# Patient Record
Sex: Male | Born: 1950 | Race: Black or African American | Hispanic: No | Smoking: Current every day smoker
Health system: Southern US, Community
[De-identification: ages and names within clinical notes are randomized; demographics above are authoritative.]

## PROBLEM LIST (undated history)

## (undated) DIAGNOSIS — C801 Malignant (primary) neoplasm, unspecified: Secondary | ICD-10-CM

## (undated) DIAGNOSIS — J189 Pneumonia, unspecified organism: Secondary | ICD-10-CM

## (undated) DIAGNOSIS — N189 Chronic kidney disease, unspecified: Secondary | ICD-10-CM

## (undated) DIAGNOSIS — K08109 Complete loss of teeth, unspecified cause, unspecified class: Secondary | ICD-10-CM

## (undated) DIAGNOSIS — G4733 Obstructive sleep apnea (adult) (pediatric): Secondary | ICD-10-CM

## (undated) DIAGNOSIS — K000 Anodontia: Secondary | ICD-10-CM

## (undated) DIAGNOSIS — D709 Neutropenia, unspecified: Secondary | ICD-10-CM

## (undated) DIAGNOSIS — R569 Unspecified convulsions: Secondary | ICD-10-CM

## (undated) HISTORY — DX: Unspecified convulsions: R56.9

## (undated) HISTORY — PX: TONSILLECTOMY: SUR1361

## (undated) HISTORY — DX: Complete loss of teeth, unspecified cause, unspecified class: K08.109

## (undated) HISTORY — DX: Pneumonia, unspecified organism: J18.9

## (undated) HISTORY — DX: Neutropenia, unspecified: D70.9

## (undated) HISTORY — DX: Obstructive sleep apnea (adult) (pediatric): G47.33

## (undated) HISTORY — DX: Anodontia: K00.0

---

## 2001-10-13 ENCOUNTER — Ambulatory Visit (HOSPITAL_COMMUNITY): Admission: RE | Admit: 2001-10-13 | Discharge: 2001-10-13 | Payer: Self-pay | Admitting: Family Medicine

## 2001-10-13 ENCOUNTER — Encounter: Payer: Self-pay | Admitting: Family Medicine

## 2001-10-19 ENCOUNTER — Encounter: Payer: Self-pay | Admitting: Urology

## 2001-10-19 ENCOUNTER — Ambulatory Visit (HOSPITAL_COMMUNITY): Admission: RE | Admit: 2001-10-19 | Discharge: 2001-10-19 | Payer: Self-pay | Admitting: Urology

## 2002-03-30 ENCOUNTER — Ambulatory Visit (HOSPITAL_COMMUNITY): Admission: RE | Admit: 2002-03-30 | Discharge: 2002-03-30 | Payer: Self-pay | Admitting: General Surgery

## 2004-10-20 ENCOUNTER — Ambulatory Visit (HOSPITAL_COMMUNITY): Admission: RE | Admit: 2004-10-20 | Discharge: 2004-10-20 | Payer: Self-pay | Admitting: Family Medicine

## 2004-10-21 ENCOUNTER — Ambulatory Visit (HOSPITAL_COMMUNITY): Admission: RE | Admit: 2004-10-21 | Discharge: 2004-10-21 | Payer: Self-pay | Admitting: Family Medicine

## 2004-11-11 ENCOUNTER — Ambulatory Visit (HOSPITAL_COMMUNITY): Admission: RE | Admit: 2004-11-11 | Discharge: 2004-11-11 | Payer: Self-pay | Admitting: Cardiothoracic Surgery

## 2004-12-16 ENCOUNTER — Ambulatory Visit (HOSPITAL_COMMUNITY): Admission: RE | Admit: 2004-12-16 | Discharge: 2004-12-16 | Payer: Self-pay | Admitting: Cardiothoracic Surgery

## 2005-03-16 ENCOUNTER — Ambulatory Visit (HOSPITAL_COMMUNITY): Admission: RE | Admit: 2005-03-16 | Discharge: 2005-03-16 | Payer: Self-pay | Admitting: Family Medicine

## 2005-07-14 IMAGING — CR DG CHEST 2V
2 series · 2 of 2 positions shown · non-contrast
Comparison: none

HISTORY: Dyspnea, pulmonary infiltrate, followup

CHEST 2 VIEWS:
Comparison 11/11/2004, 10/20/2004
Upper normal heart size.
Normal mediastinal contours and vascularity.
Resolved right upper lobe infiltrate.
Underlying COPD.
No effusion or pneumothorax.
Old left rib fractures.

[view not recorded (1 of 2)]
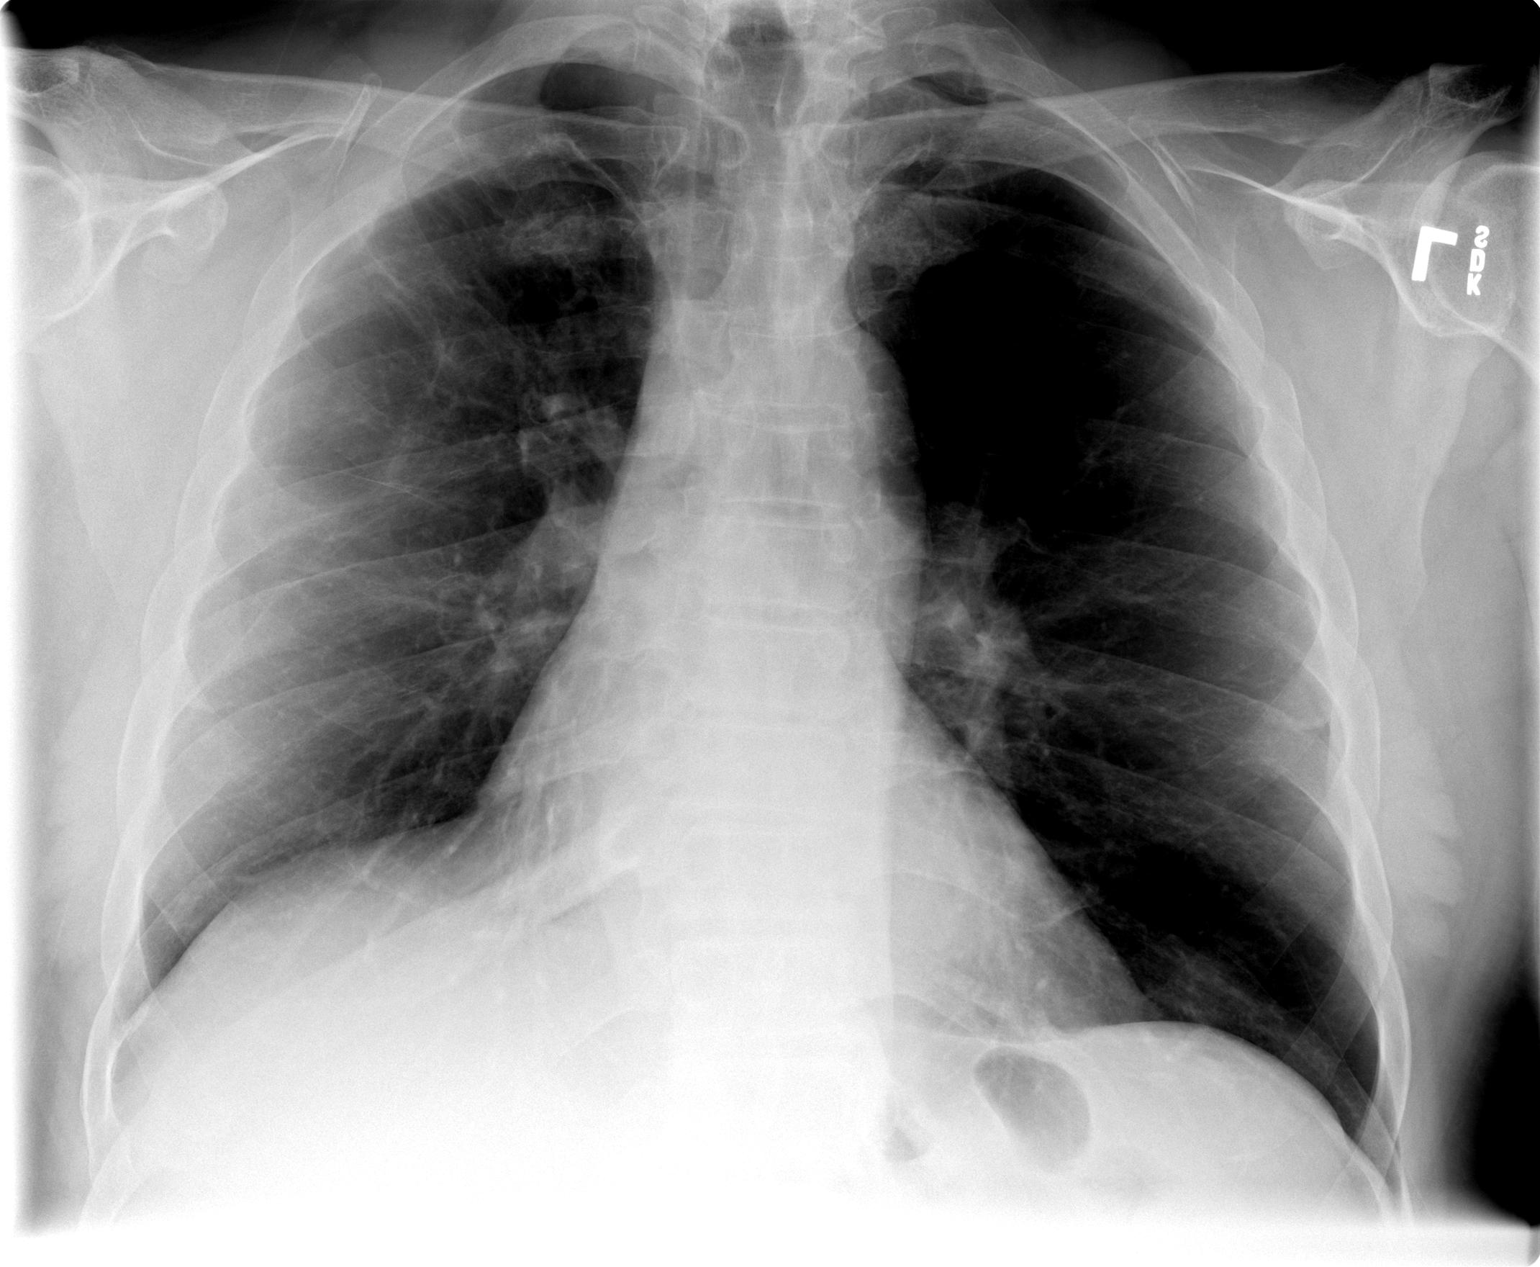

[view not recorded (2 of 2)]
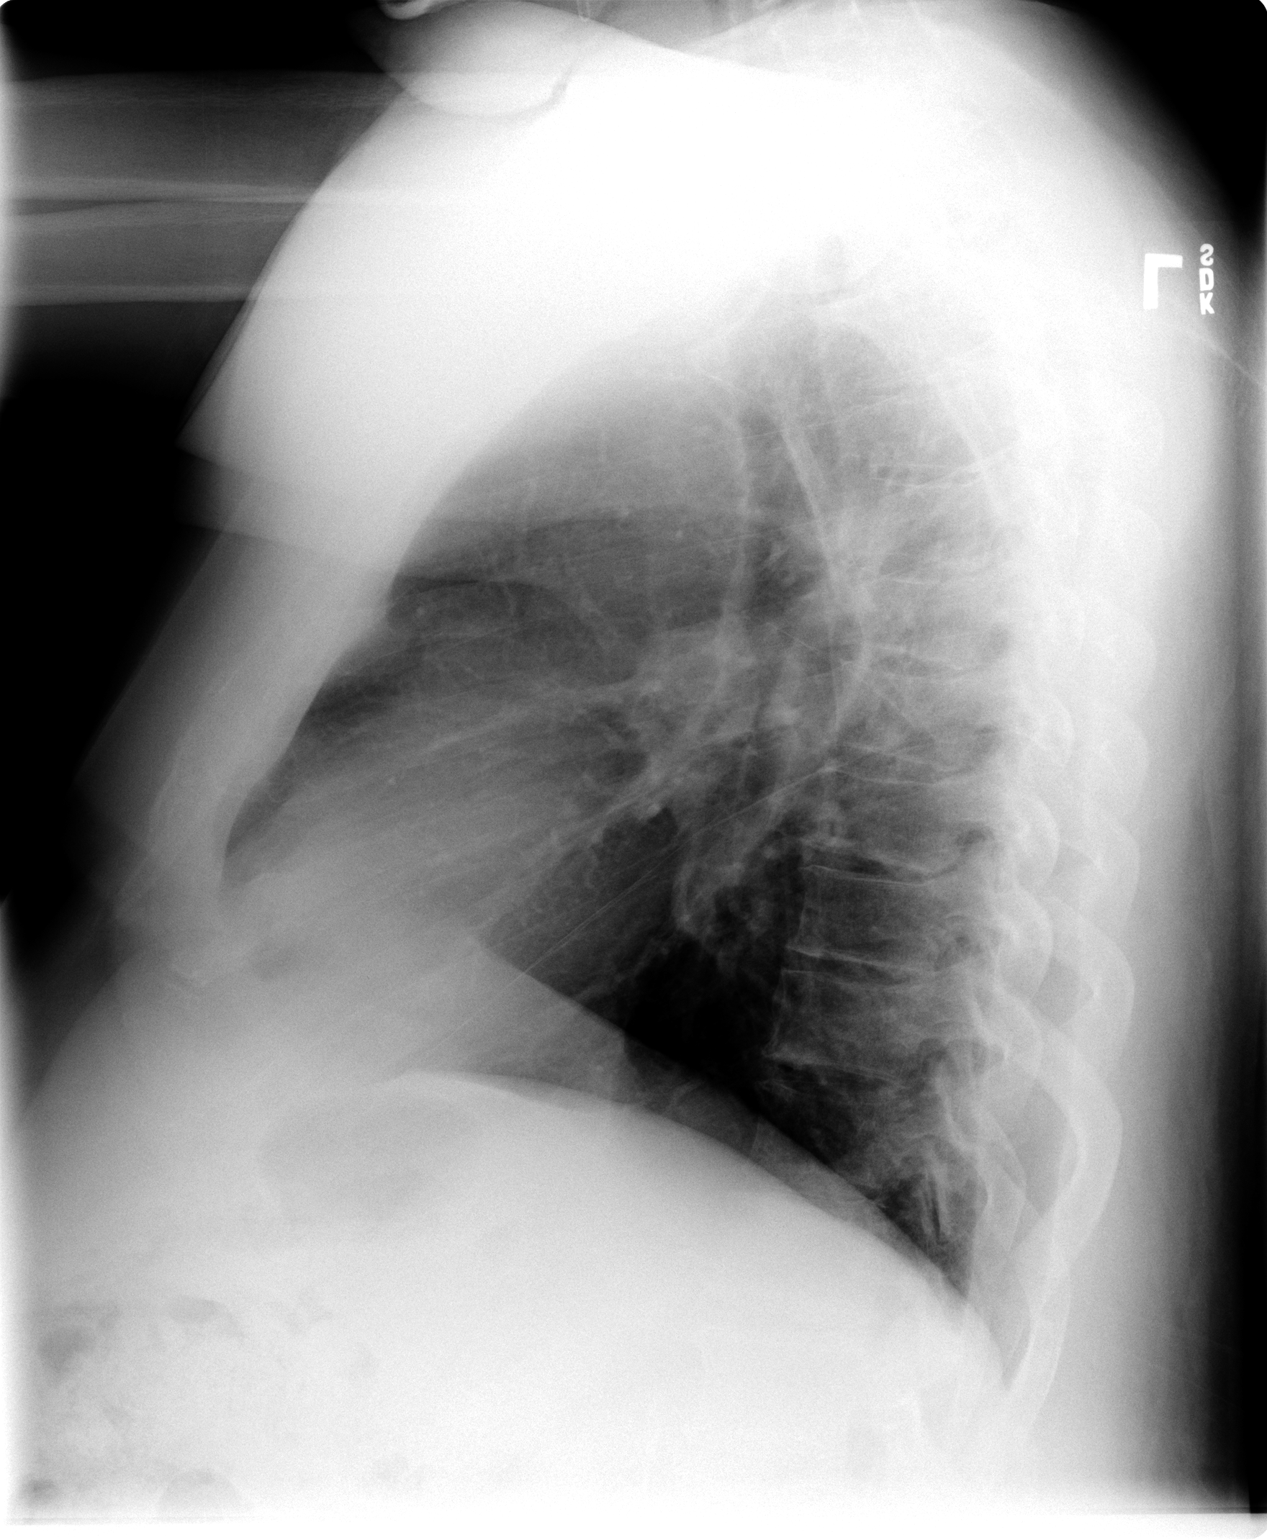

[2 of 2 positions shown; findings below may reference images not displayed]

IMPRESSION: Resolved right upper lobe infiltrate with residual scarring.
Underlying COPD.

## 2009-02-06 ENCOUNTER — Ambulatory Visit (HOSPITAL_COMMUNITY): Admission: RE | Admit: 2009-02-06 | Discharge: 2009-02-06 | Payer: Self-pay | Admitting: Family Medicine

## 2009-04-03 ENCOUNTER — Encounter (HOSPITAL_COMMUNITY): Admission: RE | Admit: 2009-04-03 | Discharge: 2009-05-03 | Payer: Self-pay | Admitting: Oncology

## 2009-04-03 ENCOUNTER — Ambulatory Visit (HOSPITAL_COMMUNITY): Payer: Self-pay | Admitting: Oncology

## 2009-09-30 ENCOUNTER — Ambulatory Visit (HOSPITAL_COMMUNITY): Payer: Self-pay | Admitting: Oncology

## 2009-09-30 ENCOUNTER — Encounter (HOSPITAL_COMMUNITY): Admission: RE | Admit: 2009-09-30 | Discharge: 2009-10-30 | Payer: Self-pay | Admitting: Oncology

## 2010-04-01 ENCOUNTER — Ambulatory Visit (HOSPITAL_COMMUNITY): Payer: Self-pay | Admitting: Oncology

## 2010-04-01 ENCOUNTER — Encounter (HOSPITAL_COMMUNITY): Admission: RE | Admit: 2010-04-01 | Discharge: 2010-05-01 | Payer: Self-pay | Admitting: Oncology

## 2010-08-21 ENCOUNTER — Ambulatory Visit (HOSPITAL_COMMUNITY)
Admission: RE | Admit: 2010-08-21 | Discharge: 2010-08-21 | Payer: Self-pay | Source: Home / Self Care | Attending: Family Medicine | Admitting: Family Medicine

## 2010-10-05 ENCOUNTER — Encounter: Payer: Self-pay | Admitting: Cardiothoracic Surgery

## 2010-10-05 ENCOUNTER — Encounter: Payer: Self-pay | Admitting: Family Medicine

## 2010-11-29 LAB — CBC
HCT: 43.7 % (ref 39.0–52.0)
MCH: 34.7 pg — ABNORMAL HIGH (ref 26.0–34.0)
MCHC: 34.3 g/dL (ref 30.0–36.0)
MCV: 101.2 fL — ABNORMAL HIGH (ref 78.0–100.0)
RDW: 14.4 % (ref 11.5–15.5)

## 2010-11-29 LAB — DIFFERENTIAL
Basophils Absolute: 0 10*3/uL (ref 0.0–0.1)
Eosinophils Relative: 7 % — ABNORMAL HIGH (ref 0–5)
Lymphocytes Relative: 57 % — ABNORMAL HIGH (ref 12–46)
Neutro Abs: 0.7 10*3/uL — ABNORMAL LOW (ref 1.7–7.7)

## 2010-11-30 LAB — DIFFERENTIAL
Basophils Absolute: 0.1 10*3/uL (ref 0.0–0.1)
Basophils Relative: 1 % (ref 0–1)
Eosinophils Absolute: 0.3 10*3/uL (ref 0.0–0.7)
Lymphocytes Relative: 60 % — ABNORMAL HIGH (ref 12–46)
Neutrophils Relative %: 13 % — ABNORMAL LOW (ref 43–77)

## 2010-11-30 LAB — CBC
Hemoglobin: 15.6 g/dL (ref 13.0–17.0)
Platelets: 165 10*3/uL (ref 150–400)
RDW: 14.1 % (ref 11.5–15.5)

## 2010-12-21 LAB — IRON AND TIBC
Saturation Ratios: 72 % — ABNORMAL HIGH (ref 20–55)
TIBC: 271 ug/dL (ref 215–435)

## 2010-12-21 LAB — RETICULOCYTES: Retic Count, Absolute: 33.3 10*3/uL (ref 19.0–186.0)

## 2010-12-21 LAB — DIFFERENTIAL
Basophils Absolute: 0.1 10*3/uL (ref 0.0–0.1)
Eosinophils Absolute: 0.3 10*3/uL (ref 0.0–0.7)
Lymphocytes Relative: 59 % — ABNORMAL HIGH (ref 12–46)
Neutrophils Relative %: 14 % — ABNORMAL LOW (ref 43–77)

## 2010-12-21 LAB — VITAMIN B12: Vitamin B-12: 499 pg/mL (ref 211–911)

## 2010-12-21 LAB — CBC
Platelets: 166 10*3/uL (ref 150–400)
WBC: 5.8 10*3/uL (ref 4.0–10.5)

## 2011-01-12 ENCOUNTER — Encounter (HOSPITAL_COMMUNITY): Payer: PRIVATE HEALTH INSURANCE | Attending: Oncology

## 2011-01-12 DIAGNOSIS — D709 Neutropenia, unspecified: Secondary | ICD-10-CM

## 2011-01-13 ENCOUNTER — Encounter (HOSPITAL_COMMUNITY): Payer: PRIVATE HEALTH INSURANCE | Attending: Oncology | Admitting: Oncology

## 2011-01-13 DIAGNOSIS — D709 Neutropenia, unspecified: Secondary | ICD-10-CM | POA: Insufficient documentation

## 2012-01-11 ENCOUNTER — Encounter (HOSPITAL_COMMUNITY): Payer: PRIVATE HEALTH INSURANCE | Attending: Oncology

## 2012-01-11 DIAGNOSIS — D709 Neutropenia, unspecified: Secondary | ICD-10-CM | POA: Insufficient documentation

## 2012-01-11 LAB — CBC
HCT: 42.8 % (ref 39.0–52.0)
MCV: 97.3 fL (ref 78.0–100.0)
Platelets: 160 10*3/uL (ref 150–400)
RBC: 4.4 MIL/uL (ref 4.22–5.81)
WBC: 4.4 10*3/uL (ref 4.0–10.5)

## 2012-01-11 LAB — DIFFERENTIAL
Eosinophils Relative: 7 % — ABNORMAL HIGH (ref 0–5)
Lymphocytes Relative: 63 % — ABNORMAL HIGH (ref 12–46)
Lymphs Abs: 2.8 10*3/uL (ref 0.7–4.0)
Monocytes Absolute: 0.7 10*3/uL (ref 0.1–1.0)
Neutro Abs: 0.7 10*3/uL — ABNORMAL LOW (ref 1.7–7.7)

## 2012-01-11 NOTE — Progress Notes (Signed)
Lab draw

## 2012-01-13 ENCOUNTER — Ambulatory Visit (HOSPITAL_COMMUNITY): Payer: PRIVATE HEALTH INSURANCE | Admitting: Oncology

## 2012-01-14 ENCOUNTER — Encounter (HOSPITAL_COMMUNITY): Payer: PRIVATE HEALTH INSURANCE | Attending: Hematology and Oncology

## 2012-01-14 ENCOUNTER — Encounter (HOSPITAL_COMMUNITY): Payer: Self-pay

## 2012-01-14 VITALS — BP 145/77 | HR 65 | Temp 97.4°F | Ht 71.0 in | Wt 238.2 lb

## 2012-01-14 DIAGNOSIS — D709 Neutropenia, unspecified: Secondary | ICD-10-CM | POA: Insufficient documentation

## 2012-01-14 DIAGNOSIS — G40909 Epilepsy, unspecified, not intractable, without status epilepticus: Secondary | ICD-10-CM | POA: Insufficient documentation

## 2012-01-14 DIAGNOSIS — D708 Other neutropenia: Secondary | ICD-10-CM

## 2012-01-14 NOTE — Progress Notes (Signed)
CC:   David [name], M.D.  IDENTIFYING STATEMENT:  Patient is a 61 year old gentleman who presents for followup.  INTERVAL HISTORY:  The patient was seen a year ago.  He follows for neutropenia.  He has had no issues or concerns since his last visit.  He denies infections.  He is attempting to lose weight and appears to be successful.  He is exercising and eats a well-balanced diet.  MEDICATIONS:  Reviewed and updated.  PHYSICAL EXAM:  General:  Alert and oriented x3.  Vitals: Pulse 65, blood pressure 145/77, temperature 97.4, respirations 18, weight 238 pounds.  HEENT:  Head is atraumatic, normocephalic.  Sclerae anicteric. Mouth moist.  Chest:  Clear.  CVS:  Unremarkable.  Abdomen:  Soft, nontender.  Bowel sounds present.  Extremities:  No calf tenderness.  LAB DATA:  CBC 01/11/2012: White cell count 4.4, hemoglobin 15, hematocrit 42.8, platelets 160, ANC 700 (a year ago).  IMPRESSION AND PLAN:  Mr. 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  He is of [place] American ancestry thus, this may likely be hereditary.  He is asymptomatic.  He was reassured.  He would like Hematology to continue monitoring his white blood cell counts, and thus I have him returning in a year's time.  He is not to hesitate to call with concerns.    ______________________________ Laurice Record, M.D. LIO/MEDQ  D:  01/14/2012  T:  01/14/2012  Job:  [id number]

## 2012-01-14 NOTE — Progress Notes (Signed)
This office note has been dictated.

## 2012-01-14 NOTE — Patient Instructions (Signed)
Patient to follow up as instructed.   Current Outpatient Prescriptions  Medication Sig Dispense Refill  . calcium carbonate (OS-CAL) 600 MG TABS Take 600 mg by mouth 2 (two) times daily with a meal. Calcium 600mg  with Vitamin D      . carbamazepine (TEGRETOL) 200 MG tablet Take 1,000 mg by mouth daily.      . Multiple Vitamin (MULITIVITAMIN WITH MINERALS) TABS Take 1 tablet by mouth daily.      Marland Kitchen PHENObarbital (LUMINAL) 100 MG tablet Take 100 mg by mouth at bedtime.            May 2013  Sunday Monday Tuesday Wednesday Thursday Friday Saturday            1   2   OFFICE VISIT 30  10:30 AM  (30 min.)  [facility] Covering Provider  [facility] 3   4    5   6   7   8   9   10   11    12   13   14   15   16   17   18    19   20   21   22   23   24   25    26   27   28   29   30    31

## 2012-05-19 NOTE — H&P (Signed)
NTS SOAP Note  Vital Signs:  Vitals as of: 05/19/2012: Systolic 145: Diastolic 89: Heart Rate 77: Temp 98.56F: Height 6ft 11.5in: Weight 224Lbs 0 Ounces: BMI 31  BMI : 30.81 kg/m2  Subjective: This 32 Years 49 Months old Male presents for screening TCS.  Last had a TCS over ten years ago.  No gi complaints.  No family h/o colon cancer.   Review of Symptoms:  Constitutional:unremarkable   Head:unremarkable    Eyes:unremarkable   Nose/Mouth/Throat:unremarkable Cardiovascular:  unremarkable   Respiratory:unremarkable   Gastrointestinal:  unremarkable   Genitourinary:unremarkable     Musculoskeletal:unremarkable   Skin:unremarkable Hematolgic/Lymphatic:unremarkable     Allergic/Immunologic:unremarkable     Past Medical History:    Reviewed   Past Medical History  Surgical History: unremarkable Medical Problems: seizures Allergies: nkda Medications: tegretol, phenobarbital, mvi, b12   Social History:Reviewed  Social History  Preferred Language: English ([place]) Race:  Black or African American Ethnicity: Not Hispanic / Latino Age: 61 Years 5 Months Marital Status:  S Alcohol:  No Recreational drug(s):  No   Smoking Status: Current every day smoker reviewed on 05/19/2012 Started Date: 09/14/1968 Packs per day: 1.00   Family History:  Reviewed   Family History  Is there a family history of:no family h/o colon cancer    Objective Information: General:  Well appearing, well nourished in no distress. Head:Atraumatic; no masses; no abnormalities Heart:  RRR, no murmur Lungs:    CTA bilaterally, no wheezes, rhonchi, rales.  Breathing unlabored. Abdomen:Soft, NT/ND, no HSM, no masses.   deferred to procedure  Assessment:Need for screening TCS  Diagnosis &amp; Procedure: DiagnosisCode: V76.51, ProcedureCode: 16109,    Plan:Scheduled for TCS on 05/31/12.   Patient  Education:Alternative treatments to surgery were discussed with patient (and family).  Risks and benefits  of procedure were fully explained to the patient (and family) who gave informed consent. Patient/family questions were addressed.  Follow-up:Pending Surgery

## 2012-05-20 ENCOUNTER — Encounter (HOSPITAL_COMMUNITY): Payer: Self-pay | Admitting: Pharmacy Technician

## 2012-05-31 ENCOUNTER — Ambulatory Visit (HOSPITAL_COMMUNITY)
Admission: RE | Admit: 2012-05-31 | Discharge: 2012-05-31 | Disposition: A | Discharge status: Home / Self Care | Payer: Medicare Other | Source: Ambulatory Visit | Attending: General Surgery | Admitting: General Surgery

## 2012-05-31 ENCOUNTER — Encounter (HOSPITAL_COMMUNITY): Payer: Self-pay | Admitting: *Deleted

## 2012-05-31 ENCOUNTER — Encounter (HOSPITAL_COMMUNITY)
Admission: RE | Disposition: A | Discharge status: Home / Self Care | Payer: Self-pay | Source: Ambulatory Visit | Attending: General Surgery

## 2012-05-31 DIAGNOSIS — Z1211 Encounter for screening for malignant neoplasm of colon: Secondary | ICD-10-CM | POA: Insufficient documentation

## 2012-05-31 HISTORY — PX: COLONOSCOPY: SHX5424

## 2012-05-31 SURGERY — COLONOSCOPY
Anesthesia: Moderate Sedation

## 2012-05-31 MED ORDER — MIDAZOLAM HCL 5 MG/5ML IJ SOLN
INTRAMUSCULAR | Status: AC
  Filled 2012-05-31: qty 10

## 2012-05-31 MED ORDER — SODIUM CHLORIDE 0.45 % IV SOLN
Freq: Once | INTRAVENOUS | Status: AC
  Administered 2012-05-31: 1000 mL via INTRAVENOUS

## 2012-05-31 MED ORDER — MIDAZOLAM HCL 5 MG/5ML IJ SOLN
INTRAMUSCULAR | Status: DC | PRN
  Administered 2012-05-31: 3 mg via INTRAVENOUS

## 2012-05-31 MED ORDER — MEPERIDINE HCL 25 MG/ML IJ SOLN
INTRAMUSCULAR | Status: DC | PRN
  Administered 2012-05-31: 50 mg via INTRAVENOUS

## 2012-05-31 MED ORDER — SODIUM CHLORIDE 0.9 % IV SOLN: INTRAVENOUS | Status: DC

## 2012-05-31 MED ORDER — MEPERIDINE HCL 50 MG/ML IJ SOLN
INTRAMUSCULAR | Status: AC
  Filled 2012-05-31: qty 1

## 2012-05-31 MED ORDER — STERILE WATER FOR IRRIGATION IR SOLN
Status: DC | PRN
  Administered 2012-05-31: 10:00:00

## 2012-05-31 NOTE — Interval H&P Note (Signed)
History and Physical Interval Note:  05/31/2012 9:58 AM  [name]  has presented today for surgery, with the diagnosis of Special screening for malignant neoplasms, colon [V76.51]  The various methods of treatment have been discussed with the patient and family. After consideration of risks, benefits and other options for treatment, the patient has consented to  Procedure(s) (LRB) with comments: COLONOSCOPY (N/[name]) as [name] surgical intervention .  The patient's history has been reviewed, patient examined, no change in status, stable for surgery.  I have reviewed the patient's chart and labs.  Questions were answered to the patient's satisfaction.     David [name]

## 2012-05-31 NOTE — Op Note (Signed)
[facility] [address] [place], [address]   COLONOSCOPY PROCEDURE REPORT  PATIENT: David [name], David [name]  MR#: [id number] BIRTHDATE: [birth year] , 61  yrs. old GENDER: Male ENDOSCOPIST: Franky Macho, MD REFERRED NF:AOZHYQM, William PROCEDURE DATE:  05/31/2012 PROCEDURE:   Colonoscopy, screening ASA CLASS:   Class II INDICATIONS:average risk patient for colon cancer. MEDICATIONS: Versed 3 mg IV and Demerol 50 mg IV  DESCRIPTION OF PROCEDURE:   After the risks benefits and alternatives of the procedure were thoroughly explained, informed consent was obtained.  A digital rectal exam revealed no abnormalities of the rectum.   The Pentax Colonoscope [id number] endoscope was introduced through the anus and advanced to the cecum, which was identified by both the appendix and ileocecal valve. No adverse events experienced.   The quality of the prep was adequate, using MoviPrep  The instrument was then slowly withdrawn as the colon was fully examined.      COLON FINDINGS: A normal appearing cecum, ileocecal valve, and appendiceal orifice were identified.  The ascending, hepatic flexure, transverse, splenic flexure, descending, sigmoid colon and rectum appeared unremarkable.  No polyps or cancers were seen. Retroflexed views revealed no abnormalities. The time to cecum=4 minutes 0 seconds.  Withdrawal time=3 minutes 0 seconds.  The scope was withdrawn and the procedure completed. COMPLICATIONS: There were no complications.  ENDOSCOPIC IMPRESSION: Normal colon  RECOMMENDATIONS: repeat Colonscopy in 10 years.   eSigned:  Franky Macho, MD 05/31/2012 10:33 AM   cc:

## 2012-06-03 ENCOUNTER — Encounter (HOSPITAL_COMMUNITY): Payer: Self-pay | Admitting: General Surgery

## 2013-01-11 ENCOUNTER — Encounter (HOSPITAL_COMMUNITY): Payer: Medicare Other | Attending: Oncology

## 2013-01-11 DIAGNOSIS — D708 Other neutropenia: Secondary | ICD-10-CM

## 2013-01-11 DIAGNOSIS — D709 Neutropenia, unspecified: Secondary | ICD-10-CM | POA: Insufficient documentation

## 2013-01-11 LAB — CBC WITH DIFFERENTIAL/PLATELET
Eosinophils Relative: 3 % (ref 0–5)
HCT: 44.1 % (ref 39.0–52.0)
Hemoglobin: 16.1 g/dL (ref 13.0–17.0)
Lymphocytes Relative: 60 % — ABNORMAL HIGH (ref 12–46)
Lymphs Abs: 3 10*3/uL (ref 0.7–4.0)
MCV: 95.5 fL (ref 78.0–100.0)
Monocytes Relative: 15 % — ABNORMAL HIGH (ref 3–12)
Platelets: 181 10*3/uL (ref 150–400)
RBC: 4.62 MIL/uL (ref 4.22–5.81)
WBC: 5.1 10*3/uL (ref 4.0–10.5)

## 2013-01-11 LAB — BASIC METABOLIC PANEL
CO2: 28 mEq/L (ref 19–32)
Calcium: 9.2 mg/dL (ref 8.4–10.5)
Chloride: 102 mEq/L (ref 96–112)
Potassium: 4.4 mEq/L (ref 3.5–5.1)
Sodium: 138 mEq/L (ref 135–145)

## 2013-01-11 NOTE — Progress Notes (Signed)
Labs drawn today for cbc/diff,bmp 

## 2013-01-13 ENCOUNTER — Encounter (HOSPITAL_COMMUNITY): Payer: Self-pay | Admitting: Dietician

## 2013-01-13 ENCOUNTER — Encounter (HOSPITAL_COMMUNITY): Payer: Medicare Other | Attending: Oncology | Admitting: Oncology

## 2013-01-13 ENCOUNTER — Encounter (HOSPITAL_COMMUNITY): Payer: Self-pay | Admitting: Oncology

## 2013-01-13 VITALS — BP 143/73 | HR 62 | Temp 98.1°F | Resp 18 | Wt 221.8 lb

## 2013-01-13 DIAGNOSIS — D709 Neutropenia, unspecified: Secondary | ICD-10-CM

## 2013-01-13 NOTE — Progress Notes (Signed)
#  1 neutropenia in the setting of a normal white count with no increase in infections or requirement for antibiotics. #2 seizure disorder times many years on Tegretol #3 excess weight but he is working on that.  This gentleman takes both Tegretol as well as phenobarbital for seizure disorder. He has been followed here for neutropenia in the setting of a normal white count. Interestingly last year he was told to take B12 over-the-counter by mouth once a week. We did check his B12 and folic acid levels in years past and they were normal. Over the last couple years however his red cell size has increased to above the upper range of normal.  Since starting the oral B12 his MCV is now normal.  He has not had any increase in infections over the last year nor has he used any antibiotics.  He thinks in retrospect that his father may have had an abnormal blood count. He is not aware of anyone else without history.  Since he is very stable from white count standpoint over the last 4-5 years I think we can release him but since his MCV has changed with your B12 I will send off parietal cell antibodies and intrinsic factor antibodies to play it safe. If they are fine we will let him know and release of this clinic to be here on a when necessary basis going forward.

## 2013-01-13 NOTE — Progress Notes (Signed)
Pt identified for weight loss on [facility] Nutrition Screen.   Wt Readings from Last 10 Encounters:  01/13/13 221 lb 12.8 oz (100.608 kg)  05/31/12 224 lb (101.606 kg)  05/31/12 224 lb (101.606 kg)  01/14/12 238 lb 3.2 oz (108.047 kg)   Chart reviewed. Pt is followed by [facility] for neutropenia.  Wt hx reveals a 17# (7.1%) wt loss x 1 year, which is not clinically significant. Chart review reveals that pt has been intentionally losing weight via lifestyle change (healthy diet and exercise). Given most BMI of 30.51, which meet criteria for obesity, class I, slow, moderate weight loss is desirable to improve overall healthy and reduce the risk of developing chronic disease.  No nutrition recommendations at this time. If nutrition issues arise, please consult RD.  Melody Haver, RD, LDN Pager: [phone]

## 2013-01-13 NOTE — Patient Instructions (Addendum)
.  [facility] Discharge Instructions  RECOMMENDATIONS MADE BY THE CONSULTANT AND ANY TEST RESULTS WILL BE SENT TO YOUR REFERRING PHYSICIAN.  EXAM FINDINGS BY THE PHYSICIAN TODAY AND SIGNS OR SYMPTOMS TO REPORT TO CLINIC OR PRIMARY PHYSICIAN: Exam very good. We are going to take an extra lab test today to look into why you need B12   SPECIAL INSTRUCTIONS/FOLLOW-UP: Just call us if needed..  Thank you for choosing [name] to provide your oncology and hematology care.  To afford each patient quality time with our providers, please arrive at least 15 minutes before your scheduled appointment time.  With your help, our goal is to use those 15 minutes to complete the necessary work-up to ensure our physicians have the information they need to help with your evaluation and healthcare recommendations.    Effective January 1st, 2014, we ask that you re-schedule your appointment with our physicians should you arrive 10 or more minutes late for your appointment.  We strive to give you quality time with our providers, and arriving late affects you and other patients whose appointments are after yours.    Again, thank you for choosing [facility].  Our hope is that these requests will decrease the amount of time that you wait before being seen by our physicians.       _____________________________________________________________  Should you have questions after your visit to [facility], please contact our office at [phone] between the hours of 8:30 a.m. and 5:00 p.m.  Voicemails left after 4:30 p.m. will not be returned until the following business day.  For prescription refill requests, have your pharmacy contact our office with your prescription refill request.

## 2013-01-15 LAB — INTRINSIC FACTOR ANTIBODIES: Intrinsic Factor: NEGATIVE

## 2013-01-16 LAB — ANTI-PARIETAL ANTIBODY: Parietal Cell Antibody-IgG: NEGATIVE

## 2015-09-20 DIAGNOSIS — N529 Male erectile dysfunction, unspecified: Secondary | ICD-10-CM | POA: Diagnosis not present

## 2015-09-20 DIAGNOSIS — N400 Enlarged prostate without lower urinary tract symptoms: Secondary | ICD-10-CM | POA: Diagnosis not present

## 2015-09-20 DIAGNOSIS — Z1389 Encounter for screening for other disorder: Secondary | ICD-10-CM | POA: Diagnosis not present

## 2015-09-20 DIAGNOSIS — E6609 Other obesity due to excess calories: Secondary | ICD-10-CM | POA: Diagnosis not present

## 2015-09-20 DIAGNOSIS — Z6832 Body mass index (BMI) 32.0-32.9, adult: Secondary | ICD-10-CM | POA: Diagnosis not present

## 2015-11-21 DIAGNOSIS — R7309 Other abnormal glucose: Secondary | ICD-10-CM | POA: Diagnosis not present

## 2015-11-21 DIAGNOSIS — Z681 Body mass index (BMI) 19 or less, adult: Secondary | ICD-10-CM | POA: Diagnosis not present

## 2015-11-21 DIAGNOSIS — I10 Essential (primary) hypertension: Secondary | ICD-10-CM | POA: Diagnosis not present

## 2015-11-21 DIAGNOSIS — Z1389 Encounter for screening for other disorder: Secondary | ICD-10-CM | POA: Diagnosis not present

## 2015-11-21 DIAGNOSIS — Z0001 Encounter for general adult medical examination with abnormal findings: Secondary | ICD-10-CM | POA: Diagnosis not present

## 2015-11-21 DIAGNOSIS — Z0000 Encounter for general adult medical examination without abnormal findings: Secondary | ICD-10-CM | POA: Diagnosis not present

## 2016-02-21 DIAGNOSIS — E6609 Other obesity due to excess calories: Secondary | ICD-10-CM | POA: Diagnosis not present

## 2016-02-21 DIAGNOSIS — Z6834 Body mass index (BMI) 34.0-34.9, adult: Secondary | ICD-10-CM | POA: Diagnosis not present

## 2016-02-21 DIAGNOSIS — H44702 Unspecified retained (old) intraocular foreign body, nonmagnetic, left eye: Secondary | ICD-10-CM | POA: Diagnosis not present

## 2016-02-21 DIAGNOSIS — S0502XA Injury of conjunctiva and corneal abrasion without foreign body, left eye, initial encounter: Secondary | ICD-10-CM | POA: Diagnosis not present

## 2016-02-21 DIAGNOSIS — Z1389 Encounter for screening for other disorder: Secondary | ICD-10-CM | POA: Diagnosis not present

## 2016-02-25 DIAGNOSIS — Z79899 Other long term (current) drug therapy: Secondary | ICD-10-CM | POA: Diagnosis not present

## 2016-02-25 DIAGNOSIS — G4710 Hypersomnia, unspecified: Secondary | ICD-10-CM | POA: Diagnosis not present

## 2016-02-25 DIAGNOSIS — G40309 Generalized idiopathic epilepsy and epileptic syndromes, not intractable, without status epilepticus: Secondary | ICD-10-CM | POA: Diagnosis not present

## 2016-02-25 DIAGNOSIS — E6609 Other obesity due to excess calories: Secondary | ICD-10-CM | POA: Diagnosis not present

## 2016-02-25 DIAGNOSIS — D696 Thrombocytopenia, unspecified: Secondary | ICD-10-CM | POA: Diagnosis not present

## 2016-08-25 DIAGNOSIS — Z79899 Other long term (current) drug therapy: Secondary | ICD-10-CM | POA: Diagnosis not present

## 2016-08-25 DIAGNOSIS — G4710 Hypersomnia, unspecified: Secondary | ICD-10-CM | POA: Diagnosis not present

## 2016-08-25 DIAGNOSIS — G40309 Generalized idiopathic epilepsy and epileptic syndromes, not intractable, without status epilepticus: Secondary | ICD-10-CM | POA: Diagnosis not present

## 2016-08-25 DIAGNOSIS — D696 Thrombocytopenia, unspecified: Secondary | ICD-10-CM | POA: Diagnosis not present

## 2016-08-25 DIAGNOSIS — E6609 Other obesity due to excess calories: Secondary | ICD-10-CM | POA: Diagnosis not present

## 2016-09-01 DIAGNOSIS — G40309 Generalized idiopathic epilepsy and epileptic syndromes, not intractable, without status epilepticus: Secondary | ICD-10-CM | POA: Diagnosis not present

## 2016-11-30 DIAGNOSIS — N400 Enlarged prostate without lower urinary tract symptoms: Secondary | ICD-10-CM | POA: Diagnosis not present

## 2016-11-30 DIAGNOSIS — R7309 Other abnormal glucose: Secondary | ICD-10-CM | POA: Diagnosis not present

## 2016-11-30 DIAGNOSIS — Z6834 Body mass index (BMI) 34.0-34.9, adult: Secondary | ICD-10-CM | POA: Diagnosis not present

## 2016-11-30 DIAGNOSIS — Z1389 Encounter for screening for other disorder: Secondary | ICD-10-CM | POA: Diagnosis not present

## 2016-11-30 DIAGNOSIS — R569 Unspecified convulsions: Secondary | ICD-10-CM | POA: Diagnosis not present

## 2016-11-30 DIAGNOSIS — Z0000 Encounter for general adult medical examination without abnormal findings: Secondary | ICD-10-CM | POA: Diagnosis not present

## 2017-02-22 DIAGNOSIS — G40309 Generalized idiopathic epilepsy and epileptic syndromes, not intractable, without status epilepticus: Secondary | ICD-10-CM | POA: Diagnosis not present

## 2017-02-22 DIAGNOSIS — E6609 Other obesity due to excess calories: Secondary | ICD-10-CM | POA: Diagnosis not present

## 2017-02-22 DIAGNOSIS — Z79899 Other long term (current) drug therapy: Secondary | ICD-10-CM | POA: Diagnosis not present

## 2017-02-22 DIAGNOSIS — G4710 Hypersomnia, unspecified: Secondary | ICD-10-CM | POA: Diagnosis not present

## 2017-09-09 DIAGNOSIS — T50905A Adverse effect of unspecified drugs, medicaments and biological substances, initial encounter: Secondary | ICD-10-CM | POA: Diagnosis not present

## 2017-09-09 DIAGNOSIS — G4710 Hypersomnia, unspecified: Secondary | ICD-10-CM | POA: Diagnosis not present

## 2017-09-09 DIAGNOSIS — Z79899 Other long term (current) drug therapy: Secondary | ICD-10-CM | POA: Diagnosis not present

## 2017-09-09 DIAGNOSIS — G40309 Generalized idiopathic epilepsy and epileptic syndromes, not intractable, without status epilepticus: Secondary | ICD-10-CM | POA: Diagnosis not present

## 2017-12-02 DIAGNOSIS — Z0001 Encounter for general adult medical examination with abnormal findings: Secondary | ICD-10-CM | POA: Diagnosis not present

## 2017-12-02 DIAGNOSIS — R7309 Other abnormal glucose: Secondary | ICD-10-CM | POA: Diagnosis not present

## 2017-12-02 DIAGNOSIS — G40309 Generalized idiopathic epilepsy and epileptic syndromes, not intractable, without status epilepticus: Secondary | ICD-10-CM | POA: Diagnosis not present

## 2017-12-02 DIAGNOSIS — Z23 Encounter for immunization: Secondary | ICD-10-CM | POA: Diagnosis not present

## 2017-12-02 DIAGNOSIS — N400 Enlarged prostate without lower urinary tract symptoms: Secondary | ICD-10-CM | POA: Diagnosis not present

## 2017-12-02 DIAGNOSIS — Z1389 Encounter for screening for other disorder: Secondary | ICD-10-CM | POA: Diagnosis not present

## 2017-12-02 DIAGNOSIS — Z6835 Body mass index (BMI) 35.0-35.9, adult: Secondary | ICD-10-CM | POA: Diagnosis not present

## 2017-12-02 DIAGNOSIS — Z136 Encounter for screening for cardiovascular disorders: Secondary | ICD-10-CM | POA: Diagnosis not present

## 2018-01-06 DIAGNOSIS — R945 Abnormal results of liver function studies: Secondary | ICD-10-CM | POA: Diagnosis not present

## 2018-01-06 DIAGNOSIS — Z6835 Body mass index (BMI) 35.0-35.9, adult: Secondary | ICD-10-CM | POA: Diagnosis not present

## 2018-01-06 DIAGNOSIS — E6609 Other obesity due to excess calories: Secondary | ICD-10-CM | POA: Diagnosis not present

## 2018-01-27 ENCOUNTER — Ambulatory Visit (INDEPENDENT_AMBULATORY_CARE_PROVIDER_SITE_OTHER): Payer: Medicare PPO | Admitting: Internal Medicine

## 2018-01-27 ENCOUNTER — Telehealth (INDEPENDENT_AMBULATORY_CARE_PROVIDER_SITE_OTHER): Payer: Self-pay | Admitting: Internal Medicine

## 2018-01-27 ENCOUNTER — Encounter (INDEPENDENT_AMBULATORY_CARE_PROVIDER_SITE_OTHER): Payer: Self-pay | Admitting: Internal Medicine

## 2018-01-27 VITALS — BP 160/84 | HR 98 | Temp 97.9°F | Ht 71.5 in | Wt 251.5 lb

## 2018-01-27 DIAGNOSIS — B182 Chronic viral hepatitis C: Secondary | ICD-10-CM | POA: Diagnosis not present

## 2018-01-27 DIAGNOSIS — Z5181 Encounter for therapeutic drug level monitoring: Secondary | ICD-10-CM | POA: Diagnosis not present

## 2018-01-27 NOTE — Telephone Encounter (Signed)
Patient left message for you to call him at [phone]

## 2018-01-27 NOTE — Patient Instructions (Signed)
Labs today

## 2018-01-27 NOTE — Progress Notes (Signed)
   Subjective:    Patient ID: David [name], male    DOB: [birth year], 67 y.o.   MRN: [id number]  HPI Referred by Dr. Hilma Favors for positive Hep C. Past hx of IV drugs years ago. No tattoos. Has not drugs in over 20 yrs. Hx of blood transfusion as a child.  Appetite is good. No weight loss.  BMs are normal. Hx of seizures.  01/06/2018 ALP 98, AST 87, ALT 91, He Ab Igm negative, Hep B core abi gm negative. Hep C antibody positive.   disabled Review of Systems Past Medical History:  Diagnosis Date  . 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 Obstructive sleep apnea    transcribed from Dr. Freddie Apley note  . Pneumonia   . Seizure [facility])     Past Surgical History:  Procedure Laterality Date  . COLONOSCOPY  05/31/2012   Procedure: COLONOSCOPY;  Surgeon: Jamesetta So, MD;  [facility];  Service: Gastroenterology;  Laterality: N/A;  . TONSILLECTOMY      No Known Allergies  Current Outpatient Medications on File Prior to Visit  Medication Sig Dispense Refill  . calcium carbonate (OS-CAL) 600 MG TABS Take 600 mg by mouth daily. Calcium 600mg  with Vitamin D    . carbamazepine (TEGRETOL) 200 MG tablet Take 200 mg by mouth 3 (three) times daily. Takes 2 tabs in morning between 0700 and 0800 hrs, 1 tablet ine the afternoon about 1600 hrs and 2 tablets at bedtime about 2200 hrs    . multivitamin-iron-minerals-folic acid (THERAPEUTIC-M) TABS tablet Take 1 tablet by mouth daily.    Marland Kitchen PHENObarbital (LUMINAL) 100 MG tablet Take 100 mg by mouth at bedtime.    . tamsulosin (FLOMAX) 0.4 MG CAPS capsule Take 0.4 mg by mouth.     No current facility-administered medications on file prior to visit.         Objective:   Physical Exam Blood pressure (!) 160/84, pulse 98, temperature 97.9 F (36.6 C), height 5' 11.5" (1.816 m), weight 251 lb 8 oz (114.1 kg). Alert and oriented. Skin warm and dry. Oral mucosa is moist.   . Sclera anicteric, conjunctivae is pink.  Thyroid not enlarged. No cervical lymphadenopathy. Lungs clear. Heart regular rate and rhythm.  Abdomen is soft. Bowel sounds are positive. No hepatomegaly. No abdominal masses felt. No tenderness.  No edema to lower extremities.           Assessment & Plan:  Positive Hep C antibody. Will ger a Hep C quaint, Hep C genotype, HIV, US abdomen elast. Urine drug screen.

## 2018-01-27 NOTE — Telephone Encounter (Signed)
Error. He did not need me

## 2018-01-31 ENCOUNTER — Ambulatory Visit (HOSPITAL_COMMUNITY)
Admission: RE | Admit: 2018-01-31 | Discharge: 2018-01-31 | Disposition: A | Discharge status: Home / Self Care | Payer: Medicare PPO | Source: Ambulatory Visit | Attending: Internal Medicine | Admitting: Internal Medicine

## 2018-01-31 DIAGNOSIS — B182 Chronic viral hepatitis C: Secondary | ICD-10-CM | POA: Insufficient documentation

## 2018-01-31 DIAGNOSIS — B1920 Unspecified viral hepatitis C without hepatic coma: Secondary | ICD-10-CM | POA: Diagnosis not present

## 2018-02-02 ENCOUNTER — Telehealth (INDEPENDENT_AMBULATORY_CARE_PROVIDER_SITE_OTHER): Payer: Self-pay | Admitting: Internal Medicine

## 2018-02-02 DIAGNOSIS — B1710 Acute hepatitis C without hepatic coma: Secondary | ICD-10-CM

## 2018-02-02 LAB — DRUGS OF ABUSE SCREEN W/O ALC, ROUTINE URINE
AMPHETAMINES (1000 ng/mL SCRN): NEGATIVE
BARBITURATES: POSITIVE — AB
BENZODIAZEPINES: NEGATIVE
COCAINE METABOLITES: NEGATIVE
MARIJUANA MET (50 NG/ML SCRN): NEGATIVE
METHADONE: NEGATIVE
METHAQUALONE: NEGATIVE
OPIATES: NEGATIVE
PHENCYCLIDINE: NEGATIVE
PROPOXYPHENE: NEGATIVE

## 2018-02-02 LAB — CBC WITH DIFFERENTIAL/PLATELET
BASOS PCT: 0.7 %
Basophils Absolute: 52 cells/uL (ref 0–200)
Eosinophils Absolute: 370 cells/uL (ref 15–500)
Eosinophils Relative: 5 %
HEMATOCRIT: 40.3 % (ref 38.5–50.0)
Hemoglobin: 14.3 g/dL (ref 13.2–17.1)
Lymphs Abs: 4262 cells/uL — ABNORMAL HIGH (ref 850–3900)
MCH: 35 pg — ABNORMAL HIGH (ref 27.0–33.0)
MCHC: 35.5 g/dL (ref 32.0–36.0)
MCV: 98.5 fL (ref 80.0–100.0)
MPV: 11.9 fL (ref 7.5–12.5)
Monocytes Relative: 20.1 %
NEUTROS PCT: 16.6 %
Neutro Abs: 1228 cells/uL — ABNORMAL LOW (ref 1500–7800)
PLATELETS: 161 10*3/uL (ref 140–400)
RBC: 4.09 10*6/uL — AB (ref 4.20–5.80)
RDW: 14.4 % (ref 11.0–15.0)
TOTAL LYMPHOCYTE: 57.6 %
WBC: 7.4 10*3/uL (ref 3.8–10.8)
WBCMIX: 1487 {cells}/uL — AB (ref 200–950)

## 2018-02-02 LAB — HIV ANTIBODY (ROUTINE TESTING W REFLEX): HIV 1&2 Ab, 4th Generation: NONREACTIVE

## 2018-02-02 LAB — HEPATITIS C GENOTYPE

## 2018-02-02 LAB — HEPATITIS C RNA QUANTITATIVE
HCV Quantitative Log: 6.38 Log IU/mL — ABNORMAL HIGH
HCV RNA, PCR, QN: 2380000 IU/mL — ABNORMAL HIGH

## 2018-02-02 NOTE — Telephone Encounter (Signed)
David [name], Mavyret x 8 weeks.  

## 2018-02-02 NOTE — Telephone Encounter (Signed)
Cbc ordered

## 2018-02-03 NOTE — Telephone Encounter (Signed)
A PA has been started and once we know the outcome we will notify the patient.

## 2018-02-09 ENCOUNTER — Other Ambulatory Visit (INDEPENDENT_AMBULATORY_CARE_PROVIDER_SITE_OTHER): Payer: Self-pay | Admitting: Internal Medicine

## 2018-02-10 DIAGNOSIS — N400 Enlarged prostate without lower urinary tract symptoms: Secondary | ICD-10-CM | POA: Diagnosis not present

## 2018-02-10 DIAGNOSIS — G40909 Epilepsy, unspecified, not intractable, without status epilepticus: Secondary | ICD-10-CM | POA: Diagnosis not present

## 2018-02-10 DIAGNOSIS — F17200 Nicotine dependence, unspecified, uncomplicated: Secondary | ICD-10-CM | POA: Diagnosis not present

## 2018-02-10 DIAGNOSIS — Z6835 Body mass index (BMI) 35.0-35.9, adult: Secondary | ICD-10-CM | POA: Diagnosis not present

## 2018-02-10 DIAGNOSIS — B182 Chronic viral hepatitis C: Secondary | ICD-10-CM | POA: Diagnosis not present

## 2018-02-10 DIAGNOSIS — E669 Obesity, unspecified: Secondary | ICD-10-CM | POA: Diagnosis not present

## 2018-02-14 ENCOUNTER — Telehealth (INDEPENDENT_AMBULATORY_CARE_PROVIDER_SITE_OTHER): Payer: Self-pay | Admitting: Internal Medicine

## 2018-02-14 NOTE — Telephone Encounter (Signed)
Patient left message returning your call 

## 2018-02-14 NOTE — Telephone Encounter (Signed)
I have spoken with patient and explained they his Tegretol needs to be switched to another anti-seizure medications. He seen his neurologist on 03/03/2018.

## 2018-03-01 DIAGNOSIS — Z79899 Other long term (current) drug therapy: Secondary | ICD-10-CM | POA: Diagnosis not present

## 2018-03-01 DIAGNOSIS — G40309 Generalized idiopathic epilepsy and epileptic syndromes, not intractable, without status epilepticus: Secondary | ICD-10-CM | POA: Diagnosis not present

## 2018-03-01 DIAGNOSIS — G4710 Hypersomnia, unspecified: Secondary | ICD-10-CM | POA: Diagnosis not present

## 2018-03-01 DIAGNOSIS — T50905A Adverse effect of unspecified drugs, medicaments and biological substances, initial encounter: Secondary | ICD-10-CM | POA: Diagnosis not present

## 2018-03-09 ENCOUNTER — Telehealth (INDEPENDENT_AMBULATORY_CARE_PROVIDER_SITE_OTHER): Payer: Self-pay | Admitting: Internal Medicine

## 2018-03-09 NOTE — Telephone Encounter (Signed)
Please call patient regarding his hep C - ph# [phone]

## 2018-03-09 NOTE — Telephone Encounter (Signed)
I have spoken with patient. Dr. Merlene Laughter. He will wean patient off the Tegrotol. When he is off Tegrotol, we will start his Hep C treatment

## 2018-04-25 ENCOUNTER — Telehealth (INDEPENDENT_AMBULATORY_CARE_PROVIDER_SITE_OTHER): Payer: Self-pay | Admitting: Internal Medicine

## 2018-04-25 NOTE — Telephone Encounter (Signed)
noted 

## 2018-04-25 NOTE — Telephone Encounter (Signed)
Patient's original PA was done in May. It was approved through 04/01/2018. I have talked with Herschell Dimes with [employer] . Here is his comment.I will take care of it. Just will need to re-do the auth but shouldn't be a problem,. Will work to get sent out to patient asap. Nothing you should need to do. Will let you know if any issues.

## 2018-04-25 NOTE — Telephone Encounter (Signed)
Tammy, he will be off the Tegretol 05/06/2018. Can resubmit his Mavyret x 8 weeks.

## 2018-05-03 ENCOUNTER — Telehealth (INDEPENDENT_AMBULATORY_CARE_PROVIDER_SITE_OTHER): Payer: Self-pay | Admitting: Internal Medicine

## 2018-05-03 NOTE — Telephone Encounter (Signed)
I left [employer] a message that the Phenobarbital will be continued. The tegretol has been stopped.

## 2018-05-05 ENCOUNTER — Telehealth (INDEPENDENT_AMBULATORY_CARE_PROVIDER_SITE_OTHER): Payer: Self-pay | Admitting: *Deleted

## 2018-05-05 NOTE — Telephone Encounter (Signed)
[facility] called and states that the patient's Hep C medication will be delivered to the office on 05/10/2018.  Patient will be called and made aware as soon as we get the medication.

## 2018-05-10 ENCOUNTER — Telehealth (INDEPENDENT_AMBULATORY_CARE_PROVIDER_SITE_OTHER): Payer: Self-pay | Admitting: Internal Medicine

## 2018-05-10 NOTE — Telephone Encounter (Signed)
CBC, Hepatic and Hep C quaint in 8 weeks

## 2018-05-11 ENCOUNTER — Other Ambulatory Visit (INDEPENDENT_AMBULATORY_CARE_PROVIDER_SITE_OTHER): Payer: Self-pay | Admitting: *Deleted

## 2018-05-11 DIAGNOSIS — B1921 Unspecified viral hepatitis C with hepatic coma: Secondary | ICD-10-CM

## 2018-05-11 NOTE — Telephone Encounter (Signed)
Labs have been noted for October and the patient will be sent a letter as a reminder.

## 2018-05-23 ENCOUNTER — Telehealth (INDEPENDENT_AMBULATORY_CARE_PROVIDER_SITE_OTHER): Payer: Self-pay | Admitting: Internal Medicine

## 2018-05-23 NOTE — Telephone Encounter (Signed)
Addressed. He will call pharmacy when he he needs a refill

## 2018-05-23 NOTE — Telephone Encounter (Signed)
Please call patient regarding a prescription ph# [phone]

## 2018-06-02 ENCOUNTER — Telehealth (INDEPENDENT_AMBULATORY_CARE_PROVIDER_SITE_OTHER): Payer: Self-pay | Admitting: Internal Medicine

## 2018-06-02 DIAGNOSIS — B1920 Unspecified viral hepatitis C without hepatic coma: Secondary | ICD-10-CM | POA: Diagnosis not present

## 2018-06-02 NOTE — Telephone Encounter (Signed)
err

## 2018-06-06 LAB — CBC WITH DIFFERENTIAL/PLATELET
Basophils Absolute: 43 cells/uL (ref 0–200)
Basophils Relative: 0.7 %
EOS PCT: 6 %
Eosinophils Absolute: 372 cells/uL (ref 15–500)
HEMATOCRIT: 40.8 % (ref 38.5–50.0)
Hemoglobin: 14.1 g/dL (ref 13.2–17.1)
LYMPHS ABS: 3236 {cells}/uL (ref 850–3900)
MCH: 35.4 pg — ABNORMAL HIGH (ref 27.0–33.0)
MCHC: 34.6 g/dL (ref 32.0–36.0)
MCV: 102.5 fL — ABNORMAL HIGH (ref 80.0–100.0)
MONOS PCT: 19.5 %
MPV: 11.8 fL (ref 7.5–12.5)
NEUTROS ABS: 1339 {cells}/uL — AB (ref 1500–7800)
Neutrophils Relative %: 21.6 %
Platelets: 172 10*3/uL (ref 140–400)
RBC: 3.98 10*6/uL — AB (ref 4.20–5.80)
RDW: 13.9 % (ref 11.0–15.0)
Total Lymphocyte: 52.2 %
WBC mixed population: 1209 cells/uL — ABNORMAL HIGH (ref 200–950)
WBC: 6.2 10*3/uL (ref 3.8–10.8)

## 2018-06-06 LAB — HEPATIC FUNCTION PANEL
AG Ratio: 1.5 (calc) (ref 1.0–2.5)
ALKALINE PHOSPHATASE (APISO): 73 U/L (ref 40–115)
ALT: 67 U/L — ABNORMAL HIGH (ref 9–46)
AST: 58 U/L — AB (ref 10–35)
Albumin: 4 g/dL (ref 3.6–5.1)
BILIRUBIN DIRECT: 0.3 mg/dL — AB (ref 0.0–0.2)
BILIRUBIN INDIRECT: 0.7 mg/dL (ref 0.2–1.2)
Globulin: 2.6 g/dL (calc) (ref 1.9–3.7)
TOTAL PROTEIN: 6.6 g/dL (ref 6.1–8.1)
Total Bilirubin: 1 mg/dL (ref 0.2–1.2)

## 2018-06-06 LAB — HEPATITIS C RNA QUANTITATIVE
HCV Quantitative Log: <1.18 Log IU/mL — AB
HCV RNA, PCR, QN: <15 IU/mL — AB

## 2018-06-07 ENCOUNTER — Telehealth (INDEPENDENT_AMBULATORY_CARE_PROVIDER_SITE_OTHER): Payer: Self-pay | Admitting: Internal Medicine

## 2018-06-07 NOTE — Telephone Encounter (Signed)
Tammy, Hep C quaint in 4 weeks.

## 2018-06-08 ENCOUNTER — Other Ambulatory Visit (INDEPENDENT_AMBULATORY_CARE_PROVIDER_SITE_OTHER): Payer: Self-pay | Admitting: *Deleted

## 2018-06-08 ENCOUNTER — Encounter (INDEPENDENT_AMBULATORY_CARE_PROVIDER_SITE_OTHER): Payer: Self-pay | Admitting: *Deleted

## 2018-06-08 DIAGNOSIS — B182 Chronic viral hepatitis C: Secondary | ICD-10-CM

## 2018-06-08 DIAGNOSIS — B1921 Unspecified viral hepatitis C with hepatic coma: Secondary | ICD-10-CM

## 2018-06-08 NOTE — Telephone Encounter (Signed)
This lab has already been ordered. The patient will be sent a letter as a reminder.

## 2018-07-05 ENCOUNTER — Encounter (INDEPENDENT_AMBULATORY_CARE_PROVIDER_SITE_OTHER): Payer: Self-pay | Admitting: Internal Medicine

## 2018-07-05 ENCOUNTER — Ambulatory Visit (INDEPENDENT_AMBULATORY_CARE_PROVIDER_SITE_OTHER): Payer: Medicare PPO | Admitting: Internal Medicine

## 2018-07-05 VITALS — BP 190/90 | HR 76 | Temp 97.6°F | Ht 71.5 in | Wt 244.6 lb

## 2018-07-05 DIAGNOSIS — B182 Chronic viral hepatitis C: Secondary | ICD-10-CM

## 2018-07-05 DIAGNOSIS — B1921 Unspecified viral hepatitis C with hepatic coma: Secondary | ICD-10-CM | POA: Diagnosis not present

## 2018-07-05 NOTE — Patient Instructions (Signed)
OV in 6 months. 

## 2018-07-05 NOTE — Progress Notes (Signed)
   Subjective:    Patient ID: David [name], male    DOB: 11/09/1950, 67 y.o.   MRN: [id number]  HPI  Here today for f/u. Hx of Hepatitis C.  Genotype 1A.  Treated with Mayvret x 8 weeks.  [place] Elast F3 and F4.  He finished the [place] yesterday. He is doing good. No weight loss. He did not have any side effects from the medication.  Appetite is good.  BMs are normal. No rashes or joint pain.   06/02/2018 Hep C quaint less than 15.   Hepatic Function Latest Ref Rng & Units 06/02/2018  Total Protein 6.1 - 8.1 g/dL 6.6  AST 10 - 35 U/L 58(H)  ALT 9 - 46 U/L 67(H)  Total Bilirubin 0.2 - 1.2 mg/dL 1.0  Bilirubin, Direct 0.0 - 0.2 mg/dL 0.3(H)   CBC    Component Value Date/Time   WBC 6.2 06/02/2018 0810   RBC 3.98 (L) 06/02/2018 0810   HGB 14.1 06/02/2018 0810   HCT 40.8 06/02/2018 0810   PLT 172 06/02/2018 0810   MCV 102.5 (H) 06/02/2018 0810   MCH 35.4 (H) 06/02/2018 0810   MCHC 34.6 06/02/2018 0810   RDW 13.9 06/02/2018 0810   LYMPHSABS 3,236 06/02/2018 0810   MONOABS 0.8 01/11/2013 0829   EOSABS 372 06/02/2018 0810   BASOSABS 43 06/02/2018 0810       Review of Systems Past Medical History:  Diagnosis Date  . 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 Obstructive sleep apnea    transcribed from Dr. Freddie Apley note  . Pneumonia   . Seizure [facility])     Past Surgical History:  Procedure Laterality Date  . COLONOSCOPY  05/31/2012   Procedure: COLONOSCOPY;  Surgeon: Jamesetta So, MD;  Location[facility];  Service: Gastroenterology;  Laterality: N/A;  . TONSILLECTOMY      No Known Allergies  Current Outpatient Medications on File Prior to Visit  Medication Sig Dispense Refill  . calcium carbonate (OS-CAL) 600 MG TABS Take 600 mg by mouth daily. Calcium 600mg  with Vitamin D    . tamsulosin (FLOMAX) 0.4 MG CAPS capsule Take 0.4 mg by mouth.    . carbamazepine (TEGRETOL) 200 MG tablet Take 200 mg by mouth 3 (three) times daily. Takes 2 tabs  in morning between 0700 and 0800 hrs, 1 tablet ine the afternoon about 1600 hrs and 2 tablets at bedtime about 2200 hrs    . multivitamin-iron-minerals-folic acid (THERAPEUTIC-M) TABS tablet Take 1 tablet by mouth daily.    Marland Kitchen PHENObarbital (LUMINAL) 100 MG tablet Take 100 mg by mouth at bedtime.     No current facility-administered medications on file prior to visit.         Objective:   Physical Exam Blood pressure (!) 190/90, pulse 76, temperature 97.6 F (36.4 C), height 5' 11.5" (1.816 m), weight 244 lb 9.6 oz (110.9 kg). Alert and oriented. Skin warm and dry. Oral mucosa is moist.   . Sclera anicteric, conjunctivae is pink. Thyroid not enlarged. No cervical lymphadenopathy. Lungs clear. Heart regular rate and rhythm.  Abdomen is soft. Bowel sounds are positive. No hepatomegaly. No abdominal masses felt. No tenderness.  No edema to lower extremities.           Assessment & Plan:  Hepatitis C. Will get a Hep C quaint, hepatic, and CBC OV in 6 months

## 2018-07-08 LAB — CBC
HCT: 42 % (ref 38.5–50.0)
Hemoglobin: 14.7 g/dL (ref 13.2–17.1)
MCH: 35 pg — ABNORMAL HIGH (ref 27.0–33.0)
MCHC: 35 g/dL (ref 32.0–36.0)
MCV: 100 fL (ref 80.0–100.0)
MPV: 12.2 fL (ref 7.5–12.5)
Platelets: 165 10*3/uL (ref 140–400)
RBC: 4.2 10*6/uL (ref 4.20–5.80)
RDW: 14 % (ref 11.0–15.0)
WBC: 7.2 10*3/uL (ref 3.8–10.8)

## 2018-07-08 LAB — HEPATIC FUNCTION PANEL
AG Ratio: 1.5 (calc) (ref 1.0–2.5)
ALKALINE PHOSPHATASE (APISO): 80 U/L (ref 40–115)
ALT: 70 U/L — ABNORMAL HIGH (ref 9–46)
AST: 64 U/L — AB (ref 10–35)
Albumin: 4 g/dL (ref 3.6–5.1)
BILIRUBIN INDIRECT: 0.5 mg/dL (ref 0.2–1.2)
Bilirubin, Direct: 0.1 mg/dL (ref 0.0–0.2)
Globulin: 2.6 g/dL (calc) (ref 1.9–3.7)
TOTAL PROTEIN: 6.6 g/dL (ref 6.1–8.1)
Total Bilirubin: 0.6 mg/dL (ref 0.2–1.2)

## 2018-07-08 LAB — HEPATITIS C RNA QUANTITATIVE
HCV Quantitative Log: <1.18 Log IU/mL
HCV RNA, PCR, QN: <15 IU/mL

## 2018-07-11 ENCOUNTER — Other Ambulatory Visit (INDEPENDENT_AMBULATORY_CARE_PROVIDER_SITE_OTHER): Payer: Self-pay | Admitting: *Deleted

## 2018-07-11 DIAGNOSIS — B182 Chronic viral hepatitis C: Secondary | ICD-10-CM

## 2018-12-05 DIAGNOSIS — Z6836 Body mass index (BMI) 36.0-36.9, adult: Secondary | ICD-10-CM | POA: Diagnosis not present

## 2018-12-05 DIAGNOSIS — R7309 Other abnormal glucose: Secondary | ICD-10-CM | POA: Diagnosis not present

## 2018-12-05 DIAGNOSIS — N400 Enlarged prostate without lower urinary tract symptoms: Secondary | ICD-10-CM | POA: Diagnosis not present

## 2018-12-05 DIAGNOSIS — Z0001 Encounter for general adult medical examination with abnormal findings: Secondary | ICD-10-CM | POA: Diagnosis not present

## 2018-12-05 DIAGNOSIS — R945 Abnormal results of liver function studies: Secondary | ICD-10-CM | POA: Diagnosis not present

## 2018-12-05 DIAGNOSIS — Z1389 Encounter for screening for other disorder: Secondary | ICD-10-CM | POA: Diagnosis not present

## 2019-01-04 ENCOUNTER — Other Ambulatory Visit: Payer: Self-pay

## 2019-01-04 ENCOUNTER — Ambulatory Visit (INDEPENDENT_AMBULATORY_CARE_PROVIDER_SITE_OTHER): Payer: Medicare PPO | Admitting: Internal Medicine

## 2019-01-04 ENCOUNTER — Encounter (INDEPENDENT_AMBULATORY_CARE_PROVIDER_SITE_OTHER): Payer: Self-pay | Admitting: Internal Medicine

## 2019-01-04 DIAGNOSIS — B182 Chronic viral hepatitis C: Secondary | ICD-10-CM

## 2019-01-04 NOTE — Progress Notes (Signed)
Subjective:    Patient ID: David [name], male    DOB: February 26, 1951, 68 y.o.   MRN: [id number] 2:55-305pm. Time 10 mintues.  HPI Follow up for Hepatitis C. Patient consents to this telephone OV. He is at home. I am in the office. Telephone OV due to risk of COVID-019.    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     Appetite is good. No weight loss. BMs are moving okay. He says he had a physical at Dr. Hilma Favors and his liver numbers were elevated. At [place] with me, they were slightly elevated. He did not have them repeated.                                                                                                                                                                                                                                              Review of Systems Past Medical History:  Diagnosis Date  . 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 Obstructive sleep apnea    transcribed from Dr. Freddie Apley note  . Pneumonia   . Seizure [facility])     Past Surgical History:  Procedure Laterality Date  . COLONOSCOPY  05/31/2012   Procedure: COLONOSCOPY;  Surgeon: Jamesetta So, MD;  Location[facility];  Service: Gastroenterology;  Laterality: N/A;  . TONSILLECTOMY      No Known Allergies  Current Outpatient Medications on File Prior to Visit  Medication Sig Dispense Refill  . calcium carbonate (OS-CAL) 600 MG TABS Take 600 mg by mouth daily. Calcium 600mg  with Vitamin D    . carbamazepine (TEGRETOL) 200 MG tablet Take 200 mg by mouth 3 (three) times daily. Takes 2 tabs in morning between 0700 and 0800 hrs, 1 tablet ine the afternoon about 1600 hrs and 2 tablets at bedtime about 2200 hrs    . multivitamin-iron-minerals-folic acid (THERAPEUTIC-M) TABS tablet Take 1 tablet by mouth daily.    Marland Kitchen PHENObarbital (LUMINAL) 100 MG tablet Take 100 mg by mouth at bedtime.    . tamsulosin (FLOMAX) 0.4 MG CAPS capsule Take 0.4 mg by  mouth.     No current facility-administered medications on file prior to visit.         Objective:   Physical Exam  deferred      Assessment &  Plan:  Hepatitis C. He is doing well. Will repeat Hepatic, CBC and Hep C quaint. OV in one year.

## 2019-01-05 DIAGNOSIS — N400 Enlarged prostate without lower urinary tract symptoms: Secondary | ICD-10-CM | POA: Diagnosis not present

## 2019-01-05 DIAGNOSIS — F17210 Nicotine dependence, cigarettes, uncomplicated: Secondary | ICD-10-CM | POA: Diagnosis not present

## 2019-01-05 DIAGNOSIS — E669 Obesity, unspecified: Secondary | ICD-10-CM | POA: Diagnosis not present

## 2019-01-05 DIAGNOSIS — G40909 Epilepsy, unspecified, not intractable, without status epilepticus: Secondary | ICD-10-CM | POA: Diagnosis not present

## 2019-01-05 DIAGNOSIS — B182 Chronic viral hepatitis C: Secondary | ICD-10-CM | POA: Diagnosis not present

## 2019-01-05 DIAGNOSIS — Z6833 Body mass index (BMI) 33.0-33.9, adult: Secondary | ICD-10-CM | POA: Diagnosis not present

## 2019-01-07 LAB — CBC
HCT: 42.7 % (ref 38.5–50.0)
Hemoglobin: 14.9 g/dL (ref 13.2–17.1)
MCH: 34.3 pg — ABNORMAL HIGH (ref 27.0–33.0)
MCHC: 34.9 g/dL (ref 32.0–36.0)
MCV: 98.2 fL (ref 80.0–100.0)
MPV: 11.8 fL (ref 7.5–12.5)
Platelets: 165 10*3/uL (ref 140–400)
RBC: 4.35 10*6/uL (ref 4.20–5.80)
RDW: 14.9 % (ref 11.0–15.0)
WBC: 10.5 10*3/uL (ref 3.8–10.8)

## 2019-01-07 LAB — HEPATIC FUNCTION PANEL
AG Ratio: 1.6 (calc) (ref 1.0–2.5)
ALT: 114 U/L — ABNORMAL HIGH (ref 9–46)
AST: 90 U/L — ABNORMAL HIGH (ref 10–35)
Albumin: 4 g/dL (ref 3.6–5.1)
Alkaline phosphatase (APISO): 84 U/L (ref 35–144)
Bilirubin, Direct: 0.1 mg/dL (ref 0.0–0.2)
Globulin: 2.5 g/dL (calc) (ref 1.9–3.7)
Indirect Bilirubin: 0.5 mg/dL (calc) (ref 0.2–1.2)
Total Bilirubin: 0.6 mg/dL (ref 0.2–1.2)
Total Protein: 6.5 g/dL (ref 6.1–8.1)

## 2019-01-07 LAB — HEPATITIS C RNA QUANTITATIVE
HCV Quantitative Log: <1.18 Log IU/mL
HCV RNA, PCR, QN: <15 IU/mL

## 2019-01-09 ENCOUNTER — Telehealth (INDEPENDENT_AMBULATORY_CARE_PROVIDER_SITE_OTHER): Payer: Self-pay | Admitting: Internal Medicine

## 2019-01-09 ENCOUNTER — Other Ambulatory Visit (INDEPENDENT_AMBULATORY_CARE_PROVIDER_SITE_OTHER): Payer: Self-pay | Admitting: *Deleted

## 2019-01-09 DIAGNOSIS — R748 Abnormal levels of other serum enzymes: Secondary | ICD-10-CM

## 2019-01-09 DIAGNOSIS — B182 Chronic viral hepatitis C: Secondary | ICD-10-CM

## 2019-01-09 NOTE — Telephone Encounter (Signed)
XRAY RUQ ordered

## 2019-01-10 ENCOUNTER — Encounter (INDEPENDENT_AMBULATORY_CARE_PROVIDER_SITE_OTHER): Payer: Self-pay | Admitting: *Deleted

## 2019-02-14 DIAGNOSIS — Z79899 Other long term (current) drug therapy: Secondary | ICD-10-CM | POA: Diagnosis not present

## 2019-02-14 DIAGNOSIS — G40309 Generalized idiopathic epilepsy and epileptic syndromes, not intractable, without status epilepticus: Secondary | ICD-10-CM | POA: Diagnosis not present

## 2019-02-14 DIAGNOSIS — T50905A Adverse effect of unspecified drugs, medicaments and biological substances, initial encounter: Secondary | ICD-10-CM | POA: Diagnosis not present

## 2019-02-14 DIAGNOSIS — G4710 Hypersomnia, unspecified: Secondary | ICD-10-CM | POA: Diagnosis not present

## 2019-02-20 ENCOUNTER — Other Ambulatory Visit: Payer: Self-pay

## 2019-02-20 ENCOUNTER — Ambulatory Visit (HOSPITAL_COMMUNITY)
Admission: RE | Admit: 2019-02-20 | Discharge: 2019-02-20 | Disposition: A | Discharge status: Home / Self Care | Payer: Medicare PPO | Source: Ambulatory Visit | Attending: Internal Medicine | Admitting: Internal Medicine

## 2019-02-20 DIAGNOSIS — B182 Chronic viral hepatitis C: Secondary | ICD-10-CM | POA: Diagnosis not present

## 2019-02-20 DIAGNOSIS — B1920 Unspecified viral hepatitis C without hepatic coma: Secondary | ICD-10-CM | POA: Diagnosis not present

## 2019-02-20 DIAGNOSIS — R945 Abnormal results of liver function studies: Secondary | ICD-10-CM | POA: Diagnosis not present

## 2019-02-20 DIAGNOSIS — R748 Abnormal levels of other serum enzymes: Secondary | ICD-10-CM

## 2019-03-06 ENCOUNTER — Other Ambulatory Visit (INDEPENDENT_AMBULATORY_CARE_PROVIDER_SITE_OTHER): Payer: Self-pay | Admitting: *Deleted

## 2019-03-06 DIAGNOSIS — B182 Chronic viral hepatitis C: Secondary | ICD-10-CM

## 2019-04-10 DIAGNOSIS — B182 Chronic viral hepatitis C: Secondary | ICD-10-CM | POA: Diagnosis not present

## 2019-04-11 LAB — HEPATIC FUNCTION PANEL
AG Ratio: 1.5 (calc) (ref 1.0–2.5)
ALT: 98 U/L — ABNORMAL HIGH (ref 9–46)
AST: 78 U/L — ABNORMAL HIGH (ref 10–35)
Albumin: 4.1 g/dL (ref 3.6–5.1)
Alkaline phosphatase (APISO): 69 U/L (ref 35–144)
Bilirubin, Direct: 0.2 mg/dL (ref 0.0–0.2)
Globulin: 2.8 g/dL (calc) (ref 1.9–3.7)
Indirect Bilirubin: 0.8 mg/dL (calc) (ref 0.2–1.2)
Total Bilirubin: 1 mg/dL (ref 0.2–1.2)
Total Protein: 6.9 g/dL (ref 6.1–8.1)

## 2019-04-12 ENCOUNTER — Telehealth (INDEPENDENT_AMBULATORY_CARE_PROVIDER_SITE_OTHER): Payer: Self-pay | Admitting: Internal Medicine

## 2019-04-12 ENCOUNTER — Other Ambulatory Visit (INDEPENDENT_AMBULATORY_CARE_PROVIDER_SITE_OTHER): Payer: Self-pay | Admitting: *Deleted

## 2019-04-12 DIAGNOSIS — R7989 Other specified abnormal findings of blood chemistry: Secondary | ICD-10-CM

## 2019-04-12 NOTE — Telephone Encounter (Signed)
Lab noted for 8 weeks ,a letter will be sent as a reminder to the patient.

## 2019-04-12 NOTE — Telephone Encounter (Signed)
Hepatic in 8 weeks.

## 2019-05-08 ENCOUNTER — Other Ambulatory Visit (INDEPENDENT_AMBULATORY_CARE_PROVIDER_SITE_OTHER): Payer: Self-pay | Admitting: *Deleted

## 2019-05-08 DIAGNOSIS — R7989 Other specified abnormal findings of blood chemistry: Secondary | ICD-10-CM

## 2019-05-08 DIAGNOSIS — B182 Chronic viral hepatitis C: Secondary | ICD-10-CM

## 2019-05-24 DIAGNOSIS — B182 Chronic viral hepatitis C: Secondary | ICD-10-CM | POA: Diagnosis not present

## 2019-05-25 LAB — HEPATIC FUNCTION PANEL
AG Ratio: 1.6 (calc) (ref 1.0–2.5)
ALT: 83 U/L — ABNORMAL HIGH (ref 9–46)
AST: 62 U/L — ABNORMAL HIGH (ref 10–35)
Albumin: 4.1 g/dL (ref 3.6–5.1)
Alkaline phosphatase (APISO): 66 U/L (ref 35–144)
Bilirubin, Direct: 0.2 mg/dL (ref 0.0–0.2)
Globulin: 2.5 g/dL (calc) (ref 1.9–3.7)
Indirect Bilirubin: 0.6 mg/dL (calc) (ref 0.2–1.2)
Total Bilirubin: 0.8 mg/dL (ref 0.2–1.2)
Total Protein: 6.6 g/dL (ref 6.1–8.1)

## 2019-06-15 ENCOUNTER — Encounter (INDEPENDENT_AMBULATORY_CARE_PROVIDER_SITE_OTHER): Payer: Self-pay | Admitting: *Deleted

## 2019-06-21 ENCOUNTER — Other Ambulatory Visit (INDEPENDENT_AMBULATORY_CARE_PROVIDER_SITE_OTHER): Payer: Self-pay | Admitting: Internal Medicine

## 2019-06-21 DIAGNOSIS — B1921 Unspecified viral hepatitis C with hepatic coma: Secondary | ICD-10-CM

## 2019-06-28 ENCOUNTER — Other Ambulatory Visit: Payer: Self-pay

## 2019-06-28 ENCOUNTER — Ambulatory Visit (HOSPITAL_COMMUNITY)
Admission: RE | Admit: 2019-06-28 | Discharge: 2019-06-28 | Disposition: A | Discharge status: Home / Self Care | Payer: Medicare PPO | Source: Ambulatory Visit | Attending: Internal Medicine | Admitting: Internal Medicine

## 2019-06-28 DIAGNOSIS — B182 Chronic viral hepatitis C: Secondary | ICD-10-CM | POA: Diagnosis not present

## 2019-06-28 DIAGNOSIS — B1921 Unspecified viral hepatitis C with hepatic coma: Secondary | ICD-10-CM | POA: Diagnosis not present

## 2019-07-13 ENCOUNTER — Ambulatory Visit (INDEPENDENT_AMBULATORY_CARE_PROVIDER_SITE_OTHER): Payer: Medicare PPO | Admitting: Nurse Practitioner

## 2019-07-13 ENCOUNTER — Encounter (INDEPENDENT_AMBULATORY_CARE_PROVIDER_SITE_OTHER): Payer: Self-pay | Admitting: Nurse Practitioner

## 2019-07-13 ENCOUNTER — Other Ambulatory Visit: Payer: Self-pay

## 2019-07-13 DIAGNOSIS — Z8619 Personal history of other infectious and parasitic diseases: Secondary | ICD-10-CM

## 2019-07-13 DIAGNOSIS — B182 Chronic viral hepatitis C: Secondary | ICD-10-CM | POA: Diagnosis not present

## 2019-07-13 DIAGNOSIS — R7989 Other specified abnormal findings of blood chemistry: Secondary | ICD-10-CM | POA: Diagnosis not present

## 2019-07-13 NOTE — Progress Notes (Signed)
Subjective:    Patient ID: David [name], male    DOB: [birth year], 68 y.o.   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 He was treated with Mavyret end of August 2019 with SVR. HIV negative 01/27/2018. Hep B surface antigen negative 01/06/2018.  He presents today for his annual follow-up.  Labs 05/24/2019 showed AST 62.  ALT 83.  His LFTs have not normalized since his treatment with Mavyret.  He underwent an abdominal sonogram 06/28/2019 which showed hepatic steatosis.  He has gained 20 pounds over the past year. He was previously going to the gym to exercise but stopped going due to the Covid pandemic.  He denies having any reflux symptoms.  No upper or lower abdominal pain.  He is passing a normal formed bowel movement daily.  Active bleeding or melena.  He underwent a colonoscopy in 2013 by Dr. Aviva Signs which was normal.  He was advised to repeat a colonoscopy in 10 years.  No family history of colorectal cancer.  He is no longer taking Tegretol or phenobarbital as this was discontinued by his neurologist.  He denies having any recent seizure activity.  Alcohol use.  He continues to smoke 1/2 pack cigarettes daily.  He has a smoker's cough.  Abdominal sonogram 06/28/2019:  Increased echogenicity of hepatic parenchyma is noted consistent with hepatic steatosis or diffuse hepatocellular disease. No focal sonographic hepatic abnormality is noted.  Past Medical History:  Diagnosis Date  . 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 Obstructive sleep apnea    transcribed from Dr. Freddie Apley note  . Pneumonia   . Seizure [facility])    Past Surgical History:  Procedure Laterality Date  . COLONOSCOPY  05/31/2012   Procedure:  COLONOSCOPY;  Surgeon: Jamesetta So, MD;  [facility];  Service: Gastroenterology;  Laterality: N/A;  . TONSILLECTOMY     Review of Systems HPI, all other systems reviewed and are negative    Objective:   Physical Exam  BP (!) 154/83   Pulse 91   Temp 98.1 F (36.7 C) (Oral)   Ht 5' 11.5" (1.816 m)   Wt 270 lb 12.8 oz (122.8 kg)   BMI 37.24 kg/m  General: 68 year old obese male in no acute distress Eyes: Sclera nonicteric, conjunctiva pink Mouth: Upper and lower dentures Neck: Supple, no lymphadenopathy Heart: Regular rate and rhythm, no murmurs Lungs: Breath sounds with coarse expiratory wheezes throughout Abdomen: Soft, nontender, no masses or organomegaly, positive bowel sounds to all 4 quadrants Extremities: No edema Neuro: Alert and oriented x4, no focal deficits    Assessment & Plan:   52.  68 year old male with a history of chronic hepatitis C genotype 1b treated with Mavyret August 2019 with SVR and hepatic steatosis with elevated LFTs -I discussed the patient's risk of nonalcoholic fatty liver disease at risk for cirrhosis.  I discussed the necessity of exercise as tolerated, reduce sugars and other carbohydrate intake -Labs to include CBC, CMP, GGT, hepatitis C RNA quant, hepatitis B surface antibody, hepatitis a total antibody, iron, ceruloplasmin, alpha-1 antitrypsin, ANA, SMA and AMA -Abdominal ultrasound with elastography in 1 year -Follow-up in the office in 6  months  2.  Colon cancer screening -Next colonoscopy due September 2023

## 2019-07-13 NOTE — Patient Instructions (Signed)
1.  Complete the provided lab order   2.  No sodas.  No white bread or white rice.  3.  Exercise as tolerated  4.  Follow-up with your primary care physician to discuss smoking cessation  5.  Follow-up in our office in 6 months  6.  Further recommendations to be determined after your laboratory results received  7.  You are due for a colonoscopy September 2023

## 2019-07-18 LAB — GAMMA GT: GGT: 34 U/L (ref 3–70)

## 2019-07-18 LAB — HEPATITIS C RNA QUANTITATIVE
HCV Quantitative Log: <1.18 Log IU/mL
HCV RNA, PCR, QN: <15 IU/mL

## 2019-07-18 LAB — CBC WITH DIFFERENTIAL/PLATELET
Absolute Monocytes: 1178 cells/uL — ABNORMAL HIGH (ref 200–950)
Basophils Absolute: 53 cells/uL (ref 0–200)
Basophils Relative: 0.7 %
Eosinophils Absolute: 525 cells/uL — ABNORMAL HIGH (ref 15–500)
Eosinophils Relative: 7 %
HCT: 43.5 % (ref 38.5–50.0)
Hemoglobin: 14.9 g/dL (ref 13.2–17.1)
Lymphs Abs: 4958 cells/uL — ABNORMAL HIGH (ref 850–3900)
MCH: 33.9 pg — ABNORMAL HIGH (ref 27.0–33.0)
MCHC: 34.3 g/dL (ref 32.0–36.0)
MCV: 99.1 fL (ref 80.0–100.0)
MPV: 12.5 fL (ref 7.5–12.5)
Monocytes Relative: 15.7 %
Neutro Abs: 788 cells/uL — ABNORMAL LOW (ref 1500–7800)
Neutrophils Relative %: 10.5 %
Platelets: 165 10*3/uL (ref 140–400)
RBC: 4.39 10*6/uL (ref 4.20–5.80)
RDW: 15.5 % — ABNORMAL HIGH (ref 11.0–15.0)
Total Lymphocyte: 66.1 %
WBC: 7.5 10*3/uL (ref 3.8–10.8)

## 2019-07-18 LAB — HEPATITIS B SURFACE ANTIBODY,QUALITATIVE: Hep B S Ab: NONREACTIVE

## 2019-07-18 LAB — MITOCHONDRIAL ANTIBODIES: Mitochondrial M2 Ab, IgG: <=20 U

## 2019-07-18 LAB — ALPHA-1-ANTITRYPSIN: A-1 Antitrypsin, Ser: 133 mg/dL (ref 83–199)

## 2019-07-18 LAB — HEPATITIS A ANTIBODY, TOTAL: Hepatitis A AB,Total: NONREACTIVE

## 2019-07-18 LAB — HEPATITIS B CORE ANTIBODY, TOTAL: Hep B Core Total Ab: NONREACTIVE

## 2019-07-18 LAB — ANTI-SMOOTH MUSCLE ANTIBODY, IGG: Actin (Smooth Muscle) Antibody (IGG): <20 U (ref <20)

## 2019-07-18 LAB — CERULOPLASMIN: Ceruloplasmin: 29 mg/dL (ref 18–36)

## 2019-07-18 LAB — IRON: Iron: 82 ug/dL (ref 50–180)

## 2019-07-18 LAB — IGG: IgG (Immunoglobin G), Serum: 1124 mg/dL (ref 600–1540)

## 2019-09-18 IMAGING — US ULTRASOUND ABDOMEN LIMITED
1 series · 14 of 25 positions shown · non-contrast
Comparison: 01/31/2018

CLINICAL DATA: Hepatitis C and elevated LFTs

EXAM:
ULTRASOUND ABDOMEN LIMITED RIGHT UPPER QUADRANT

[Series 1: ultrasound abdomen limited · 0.18mm/px · 14 of 46 slices shown]
[im 1/46]
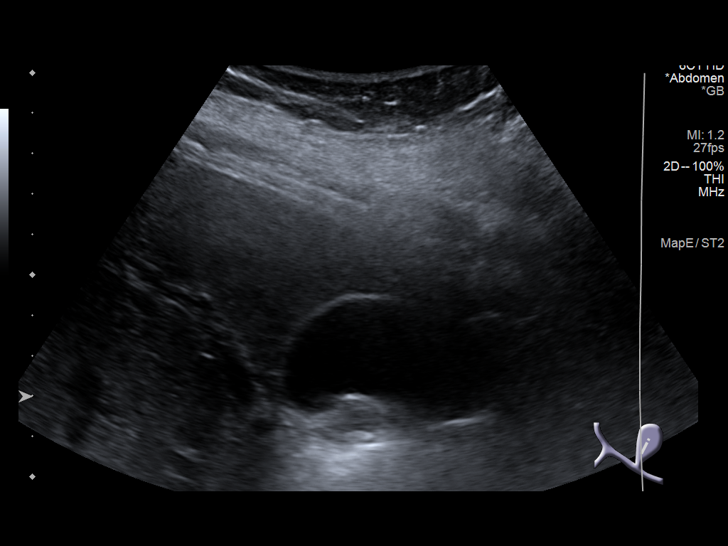
[im 4/46]
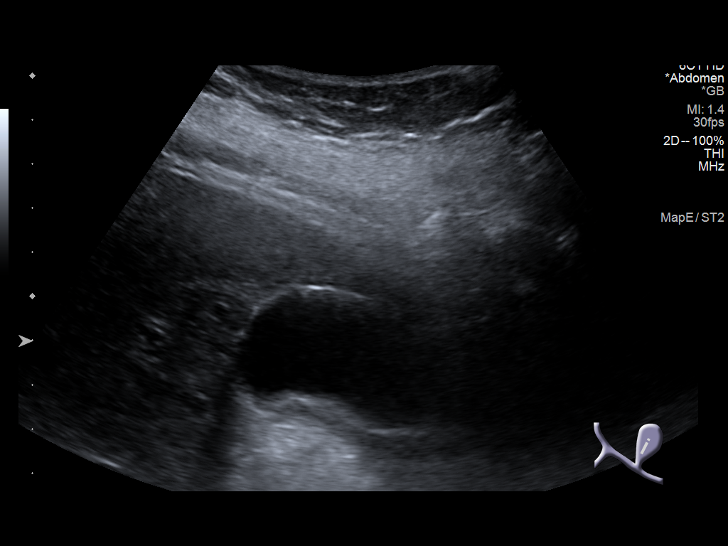
[im 8/46]
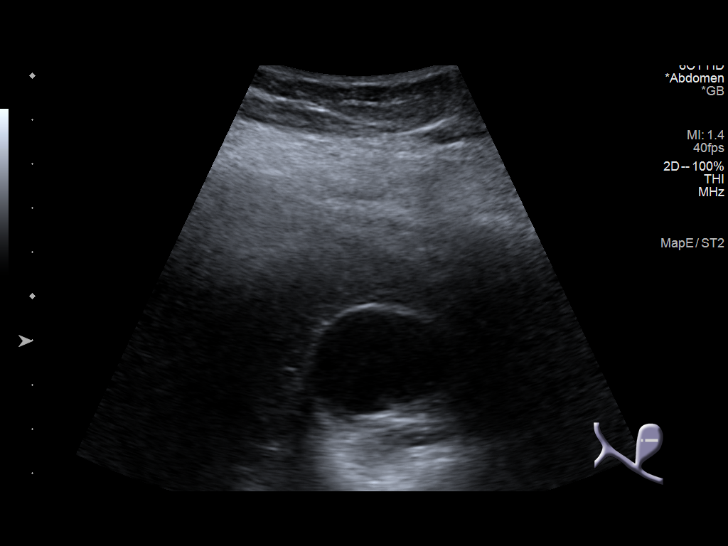
[im 12/46]
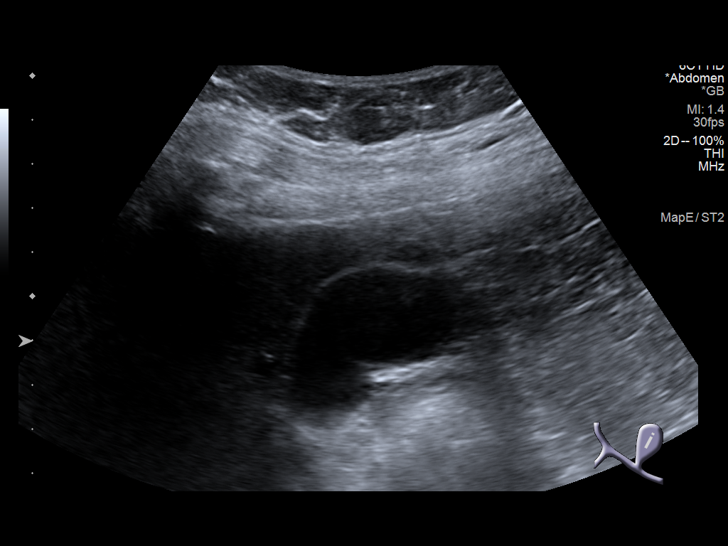
[im 16/46]
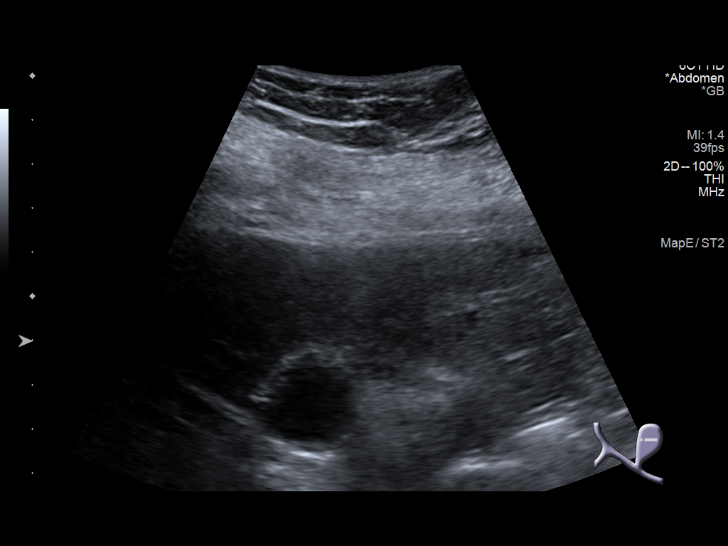
[im 17/46]
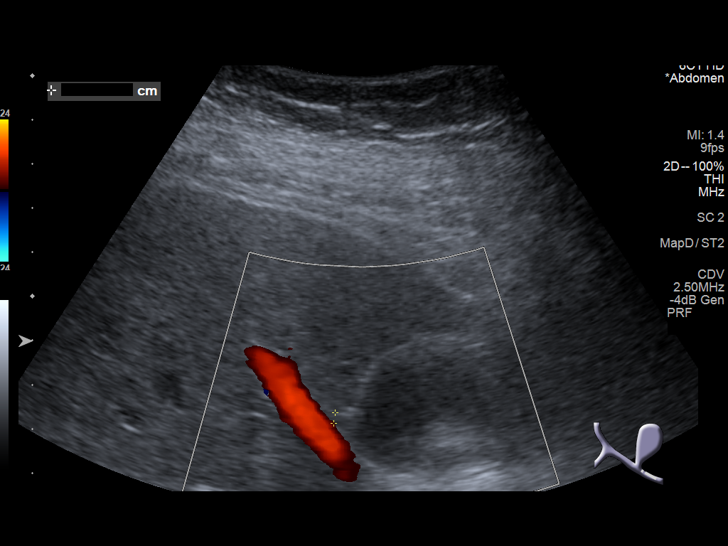
[im 21/46]
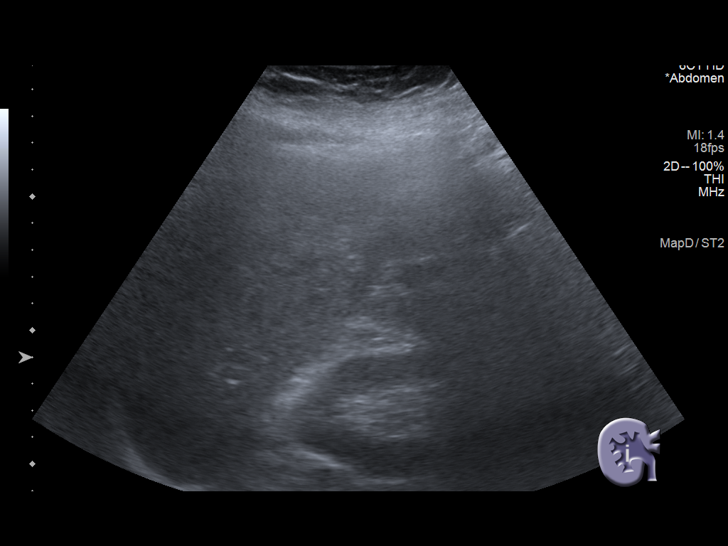
[im 25/46]
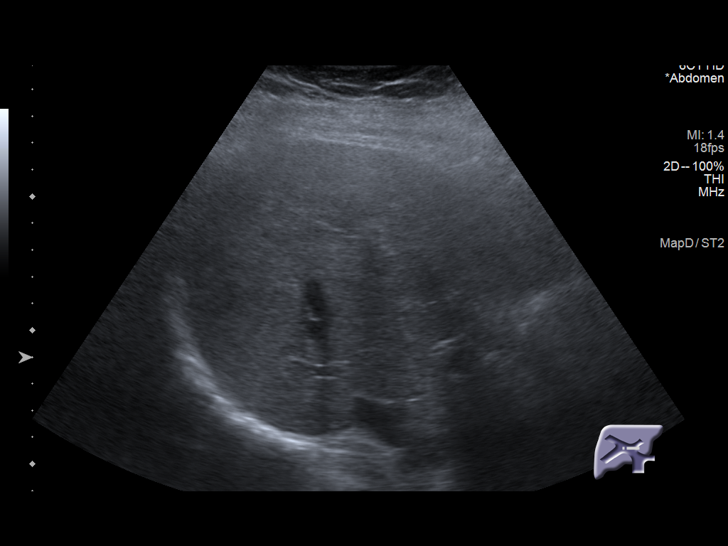
[im 29/46]
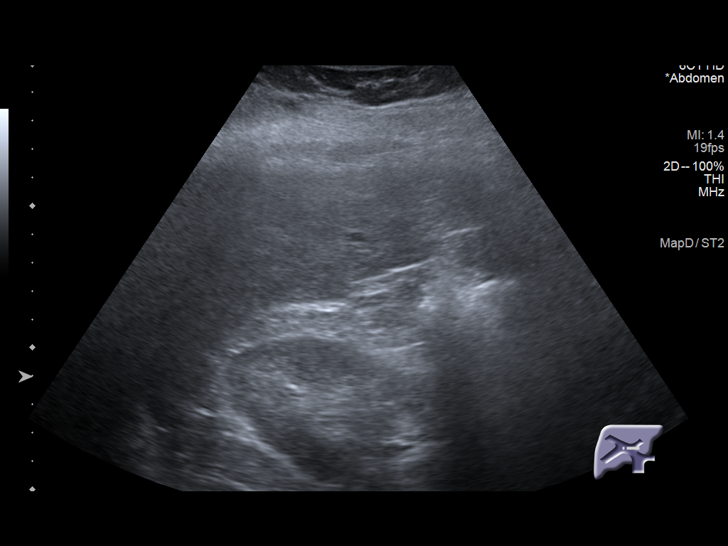
[im 31/46]
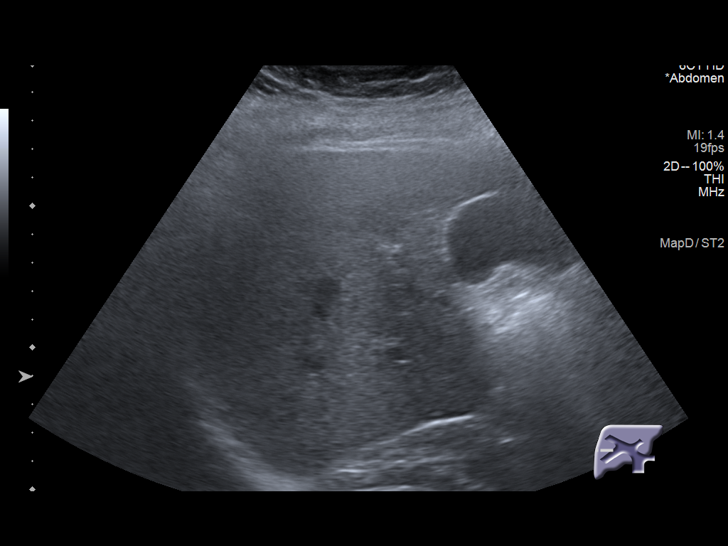
[im 34/46]
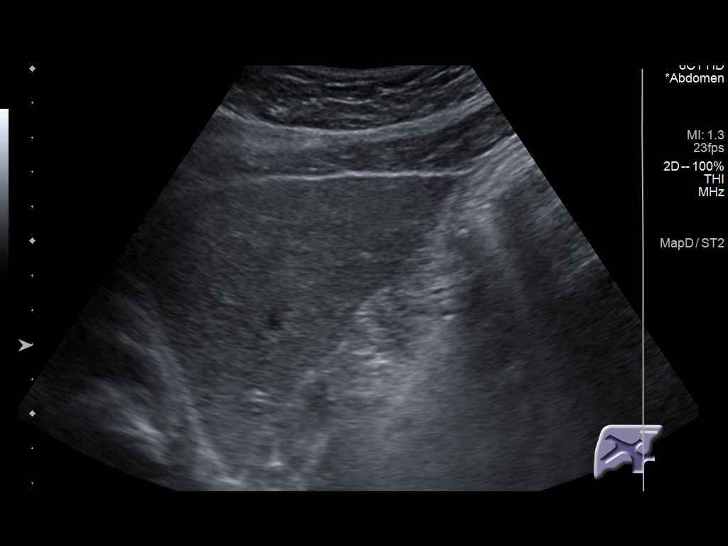
[im 38/46]
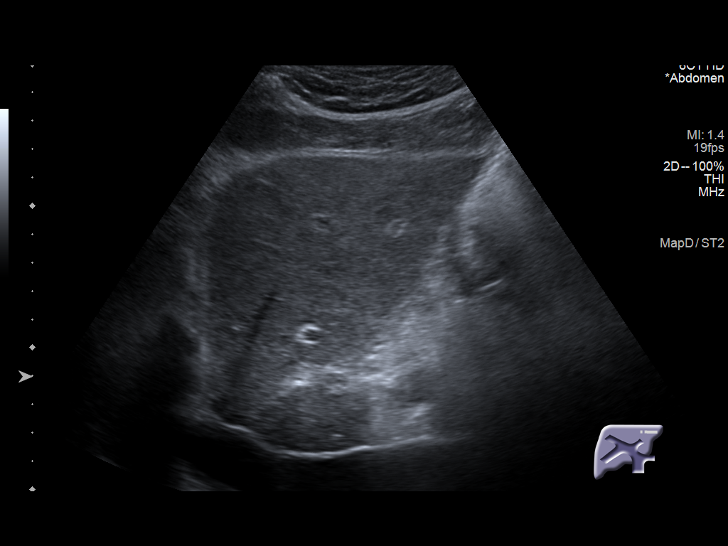
[im 42/46]
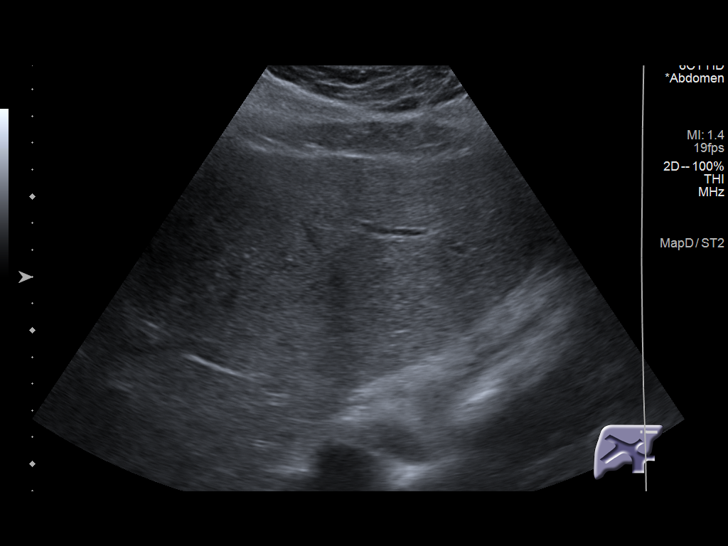
[im 46/46]
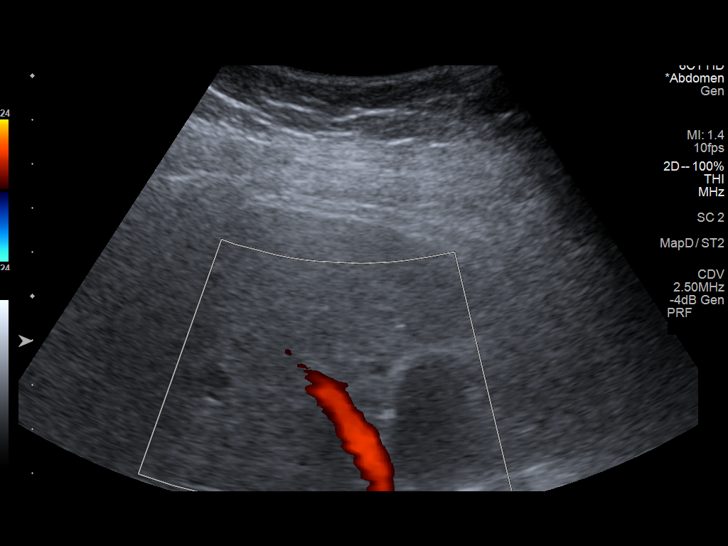

[14 of 25 positions shown; findings below may reference images not displayed]

FINDINGS: Gallbladder:

No gallstones or wall thickening visualized. No sonographic Murphy
sign noted by sonographer.

Common bile duct:

Diameter: 3.1 mm

Liver:

Diffusely increased in echogenicity stable from the prior exam. No
focal mass is noted. Portal vein is patent on color Doppler imaging
with normal direction of blood flow towards the liver.
IMPRESSION: Fuse increased echogenicity of the liver consistent with fatty
infiltration or underlying cirrhosis. No definitive mass is noted.
No significant nodularity is seen.

## 2019-09-24 DIAGNOSIS — G40909 Epilepsy, unspecified, not intractable, without status epilepticus: Secondary | ICD-10-CM | POA: Diagnosis not present

## 2019-09-24 DIAGNOSIS — E669 Obesity, unspecified: Secondary | ICD-10-CM | POA: Diagnosis not present

## 2019-09-24 DIAGNOSIS — N400 Enlarged prostate without lower urinary tract symptoms: Secondary | ICD-10-CM | POA: Diagnosis not present

## 2019-09-24 DIAGNOSIS — Z6833 Body mass index (BMI) 33.0-33.9, adult: Secondary | ICD-10-CM | POA: Diagnosis not present

## 2019-09-24 DIAGNOSIS — Z720 Tobacco use: Secondary | ICD-10-CM | POA: Diagnosis not present

## 2019-12-07 DIAGNOSIS — N400 Enlarged prostate without lower urinary tract symptoms: Secondary | ICD-10-CM | POA: Diagnosis not present

## 2019-12-07 DIAGNOSIS — G40309 Generalized idiopathic epilepsy and epileptic syndromes, not intractable, without status epilepticus: Secondary | ICD-10-CM | POA: Diagnosis not present

## 2019-12-07 DIAGNOSIS — Z1389 Encounter for screening for other disorder: Secondary | ICD-10-CM | POA: Diagnosis not present

## 2019-12-07 DIAGNOSIS — Z6841 Body Mass Index (BMI) 40.0 and over, adult: Secondary | ICD-10-CM | POA: Diagnosis not present

## 2019-12-07 DIAGNOSIS — R7309 Other abnormal glucose: Secondary | ICD-10-CM | POA: Diagnosis not present

## 2019-12-07 DIAGNOSIS — R945 Abnormal results of liver function studies: Secondary | ICD-10-CM | POA: Diagnosis not present

## 2019-12-07 DIAGNOSIS — E782 Mixed hyperlipidemia: Secondary | ICD-10-CM | POA: Diagnosis not present

## 2019-12-07 DIAGNOSIS — B182 Chronic viral hepatitis C: Secondary | ICD-10-CM | POA: Diagnosis not present

## 2019-12-07 DIAGNOSIS — Z0001 Encounter for general adult medical examination with abnormal findings: Secondary | ICD-10-CM | POA: Diagnosis not present

## 2020-01-04 ENCOUNTER — Ambulatory Visit (INDEPENDENT_AMBULATORY_CARE_PROVIDER_SITE_OTHER): Payer: Medicare PPO | Admitting: Nurse Practitioner

## 2020-01-16 ENCOUNTER — Encounter (INDEPENDENT_AMBULATORY_CARE_PROVIDER_SITE_OTHER): Payer: Self-pay | Admitting: Internal Medicine

## 2020-01-16 ENCOUNTER — Ambulatory Visit (INDEPENDENT_AMBULATORY_CARE_PROVIDER_SITE_OTHER): Payer: Medicare PPO | Admitting: Internal Medicine

## 2020-01-16 ENCOUNTER — Other Ambulatory Visit: Payer: Self-pay

## 2020-01-16 VITALS — BP 168/86 | HR 85 | Temp 97.3°F | Ht 71.5 in | Wt 280.4 lb

## 2020-01-16 DIAGNOSIS — R7989 Other specified abnormal findings of blood chemistry: Secondary | ICD-10-CM | POA: Diagnosis not present

## 2020-01-16 NOTE — Progress Notes (Signed)
Presenting complaint;  Follow-up for elevated transaminases.  Database and subjective:  Patient is 69 year old African-American male who has history of hepatitis C for which he has been treated with sustained virologic response/cure who has remained with mildly elevated transaminases.  Biochemical markers for other diseases were negative.  Imaging studies suggest fatty liver. He returns for follow-up visit.  He was last seen in October 2020. He has no complaints.  He has good appetite.  He has gained 10 pounds since his last visit.  He states he quit cigarette smoking on 12/21/2019.  He denies abdominal pain pruritus or fatigue.  Bowels move daily.  He denies melena or rectal bleeding.  He is up-to-date on screening for CRC.  Last colonoscopy was in September 2013. He does not drink alcohol. He has history of seizure disorder but he has not been on medication for few years and and has not had any seizures. Patient says he was going to [facility] at least 4 times a week until onset of pandemic. He has received both doses of Covid vaccine. He had blood work by Dr. Hilma Favors on 12/07/2019 which is reviewed below.  Current Medications: Outpatient Encounter Medications as of 01/16/2020  Medication Sig  . calcium carbonate (OS-CAL) 600 MG TABS Take 600 mg by mouth daily. Calcium 600mg  with Vitamin D  . multivitamin-iron-minerals-folic acid (THERAPEUTIC-M) TABS tablet Take 1 tablet by mouth daily.  . tamsulosin (FLOMAX) 0.4 MG CAPS capsule Take 0.4 mg by mouth daily.    No facility-administered encounter medications on file as of 01/16/2020.     Objective: Blood pressure (!) 168/86, pulse 85, temperature (!) 97.3 F (36.3 C), temperature source Temporal, height 5' 11.5" (1.816 m), weight 280 lb 6.4 oz (127.2 kg). Patient is alert and in no acute distress. Patient is wearing facial mask. Conjunctiva is pink. Sclera is nonicteric Oropharyngeal mucosa is normal. He has upper lower dentures in place.  No neck  masses or thyromegaly noted. Cardiac exam with regular rhythm normal S1 and S2. No murmur or gallop noted. Lungs are clear to auscultation. Abdomen is obese.  On palpation soft and nontender without organomegaly or masses. No LE edema or clubbing noted.  Labs/studies Results:  CBC Latest Ref Rng & Units 07/13/2019 01/05/2019 07/05/2018  WBC 3.8 - 10.8 Thousand/uL 7.5 10.5 7.2  Hemoglobin 13.2 - 17.1 g/dL 14.9 14.9 14.7  Hematocrit 38.5 - 50.0 % 43.5 42.7 42.0  Platelets 140 - 400 Thousand/uL 165 165 165    CMP Latest Ref Rng & Units 05/24/2019 04/10/2019 01/05/2019  Glucose 70 - 99 mg/dL - - -  BUN 6 - 23 mg/dL - - -  Creatinine 0.50 - 1.35 mg/dL - - -  Sodium 135 - 145 mEq/L - - -  Potassium 3.5 - 5.1 mEq/L - - -  Chloride 96 - 112 mEq/L - - -  CO2 19 - 32 mEq/L - - -  Calcium 8.4 - 10.5 mg/dL - - -  Total Protein 6.1 - 8.1 g/dL 6.6 6.9 6.5  Total Bilirubin 0.2 - 1.2 mg/dL 0.8 1.0 0.6  AST 10 - 35 U/L 62(H) 78(H) 90(H)  ALT 9 - 46 U/L 83(H) 98(H) 114(H)    Hepatic Function Latest Ref Rng & Units 05/24/2019 04/10/2019 01/05/2019  Total Protein 6.1 - 8.1 g/dL 6.6 6.9 6.5  AST 10 - 35 U/L 62(H) 78(H) 90(H)  ALT 9 - 46 U/L 83(H) 98(H) 114(H)  Total Bilirubin 0.2 - 1.2 mg/dL 0.8 1.0 0.6  Bilirubin, Direct 0.0 -  0.2 mg/dL 0.2 0.2 0.1    Lab data from 12/07/2019  WBC 7.6 H&H 15.2 and 43.5 Platelet count 179K  Glucose 108 BUN 12 and creatinine 0.92 Serum sodium 139, potassium 4.5, chloride 104, CO2 23 Serum calcium 9.7 Bilirubin 0.7 AP 90, AST 83, ALT 95, total protein 7 and albumin 4.3.  TSH 2.420.  Hemoglobin A1c 6.0.   Assessment:  #1.  Mildly elevated transaminases trending upwards in parallel with weight gain.  He does not have stigmata of chronic liver disease.  Patient needs to resume physical activity on regular basis and hopefully he can lose some weight.  Presuming elevated transaminases are due to fatty liver I would expect improvement with regular physical activity  and weight reduction.  #2.  History of hepatitis C.  He has been treated and essentially cured.  HCVRNA was undetectable and October 2020.  #3.  Patient is average risk for CRC.  Next colonoscopy would be in September 2023  Plan:  Patient advised to consider going back to [facility] now that [facility] is open for business.  His goal should be at least 2-1/2 hours of physical activity.  Gradually he can increase it to at least 4 hours/week. Patient also advised to get hepatitis A and B vaccination.  He will talk with Dr. Hilma Favors. LFTs in 3 months Office visit in 6 months.

## 2020-01-16 NOTE — Patient Instructions (Addendum)
Increase physical activity to at least 2-1/2 hours to 4 hours/week as discussed. LFTs in 3 months.  Office will remind you

## 2020-01-24 IMAGING — US US ABDOMEN LIMITED
1 series · 14 of 25 positions shown · non-contrast
Comparison: Ultrasound February 20, 2019.

CLINICAL DATA: Hepatitis C infection.

EXAM:
ULTRASOUND ABDOMEN LIMITED RIGHT UPPER QUADRANT

[Series 1: us abdomen limited · 0.17mm/px · 14 of 85 slices shown]
[im 1/85]
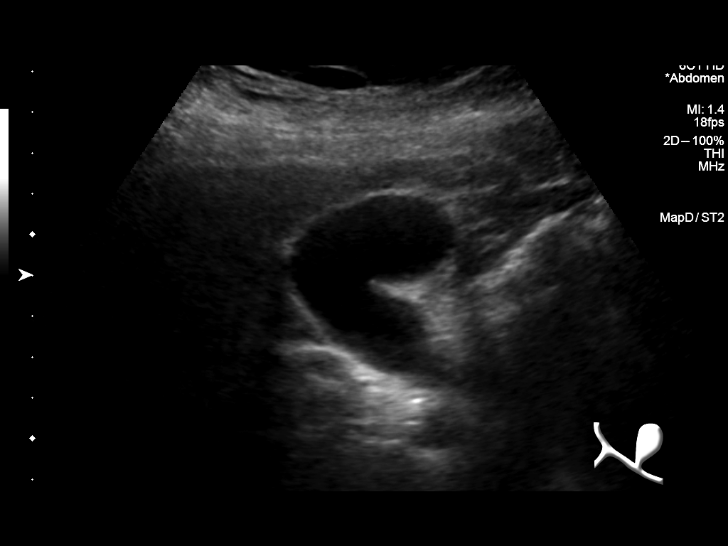
[im 8/85]
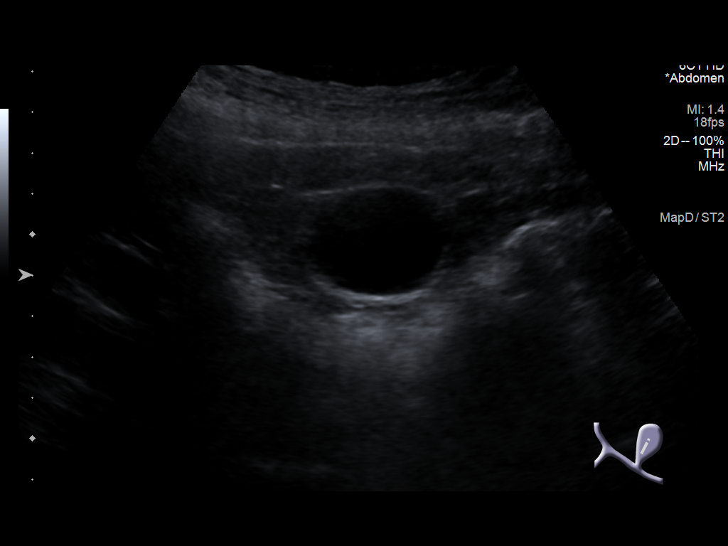
[im 15/85]
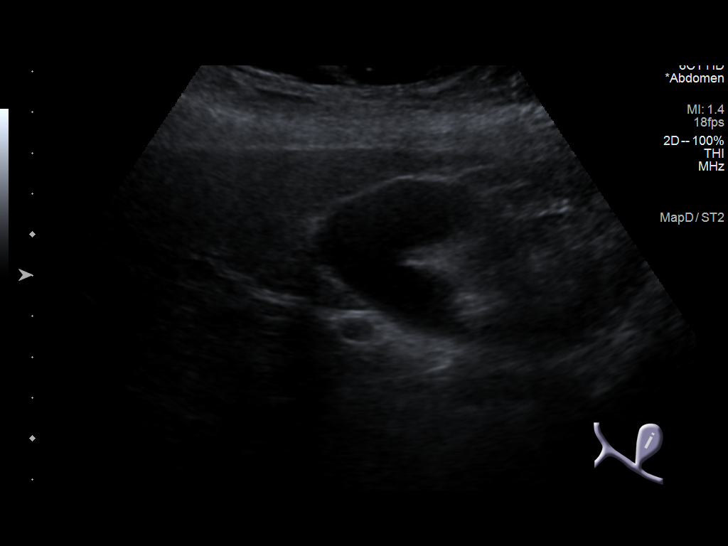
[im 22/85]
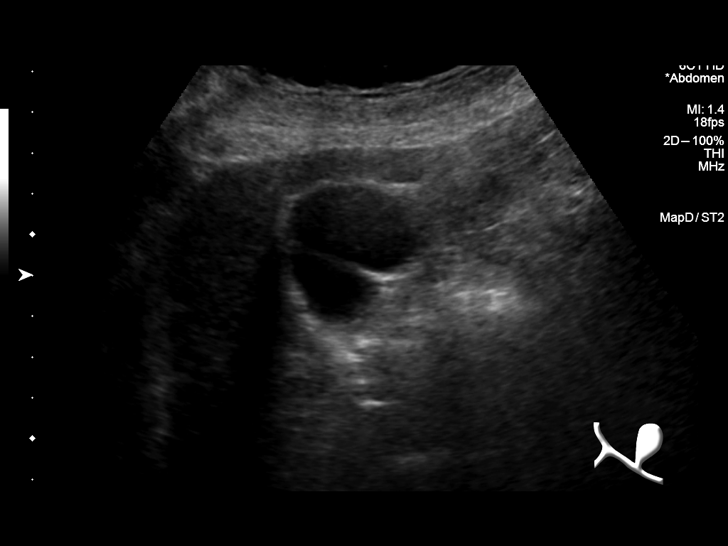
[im 29/85]
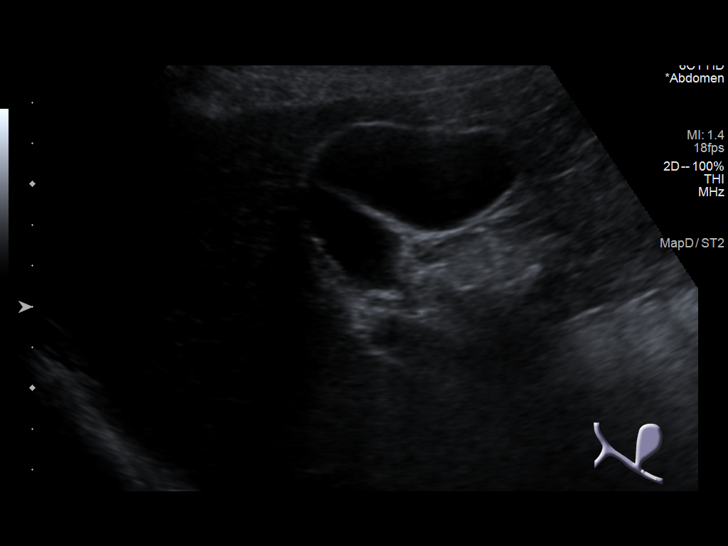
[im 32/85]
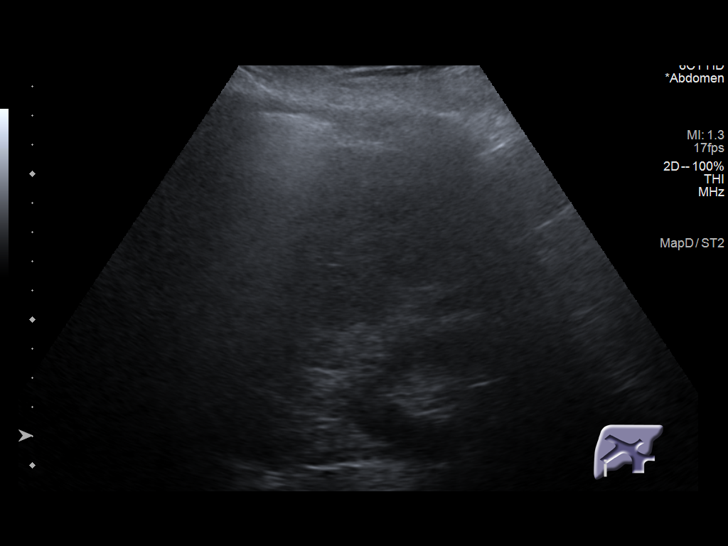
[im 39/85]
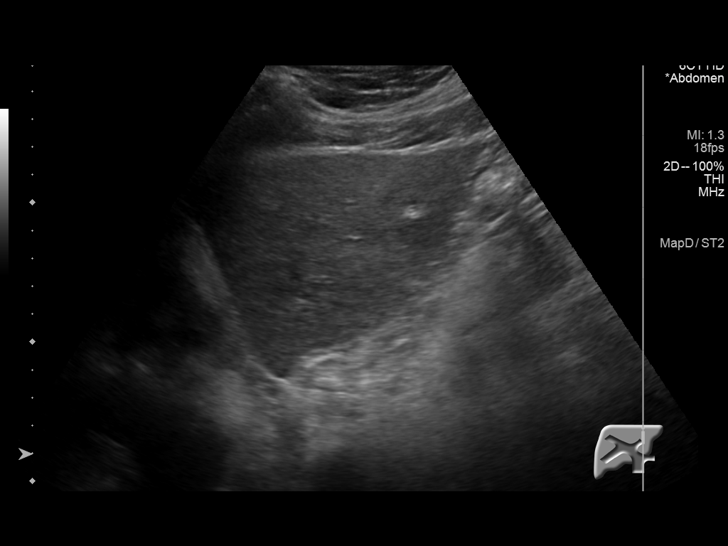
[im 46/85]
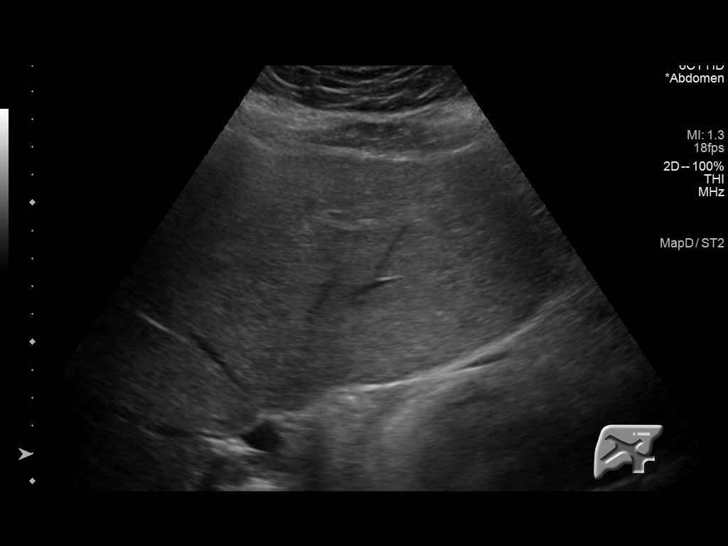
[im 53/85]
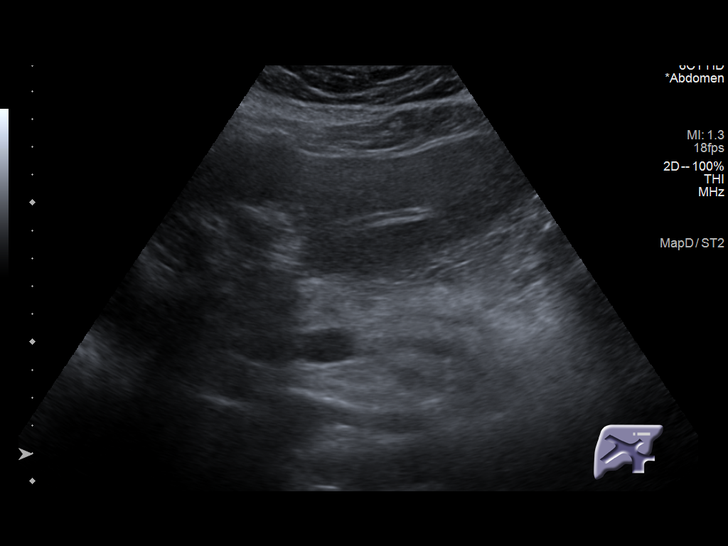
[im 57/85]
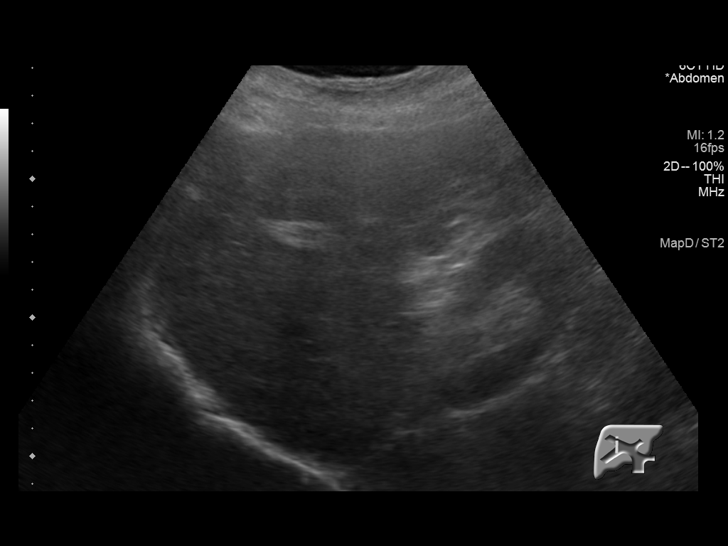
[im 64/85]
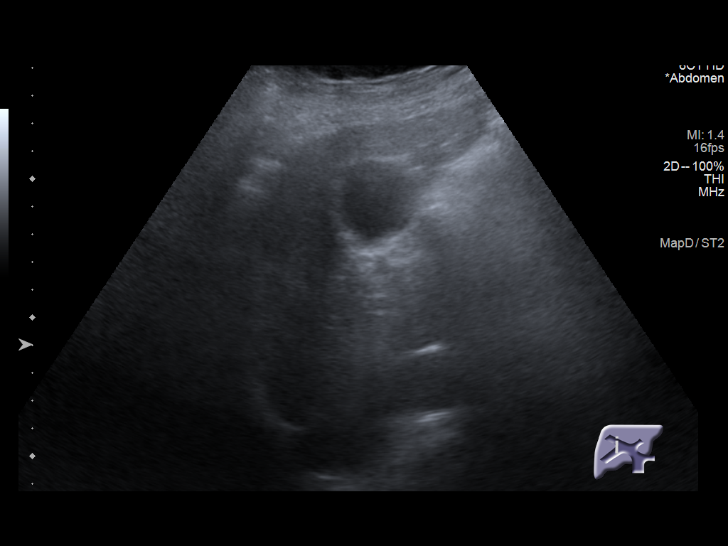
[im 71/85]
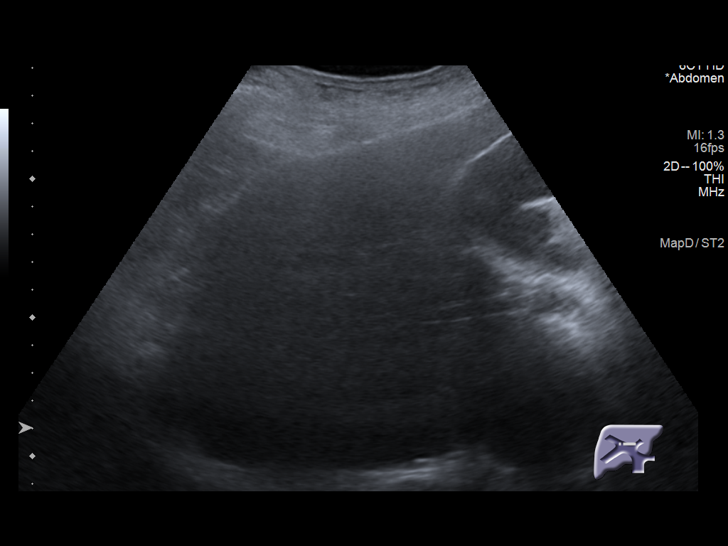
[im 78/85]
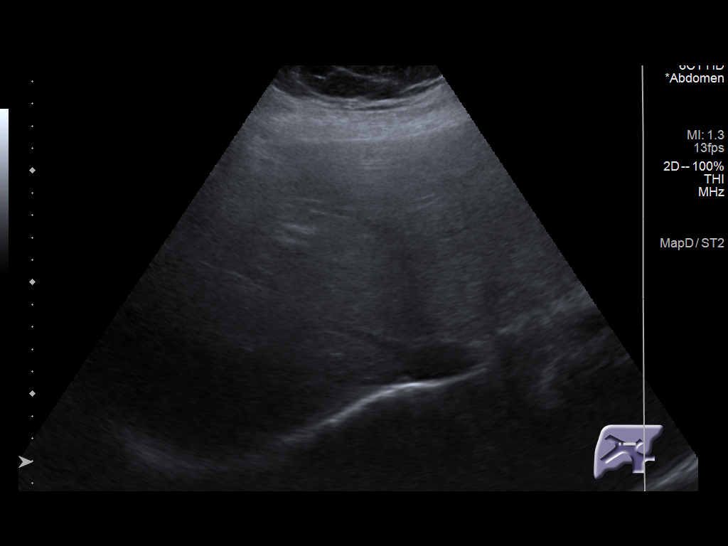
[im 85/85]
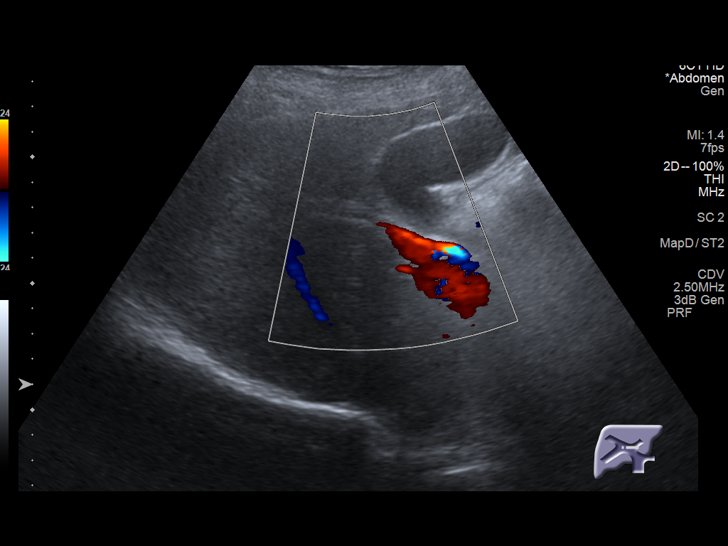

[14 of 25 positions shown; findings below may reference images not displayed]

FINDINGS: Gallbladder:

No gallstones or wall thickening visualized. No sonographic Murphy
sign noted by sonographer.

Common bile duct:

Diameter: 3 mm which is within normal limits.

Liver:

No focal lesion identified. Increased echogenicity of hepatic
parenchyma is noted. Portal vein is patent on color Doppler imaging
with normal direction of blood flow towards the liver.

Other: None.
IMPRESSION: Increased echogenicity of hepatic parenchyma is noted consistent
with hepatic steatosis or diffuse hepatocellular disease. No focal
sonographic hepatic abnormality is noted.

## 2020-01-31 ENCOUNTER — Ambulatory Visit (INDEPENDENT_AMBULATORY_CARE_PROVIDER_SITE_OTHER): Payer: Medicare PPO | Admitting: Gastroenterology

## 2020-04-10 ENCOUNTER — Other Ambulatory Visit (INDEPENDENT_AMBULATORY_CARE_PROVIDER_SITE_OTHER): Payer: Self-pay | Admitting: *Deleted

## 2020-04-10 DIAGNOSIS — R7989 Other specified abnormal findings of blood chemistry: Secondary | ICD-10-CM

## 2020-04-17 DIAGNOSIS — R7989 Other specified abnormal findings of blood chemistry: Secondary | ICD-10-CM | POA: Diagnosis not present

## 2020-04-18 LAB — HEPATIC FUNCTION PANEL
AG Ratio: 1.5 (calc) (ref 1.0–2.5)
ALT: 77 U/L — ABNORMAL HIGH (ref 9–46)
AST: 60 U/L — ABNORMAL HIGH (ref 10–35)
Albumin: 4 g/dL (ref 3.6–5.1)
Alkaline phosphatase (APISO): 79 U/L (ref 35–144)
Bilirubin, Direct: 0.2 mg/dL (ref 0.0–0.2)
Globulin: 2.7 g/dL (calc) (ref 1.9–3.7)
Indirect Bilirubin: 0.4 mg/dL (calc) (ref 0.2–1.2)
Total Bilirubin: 0.6 mg/dL (ref 0.2–1.2)
Total Protein: 6.7 g/dL (ref 6.1–8.1)

## 2020-04-19 ENCOUNTER — Other Ambulatory Visit (INDEPENDENT_AMBULATORY_CARE_PROVIDER_SITE_OTHER): Payer: Self-pay

## 2020-04-19 DIAGNOSIS — R748 Abnormal levels of other serum enzymes: Secondary | ICD-10-CM

## 2020-04-19 DIAGNOSIS — R7989 Other specified abnormal findings of blood chemistry: Secondary | ICD-10-CM

## 2020-04-19 NOTE — Progress Notes (Signed)
Order put in for patient to have lft done on 07/15/20, reminder was sent to tammy ,lpn.

## 2020-07-03 ENCOUNTER — Other Ambulatory Visit (INDEPENDENT_AMBULATORY_CARE_PROVIDER_SITE_OTHER): Payer: Self-pay | Admitting: *Deleted

## 2020-07-03 DIAGNOSIS — R7989 Other specified abnormal findings of blood chemistry: Secondary | ICD-10-CM

## 2020-07-03 DIAGNOSIS — R748 Abnormal levels of other serum enzymes: Secondary | ICD-10-CM

## 2020-07-10 DIAGNOSIS — R748 Abnormal levels of other serum enzymes: Secondary | ICD-10-CM | POA: Diagnosis not present

## 2020-07-10 DIAGNOSIS — R7989 Other specified abnormal findings of blood chemistry: Secondary | ICD-10-CM | POA: Diagnosis not present

## 2020-07-11 LAB — HEPATIC FUNCTION PANEL
AG Ratio: 1.6 (calc) (ref 1.0–2.5)
ALT: 85 U/L — ABNORMAL HIGH (ref 9–46)
AST: 80 U/L — ABNORMAL HIGH (ref 10–35)
Albumin: 4.1 g/dL (ref 3.6–5.1)
Alkaline phosphatase (APISO): 65 U/L (ref 35–144)
Bilirubin, Direct: 0.2 mg/dL (ref 0.0–0.2)
Globulin: 2.6 g/dL (calc) (ref 1.9–3.7)
Indirect Bilirubin: 0.6 mg/dL (calc) (ref 0.2–1.2)
Total Bilirubin: 0.8 mg/dL (ref 0.2–1.2)
Total Protein: 6.7 g/dL (ref 6.1–8.1)

## 2020-07-18 ENCOUNTER — Other Ambulatory Visit: Payer: Self-pay

## 2020-07-18 ENCOUNTER — Ambulatory Visit (INDEPENDENT_AMBULATORY_CARE_PROVIDER_SITE_OTHER): Payer: Medicare HMO | Admitting: Gastroenterology

## 2020-07-18 ENCOUNTER — Encounter (INDEPENDENT_AMBULATORY_CARE_PROVIDER_SITE_OTHER): Payer: Self-pay | Admitting: Gastroenterology

## 2020-07-18 VITALS — BP 154/80 | HR 99 | Temp 97.5°F | Ht 71.5 in | Wt 287.4 lb

## 2020-07-18 DIAGNOSIS — R7989 Other specified abnormal findings of blood chemistry: Secondary | ICD-10-CM

## 2020-07-18 DIAGNOSIS — R635 Abnormal weight gain: Secondary | ICD-10-CM

## 2020-07-18 NOTE — Patient Instructions (Addendum)
I will request labs from Dr. Hilma Favors  Diet modifications to include lower fat foods and more fruits and vegetables as discussed

## 2020-07-18 NOTE — Progress Notes (Signed)
Patient profile: David [name] is a 69 y.o. male seen for follow-up of elevated liver enzymes. He was last seen May 2021. He has a history of hepatitis C with confirmed SVR.  He has continue to have mildly elevated transaminases which have trended up with weight gain.   History of Present Illness: David [name] is seen today for follow-up. He reports symptomatically doing well. He denies any nausea, vomiting, epigastric pain, or dysphagia.  He has no frequent GERD.  He is having his stools daily without any abdominal pain, constipation, diarrhea, melena, rectal bleeding.  He has unfortunately gained weight since his last visit.  He reports some issues with being physically active and limitations due to joint pain specifically knee pain.  He has been less active in general due to Covid as well.  We discussed his diet in detail-generally has sausage egg and cheese biscuit for breakfast that he makes at home, 2 hotdogs for lunch, does eat meat and vegetable for dinner typically.  He lives alone.  He does eat dinner some nights with a friend.  Has quite a bit of chips about the day.  Drinks 1 soda a day.  He stopped drinking when he was in his 44s.  Reports prior using alcohol 2-3 times a week.  He has prior daily alcohol use.  Denies frequent NSAIDs.  Wt Readings from Last 3 Encounters:  07/18/20 287 lb 6.4 oz (130.4 kg)  01/16/20 280 lb 6.4 oz (127.2 kg)  07/13/19 270 lb 12.8 oz (122.8 kg)     Last Colonoscopy: 2013 colonoscopy-normal   Past Medical History:  Past Medical History:  Diagnosis Date  . 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 Obstructive sleep apnea    transcribed from Dr. Freddie Apley note  . Pneumonia   . Seizure [facility])     Problem List: Patient Active Problem List   Diagnosis Date Noted  . Hepatitis C virus infection cured after antiviral drug therapy 07/13/2019  . Elevated LFTs 07/13/2019  . Seizure disorder ([place]) 01/14/2012  .  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 COLONOSCOPY  05/31/2012   Procedure: COLONOSCOPY;  Surgeon: Jamesetta So, MD;  [facility];  Service: Gastroenterology;  Laterality: N/A;  . TONSILLECTOMY      Allergies: No Known Allergies    Home Medications:  Current Outpatient Medications:  .  calcium carbonate (OS-CAL) 600 MG TABS, Take 600 mg by mouth daily. Calcium 664m with Vitamin D, Disp: , Rfl:  .  multivitamin-iron-minerals-folic acid (THERAPEUTIC-M) TABS tablet, Take 1 tablet by mouth daily., Disp: , Rfl:  .  tamsulosin (FLOMAX) 0.4 MG CAPS capsule, Take 0.4 mg by mouth daily. , Disp: , Rfl:    Family History: Reports strong family history of diabetes. Denies any liver disease or colon cancer in family  Social History:   reports that he quit smoking about 6 months ago. His smoking use included cigarettes. He has never used smokeless tobacco. He reports that he does not drink alcohol and does not use drugs.   Review of Systems: Constitutional: Denies weight loss/weight gain  Eyes: No changes in vision. ENT: No oral lesions, sore throat.  GI: see HPI.  Heme/Lymph: No easy bruising.  CV: No chest pain.  GU: No hematuria.  Integumentary: No rashes.  Neuro: No headaches.  Psych: No depression/anxiety.  Endocrine: No heat/cold intolerance.  Allergic/Immunologic: No  urticaria.  Resp: No cough, SOB.  Musculoskeletal: No joint swelling.    Physical Examination: BP (!) 154/80 (BP Location: Right Arm, Patient Position: Sitting, Cuff Size: Large)   Pulse 99   Temp (!) 97.5 F (36.4 C) (Oral)   Ht 5' 11.5" (1.816 m)   Wt 287 lb 6.4 oz (130.4 kg)   BMI 39.53 kg/m  Gen: NAD, alert and oriented x 4 HEENT: PEERLA, EOMI, Neck: supple, no JVD Chest: CTA bilaterally, no wheezes, crackles, or other adventitious sounds CV: RRR, no m/g/c/r Abd: soft, NT, ND, +BS in all four quadrants; no HSM,  guarding, ridigity, or rebound tenderness Ext: no edema, well perfused with 2+ pulses, Skin: no rash or lesions noted on observed skin Lymph: no noted LAD  Data Reviewed:  07/10/2020 labs-AST 80, ALT 85 normal bili and alk phos  04/2020-AST 60, ALT 77 normal bili and alk phos  07/04/2019 labs-CBC with normal hemoglobin and platelets.  HCVRNA negative.  GGT normal.  Mitochondrial smooth muscle antibodies negative.  Iron normal.  Not immune to hepatitis A or B.    Assessment/Plan: David [name] is a 69 y.o. male   1.  Elevated liver enzymes-these have continued to elevated as he has gained weight. We did discuss some diet modifications today and decreasing his intake of fatty greasy foods. He will try to incorporate more fresh fruits and vegetables into his diet.  Encouraged physical activity but this seems limited by joint issues. He had labs with his primary care and will request his lipid panel and A1c.   2.  Colonoscopy-normal in 2013, due 2023  David [name] was seen today for follow-up.  Diagnoses and all orders for this visit:  Elevated LFTs  Weight gain   I personally performed the service, non-incident to. (WP)  Laurine Blazer, [facility]

## 2020-07-21 NOTE — Addendum Note (Signed)
Addended by: Rogene Houston on: 07/21/2020 10:36 PM   Modules accepted: Orders

## 2020-12-09 DIAGNOSIS — N400 Enlarged prostate without lower urinary tract symptoms: Secondary | ICD-10-CM | POA: Diagnosis not present

## 2020-12-09 DIAGNOSIS — Z0001 Encounter for general adult medical examination with abnormal findings: Secondary | ICD-10-CM | POA: Diagnosis not present

## 2020-12-09 DIAGNOSIS — Z720 Tobacco use: Secondary | ICD-10-CM | POA: Diagnosis not present

## 2020-12-09 DIAGNOSIS — Z1389 Encounter for screening for other disorder: Secondary | ICD-10-CM | POA: Diagnosis not present

## 2020-12-09 DIAGNOSIS — G40309 Generalized idiopathic epilepsy and epileptic syndromes, not intractable, without status epilepticus: Secondary | ICD-10-CM | POA: Diagnosis not present

## 2020-12-09 DIAGNOSIS — R7309 Other abnormal glucose: Secondary | ICD-10-CM | POA: Diagnosis not present

## 2020-12-09 DIAGNOSIS — Z1331 Encounter for screening for depression: Secondary | ICD-10-CM | POA: Diagnosis not present

## 2020-12-09 DIAGNOSIS — E782 Mixed hyperlipidemia: Secondary | ICD-10-CM | POA: Diagnosis not present

## 2020-12-09 DIAGNOSIS — F17210 Nicotine dependence, cigarettes, uncomplicated: Secondary | ICD-10-CM | POA: Diagnosis not present

## 2020-12-09 DIAGNOSIS — Z6841 Body Mass Index (BMI) 40.0 and over, adult: Secondary | ICD-10-CM | POA: Diagnosis not present

## 2021-12-10 DIAGNOSIS — Z1331 Encounter for screening for depression: Secondary | ICD-10-CM | POA: Diagnosis not present

## 2021-12-10 DIAGNOSIS — B182 Chronic viral hepatitis C: Secondary | ICD-10-CM | POA: Diagnosis not present

## 2021-12-10 DIAGNOSIS — N400 Enlarged prostate without lower urinary tract symptoms: Secondary | ICD-10-CM | POA: Diagnosis not present

## 2021-12-10 DIAGNOSIS — Z6841 Body Mass Index (BMI) 40.0 and over, adult: Secondary | ICD-10-CM | POA: Diagnosis not present

## 2021-12-10 DIAGNOSIS — G40309 Generalized idiopathic epilepsy and epileptic syndromes, not intractable, without status epilepticus: Secondary | ICD-10-CM | POA: Diagnosis not present

## 2021-12-10 DIAGNOSIS — R7309 Other abnormal glucose: Secondary | ICD-10-CM | POA: Diagnosis not present

## 2021-12-10 DIAGNOSIS — R945 Abnormal results of liver function studies: Secondary | ICD-10-CM | POA: Diagnosis not present

## 2021-12-10 DIAGNOSIS — E782 Mixed hyperlipidemia: Secondary | ICD-10-CM | POA: Diagnosis not present

## 2021-12-10 DIAGNOSIS — Z0001 Encounter for general adult medical examination with abnormal findings: Secondary | ICD-10-CM | POA: Diagnosis not present

## 2022-06-08 ENCOUNTER — Encounter (HOSPITAL_COMMUNITY): Payer: Self-pay

## 2022-06-08 ENCOUNTER — Emergency Department (HOSPITAL_COMMUNITY)
Admission: EM | Admit: 2022-06-08 | Discharge: 2022-06-08 | Disposition: A | Discharge status: Home / Self Care | Payer: Medicare HMO | Attending: Emergency Medicine | Admitting: Emergency Medicine

## 2022-06-08 ENCOUNTER — Other Ambulatory Visit: Payer: Self-pay

## 2022-06-08 DIAGNOSIS — R319 Hematuria, unspecified: Secondary | ICD-10-CM

## 2022-06-08 DIAGNOSIS — R5381 Other malaise: Secondary | ICD-10-CM | POA: Diagnosis not present

## 2022-06-08 DIAGNOSIS — R000 Tachycardia, unspecified: Secondary | ICD-10-CM | POA: Diagnosis not present

## 2022-06-08 DIAGNOSIS — R0689 Other abnormalities of breathing: Secondary | ICD-10-CM | POA: Diagnosis not present

## 2022-06-08 DIAGNOSIS — Z87891 Personal history of nicotine dependence: Secondary | ICD-10-CM | POA: Insufficient documentation

## 2022-06-08 DIAGNOSIS — I10 Essential (primary) hypertension: Secondary | ICD-10-CM | POA: Diagnosis not present

## 2022-06-08 LAB — URINALYSIS, ROUTINE W REFLEX MICROSCOPIC
Bacteria, UA: NONE SEEN
Bilirubin Urine: NEGATIVE
Glucose, UA: 50 mg/dL — AB
Ketones, ur: NEGATIVE mg/dL
Leukocytes,Ua: NEGATIVE
Nitrite: NEGATIVE
Protein, ur: >=300 mg/dL — AB
RBC / HPF: >50 RBC/hpf — ABNORMAL HIGH (ref 0–5)
Specific Gravity, Urine: 1.012 (ref 1.005–1.030)
WBC, UA: >50 WBC/hpf — ABNORMAL HIGH (ref 0–5)
pH: 6 (ref 5.0–8.0)

## 2022-06-08 MED ORDER — CEPHALEXIN 500 MG PO CAPS: 500.0000 mg | ORAL_CAPSULE | Freq: Three times a day (TID) | ORAL | 0 refills | Status: DC

## 2022-06-08 MED ORDER — CEPHALEXIN 500 MG PO CAPS: 500.0000 mg | ORAL_CAPSULE | Freq: Three times a day (TID) | ORAL | 0 refills | Status: AC

## 2022-06-08 NOTE — ED Triage Notes (Signed)
Pt presents with hematuria that started around 3. Pt denies any pain but does states that it burns when he urinates. Pt states that he does not know if his bladder is fully empty due to only urinating small amounts.

## 2022-06-08 NOTE — Discharge Instructions (Signed)
You were evaluated in the Emergency Department and after careful evaluation, we did not find any emergent condition requiring admission or further testing in the hospital.  Your exam/testing today is overall reassuring.  Symptoms may be due to a bladder infection.  Take the Keflex antibiotic as directed.  If the antibiotics do not resolve your bleeding recommend follow-[facility] with [facility].  Please return to the Emergency Department if you experience any worsening of your condition.   Thank you for allowing [place] to be a part of your care.

## 2022-06-08 NOTE — ED Provider Notes (Signed)
[facility] Emergency Department Provider Note MRN:  [id number]  Arrival date & time: 06/08/22     Chief Complaint   Hematuria   History of Present Illness   David [name] is a 71 y.o. year-old male with no pertinent past medical history presenting to the ED with chief complaint of hematuria.  Got up to pee this evening and noticed blood.  Straining to urinate, trouble getting the urine out, thinks it is because of blood clots.  Some burning with urination.  No abdominal pain, no other symptoms.  Review of Systems  A thorough review of systems was obtained and all systems are negative except as noted in the HPI and PMH.   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 Freddie Apley note   Pneumonia    Seizure [facility])     Past Surgical History:  Procedure Laterality Date   COLONOSCOPY  05/31/2012   Procedure: COLONOSCOPY;  Surgeon: Jamesetta So, MD;  [facility];  Service: Gastroenterology;  Laterality: N/A;   TONSILLECTOMY      No family history on file.  Social History   Socioeconomic History   Marital status: Divorced    Spouse name: Not on file   Number of children: Not on file   Years of education: Not on file   Highest education level: Not on file  Occupational History   Not on file  Tobacco Use   Smoking status: Former    Types: Cigarettes    Quit date: 12/21/2019    Years since quitting: 2.4   Smokeless tobacco: Never  Substance and Sexual Activity   Alcohol use: No   Drug use: No   Sexual activity: Not on file  Other Topics Concern   Not on file  Social History Narrative   Not on file   Social Determinants of Health   Financial Resource Strain: Not on file  Food Insecurity: Not on file  Transportation Needs: Not on file  Physical Activity: Not on file  Stress: Not on file  Social  Connections: Not on file  Intimate Partner Violence: Not on file     Physical Exam   Vitals:   06/08/22 0334 06/08/22 0558  BP: (!) 185/89 (!) 177/89  Pulse: (!) 101 (!) 104  Resp: 19 (!) 21  Temp: 98 F (36.7 C) 98.5 F (36.9 C)  SpO2: 98% 98%    CONSTITUTIONAL: Well-appearing, NAD NEURO/PSYCH:  Alert and oriented x 3, no focal deficits EYES:  eyes equal and reactive ENT/NECK:  no LAD, no JVD CARDIO: Regular rate, well-perfused, normal S1 and S2 PULM:  CTAB no wheezing or rhonchi GI/GU:  non-distended, non-tender MSK/SPINE:  No gross deformities, no edema SKIN:  no rash, atraumatic   *Additional and/or pertinent findings included in MDM below  Diagnostic and Interventional Summary    EKG Interpretation  Date/Time:    Ventricular Rate:    PR Interval:    QRS Duration:   QT Interval:    QTC Calculation:   R Axis:     Text Interpretation:         Labs Reviewed  URINALYSIS, ROUTINE W REFLEX MICROSCOPIC - Abnormal; Notable for the following components:      Result Value   Color, Urine AMBER (*)    APPearance HAZY (*)  Glucose, UA 50 (*)    Hgb urine dipstick LARGE (*)    Protein, ur >=300 (*)    RBC / HPF >50 (*)    WBC, UA >50 (*)    All other components within normal limits  URINE CULTURE    No orders to display    Medications - No data to display   Procedures  /  Critical Care Procedures  ED Course and Medical Decision Making  Initial Impression and Ddx Abdomen soft and nontender, having hematuria, burning with urination, frank blood.  Considering UTI, awaiting urinalysis.  Past medical/surgical history that increases complexity of ED encounter: None  Interpretation of Diagnostics Urinalysis with some evidence to suggest infection  Patient Reassessment and Ultimate Disposition/Management     We will treat with Keflex and refer to urology if bleeding does not stop.  Patient management required discussion with the following services or  consulting groups:  None  Complexity of Problems Addressed Acute complicated illness or Injury  Additional Data Reviewed and Analyzed Further history obtained from: None  Additional Factors Impacting ED Encounter Risk Prescriptions  Barth Kirks. Sedonia Small, MD Octa [email or web address]  Final Clinical Impressions(s) / ED Diagnoses     ICD-10-CM   1. Hematuria, unspecified type  R31.9       ED Discharge Orders          Ordered    cephALEXin (KEFLEX) 500 MG capsule  3 times daily,   Status:  Discontinued        06/08/22 0555    cephALEXin (KEFLEX) 500 MG capsule  3 times daily        06/08/22 0556             Discharge Instructions Discussed with and Provided to Patient:     Discharge Instructions      You were evaluated in the Emergency Department and after careful evaluation, we did not find any emergent condition requiring admission or further testing in the hospital.  Your exam/testing today is overall reassuring.  Symptoms may be due to a bladder infection.  Take the Keflex antibiotic as directed.  If the antibiotics do not resolve your bleeding recommend follow-up with the [facility].  Please return to the Emergency Department if you experience any worsening of your condition.   Thank you for allowing [place] to be a part of your care.       Maudie Flakes, MD 06/08/22 [phone]

## 2022-06-09 LAB — URINE CULTURE: Culture: 20000 — AB

## 2022-06-10 ENCOUNTER — Telehealth (HOSPITAL_BASED_OUTPATIENT_CLINIC_OR_DEPARTMENT_OTHER): Payer: Self-pay | Admitting: *Deleted

## 2022-06-10 NOTE — Telephone Encounter (Signed)
Post ED Visit - Positive Culture Follow-up  Culture report reviewed by antimicrobial stewardship pharmacist: [place] Team '[]'$  Elenor Quinones, Pharm.D. '[]'$  Heide Guile, Pharm.D., BCPS AQ-ID '[]'$  Parks Neptune, Pharm.D., BCPS '[]'$  Alycia Rossetti, Pharm.D., BCPS '[]'$  [place], Pharm.D., BCPS, AAHIVP '[]'$  Legrand Como, Pharm.D., BCPS, AAHIVP '[]'$  Salome Arnt, PharmD, BCPS '[]'$  Johnnette Gourd, PharmD, BCPS '[]'$  Hughes Better, PharmD, BCPS '[]'$  Leeroy Cha, PharmD '[]'$  Laqueta Linden, PharmD, BCPS '[]'$  Albertina Parr, PharmD  [place] Team '[]'$  Leodis Sias, PharmD '[]'$  Lindell Spar, PharmD '[]'$  Royetta Asal, PharmD '[]'$  Graylin Shiver, Rph '[]'$  Rema Fendt) Glennon Mac, PharmD '[]'$  Arlyn Dunning, PharmD '[]'$  Netta Cedars, PharmD '[]'$  Dia Sitter, PharmD '[]'$  Leone Haven, PharmD '[]'$  Gretta Arab, PharmD '[]'$  Theodis Shove, PharmD '[]'$  Peggyann Juba, PharmD '[]'$  Reuel Boom, PharmD   Positive urine culture Treated with Cephalexin, organism sensitive to the same and no further patient follow-up is required at this time.  Terrall Laity, PharmD  David [name] 06/10/2022, 10:51 AM

## 2022-06-12 DIAGNOSIS — E6609 Other obesity due to excess calories: Secondary | ICD-10-CM | POA: Diagnosis not present

## 2022-06-12 DIAGNOSIS — Z6839 Body mass index (BMI) 39.0-39.9, adult: Secondary | ICD-10-CM | POA: Diagnosis not present

## 2022-06-12 DIAGNOSIS — G40309 Generalized idiopathic epilepsy and epileptic syndromes, not intractable, without status epilepticus: Secondary | ICD-10-CM | POA: Diagnosis not present

## 2022-06-12 DIAGNOSIS — N400 Enlarged prostate without lower urinary tract symptoms: Secondary | ICD-10-CM | POA: Diagnosis not present

## 2022-06-12 DIAGNOSIS — R310 Gross hematuria: Secondary | ICD-10-CM | POA: Diagnosis not present

## 2022-06-15 NOTE — Progress Notes (Signed)
H&P  Chief Complaint: Blood in urine  History of Present Illness: 71 year old male presents for follow-up of gross hematuria.  The patient presented to the emergency room approximately 9 days ago with a 2-day history of gross hematuria, painless.  He was passing clots.  In the emergency room he was noted to have gross hematuria, felt to be possible urinary tract infection.  He was sent home on Keflex.  Urine culture revealed slight growth of strep.  Over the past 3 days he has not had any further gross hematuria or lower urinary tract symptoms.  He does have a remote history of gross hematuria with a UTI in the past.  He is a smoker.  He has had no abdominal or flank complaints.  No prior history of kidney stones.  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 Freddie Apley note   Pneumonia    Seizure [facility])     Past Surgical History:  Procedure Laterality Date   COLONOSCOPY  05/31/2012   Procedure: COLONOSCOPY;  Surgeon: Jamesetta So, MD;  Location: [facility];  Service: Gastroenterology;  Laterality: N/A;   TONSILLECTOMY      Home Medications:  Allergies as of 06/16/2022   No Known Allergies      Medication List        Accurate as of June 15, 2022  8:05 PM. If you have any questions, ask your nurse or doctor.          calcium carbonate 600 MG Tabs tablet Commonly known as: OS-CAL Take 600 mg by mouth daily. Calcium '600mg'$  with Vitamin D   multivitamin-iron-minerals-folic acid Tabs tablet Take 1 tablet by mouth daily.   tamsulosin 0.4 MG Caps capsule Commonly known as: FLOMAX Take 0.4 mg by mouth daily.        Allergies: No Known Allergies  No family history on file.  Social History:  reports that he quit smoking about 2 years ago. His smoking use included cigarettes. He has never used smokeless tobacco. He reports that he does not drink alcohol and  does not use drugs.  ROS: A complete review of systems was performed.  All systems are negative except for pertinent findings as noted.  Physical Exam:  Vital signs in last 24 hours: There were no vitals taken for this visit. Constitutional:  Alert and oriented, No acute distress Cardiovascular: Regular rate  Respiratory: Normal respiratory effort GI: Abdomen is used.  No inguinal hernias. Genitourinary: Normal male phallus, somewhat buried. testes are descended bilaterally and non-tender and without masses, scrotum is normal in appearance without lesions or masses, perineum is normal on inspection.  Normal anal sphincter tone.  Prostate 30 g, symmetric, nonnodular, nontender. Lymphatic: No lymphadenopathy Neurologic: Grossly intact, no focal deficits Psychiatric: Normal mood and affect  I have reviewed prior pt notes  I have reviewed notes from referring/previous physicians--emergency room notes  I have reviewed urinalysis results--trichomonas seen  I have reviewed prior laboratory  I have reviewed prior urine culture   Impression/Assessment:  Gross hematuria and lifelong smoker.  This needs to be evaluated  Recent urine culture positive for strep, this could have been a contaminant  Trichomonas on urinalysis today  Plan:  1.  I will have his basic metabolic panel checked today  2.  I will send him out for CT hematuria protocol, followed by office visit with cystoscopy  3.  Metronidazole sent in

## 2022-06-16 ENCOUNTER — Ambulatory Visit (INDEPENDENT_AMBULATORY_CARE_PROVIDER_SITE_OTHER): Payer: Medicare HMO | Admitting: Urology

## 2022-06-16 ENCOUNTER — Encounter: Payer: Self-pay | Admitting: Urology

## 2022-06-16 VITALS — BP 147/77 | HR 86 | Ht 71.5 in | Wt 270.0 lb

## 2022-06-16 DIAGNOSIS — R310 Gross hematuria: Secondary | ICD-10-CM

## 2022-06-16 DIAGNOSIS — A599 Trichomoniasis, unspecified: Secondary | ICD-10-CM | POA: Diagnosis not present

## 2022-06-16 MED ORDER — METRONIDAZOLE 500 MG PO TABS: ORAL_TABLET | ORAL | 0 refills | Status: DC

## 2022-06-17 LAB — URINALYSIS, ROUTINE W REFLEX MICROSCOPIC
Bilirubin, UA: NEGATIVE
Glucose, UA: NEGATIVE
Ketones, UA: NEGATIVE
Leukocytes,UA: NEGATIVE
Nitrite, UA: NEGATIVE
Specific Gravity, UA: 1.01 (ref 1.005–1.030)
Urobilinogen, Ur: 1 mg/dL (ref 0.2–1.0)
pH, UA: 6 (ref 5.0–7.5)

## 2022-06-17 LAB — MICROSCOPIC EXAMINATION: Bacteria, UA: NONE SEEN

## 2022-06-17 LAB — BASIC METABOLIC PANEL
BUN/Creatinine Ratio: 19 (ref 10–24)
BUN: 28 mg/dL — ABNORMAL HIGH (ref 8–27)
CO2: 21 mmol/L (ref 20–29)
Calcium: 8.4 mg/dL — ABNORMAL LOW (ref 8.6–10.2)
Chloride: 100 mmol/L (ref 96–106)
Creatinine, Ser: 1.5 mg/dL — ABNORMAL HIGH (ref 0.76–1.27)
Glucose: 121 mg/dL — ABNORMAL HIGH (ref 70–99)
Potassium: 5.2 mmol/L (ref 3.5–5.2)
Sodium: 136 mmol/L (ref 134–144)
eGFR: 50 mL/min/{1.73_m2} — ABNORMAL LOW (ref >59)

## 2022-06-19 ENCOUNTER — Telehealth: Payer: Self-pay

## 2022-06-19 NOTE — Telephone Encounter (Signed)
Patient aware of MD response to results.  Informed patient CT has been ordered and someone from scheduling should be reaching out to get that apt scheduled.  Patient voiced understanding.

## 2022-06-19 NOTE — Telephone Encounter (Signed)
-----   Message from Franchot Gallo, MD sent at 06/19/2022  9:36 AM EDT ----- Let pt know that kidney function slightly decreased. OK to proceed w/ CT though ----- Message ----- From: Audie Box, CMA Sent: 06/17/2022   7:43 AM EDT To: Franchot Gallo, MD  Please review.

## 2022-06-20 ENCOUNTER — Emergency Department (HOSPITAL_COMMUNITY): Payer: PRIVATE HEALTH INSURANCE

## 2022-06-20 ENCOUNTER — Emergency Department (HOSPITAL_COMMUNITY)
Admission: EM | Admit: 2022-06-20 | Discharge: 2022-06-20 | Disposition: A | Discharge status: Home / Self Care | Payer: PRIVATE HEALTH INSURANCE | Attending: Emergency Medicine | Admitting: Emergency Medicine

## 2022-06-20 ENCOUNTER — Other Ambulatory Visit: Payer: Self-pay

## 2022-06-20 DIAGNOSIS — N2889 Other specified disorders of kidney and ureter: Secondary | ICD-10-CM | POA: Diagnosis not present

## 2022-06-20 DIAGNOSIS — R339 Retention of urine, unspecified: Secondary | ICD-10-CM | POA: Diagnosis not present

## 2022-06-20 DIAGNOSIS — R319 Hematuria, unspecified: Secondary | ICD-10-CM | POA: Diagnosis not present

## 2022-06-20 DIAGNOSIS — R310 Gross hematuria: Secondary | ICD-10-CM | POA: Insufficient documentation

## 2022-06-20 DIAGNOSIS — I700 Atherosclerosis of aorta: Secondary | ICD-10-CM | POA: Diagnosis not present

## 2022-06-20 DIAGNOSIS — R109 Unspecified abdominal pain: Secondary | ICD-10-CM | POA: Diagnosis not present

## 2022-06-20 LAB — URINALYSIS, ROUTINE W REFLEX MICROSCOPIC

## 2022-06-20 LAB — BASIC METABOLIC PANEL
Anion gap: 11 (ref 5–15)
BUN: 15 mg/dL (ref 8–23)
CO2: 22 mmol/L (ref 22–32)
Calcium: 8.4 mg/dL — ABNORMAL LOW (ref 8.9–10.3)
Chloride: 104 mmol/L (ref 98–111)
Creatinine, Ser: 1.24 mg/dL (ref 0.61–1.24)
GFR, Estimated: >60 mL/min (ref >60)
Glucose, Bld: 176 mg/dL — ABNORMAL HIGH (ref 70–99)
Potassium: 4 mmol/L (ref 3.5–5.1)
Sodium: 137 mmol/L (ref 135–145)

## 2022-06-20 LAB — CBC WITH DIFFERENTIAL/PLATELET
Abs Immature Granulocytes: 0.21 10*3/uL — ABNORMAL HIGH (ref 0.00–0.07)
Basophils Absolute: 0 10*3/uL (ref 0.0–0.1)
Basophils Relative: 1 %
Eosinophils Absolute: 0.1 10*3/uL (ref 0.0–0.5)
Eosinophils Relative: 2 %
HCT: 32.3 % — ABNORMAL LOW (ref 39.0–52.0)
Hemoglobin: 10.7 g/dL — ABNORMAL LOW (ref 13.0–17.0)
Immature Granulocytes: 4 %
Lymphocytes Relative: 30 %
Lymphs Abs: 1.8 10*3/uL (ref 0.7–4.0)
MCH: 33.2 pg (ref 26.0–34.0)
MCHC: 33.1 g/dL (ref 30.0–36.0)
MCV: 100.3 fL — ABNORMAL HIGH (ref 80.0–100.0)
Monocytes Absolute: 0.9 10*3/uL (ref 0.1–1.0)
Monocytes Relative: 16 %
Neutro Abs: 2.7 10*3/uL (ref 1.7–7.7)
Neutrophils Relative %: 47 %
Platelets: 254 10*3/uL (ref 150–400)
RBC: 3.22 MIL/uL — ABNORMAL LOW (ref 4.22–5.81)
RDW: 20.1 % — ABNORMAL HIGH (ref 11.5–15.5)
WBC: 5.8 10*3/uL (ref 4.0–10.5)
nRBC: 0 % (ref 0.0–0.2)

## 2022-06-20 LAB — URINALYSIS, MICROSCOPIC (REFLEX): RBC / HPF: >50 RBC/hpf (ref 0–5)

## 2022-06-20 MED ORDER — OXYCODONE HCL 5 MG PO TABS: 5.0000 mg | ORAL_TABLET | Freq: Four times a day (QID) | ORAL | 0 refills | Status: AC | PRN

## 2022-06-20 MED ORDER — OXYCODONE HCL 5 MG PO TABS
10.0000 mg | ORAL_TABLET | Freq: Once | ORAL | Status: AC
  Administered 2022-06-20: 10 mg via ORAL
  Filled 2022-06-20: qty 2

## 2022-06-20 MED ORDER — IOHEXOL 300 MG/ML  SOLN
100.0000 mL | Freq: Once | INTRAMUSCULAR | Status: AC | PRN
  Administered 2022-06-20: 100 mL via INTRAVENOUS

## 2022-06-20 NOTE — ED Notes (Signed)
Notified EDP patient c/o of pressure in his bladder when feeling the urge to urinate. Moderate amount of blood tinged urine seeping around foley catheter. Foley irrigated with 200 ml of sterile water. Patient tolerated procedure, no problems noted

## 2022-06-20 NOTE — Discharge Instructions (Signed)
Please call [facility] first thing Monday morning to make an appointment for abnormal CAT scan concerning for mass on kidney.  Please keep your Foley catheter in place until seen by your urologist.  The catheter does have a balloon at the end of it that is inside of your bladder.  Do not pull the Foley catheter out because the balloon will cause damage to the urethra and penis.

## 2022-06-20 NOTE — ED Triage Notes (Signed)
Patient presents to ED via POV, c/o hematuria and dysuria. Reports he was seen at urologist on Wednesday morning for same symptoms. Pt reports urine was clear until today.Reports only able to urinate small amounts and not able to completely empty bladder.

## 2022-06-20 NOTE — ED Provider Notes (Signed)
[facility] EMERGENCY DEPARTMENT Provider Note   CSN: [id number] Arrival date & time: 06/20/22  0805     History  Chief Complaint  Patient presents with   Hematuria    David [name] is a 71 y.o. male.  Patient is a 71 year old male presenting for hematuria, decreased urine production, and lower abdominal pain.  Was seen in ED on 9/25 for gross hematuria.  He had follow-up appointment with urology who treated him with metronidazole for possible urinary tract infection and ordered a CT with and without contrast to rule out any other pathologies for the first week of November.  Patient states the hematuria originally decreased after antibiotics however states this morning he woke up with his distended abdomen, difficulty urinating, and gross blood at the tip of the penis.  The history is provided by the patient. No language interpreter was used.  Hematuria Associated symptoms include abdominal pain. Pertinent negatives include no chest pain and no shortness of breath.       Home Medications Prior to Admission medications   Medication Sig Start Date End Date Taking? Authorizing Provider  AREXVY 120 MCG/0.5ML injection  06/01/22   [provider]  calcium carbonate (OS-CAL) 600 MG TABS Take 600 mg by mouth daily. Calcium '600mg'$  with Vitamin D    [provider]  metroNIDAZOLE (FLAGYL) 500 MG tablet Take all 4 tablets at once 06/16/22   Franchot Gallo, MD  multivitamin-iron-minerals-folic acid (THERAPEUTIC-M) TABS tablet Take 1 tablet by mouth daily.    [provider]  tamsulosin (FLOMAX) 0.4 MG CAPS capsule Take 0.4 mg by mouth daily.     [provider]      Allergies    Patient has no allergy information on record.    Review of Systems   Review of Systems  Constitutional:  Negative for chills and fever.  HENT:  Negative for ear pain and sore throat.   Eyes:  Negative for pain and visual disturbance.  Respiratory:  Negative for cough  and shortness of breath.   Cardiovascular:  Negative for chest pain and palpitations.  Gastrointestinal:  Positive for abdominal pain. Negative for vomiting.  Genitourinary:  Positive for decreased urine volume and hematuria. Negative for dysuria.  Musculoskeletal:  Negative for arthralgias and back pain.  Skin:  Negative for color change and rash.  Neurological:  Negative for seizures and syncope.  All other systems reviewed and are negative.   Physical Exam Updated Vital Signs BP (!) 189/103 (BP Location: Right Arm)   Pulse 100   Temp (!) 97.5 F (36.4 C) (Oral)   Resp 20   Ht '5\' 11"'$  (1.803 m)   Wt 122.5 kg   SpO2 100%   BMI 37.66 kg/m  Physical Exam Vitals and nursing note reviewed.  Constitutional:      General: He is not in acute distress.    Appearance: He is well-developed.  HENT:     Head: Normocephalic and atraumatic.  Eyes:     Conjunctiva/sclera: Conjunctivae normal.  Cardiovascular:     Rate and Rhythm: Normal rate and regular rhythm.     Heart sounds: No murmur heard. Pulmonary:     Effort: Pulmonary effort is normal. No respiratory distress.     Breath sounds: Normal breath sounds.  Abdominal:     General: There is distension.     Palpations: Abdomen is soft.     Tenderness: There is generalized abdominal tenderness. There is guarding.  Musculoskeletal:  General: No swelling.     Cervical back: Neck supple.  Skin:    General: Skin is warm and dry.     Capillary Refill: Capillary refill takes less than 2 seconds.  Neurological:     Mental Status: He is alert.  Psychiatric:        Mood and Affect: Mood normal.     ED Results / Procedures / Treatments   Labs (all labs ordered are listed, but only abnormal results are displayed) Labs Reviewed  CBC WITH DIFFERENTIAL/PLATELET - Abnormal; Notable for the following components:      Result Value   RBC 3.22 (*)    Hemoglobin 10.7 (*)    HCT 32.3 (*)    MCV 100.3 (*)    RDW 20.1 (*)    Abs  Immature Granulocytes 0.21 (*)    All other components within normal limits  BASIC METABOLIC PANEL - Abnormal; Notable for the following components:   Glucose, Bld 176 (*)    Calcium 8.4 (*)    All other components within normal limits  URINALYSIS, ROUTINE W REFLEX MICROSCOPIC - Abnormal; Notable for the following components:   Color, Urine RED (*)    APPearance TURBID (*)    Glucose, UA   (*)    Value: TEST NOT REPORTED DUE TO COLOR INTERFERENCE OF URINE PIGMENT   Hgb urine dipstick   (*)    Value: TEST NOT REPORTED DUE TO COLOR INTERFERENCE OF URINE PIGMENT   Bilirubin Urine   (*)    Value: TEST NOT REPORTED DUE TO COLOR INTERFERENCE OF URINE PIGMENT   Ketones, ur   (*)    Value: TEST NOT REPORTED DUE TO COLOR INTERFERENCE OF URINE PIGMENT   Protein, ur   (*)    Value: TEST NOT REPORTED DUE TO COLOR INTERFERENCE OF URINE PIGMENT   Nitrite   (*)    Value: TEST NOT REPORTED DUE TO COLOR INTERFERENCE OF URINE PIGMENT   Leukocytes,Ua   (*)    Value: TEST NOT REPORTED DUE TO COLOR INTERFERENCE OF URINE PIGMENT   All other components within normal limits  URINALYSIS, MICROSCOPIC (REFLEX) - Abnormal; Notable for the following components:   Bacteria, UA MANY (*)    All other components within normal limits  URINE CULTURE    EKG None  Radiology CT ABDOMEN PELVIS W WO CONTRAST  Result Date: 06/20/2022 CLINICAL DATA:  Gross hematuria.  Dysuria.  Flank pain. EXAM: CT ABDOMEN AND PELVIS WITHOUT AND WITH CONTRAST TECHNIQUE: Multidetector CT imaging of the abdomen and pelvis was performed following the standard protocol before and following the bolus administration of intravenous contrast. RADIATION DOSE REDUCTION: This exam was performed according to the departmental dose-optimization program which includes automated exposure control, adjustment of the mA and/or kV according to patient size and/or use of iterative reconstruction technique. CONTRAST:  178m OMNIPAQUE IOHEXOL 300 MG/ML  SOLN  COMPARISON:  None Available. FINDINGS: Lower Chest: No acute findings. Hepatobiliary: No hepatic masses identified. Gallbladder is unremarkable. No evidence of biliary ductal dilatation. Pancreas:  No mass or inflammatory changes. Spleen: Within normal limits in size and appearance. Adrenals/Urinary Tract: No adrenal masses identified. No evidence of urolithiasis. A solid enhancing mass is seen which is centered in the anterior midpole of the left kidney measuring 8.1 x 6.1 cm. Mildly dilated left renal collecting system is seen containing high attenuation material consistent with blood clot. Blood clot is also seen within the urinary bladder, along with a Foley catheter. No evidence of  right renal mass. Stomach/Bowel: No evidence of obstruction, inflammatory process or abnormal fluid collections. Vascular/Lymphatic: No pathologically enlarged lymph nodes. No acute vascular findings. Aortic atherosclerotic calcification incidentally noted. No evidence of renal vein or IVC thrombus. Reproductive:  No mass or other significant abnormality. Other:  None. Musculoskeletal:  No suspicious bone lesions identified. IMPRESSION: 8 cm solid mass in anterior midpole of left kidney, highly suspicious for renal cell carcinoma. High attenuation blood clot within left renal collecting system and urinary bladder. No evidence of metastatic disease. Aortic Atherosclerosis (ICD10-I70.0). Electronically Signed   By: Marlaine Hind M.D.   On: 06/20/2022 11:04    Procedures Procedures    Medications Ordered in ED Medications  iohexol (OMNIPAQUE) 300 MG/ML solution 100 mL (100 mLs Intravenous Contrast Given 06/20/22 1012)    ED Course/ Medical Decision Making/ A&P                           Medical Decision Making Amount and/or Complexity of Data Reviewed Labs: ordered. Radiology: ordered.  Risk Prescription drug management.   39:32 AM 71 year old male presenting for hematuria, decreased urine production, and lower  abdominal pain.  Patient is alert and oriented x3, no acute distress, afebrile, stable vital signs.  Physical exam demonstrates a distended abdomen with guarding and distention.  Generalized tenderness on exam.  Bedside ladder scan demonstrates greater than 800 mL of fluid.  Foley catheter ordered.  CT abdomen pelvis with and without contrast ordered.  We will cancel November study ordered by urology.  CT ab/pelvis demonstrates: 8 cm solid mass in anterior midpole of left kidney, highly suspicious for renal cell carcinoma. High attenuation blood clot within left renal collecting system and urinary bladder. No evidence of metastatic disease.   Otherwise stable renal function at this time.  Foley catheter in place.  Urine culture sent.  I called [facility] to update them on patient's new CAT scan findings and recommend close follow-up.  They recommend patient call the office to make an appointment.  Scan described to patient in detail including concern for renal cell carcinoma.  Patient in no distress and overall condition improved here in the ED. Detailed discussions were had with the patient regarding current findings, and need for close f/u with PCP or on call doctor. The patient has been instructed to return immediately if the symptoms worsen in any way for re-evaluation. Patient verbalized understanding and is in agreement with current care plan. All questions answered prior to discharge.          Final Clinical Impression(s) / ED Diagnoses Final diagnoses:  Renal mass  Gross hematuria  Urinary retention    Rx / DC Orders ED Discharge Orders     None         Lianne Cure, DO 15/05[birth year]69 1127

## 2022-06-22 ENCOUNTER — Ambulatory Visit (INDEPENDENT_AMBULATORY_CARE_PROVIDER_SITE_OTHER): Payer: Medicare HMO | Admitting: Physician Assistant

## 2022-06-22 DIAGNOSIS — R000 Tachycardia, unspecified: Secondary | ICD-10-CM | POA: Diagnosis not present

## 2022-06-22 DIAGNOSIS — R339 Retention of urine, unspecified: Secondary | ICD-10-CM

## 2022-06-22 DIAGNOSIS — I10 Essential (primary) hypertension: Secondary | ICD-10-CM | POA: Diagnosis not present

## 2022-06-22 LAB — URINE CULTURE: Culture: NO GROWTH

## 2022-06-22 NOTE — Progress Notes (Signed)
Patient present in office today complaining of clogged catheter.  Unable to flush cath. Per PA ok to increase cath size to 39F  Cath Change/ Replacement  Patient is present today for a catheter change due to urinary retention.  42m of water was removed from the balloon, a 16 coude FR foley cath was removed without difficulty.  Patient was cleaned and prepped in a sterile fashion with betadine.  A 243foude FR foley cath was replaced into the bladder, no complications were noted. Bladder was irrigated. Urine return was noted 90055mnd urine was red in color. The balloon was filled with 42m71m sterile water. A bedside bag was attached for drainage.  Patient was given proper instruction on catheter care.    Performed by: Sonali Wivell LPN   Follow up: Keep scheduled OV      Procedure reviewed. Agree with clinical documentation.  Julienne A Summerlin PA-C

## 2022-06-23 ENCOUNTER — Other Ambulatory Visit: Payer: Self-pay

## 2022-06-23 ENCOUNTER — Encounter (HOSPITAL_COMMUNITY): Payer: Self-pay | Admitting: Emergency Medicine

## 2022-06-23 ENCOUNTER — Emergency Department (HOSPITAL_COMMUNITY)
Admission: EM | Admit: 2022-06-23 | Discharge: 2022-06-23 | Disposition: A | Discharge status: Home / Self Care | Payer: Medicare HMO | Attending: Emergency Medicine | Admitting: Emergency Medicine

## 2022-06-23 DIAGNOSIS — I959 Hypotension, unspecified: Secondary | ICD-10-CM | POA: Diagnosis not present

## 2022-06-23 DIAGNOSIS — Y732 Prosthetic and other implants, materials and accessory gastroenterology and urology devices associated with adverse incidents: Secondary | ICD-10-CM | POA: Insufficient documentation

## 2022-06-23 DIAGNOSIS — T83091A Other mechanical complication of indwelling urethral catheter, initial encounter: Secondary | ICD-10-CM | POA: Diagnosis not present

## 2022-06-23 DIAGNOSIS — R310 Gross hematuria: Secondary | ICD-10-CM | POA: Diagnosis not present

## 2022-06-23 DIAGNOSIS — Z79899 Other long term (current) drug therapy: Secondary | ICD-10-CM | POA: Insufficient documentation

## 2022-06-23 DIAGNOSIS — T83098A Other mechanical complication of other indwelling urethral catheter, initial encounter: Secondary | ICD-10-CM | POA: Diagnosis not present

## 2022-06-23 DIAGNOSIS — N3090 Cystitis, unspecified without hematuria: Secondary | ICD-10-CM | POA: Diagnosis not present

## 2022-06-23 DIAGNOSIS — R339 Retention of urine, unspecified: Secondary | ICD-10-CM | POA: Diagnosis not present

## 2022-06-23 DIAGNOSIS — N3091 Cystitis, unspecified with hematuria: Secondary | ICD-10-CM | POA: Diagnosis not present

## 2022-06-23 DIAGNOSIS — N2889 Other specified disorders of kidney and ureter: Secondary | ICD-10-CM | POA: Diagnosis not present

## 2022-06-23 NOTE — Discharge Instructions (Signed)
Please closely monitor the output from the Foley.  If you note that the drainage is slowing down or has stopped, please attempt to flush and clear the Foley catheter as you were taught today.  If this is not improved the situation after a couple of attempts, please return to the emergency department for further evaluation.  Please follow-up with your urologist as planned.

## 2022-06-23 NOTE — ED Notes (Signed)
Attempted to empty foley bag and unable to drain; urine bag changed and pt given paper scrub pants to be discharged

## 2022-06-23 NOTE — ED Notes (Signed)
CSW updated that family is requesting to speak with CSW. CSW spoke with pts sister who is requesting information about transportation due to pt needing to come to the hospital for foley flush. CSW explained that they can reach out to pts insurance company to see if he has any appointment transportation benefits. CSW inquired about interest in [facility] RN due to frequent hospital visits. Family is interested and requesting that this be set up. CSW spoke to [place] with [place] who accepts referral for [facility] RN. CSW requested that MD place [facility] RN only orders. RN updated. TOC signing off.

## 2022-06-23 NOTE — ED Notes (Addendum)
Pts foley flushed. Blood clots noted draining from foley. Pt had immediate flow of urine into drainage bag. Pt had a total of 1500cc.

## 2022-06-23 NOTE — ED Provider Notes (Addendum)
[facility] EMERGENCY DEPARTMENT Provider Note   CSN: [id number] Arrival date & time: 06/23/22  1249     History  Chief Complaint  Patient presents with   Foley Problem    David [name] is a 71 y.o. male.  Patient presents for evaluation of urinary retention.  He has had multiple visits over the past couple of days for similar symptoms.  Seen initially in the emergency department on 9/25 for painless hematuria.  He was given a course of antibiotics for this and asked to follow-up with urology which he did.  After that appointment, he developed urinary retention thought due to blood clots in the urine.  A Foley was placed on 06/20/2022.  At that time he had a CT performed showing a right 8 cm renal mass concerning for renal cell carcinoma.  He is due to follow back up with urology in regards to this.  He has been having difficulty with blocked Foley.  Was seen most recently, earlier this morning, at [facility] ED.  At that time lab work was reassuring.  Was given a prescription for Keflex.  Again developed urinary retention late this morning, prompting emergency department visit.      Home Medications Prior to Admission medications   Medication Sig Start Date End Date Taking? Authorizing Provider  AREXVY 120 MCG/0.5ML injection  06/01/22   [provider]  calcium carbonate (OS-CAL) 600 MG TABS Take 600 mg by mouth daily. Calcium '600mg'$  with Vitamin D    [provider]  cephALEXin (KEFLEX) 500 MG capsule Take 500 mg by mouth 4 (four) times daily. 06/22/22   [provider]  metroNIDAZOLE (FLAGYL) 500 MG tablet Take all 4 tablets at once 06/16/22   Franchot Gallo, MD  multivitamin-iron-minerals-folic acid (THERAPEUTIC-M) TABS tablet Take 1 tablet by mouth daily.    [provider]  oxyCODONE (ROXICODONE) 5 MG immediate release tablet Take 1 tablet (5 mg total) by mouth every 6 (six) hours as needed for up to 3 days for severe pain. 06/20/22 96/28/36   Campbell Stall P, DO  tamsulosin (FLOMAX) 0.4 MG CAPS capsule Take 0.4 mg by mouth daily.     [provider]      Allergies    Patient has no allergy information on record.    Review of Systems   Review of Systems  Physical Exam Updated Vital Signs BP (!) 174/89 (BP Location: Right Arm)   Pulse 98   Temp 97.9 F (36.6 C) (Oral)   Resp 20   SpO2 97%   Physical Exam Vitals and nursing note reviewed.  Constitutional:      Appearance: He is well-developed.  HENT:     Head: Normocephalic and atraumatic.  Eyes:     Conjunctiva/sclera: Conjunctivae normal.  Pulmonary:     Effort: No respiratory distress.  Abdominal:     Tenderness: There is no abdominal tenderness. There is no guarding or rebound.  Genitourinary:    Comments: Nearly 1500 cc of reddish urine noted in Foley bag. Musculoskeletal:     Cervical back: Normal range of motion and neck supple.  Skin:    General: Skin is warm and dry.  Neurological:     Mental Status: He is alert.    ED Results / Procedures / Treatments   Labs (all labs ordered are listed, but only abnormal results are displayed) Labs Reviewed - No data to display  EKG None  Radiology No results found.  Procedures Procedures    Medications  Ordered in ED Medications - No data to display  ED Course/ Medical Decision Making/ A&P    Patient seen and examined. History obtained directly from patient.  Reviewed previous emergency department visits including here and externally at [facility] this morning.  Reviewed lab work performed at that time.  Labs/EKG: None ordered  Imaging: None ordered  Medications/Fluids: None ordered  Most recent vital signs reviewed and are as follows: BP (!) 174/89 (BP Location: Right Arm)   Pulse 98   Temp 97.9 F (36.6 C) (Oral)   Resp 20   SpO2 97%   Initial impression: Urinary retention due to clogged Foley catheter from ongoing urinary bleeding, likely due to recently diagnosed renal  mass.  The bleeding is likely to continue.  Patient will be shown how to attempt to flush Foley catheter in case he has further problems with retention.  Patient is willing to try this.  RN will teach.  Patient is aware that if he develops persistent blockage of his Foley and symptoms, he will need to return to the emergency department for additional treatment.  Otherwise, will take Keflex prescribed at previous visit this morning.  He will follow-up with urology for evaluation of known renal mass.  2:57 PM Reassessment performed. Patient appears comfortable.   Plan: Discharge to home.   Prescriptions written for: None  Other home care instructions discussed: Monitor Foley output and flush as necessary  ED return instructions discussed: Return with recurrent urinary retention and blocked Foley, new symptoms or other concerns  Follow-up instructions discussed: Patient encouraged to follow-up with their urologist as planned.  3:12 PM prior to discharge, Foley became obstructed again.  Will need to replace.  Foley switched out to 22. SW has seen, per RN, and discussed home health. Ready for d/c.                           Medical Decision Making  Patient with recently diagnosed right renal mass likely causing hematuria.  This is likely contributing to clot burden which has caused several urinary retention episodes over the past couple of weeks.  This was cleared in the ED today.  Treatment plan as above.  Patient does not appear septic and low concern for UTI today.        Final Clinical Impression(s) / ED Diagnoses Final diagnoses:  Obstruction of Foley catheter, initial encounter [facility])  Gross hematuria    Rx / DC Orders ED Discharge Orders     None         Carlisle Cater, PA-C 06/23/22 1501    Carlisle Cater, PA-C 06/23/22 1727    Audley Hose, MD 06/23/22 2103

## 2022-06-23 NOTE — ED Triage Notes (Signed)
Pt reports having foley placed yesterday while at [facility]. Pt reports it has not drained "in a couple hours." Pt has 50cc urine in bag.

## 2022-06-25 ENCOUNTER — Telehealth: Payer: Self-pay

## 2022-06-25 NOTE — Telephone Encounter (Addendum)
I returned patient's call and informed him he would have to contact the prescribing provider with his questions on if he needs to take the rx or not.  Patient voiced understanding.  Confirmed with patient is upcoming 10/17 with Dr. Diona Fanti.

## 2022-06-25 NOTE — Telephone Encounter (Signed)
Patient called and stated that he went to the ED on Sunday and his pharmacy called to informed he had a prescription for cephalexin. Patient wants to know if he should take the medication especially that he has an upcoming appt with our office 10/17 for cysto. Please advised patient

## 2022-06-25 NOTE — Telephone Encounter (Signed)
Patient called in and voiced that he was prescribe cephalexin from the ED. Made patient aware that he should take the medication as prescribe. Made patient aware that I will forward his information to the providers nurse to ensure that I gave him correct information. Patient voiced understanding.

## 2022-06-26 DIAGNOSIS — R310 Gross hematuria: Secondary | ICD-10-CM | POA: Diagnosis not present

## 2022-06-26 DIAGNOSIS — Z466 Encounter for fitting and adjustment of urinary device: Secondary | ICD-10-CM | POA: Diagnosis not present

## 2022-06-26 DIAGNOSIS — G40909 Epilepsy, unspecified, not intractable, without status epilepticus: Secondary | ICD-10-CM | POA: Diagnosis not present

## 2022-06-26 DIAGNOSIS — D709 Neutropenia, unspecified: Secondary | ICD-10-CM | POA: Diagnosis not present

## 2022-06-26 DIAGNOSIS — Z9181 History of falling: Secondary | ICD-10-CM | POA: Diagnosis not present

## 2022-06-26 DIAGNOSIS — R339 Retention of urine, unspecified: Secondary | ICD-10-CM | POA: Diagnosis not present

## 2022-06-26 DIAGNOSIS — I10 Essential (primary) hypertension: Secondary | ICD-10-CM | POA: Diagnosis not present

## 2022-06-26 NOTE — ED Notes (Signed)
10/13: CSW received call from ED that pts sister was requesting to speak. Sister states that pts HH RN is supposed to come for appointment today but they have not heard anything. CSW provided pts sister with Verdie Shire [facility] rep info. CSW updated Marjory Lies that phone call would be coming.

## 2022-06-29 NOTE — Progress Notes (Signed)
History of Present Illness:   10.23.2023: 71 year old male presents for follow-up of gross hematuria.  The patient presented to the emergency room approximately 9 days ago with a 2-day history of gross hematuria, painless.  He was passing clots.  In the emergency room he was noted to have gross hematuria, felt to be possible urinary tract infection.  He was sent home on Keflex.  Urine culture revealed slight growth of strep.  Over the past 3 days he has not had any further gross hematuria or lower urinary tract symptoms.   He does have a remote history of gross hematuria with a UTI in the past.  He is a smoker.   He has had no abdominal or flank complaints.  No prior history of kidney stones.  10.16.2023: Here for cysto. 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 High attenuation blood clot within left renal collecting system and urinary bladder. No evidence of metastatic disease. Aortic Atherosclerosis (ICD10-I70.0).    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 Freddie Apley note   Pneumonia    Seizure [facility])     Past Surgical History:  Procedure Laterality Date   COLONOSCOPY  05/31/2012   Procedure: COLONOSCOPY;  Surgeon: Jamesetta So, MD;  Location: [facility];  Service: Gastroenterology;  Laterality: N/A;   TONSILLECTOMY      Home Medications:  Allergies as of 06/30/2022   Not on File      Medication List        Accurate as of June 29, 2022  7:46 PM. If you have any questions, ask your nurse or doctor.          Arexvy 120 MCG/0.5ML injection Generic drug: RSV vaccine recomb adjuvanted   calcium carbonate 600 MG Tabs tablet Commonly known as: OS-CAL Take 600 mg by mouth daily. Calcium '600mg'$  with Vitamin D   cephALEXin 500 MG capsule Commonly known as: KEFLEX Take 500 mg by mouth 4  (four) times daily.   multivitamin-iron-minerals-folic acid Tabs tablet Take 1 tablet by mouth daily.   tamsulosin 0.4 MG Caps capsule Commonly known as: FLOMAX Take 0.4 mg by mouth daily.        Allergies: Not on File  No family history on file.  Social History:  reports that he quit smoking about 2 years ago. His smoking use included cigarettes. He has never used smokeless tobacco. He reports that he does not drink alcohol and does not use drugs.  ROS: A complete review of systems was performed.  All systems are negative except for pertinent findings as noted.  Physical Exam:  Vital signs in last 24 hours: There were no vitals taken for this visit. Constitutional:  Alert and oriented, No acute distress Cardiovascular: Regular rate  Respiratory: Normal respiratory effort Neurologic: Grossly intact, no focal deficits Psychiatric: Normal mood and affect  I have reviewed prior pt notes  I have reviewed notes from referring/previous physicians  I have independently reviewed prior imaging-  I have reviewed prior PSA results  Cystoscopy Procedure Note:  Indication: ***  After informed consent and discussion of the procedure and its risks, JAYMESON MENGEL was positioned and prepped in the standard fashion.  Cystoscopy was performed with a flexible cystoscope.   Findings: Urethra:*** Prostate:*** Bladder neck:*** Ureteral orifices:*** Bladder:***  The patient tolerated  the procedure well.      Impression/Assessment:  ***  Plan:  ***

## 2022-06-30 ENCOUNTER — Ambulatory Visit (INDEPENDENT_AMBULATORY_CARE_PROVIDER_SITE_OTHER): Payer: Medicare HMO | Admitting: Urology

## 2022-06-30 VITALS — BP 127/70 | HR 89

## 2022-06-30 DIAGNOSIS — R310 Gross hematuria: Secondary | ICD-10-CM | POA: Diagnosis not present

## 2022-06-30 DIAGNOSIS — R339 Retention of urine, unspecified: Secondary | ICD-10-CM | POA: Diagnosis not present

## 2022-06-30 DIAGNOSIS — N2889 Other specified disorders of kidney and ureter: Secondary | ICD-10-CM | POA: Diagnosis not present

## 2022-06-30 NOTE — Progress Notes (Signed)
Catheter Removal  Patient is present today for a catheter removal.  37m of water was drained from the balloon. A 22FR foley cath was removed from the bladder, no complications were noted. Patient tolerated well.  Performed by: Keylin Ferryman LPN  Follow up/ Additional notes: Per MD note  MD replaced foley cath after cysto

## 2022-07-02 ENCOUNTER — Telehealth: Payer: Self-pay

## 2022-07-02 NOTE — Telephone Encounter (Signed)
Received a fax from [facility] nurse requesting orders to irrigate pt's catheter.  Returned call to give verbal orders per MD's last note, after speaking to the nurse she states she had called on call MD to get the order.  Dr. Alyson Ingles gave verbal orders for [facility] to change and irrigate cath.

## 2022-07-07 ENCOUNTER — Other Ambulatory Visit: Payer: Medicare HMO | Admitting: Urology

## 2022-07-10 DIAGNOSIS — R310 Gross hematuria: Secondary | ICD-10-CM | POA: Diagnosis not present

## 2022-07-10 DIAGNOSIS — I10 Essential (primary) hypertension: Secondary | ICD-10-CM | POA: Diagnosis not present

## 2022-07-10 DIAGNOSIS — R339 Retention of urine, unspecified: Secondary | ICD-10-CM | POA: Diagnosis not present

## 2022-07-10 DIAGNOSIS — Z466 Encounter for fitting and adjustment of urinary device: Secondary | ICD-10-CM | POA: Diagnosis not present

## 2022-07-11 ENCOUNTER — Ambulatory Visit (HOSPITAL_BASED_OUTPATIENT_CLINIC_OR_DEPARTMENT_OTHER): Payer: Medicare HMO

## 2022-07-16 ENCOUNTER — Ambulatory Visit (HOSPITAL_BASED_OUTPATIENT_CLINIC_OR_DEPARTMENT_OTHER)
Admission: RE | Admit: 2022-07-16 | Discharge: 2022-07-16 | Disposition: A | Discharge status: Home / Self Care | Payer: Medicare HMO | Source: Ambulatory Visit | Attending: Urology | Admitting: Urology

## 2022-07-16 ENCOUNTER — Encounter (HOSPITAL_BASED_OUTPATIENT_CLINIC_OR_DEPARTMENT_OTHER): Payer: Self-pay

## 2022-07-16 DIAGNOSIS — J439 Emphysema, unspecified: Secondary | ICD-10-CM | POA: Insufficient documentation

## 2022-07-16 DIAGNOSIS — N2889 Other specified disorders of kidney and ureter: Secondary | ICD-10-CM | POA: Diagnosis not present

## 2022-07-16 DIAGNOSIS — I2510 Atherosclerotic heart disease of native coronary artery without angina pectoris: Secondary | ICD-10-CM | POA: Insufficient documentation

## 2022-07-16 DIAGNOSIS — R310 Gross hematuria: Secondary | ICD-10-CM | POA: Diagnosis not present

## 2022-07-16 DIAGNOSIS — R911 Solitary pulmonary nodule: Secondary | ICD-10-CM | POA: Diagnosis not present

## 2022-07-16 DIAGNOSIS — I700 Atherosclerosis of aorta: Secondary | ICD-10-CM | POA: Diagnosis not present

## 2022-07-16 MED ORDER — IOHEXOL 300 MG/ML  SOLN: 100.0000 mL | Freq: Once | INTRAMUSCULAR | Status: DC | PRN

## 2022-07-23 ENCOUNTER — Observation Stay (HOSPITAL_COMMUNITY)
Admission: EM | Admit: 2022-07-23 | Discharge: 2022-07-24 | Disposition: A | Discharge status: Home Health | Payer: Medicare HMO | Attending: Family Medicine | Admitting: Family Medicine

## 2022-07-23 ENCOUNTER — Encounter (HOSPITAL_COMMUNITY): Payer: Self-pay | Admitting: Emergency Medicine

## 2022-07-23 ENCOUNTER — Telehealth: Payer: Self-pay

## 2022-07-23 ENCOUNTER — Ambulatory Visit: Payer: PRIVATE HEALTH INSURANCE

## 2022-07-23 ENCOUNTER — Other Ambulatory Visit: Payer: Self-pay

## 2022-07-23 DIAGNOSIS — R2681 Unsteadiness on feet: Secondary | ICD-10-CM | POA: Insufficient documentation

## 2022-07-23 DIAGNOSIS — N3001 Acute cystitis with hematuria: Secondary | ICD-10-CM | POA: Diagnosis not present

## 2022-07-23 DIAGNOSIS — R7401 Elevation of levels of liver transaminase levels: Secondary | ICD-10-CM | POA: Insufficient documentation

## 2022-07-23 DIAGNOSIS — R339 Retention of urine, unspecified: Secondary | ICD-10-CM

## 2022-07-23 DIAGNOSIS — R2689 Other abnormalities of gait and mobility: Secondary | ICD-10-CM | POA: Insufficient documentation

## 2022-07-23 DIAGNOSIS — E876 Hypokalemia: Secondary | ICD-10-CM | POA: Diagnosis not present

## 2022-07-23 DIAGNOSIS — R911 Solitary pulmonary nodule: Secondary | ICD-10-CM | POA: Insufficient documentation

## 2022-07-23 DIAGNOSIS — N2889 Other specified disorders of kidney and ureter: Secondary | ICD-10-CM | POA: Diagnosis not present

## 2022-07-23 DIAGNOSIS — N401 Enlarged prostate with lower urinary tract symptoms: Secondary | ICD-10-CM | POA: Diagnosis not present

## 2022-07-23 DIAGNOSIS — Z978 Presence of other specified devices: Secondary | ICD-10-CM

## 2022-07-23 DIAGNOSIS — N179 Acute kidney failure, unspecified: Secondary | ICD-10-CM | POA: Diagnosis not present

## 2022-07-23 DIAGNOSIS — Z87891 Personal history of nicotine dependence: Secondary | ICD-10-CM | POA: Diagnosis not present

## 2022-07-23 DIAGNOSIS — T83098A Other mechanical complication of other indwelling urethral catheter, initial encounter: Secondary | ICD-10-CM | POA: Diagnosis present

## 2022-07-23 DIAGNOSIS — N400 Enlarged prostate without lower urinary tract symptoms: Secondary | ICD-10-CM | POA: Insufficient documentation

## 2022-07-23 DIAGNOSIS — Z6837 Body mass index (BMI) 37.0-37.9, adult: Secondary | ICD-10-CM | POA: Diagnosis not present

## 2022-07-23 DIAGNOSIS — C649 Malignant neoplasm of unspecified kidney, except renal pelvis: Secondary | ICD-10-CM

## 2022-07-23 DIAGNOSIS — E669 Obesity, unspecified: Secondary | ICD-10-CM | POA: Diagnosis not present

## 2022-07-23 DIAGNOSIS — N390 Urinary tract infection, site not specified: Secondary | ICD-10-CM | POA: Diagnosis not present

## 2022-07-23 LAB — CBC WITH DIFFERENTIAL/PLATELET
Abs Immature Granulocytes: 0.07 10*3/uL (ref 0.00–0.07)
Basophils Absolute: 0 10*3/uL (ref 0.0–0.1)
Basophils Relative: 1 %
Eosinophils Absolute: 0.1 10*3/uL (ref 0.0–0.5)
Eosinophils Relative: 1 %
HCT: 31.2 % — ABNORMAL LOW (ref 39.0–52.0)
Hemoglobin: 10.2 g/dL — ABNORMAL LOW (ref 13.0–17.0)
Immature Granulocytes: 1 %
Lymphocytes Relative: 28 %
Lymphs Abs: 1.8 10*3/uL (ref 0.7–4.0)
MCH: 31.9 pg (ref 26.0–34.0)
MCHC: 32.7 g/dL (ref 30.0–36.0)
MCV: 97.5 fL (ref 80.0–100.0)
Monocytes Absolute: 1.1 10*3/uL — ABNORMAL HIGH (ref 0.1–1.0)
Monocytes Relative: 18 %
Neutro Abs: 3.3 10*3/uL (ref 1.7–7.7)
Neutrophils Relative %: 51 %
Platelets: 175 10*3/uL (ref 150–400)
RBC: 3.2 MIL/uL — ABNORMAL LOW (ref 4.22–5.81)
RDW: 19.6 % — ABNORMAL HIGH (ref 11.5–15.5)
WBC: 6.4 10*3/uL (ref 4.0–10.5)
nRBC: 0 % (ref 0.0–0.2)

## 2022-07-23 LAB — COMPREHENSIVE METABOLIC PANEL
ALT: 64 U/L — ABNORMAL HIGH (ref 0–44)
AST: 91 U/L — ABNORMAL HIGH (ref 15–41)
Albumin: 3.3 g/dL — ABNORMAL LOW (ref 3.5–5.0)
Alkaline Phosphatase: 73 U/L (ref 38–126)
Anion gap: 12 (ref 5–15)
BUN: 41 mg/dL — ABNORMAL HIGH (ref 8–23)
CO2: 20 mmol/L — ABNORMAL LOW (ref 22–32)
Calcium: 8.5 mg/dL — ABNORMAL LOW (ref 8.9–10.3)
Chloride: 101 mmol/L (ref 98–111)
Creatinine, Ser: 2.02 mg/dL — ABNORMAL HIGH (ref 0.61–1.24)
GFR, Estimated: 35 mL/min — ABNORMAL LOW (ref >60)
Glucose, Bld: 148 mg/dL — ABNORMAL HIGH (ref 70–99)
Potassium: 3.3 mmol/L — ABNORMAL LOW (ref 3.5–5.1)
Sodium: 133 mmol/L — ABNORMAL LOW (ref 135–145)
Total Bilirubin: 1.2 mg/dL (ref 0.3–1.2)
Total Protein: 7.6 g/dL (ref 6.5–8.1)

## 2022-07-23 LAB — URINALYSIS, ROUTINE W REFLEX MICROSCOPIC
Bilirubin Urine: NEGATIVE
Glucose, UA: NEGATIVE mg/dL
Ketones, ur: NEGATIVE mg/dL
Nitrite: NEGATIVE
Protein, ur: 100 mg/dL — AB
Specific Gravity, Urine: 1.015 (ref 1.005–1.030)
Trans Epithel, UA: 1
WBC, UA: >50 WBC/hpf — ABNORMAL HIGH (ref 0–5)
pH: 5 (ref 5.0–8.0)

## 2022-07-23 MED ORDER — SODIUM CHLORIDE 0.9 % IV SOLN: 1.0000 g | INTRAVENOUS | Status: DC

## 2022-07-23 MED ORDER — ACETAMINOPHEN 325 MG PO TABS: 650.0000 mg | ORAL_TABLET | Freq: Four times a day (QID) | ORAL | Status: DC | PRN

## 2022-07-23 MED ORDER — POTASSIUM CHLORIDE CRYS ER 20 MEQ PO TBCR
40.0000 meq | EXTENDED_RELEASE_TABLET | Freq: Four times a day (QID) | ORAL | Status: AC
  Administered 2022-07-23 – 2022-07-24 (×2): 40 meq via ORAL
  Filled 2022-07-23 (×2): qty 2

## 2022-07-23 MED ORDER — SODIUM CHLORIDE 0.9 % IV BOLUS
1000.0000 mL | Freq: Once | INTRAVENOUS | Status: AC
  Administered 2022-07-23: 1000 mL via INTRAVENOUS

## 2022-07-23 MED ORDER — HEPARIN SODIUM (PORCINE) 5000 UNIT/ML IJ SOLN: 5000.0000 [IU] | Freq: Three times a day (TID) | INTRAMUSCULAR | Status: DC

## 2022-07-23 MED ORDER — TAMSULOSIN HCL 0.4 MG PO CAPS
0.4000 mg | ORAL_CAPSULE | Freq: Every day | ORAL | Status: DC
  Administered 2022-07-24: 0.4 mg via ORAL
  Filled 2022-07-23: qty 1

## 2022-07-23 MED ORDER — SODIUM CHLORIDE 0.9 % IV SOLN
2.0000 g | Freq: Once | INTRAVENOUS | Status: DC
  Filled 2022-07-23: qty 20

## 2022-07-23 MED ORDER — LACTATED RINGERS IV SOLN: INTRAVENOUS | Status: AC

## 2022-07-23 MED ORDER — SODIUM CHLORIDE 0.9 % IV SOLN
1.0000 g | Freq: Once | INTRAVENOUS | Status: AC
  Administered 2022-07-23: 1 g via INTRAVENOUS
  Filled 2022-07-23: qty 10

## 2022-07-23 MED ORDER — ADULT MULTIVITAMIN W/MINERALS CH: 1.0000 | ORAL_TABLET | Freq: Every day | ORAL | Status: DC

## 2022-07-23 MED ORDER — ACETAMINOPHEN 650 MG RE SUPP: 650.0000 mg | Freq: Four times a day (QID) | RECTAL | Status: DC | PRN

## 2022-07-23 MED ORDER — ADULT MULTIVITAMIN W/MINERALS CH
1.0000 | ORAL_TABLET | Freq: Every day | ORAL | Status: DC
  Administered 2022-07-24: 1 via ORAL
  Filled 2022-07-23: qty 1

## 2022-07-23 NOTE — H&P (Signed)
[facility] H&P   Patient Demographics:    David [name], is a 71 y.o. male  MRN: [id number]   DOB - 02-Sep-1951  Admit Date - 07/23/2022  Outpatient Primary MD for the patient is Sharilyn Sites, MD  Referring MD/NP/PA: Augustina Mood  Outpatient Specialists: Urology Dr. Diona Fanti  Patient coming from: Home  Chief Complaint  Patient presents with   Foley catheter problem      HPI:    David [name]  is a 71 y.o. male, with past medical history of BPH, urinary retention, Foley catheter for last few weeks, renal mass, on recent CT abdomen pelvis, followed by Dr. Diona Fanti, recent cystoscopy, patient was sent by his home health for further evaluation of foul-smelling urine/catheter problems, purulent urine output via catheter, and pain at Foley catheter site, patient has his Foley catheter since September, his Foley catheter was noted to have foul-smelling odor, he had some pain at the insertion site, home health had no Foley catheter  to change ,so they  sent him to ED for further evaluation. -In ED his work-up was significant for elevated creatinine at 2, from baseline of 1.24, his urine was significant for pyuria, Foley catheter was noted to be very foul-smelling, and to have purulent discharge, started on IV Rocephin, LFTs are elevated at baseline,  [facility] hospitalist consulted to admit.    Review of systems:      A full 10 point Review of Systems was done, except as stated above, all other Review of Systems were negative.   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 Freddie Apley note   Pneumonia    Seizure [facility])       Past Surgical History:  Procedure Laterality Date   COLONOSCOPY  05/31/2012   Procedure: COLONOSCOPY;  Surgeon: Jamesetta So, MD;  Location: [facility];  Service: Gastroenterology;  Laterality: N/A;   TONSILLECTOMY        Social History:     Social History   Tobacco Use   Smoking status: Former    Types: Cigarettes    Quit date: 12/21/2019    Years since quitting: 2.5   Smokeless tobacco: Never  Substance Use Topics   Alcohol use: No     Lives -home alone, home health providing RN visits  Mobility -independent     Family History :    History reviewed. No pertinent family history.   Home Medications:   Prior to Admission medications   Medication Sig Start Date End Date Taking? Authorizing Provider  multivitamin-iron-minerals-folic acid (THERAPEUTIC-M) TABS tablet Take 1 tablet by mouth daily.   Yes [provider]  tamsulosin (FLOMAX) 0.4 MG CAPS capsule Take 0.4 mg by mouth daily.  Yes [provider]  AREXVY 120 MCG/0.5ML injection  06/01/22   [provider]  cephALEXin (KEFLEX) 500 MG capsule Take 500 mg by mouth 4 (four) times daily. Patient not taking: Reported on 07/23/2022 06/22/22   [provider]  sulfamethoxazole-trimethoprim (BACTRIM DS) 800-160 MG tablet Take 1 tablet by mouth 2 (two) times daily. Patient not taking: Reported on 07/23/2022 07/23/22   [provider]     Allergies:    No Known Allergies   Physical Exam:   Vitals  Blood pressure 112/73, pulse 92, temperature 98 F (36.7 C), temperature source Oral, resp. rate 18, height '5\' 11"'$  (1.803 m), weight 122.5 kg, SpO2 100 %.   1. General well-developed male lying in bed in NAD,   2. Normal affect and insight, Not Suicidal or Homicidal, Awake Alert, Oriented X 3.  3. No F.N deficits, ALL C.Nerves Intact, Strength 5/5 all 4 extremities, Sensation intact all 4 extremities, Plantars down going.  4. Ears and Eyes appear Normal, Conjunctivae clear, PERRLA. Moist Oral Mucosa.  5. Supple Neck, No JVD, No cervical lymphadenopathy appriciated, No Carotid  Bruits.  6. Symmetrical Chest wall movement, Good air movement bilaterally, CTAB.  7. RRR, No Gallops, Rubs or Murmurs, No Parasternal Heave.  8. Positive Bowel Sounds, Abdomen Soft, No tenderness, No organomegaly appriciated,No rebound -guarding or rigidity.  9.  No Cyanosis, Normal Skin Turgor, No Skin Rash or Bruise.  10. Good muscle tone,  joints appear normal , no effusions, Normal ROM.  Foley catheter present, foul-smelling, with purulent urine output (not changed yet)   Data Review:    CBC Recent Labs  Lab 07/23/22 1627  WBC 6.4  HGB 10.2*  HCT 31.2*  PLT 175  MCV 97.5  MCH 31.9  MCHC 32.7  RDW 19.6*  LYMPHSABS 1.8  MONOABS 1.1*  EOSABS 0.1  BASOSABS 0.0   ------------------------------------------------------------------------------------------------------------------  Chemistries  Recent Labs  Lab 07/23/22 1627  NA 133*  K 3.3*  CL 101  CO2 20*  GLUCOSE 148*  BUN 41*  CREATININE 2.02*  CALCIUM 8.5*  AST 91*  ALT 64*  ALKPHOS 73  BILITOT 1.2   ------------------------------------------------------------------------------------------------------------------ estimated creatinine clearance is 45.3 mL/min (A) (by C-G formula based on SCr of 2.02 mg/dL (H)). ------------------------------------------------------------------------------------------------------------------ No results for input(s): "TSH", "T4TOTAL", "T3FREE", "THYROIDAB" in the last 72 hours.  Invalid input(s): "FREET3"  Coagulation profile No results for input(s): "INR", "PROTIME" in the last 168 hours. ------------------------------------------------------------------------------------------------------------------- No results for input(s): "DDIMER" in the last 72 hours. -------------------------------------------------------------------------------------------------------------------  Cardiac Enzymes No results for input(s): "CKMB", "TROPONINI", "MYOGLOBIN" in the last 168  hours.  Invalid input(s): "CK" ------------------------------------------------------------------------------------------------------------------ No results found for: "BNP"   ---------------------------------------------------------------------------------------------------------------  Urinalysis    Component Value Date/Time   COLORURINE AMBER (A) 07/23/2022 1828   APPEARANCEUR CLOUDY (A) 07/23/2022 1828   APPEARANCEUR Clear 06/16/2022 1138   LABSPEC 1.015 07/23/2022 1828   PHURINE 5.0 07/23/2022 1828   GLUCOSEU NEGATIVE 07/23/2022 1828   HGBUR MODERATE (A) 07/23/2022 1828   BILIRUBINUR NEGATIVE 07/23/2022 1828   BILIRUBINUR Negative 06/16/2022 1138   [place] 07/23/2022 1828   PROTEINUR 100 (A) 07/23/2022 1828   NITRITE NEGATIVE 07/23/2022 1828   LEUKOCYTESUR LARGE (A) 07/23/2022 1828    ----------------------------------------------------------------------------------------------------------------   Imaging Results:    No results found.     Assessment & Plan:    Principal Problem:   AKI (acute kidney injury) ([place]) Active Problems:   Renal mass   Lung nodule   Urinary retention  BPH (benign prostatic hyperplasia)    AKI -Due to volume depletion, dehydration, continue with IV fluids, and recheck BMP in a.m.  UTI related to Foley catheter - patient presents with foul-smelling urine, and dirty  catheter, catheter to be exchanged in ED, continue with IV Rocephin and follow on urine cultures  Urinary retention,  BPH,  Foley catheter present on admission  patient is followed by Dr. Diona Fanti as an outpatient, recent cystoscopy with no significant findings -Foley catheter to be changed on admission -Continue with Flomax  Renal mass -This was evident on CT abdomen pelvis 06/20/2022, will request urology evaluation during hospital stay if possible, as well patient instructed to follow with his urologist as an outpatient, he and his friend at bedside  were informed about the importance of the follow-up to rule out malignancy  Pulmonary nodule -This was present on CT chest obtained 07/16/2022 as an outpatient -We will need repeat imaging as an outpatient, discussed with patient importance of follow-up  Hypokalemia -Repleted, will recheck in a.m.  Transaminitis -This appears to be chronic problem, actually his liver enzymes are better than the baseline, he does carry history of hepatitis C status posttreatment.  DVT Prophylaxis Heparin   AM Labs Ordered, also please review Full Orders  Family Communication: Admission, patients condition and plan of care including tests being ordered have been discussed with the patient and friend at bedside* who indicate understanding and agree with the plan and Code Status.  Code Status full code  Likely DC to home  Condition GUARDED  Consults called: None, requested urology consult  Admission status: Observation  Time spent in minutes : 70 minutes   Phillips Climes M.D on 07/23/2022 at 7:45 PM   [facility] - Office  [phone]

## 2022-07-23 NOTE — Discharge Instructions (Addendum)
PLEASE FOLLOW UP WITH UROLOGY AS SOON AS POSSIBLE   PLEASE CHANGE OUT FOLEY CATHETER REGULARLY    IMPORTANT INFORMATION: PAY CLOSE ATTENTION   PHYSICIAN DISCHARGE INSTRUCTIONS  Follow with Primary care provider  Sharilyn Sites, MD  and other consultants as instructed by your Hospitalist Physician  [place] IF SYMPTOMS COME BACK, WORSEN OR NEW PROBLEM DEVELOPS   Please note: You were cared for by a hospitalist during your hospital stay. Every effort will be made to forward records to your primary care provider.  You can request that your primary care provider send for your hospital records if they have not received them.  Once you are discharged, your primary care physician will handle any further medical issues. Please note that NO REFILLS for any discharge medications will be authorized once you are discharged, as it is imperative that you return to your primary care physician (or establish a relationship with a primary care physician if you do not have one) for your post hospital discharge needs so that they can reassess your need for medications and monitor your lab values.  Please get a complete blood count and chemistry panel checked by your Primary MD at your next visit, and again as instructed by your Primary MD.  Get Medicines reviewed and adjusted: Please take all your medications with you for your next visit with your Primary MD  Laboratory/radiological data: Please request your Primary MD to go over all hospital tests and procedure/radiological results at the follow up, please ask your primary care provider to get all Hospital records sent to his/her office.  In some cases, they will be blood work, cultures and biopsy results pending at the time of your discharge. Please request that your primary care provider follow up on these results.  If you are diabetic, please bring your blood sugar readings with you to your follow up appointment with  primary care.    Please call and make your follow up appointments as soon as possible.    Also Note the following: If you experience worsening of your admission symptoms, develop shortness of breath, life threatening emergency, suicidal or homicidal thoughts you must seek medical attention immediately by calling 911 or calling your MD immediately  if symptoms less severe.  You must read complete instructions/literature along with all the possible adverse reactions/side effects for all the Medicines you take and that have been prescribed to you. Take any new Medicines after you have completely understood and accpet all the possible adverse reactions/side effects.   Do not drive when taking Pain medications or sleeping medications (Benzodiazepines)  Do not take more than prescribed Pain, Sleep and Anxiety Medications. It is not advisable to combine anxiety,sleep and pain medications without talking with your primary care practitioner  Special Instructions: If you have smoked or chewed Tobacco  in the last 2 yrs please stop smoking, stop any regular Alcohol  and or any Recreational drug use.  Wear Seat belts while driving.  Do not drive if taking any narcotic, mind altering or controlled substances or recreational drugs or alcohol.

## 2022-07-23 NOTE — Telephone Encounter (Signed)
Home health nurse called with concern that patient may have a possible uti. Home health supplies to change catheter will not be availble for a couple of days.  Per office manager patient needs to go to ER. Home Health nurse made aware.   Per last ov note patient needs a left nephrectomy.  Asked surgery scheduler if this has been scheduled and she states she has not received a posting sheet. Just checking to see if Dr. Alyson Ingles has been made aware.

## 2022-07-23 NOTE — ED Provider Notes (Addendum)
[facility] EMERGENCY DEPARTMENT Provider Note   CSN: [id number] Arrival date & time: 07/23/22  1544     History Chief Complaint  Patient presents with   Foley catheter problem    David [name] is a 71 y.o. male patient who presents to the emergency department today for further evaluation of dysuria, foul-smelling urine, and catheter problem.  Patient has had a indwelling catheter since September.  This particular catheter has been in for over 30 days.  Over the last week he has noticed the above symptoms.  He denies any fever or chills.  HPI     Home Medications Prior to Admission medications   Medication Sig Start Date End Date Taking? Authorizing Provider  AREXVY 120 MCG/0.5ML injection  06/01/22   [provider]  calcium carbonate (OS-CAL) 600 MG TABS Take 600 mg by mouth daily. Calcium '600mg'$  with Vitamin D    [provider]  cephALEXin (KEFLEX) 500 MG capsule Take 500 mg by mouth 4 (four) times daily. 06/22/22   [provider]  multivitamin-iron-minerals-folic acid (THERAPEUTIC-M) TABS tablet Take 1 tablet by mouth daily.    [provider]  tamsulosin (FLOMAX) 0.4 MG CAPS capsule Take 0.4 mg by mouth daily.     [provider]      Allergies    Patient has no known allergies.    Review of Systems   Review of Systems  All other systems reviewed and are negative.   Physical Exam Updated Vital Signs BP 112/73   Pulse 92   Temp 98 F (36.7 C) (Oral)   Resp 18   Ht '5\' 11"'$  (1.803 m)   Wt 122.5 kg   SpO2 100%   BMI 37.66 kg/m  Physical Exam Vitals and nursing note reviewed.  Constitutional:      General: He is not in acute distress.    Appearance: Normal appearance.  HENT:     Head: Normocephalic and atraumatic.  Eyes:     General:        Right eye: No discharge.        Left eye: No discharge.  Cardiovascular:     Comments: Regular rate and rhythm.  S1/S2 are distinct without any evidence of murmur, rubs,  or gallops.  Radial pulses are 2+ bilaterally.  Dorsalis pedis pulses are 2+ bilaterally.  No evidence of pedal edema. Pulmonary:     Comments: Clear to auscultation bilaterally.  Normal effort.  No respiratory distress.  No evidence of wheezes, rales, or rhonchi heard throughout. Abdominal:     General: Abdomen is flat. Bowel sounds are normal. There is no distension.     Tenderness: There is no abdominal tenderness. There is no guarding or rebound.  Musculoskeletal:        General: Normal range of motion.     Cervical back: Neck supple.  Skin:    General: Skin is warm and dry.     Findings: No rash.  Neurological:     General: No focal deficit present.     Mental Status: He is alert.  Psychiatric:        Mood and Affect: Mood normal.        Behavior: Behavior normal.     ED Results / Procedures / Treatments   Labs (all labs ordered are listed, but only abnormal results are displayed) Labs Reviewed  URINALYSIS, ROUTINE W REFLEX MICROSCOPIC - Abnormal; Notable for the following components:      Result Value   Color,  Urine AMBER (*)    APPearance CLOUDY (*)    Hgb urine dipstick MODERATE (*)    Protein, ur 100 (*)    Leukocytes,Ua LARGE (*)    WBC, UA >50 (*)    Bacteria, UA FEW (*)    All other components within normal limits  CBC WITH DIFFERENTIAL/PLATELET - Abnormal; Notable for the following components:   RBC 3.20 (*)    Hemoglobin 10.2 (*)    HCT 31.2 (*)    RDW 19.6 (*)    Monocytes Absolute 1.1 (*)    All other components within normal limits  COMPREHENSIVE METABOLIC PANEL - Abnormal; Notable for the following components:   Sodium 133 (*)    Potassium 3.3 (*)    CO2 20 (*)    Glucose, Bld 148 (*)    BUN 41 (*)    Creatinine, Ser 2.02 (*)    Calcium 8.5 (*)    Albumin 3.3 (*)    AST 91 (*)    ALT 64 (*)    GFR, Estimated 35 (*)    All other components within normal limits  URINE CULTURE    EKG None  Radiology No results  found.  Procedures Procedures    Medications Ordered in ED Medications  cefTRIAXone (ROCEPHIN) 1 g in sodium chloride 0.9 % 100 mL IVPB (has no administration in time range)  sodium chloride 0.9 % bolus 1,000 mL (1,000 mLs Intravenous New Bag/Given 07/23/22 1804)    ED Course/ Medical Decision Making/ A&P Clinical Course as of 07/23/22 1925  Thu Jul 23, 2022  1846 CBC with Differential(!) No evidence of leukocytosis.  There is evidence of anemia which seems to the patient's baseline in comparison to the last month. [CF]  1846 Comprehensive metabolic panel(!) There is evidence of hyponatremia, hypokalemia.  There is evidence of AKI in comparison to previous results.  There is evidence of transaminitis with seems to be at the patient's baseline. [CF]  1924 I spoke with Dr. Waldron Labs with [facility] hospitalist who agrees to admit the patient. [CF]    Clinical Course User Index [CF] Hendricks Limes, PA-C                           Medical Decision Making David [name] is a 71 y.o. male patient who presents to the emergency department today for further evaluation of dysuria and Foley catheter problem.  This been ongoing for 1 week.  The likelihood that his urine is infected is high considering he does have the indwelling catheter.  I will get basic labs in addition to urine culture.  As highlighted in ED course, there is evidence of AKI and UTI.  Given the clinical scenario, I would prefer the patient to stay in the hospital for IV hydration and antibiotics. I will work on getting him admitted to the hospitalist service  Amount and/or Complexity of Data Reviewed Labs: ordered. Decision-making details documented in ED Course.   Final Clinical Impression(s) / ED Diagnoses Final diagnoses:  Acute cystitis with hematuria  AKI (acute kidney injury) ([place])  Foley catheter in place    Rx / DC Orders ED Discharge Orders     None         Hendricks Limes, PA-C 07/23/22 1910     Hendricks Limes, PA-C 07/23/22 1925    Lajean Saver, MD 07/24/22 [id number]

## 2022-07-23 NOTE — ED Triage Notes (Signed)
Pt via POV c/o leaking foley and cloudy urine. Had original indwelling catheter placed at the beginning of October due to urinary retention/possible renal mass and had to have it changed several times since due to line occlusion. Now he states it has been over 30 days since the last cath change and it is leaking around the bag and his urine appears discolored. Home health advised pt to come for eval.

## 2022-07-24 ENCOUNTER — Telehealth: Payer: Self-pay

## 2022-07-24 DIAGNOSIS — N401 Enlarged prostate with lower urinary tract symptoms: Secondary | ICD-10-CM | POA: Diagnosis not present

## 2022-07-24 DIAGNOSIS — R339 Retention of urine, unspecified: Secondary | ICD-10-CM

## 2022-07-24 DIAGNOSIS — N179 Acute kidney failure, unspecified: Secondary | ICD-10-CM | POA: Diagnosis not present

## 2022-07-24 LAB — CBC
HCT: 27 % — ABNORMAL LOW (ref 39.0–52.0)
Hemoglobin: 9 g/dL — ABNORMAL LOW (ref 13.0–17.0)
MCH: 32.3 pg (ref 26.0–34.0)
MCHC: 33.3 g/dL (ref 30.0–36.0)
MCV: 96.8 fL (ref 80.0–100.0)
Platelets: 140 10*3/uL — ABNORMAL LOW (ref 150–400)
RBC: 2.79 MIL/uL — ABNORMAL LOW (ref 4.22–5.81)
RDW: 19.6 % — ABNORMAL HIGH (ref 11.5–15.5)
WBC: 5.9 10*3/uL (ref 4.0–10.5)
nRBC: 0 % (ref 0.0–0.2)

## 2022-07-24 LAB — BASIC METABOLIC PANEL
Anion gap: 8 (ref 5–15)
BUN: 34 mg/dL — ABNORMAL HIGH (ref 8–23)
CO2: 21 mmol/L — ABNORMAL LOW (ref 22–32)
Calcium: 8.3 mg/dL — ABNORMAL LOW (ref 8.9–10.3)
Chloride: 108 mmol/L (ref 98–111)
Creatinine, Ser: 1.42 mg/dL — ABNORMAL HIGH (ref 0.61–1.24)
GFR, Estimated: 53 mL/min — ABNORMAL LOW (ref >60)
Glucose, Bld: 130 mg/dL — ABNORMAL HIGH (ref 70–99)
Potassium: 3.8 mmol/L (ref 3.5–5.1)
Sodium: 137 mmol/L (ref 135–145)

## 2022-07-24 LAB — URINE CULTURE

## 2022-07-24 LAB — HIV ANTIBODY (ROUTINE TESTING W REFLEX): HIV Screen 4th Generation wRfx: NONREACTIVE

## 2022-07-24 MED ORDER — CEFDINIR 300 MG PO CAPS: 300.0000 mg | ORAL_CAPSULE | Freq: Two times a day (BID) | ORAL | 0 refills | Status: AC

## 2022-07-24 NOTE — Evaluation (Addendum)
Physical Therapy Evaluation Patient Details Name: David [name] MRN: [id number] DOB: [birth year] Today's Date: 07/24/2022  History of Present Illness  David [name]  is a 71 y.o. male, with past medical history of BPH, urinary retention, Foley catheter for last few weeks, renal mass, on recent CT abdomen pelvis, followed by Dr. Diona Fanti, recent cystoscopy, patient was sent by his home health for further evaluation of foul-smelling urine/catheter problems, purulent urine output via catheter, and pain at Foley catheter site, patient has his Foley catheter since September, his Foley catheter was noted to have foul-smelling odor, he had some pain at the insertion site, home health had no Foley catheter  to change ,so they  sent him to ED for further evaluation.   Clinical Impression  Patient functioning near baseline for mobility and gait. He is seated in chair at beginning of session and requires UE use and momentum to transfer to standing without AD. Patient ambulates without loss of balance without use of AD and returns to room at end of session. Patient discharged to care of nursing for ambulation daily as tolerated for length of stay.     Recommendations for follow up therapy are one component of a multi-disciplinary discharge planning process, led by the attending physician.  Recommendations may be updated based on patient status, additional functional criteria and insurance authorization.  Follow Up Recommendations No PT follow up      Assistance Recommended at Discharge PRN  Patient can return home with the following       Equipment Recommendations None recommended by PT  Recommendations for Other Services       Functional Status Assessment Patient has had a recent decline in their functional status and demonstrates the ability to make significant improvements in function in a reasonable and predictable amount of time.     Precautions / Restrictions Precautions Precautions:  Fall Restrictions Weight Bearing Restrictions: No      Mobility  Bed Mobility               General bed mobility comments: seated in chair at beginning of session    Transfers Overall transfer level: Modified independent                 General transfer comment: labored, requires momentum and UE use on arm rests    Ambulation/Gait Ambulation/Gait assistance: Independent Gait Distance (Feet): 150 Feet Assistive device: None Gait Pattern/deviations: Step-through pattern       General Gait Details: ambultes with slighlty unsteady cadence without loss of balance or AD use, near baseline  Stairs            Wheelchair Mobility    Modified Rankin (Stroke Patients Only)       Balance Overall balance assessment: Mild deficits observed, not formally tested                                           Pertinent Vitals/Pain Pain Assessment Pain Assessment: No/denies pain    Home Living Family/patient expects to be discharged to:: Private residence Living Arrangements: Alone Available Help at Discharge: Family Type of Home: House Home Access: Stairs to enter Entrance Stairs-Rails: Right;Left;Can reach both Entrance Stairs-Number of Steps: 7   Home Layout: One level Home Equipment: Conservation officer, nature (2 wheels);Cane - single point      Prior Function Prior Level of Function : Independent/Modified Independent  Mobility Comments: states community ambulation without AD       Hand Dominance        Extremity/Trunk Assessment   Upper Extremity Assessment Upper Extremity Assessment: Overall WFL for tasks assessed    Lower Extremity Assessment Lower Extremity Assessment: Overall WFL for tasks assessed    Cervical / Trunk Assessment Cervical / Trunk Assessment: Normal  Communication   Communication: No difficulties  Cognition Arousal/Alertness: Awake/alert Behavior During Therapy: WFL for tasks  assessed/performed Overall Cognitive Status: Within Functional Limits for tasks assessed                                          General Comments      Exercises     Assessment/Plan    PT Assessment Patient does not need any further PT services  PT Problem List Decreased strength;Decreased activity tolerance;Decreased balance;Decreased mobility       PT Treatment Interventions DME instruction;Balance training;Gait training;Neuromuscular re-education;Stair training;Functional mobility training;Patient/family education;Therapeutic activities;Therapeutic exercise;Manual techniques    PT Goals (Current goals can be found in the Care Plan section)  Acute Rehab PT Goals Patient Stated Goal: return home PT Goal Formulation: With patient Time For Goal Achievement: 07/24/22 Potential to Achieve Goals: Good    Frequency       Co-evaluation               AM-PAC PT "6 Clicks" Mobility  Outcome Measure Help needed turning from your back to your side while in a flat bed without using bedrails?: None Help needed moving from lying on your back to sitting on the side of a flat bed without using bedrails?: None Help needed moving to and from a bed to a chair (including a wheelchair)?: None Help needed standing up from a chair using your arms (e.g., wheelchair or bedside chair)?: None Help needed to walk in hospital room?: None Help needed climbing 3-5 steps with a railing? : None 6 Click Score: 24    End of Session   Activity Tolerance: Patient tolerated treatment well Patient left: in chair;with call bell/phone within reach Nurse Communication: Mobility status PT Visit Diagnosis: Unsteadiness on feet (R26.81);Other abnormalities of gait and mobility (R26.89)    Time: 3646-8032 PT Time Calculation (min) (ACUTE ONLY): 10 min   Charges:   PT Evaluation $PT Eval Low Complexity: 1 Low PT Treatments $Therapeutic Activity: 8-22 mins        11:32 AM,  07/24/22 Mearl Latin PT, DPT Physical Therapist at [facility]

## 2022-07-24 NOTE — Telephone Encounter (Signed)
Patient's sister is asking where patient has been referred for care.  Do you have a referral from Dr. Diona Fanti?  Sisters call back is [phone]

## 2022-07-24 NOTE — Progress Notes (Signed)
Vitals stable and pt slept through night. Pt reported 0/10 pain. PIV placed in right AC.

## 2022-07-24 NOTE — TOC Transition Note (Signed)
Transition of Care [facility]) - CM/SW Discharge Note   Patient Details  Name: ABDELRAHMAN NAIR MRN: [id number] Date of Birth: 1951-08-21  [facility] of [facility]) CM/SW Contact:  Iona Beard, [place] Phone Number: 07/24/2022, 9:55 AM   Clinical Narrative:    [facility] consulted for resumption of HH needs. CSW spoke to [place] with [place] who states they are active with pt for [facility] RN services. CSW requested that MD place [facility] orders. TOC signing off.   Final next level of care: [place] Barriers to Discharge: Barriers Resolved   Patient Goals and CMS Choice Patient states their goals for this hospitalization and ongoing recovery are:: Return home with [facility] RN CMS Medicare.gov Compare Post Acute Care list provided to:: Patient Choice offered to / list presented to : Patient  Discharge Placement                       Discharge Plan and Services                          HH Arranged: RN [facility] Agency: [place] Date [place]: 07/24/22   Representative spoke with at [place]: Marjory Lies  Social Determinants of Health ([place]) Interventions     Readmission Risk Interventions     No data to display

## 2022-07-24 NOTE — Discharge Summary (Signed)
Physician Discharge Summary  LENNOX DOLBERRY DZH:[id number] DOB: Nov 09, 1950 DOA: 07/23/2022  PCP: Sharilyn Sites, MD UROLOGY: [place]/ urology [place] Admit date: 07/23/2022 Discharge date: 07/24/2022  Admitted From: [place]  Disposition: [place]   Recommendations for Outpatient Follow-up:  Follow up with PCP in 1 weeks Please obtain BMP/CBC in one week Please follow up on the following pending results:  Home Health: RN  Discharge Condition: STABLE   CODE STATUS: FULL DIET: [place] DIET   Brief Hospitalization Summary: Please see all hospital notes, images, labs for full details of the hospitalization. ADMISSION HPI:  71 y.o. male, with past medical history of BPH, urinary retention, Foley catheter for last few weeks, renal mass, on recent CT abdomen pelvis, followed by Dr. Diona Fanti, recent cystoscopy, patient was sent by his home health for further evaluation of foul-smelling urine/catheter problems, purulent urine output via catheter, and pain at Foley catheter site, patient has his Foley catheter since September, his Foley catheter was noted to have foul-smelling odor, he had some pain at the insertion site, home health had no Foley catheter  to change ,so they  sent him to ED for further evaluation. -In ED his work-up was significant for elevated creatinine at 2, from baseline of 1.24, his urine was significant for pyuria, Foley catheter was noted to be very foul-smelling, and to have purulent discharge, started on IV Rocephin, LFTs are elevated at baseline,  [facility] hospital[facility] consulted to admit.  HOSPITAL COURSE  Patient was admitted for treatment of a UTI associated with Foley catheter that had been in for over 30 days.  He was started on IV ceftriaxone and cultures pending at this time.  He was also noted to have an acute kidney injury treated with IV fluids which improved his creatinine after treatments.  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  I reached out to [facility] health urology in [place] and spoke with Dr. Alyson Ingles who recommended patient have outpatient follow-up for this.  I placed an ambulatory referral urgent for urology follow-up regarding renal cell mass and need for nephrectomy.  I explained the plan to the patient and he verbalized understanding.  He will be discharged home on a course of oral cefdinir to complete 5 days.  Follow-up final urine cultures as the cultures are still pending at time of discharge.  Also reviewed Foley catheter care and provided written instructions for patient and we plan to resume his home health RN which she had prior to admission.  Discharge Diagnoses:  Principal Problem:   AKI (acute kidney injury) ([place]) Active Problems:   Renal mass   Lung nodule   Urinary retention   BPH (benign prostatic hyperplasia)   Discharge Instructions: Discharge Instructions     Ambulatory referral to Urology   Complete by: As directed       Allergies as of 07/24/2022   No Known Allergies      Medication List     STOP taking these medications    Arexvy 120 MCG/0.5ML injection Generic drug: RSV vaccine recomb adjuvanted   cephALEXin 500 MG capsule Commonly known as: KEFLEX   sulfamethoxazole-trimethoprim 800-160 MG tablet Commonly known as: BACTRIM DS       TAKE these medications    cefdinir 300 MG capsule Commonly known as: OMNICEF Take 1 capsule (300 mg total) by mouth 2 (two) times daily for 5 days.  multivitamin-iron-minerals-folic acid Tabs tablet Take 1 tablet by mouth daily.   tamsulosin 0.4 MG Caps capsule Commonly known as: FLOMAX Take 0.4 mg by mouth daily.        Follow-up Information     Sharilyn Sites, MD. Schedule an appointment as soon as possible for a visit in 1 week(s).   Specialty: Family Medicine Why: Hospital Follow Up Contact information: [address] [place] [address] [phone]         [place]. Schedule an appointment as soon as possible for a visit in 1 week(s).   Why: Hospital Follow Up Contact information: [address] [place] [address] [phone]               No Known Allergies Allergies as of 07/24/2022   No Known Allergies      Medication List     STOP taking these medications    Arexvy 120 MCG/0.5ML injection Generic drug: RSV vaccine recomb adjuvanted   cephALEXin 500 MG capsule Commonly known as: KEFLEX   sulfamethoxazole-trimethoprim 800-160 MG tablet Commonly known as: BACTRIM DS       TAKE these medications    cefdinir 300 MG capsule Commonly known as: OMNICEF Take 1 capsule (300 mg total) by mouth 2 (two) times daily for 5 days.   multivitamin-iron-minerals-folic acid Tabs tablet Take 1 tablet by mouth daily.   tamsulosin 0.4 MG Caps capsule Commonly known as: FLOMAX Take 0.4 mg by mouth daily.        Procedures/Studies: CT CHEST WO CONTRAST  Result Date: 07/18/2022 CLINICAL DATA:  Staging for renal mass. * Tracking Code: BO * EXAM: CT CHEST WITHOUT CONTRAST TECHNIQUE: Multidetector CT imaging of the chest was performed following the standard protocol without IV contrast. RADIATION DOSE REDUCTION: This exam was performed according to the departmental dose-optimization program which includes automated exposure control, adjustment of the mA and/or kV according to patient size and/or use of iterative reconstruction technique. COMPARISON:  February 2006 CT of the chest. Abdomen and pelvis exam from October of 2023. FINDINGS: Cardiovascular: Aortic atherosclerosis. No signs of aneurysmal dilation of the thoracic aorta. Normal heart size without pericardial effusion. Calcified coronary artery disease of LEFT coronary circulation. Normal caliber of central pulmonary vasculature. Limited assessment of cardiovascular  structures given lack of intravenous contrast. Mediastinum/Nodes: No thoracic inlet lymphadenopathy. No axillary lymphadenopathy. No hilar lymphadenopathy. Top-normal size subcarinal lymph node 12 mm. Lungs/Pleura: Signs of pulmonary emphysema. Basilar scarring and atelectasis. No pleural effusion. No consolidation. Airways are patent. There was a small RIGHT lower lobe pulmonary nodule. There is a nodular area in the RIGHT lung base that is more conspicuous than this nodule was on the prior exam (image 124/4. In total this area measures 8 x 6 mm but is obscured by atelectatic change potentially largely a result of atelectatic change surrounding and or involving the RIGHT lower lobe nodule that was present in the RIGHT lower lobe on imaging from October. Paragraphs small juxtapleural nodule in the RIGHT upper lobe may be partially calcified at 3 mm. Upper Abdomen: The known mass in the RIGHT kidney is not well evaluated. There signs of liver disease with fissural widening of hepatic fissures and mild caudate hypertrophy. Imaged portions of pancreas and adrenal glands are unremarkable. No acute gastrointestinal findings in the upper abdomen. Musculoskeletal: Spinal degenerative changes. No acute or destructive bone process. Signs of prior trauma to the LEFT hemithorax with healed rib fractures along the LEFT chest. IMPRESSION: 1.  Signs of pulmonary emphysema. 2. Small nodule in the RIGHT lung base seen on previous imaging may now be obscured by atelectasis or post infectious or mild inflammatory changes no definitive signs of pulmonary metastatic disease on the current study. Suggest short interval follow-up for further evaluation. 3. Top-normal size subcarinal lymph node measuring 12 mm. Attention on follow-up. 4. Calcified coronary artery disease of LEFT coronary circulation. 5. Fissural widening of hepatic fissures and mild caudate hypertrophy raising the question of hepatic liver disease. 6. The known mass in the  RIGHT kidney is not well evaluated. 7. Aortic atherosclerosis. Aortic Atherosclerosis (ICD10-I70.0) and Emphysema (ICD10-J43.9). Electronically Signed   By: Zetta Bills M.D.   On: 07/18/2022 21:40     Subjective Pt without complaints, says he will make every effort to follow up with urology office regarding kidney tumor  Discharge Exam: Vitals:   07/23/22 2100 07/24/22 0441  BP: 135/82 123/72  Pulse: 98 84  Resp: 20 12  Temp: 98.2 F (36.8 C) 98.1 F (36.7 C)  SpO2: 94% 99%   Vitals:   07/23/22 1800 07/23/22 1830 07/23/22 2100 07/24/22 0441  BP: 120/68 112/73 135/82 123/72  Pulse: (!) 130 92 98 84  Resp:  '18 20 12  '$ Temp:   98.2 F (36.8 C) 98.1 F (36.7 C)  TempSrc:    Oral  SpO2: 95% 100% 94% 99%  Weight:   116.3 kg   Height:       General: Pt is alert, awake, not in acute distress Cardiovascular: RRR, S1/S2 +, no rubs, no gallops Respiratory: CTA bilaterally, no wheezing, no rhonchi Abdominal: Soft, NT, ND, bowel sounds + Extremities: no edema, no cyanosis   The results of significant diagnostics from this hospitalization (including imaging, microbiology, ancillary and laboratory) are listed below for reference.     Microbiology: No results found for this or any previous visit (from the past 240 hour(s)).   Labs: BNP (last 3 results) No results for input(s): "BNP" in the last 8760 hours. Basic Metabolic Panel: Recent Labs  Lab 07/23/22 1627 07/24/22 0315  NA 133* 137  K 3.3* 3.8  CL 101 108  CO2 20* 21*  GLUCOSE 148* 130*  BUN 41* 34*  CREATININE 2.02* 1.42*  CALCIUM 8.5* 8.3*   Liver Function Tests: Recent Labs  Lab 07/23/22 1627  AST 91*  ALT 64*  ALKPHOS 73  BILITOT 1.2  PROT 7.6  ALBUMIN 3.3*   No results for input(s): "LIPASE", "AMYLASE" in the last 168 hours. No results for input(s): "AMMONIA" in the last 168 hours. CBC: Recent Labs  Lab 07/23/22 1627 07/24/22 0315  WBC 6.4 5.9  NEUTROABS 3.3  --   HGB 10.2* 9.0*  HCT 31.2*  27.0*  MCV 97.5 96.8  PLT 175 140*   Cardiac Enzymes: No results for input(s): "CKTOTAL", "CKMB", "CKMBINDEX", "TROPONINI" in the last 168 hours. BNP: Invalid input(s): "POCBNP" CBG: No results for input(s): "GLUCAP" in the last 168 hours. D-Dimer No results for input(s): "DDIMER" in the last 72 hours. Hgb A1c No results for input(s): "HGBA1C" in the last 72 hours. Lipid Profile No results for input(s): "CHOL", "HDL", "LDLCALC", "TRIG", "CHOLHDL", "LDLDIRECT" in the last 72 hours. Thyroid function studies No results for input(s): "TSH", "T4TOTAL", "T3FREE", "THYROIDAB" in the last 72 hours.  Invalid input(s): "FREET3" Anemia work up No results for input(s): "VITAMINB12", "FOLATE", "FERRITIN", "TIBC", "IRON", "RETICCTPCT" in the last 72 hours. Urinalysis    Component Value Date/Time   COLORURINE AMBER (A) 07/23/2022 1828  APPEARANCEUR CLOUDY (A) 07/23/2022 1828   APPEARANCEUR Clear 06/16/2022 1138   LABSPEC 1.015 07/23/2022 1828   PHURINE 5.0 07/23/2022 1828   GLUCOSEU NEGATIVE 07/23/2022 1828   HGBUR MODERATE (A) 07/23/2022 1828   BILIRUBINUR NEGATIVE 07/23/2022 1828   BILIRUBINUR Negative 06/16/2022 1138   KETONESUR NEGATIVE 07/23/2022 1828   PROTEINUR 100 (A) 07/23/2022 1828   NITRITE NEGATIVE 07/23/2022 1828   LEUKOCYTESUR LARGE (A) 07/23/2022 1828   Sepsis Labs Recent Labs  Lab 07/23/22 1627 07/24/22 0315  WBC 6.4 5.9   Microbiology No results found for this or any previous visit (from the past 240 hour(s)).  Time coordinating discharge:   SIGNED:  Irwin Brakeman, MD  [facility] 07/24/2022, 10:42 AM How to contact the [facility] Attending or Consulting provider [place] or covering provider during after hours [place], for this patient?  Check the care team in [facility] and look for a) attending/consulting [facility] provider listed and b) the [facility] team listed Log into [email or web address] and use [facility] universal password to access. If you do not have the password,  please contact the hospital operator. Locate the [facility] provider you are looking for under [facility] and page to a number that you can be directly reached. If you still have difficulty reaching the provider, please page the [facility] (Director on Call) for the Hospitalists listed on amion for assistance.

## 2022-07-24 NOTE — Progress Notes (Signed)
Discussed discharge information with pt, he understands the instructions. He has no questions at this time.

## 2022-07-27 NOTE — Progress Notes (Signed)
History of Present Illness:   10.23.2023: 71 year old male presents for follow-up of gross hematuria.  The patient presented to the emergency room approximately 9 days ago with a 2-day history of gross hematuria, painless.  He was passing clots.  In the emergency room he was noted to have gross hematuria, felt to be possible urinary tract infection.  He was sent home on Keflex.  Urine culture revealed slight growth of strep.  Over the past 3 days he has not had any further gross hematuria or lower urinary tract symptoms.   He does have a remote history of gross hematuria with a UTI in the past.  He is a smoker.   He has had no abdominal or flank complaints.  No prior history of kidney stones.    He was treated for trichomonas   10.17.2023: Here for cysto. 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 High attenuation blood clot within left renal collecting system and urinary bladder. No evidence of metastatic disease. Aortic Atherosclerosis (ICD10-I70.0).   He has had 2 visits to ER for clot retention.  11.14.2023: Here for follow-up.  He had recent CT of chest which revealed:  1. Signs of pulmonary emphysema. 2. Small nodule in the RIGHT lung base seen on previous imaging may now be obscured by atelectasis or post infectious or mild inflammatory changes no definitive signs of pulmonary metastatic disease on the current study. Suggest short interval follow-up for further evaluation. 3. Top-normal size subcarinal lymph node measuring 12 mm. Attention on follow-up. 4. Calcified coronary artery disease of LEFT coronary circulation. 5. Fissural widening of hepatic fissures and mild caudate hypertrophy raising the question of hepatic liver disease. 6. The known mass in the RIGHT kidney is not well evaluated. 7. Aortic atherosclerosis.   Aortic Atherosclerosis (ICD10-I70.0) and Emphysema (ICD10-J43.9).  He had recent  admission for presumed UTI with indwelling Foley catheter.  His catheter was changed about 5 days ago.  He has had some blood in his urine but not much.  Certainly no clot retention.  During his hospitalization, creatinine at the time of discharge was 1.42.  Hemoglobin trending downward at about 9.  He comes in today with his sister and brother-in-law who, after his eventual surgery will be caregivers.  They have multiple questions.   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 Freddie Apley note   Pneumonia    Seizure [facility])     Past Surgical History:  Procedure Laterality Date   COLONOSCOPY  05/31/2012   Procedure: COLONOSCOPY;  Surgeon: Jamesetta So, MD;  Location[facility];  Service: Gastroenterology;  Laterality: N/A;   TONSILLECTOMY      Home Medications:  Allergies as of 07/28/2022   No Known Allergies      Medication List        Accurate as of July 27, 2022  9:21 PM. If you have any questions, ask your nurse or doctor.          cefdinir 300 MG capsule Commonly known as: OMNICEF Take 1 capsule (300 mg total) by mouth 2 (two) times daily for 5 days.   multivitamin-iron-minerals-folic acid Tabs tablet Take 1 tablet by mouth daily.   tamsulosin 0.4 MG Caps capsule Commonly known as: FLOMAX Take 0.4 mg by mouth daily.  Allergies: No Known Allergies  No family history on file.  Social History:  reports that he quit smoking about 2 years ago. His smoking use included cigarettes. He has never used smokeless tobacco. He reports that he does not drink alcohol and does not use drugs.  ROS: A complete review of systems was performed.  All systems are negative except for pertinent findings as noted.  Physical Exam:  Vital signs in last 24 hours: There were no vitals taken for this visit. Constitutional:  Alert and oriented, No acute  distress Cardiovascular: Regular rate  Respiratory: Normal respiratory effort Neurologic: Grossly intact, no focal deficits Psychiatric: Normal mood and affect  I have reviewed prior pt notes  I have reviewed notes from referring/previous physicians--recent discharge summary reviewed  I have reviewed recent urine culture results  I have independently reviewed prior imaging-I shared chest CT findings with them    Impression/Assessment:  1.  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  No direct evidence of extrarenal involvement at this time  2.  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  I answered all the questions that the patient, his sister and brother-in-law had to the best of my ability  2.  I will fill out a posting sheet.  The soonest any of our [facility] physicians can perform robotic nephrectomy will be late December, perhaps the 22nd or later.  Apparently, Dr. Alyson Ingles has a date sooner than that.  I will discuss with him if he can do this procedure  3.  We will get clearance from Dr. Hilma Favors  4.  I did discuss the fact that I think it would be good for his sister and brother-in-law to attend to him for a couple of weeks postoperatively.  I also discussed the fact that with his mild renal insufficiency, he will need to be carefully followed for his renal function postoperatively

## 2022-07-27 NOTE — H&P (View-Only) (Signed)
History of Present Illness:   10.23.2023: 71 year old male presents for follow-up of gross hematuria.  The patient presented to the emergency room approximately 9 days ago with a 2-day history of gross hematuria, painless.  He was passing clots.  In the emergency room he was noted to have gross hematuria, felt to be possible urinary tract infection.  He was sent home on Keflex.  Urine culture revealed slight growth of strep.  Over the past 3 days he has not had any further gross hematuria or lower urinary tract symptoms.   He does have a remote history of gross hematuria with a UTI in the past.  He is a smoker.   He has had no abdominal or flank complaints.  No prior history of kidney stones.    He was treated for trichomonas   10.17.2023: Here for cysto. 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 High attenuation blood clot within left renal collecting system and urinary bladder. No evidence of metastatic disease. Aortic Atherosclerosis (ICD10-I70.0).   He has had 2 visits to ER for clot retention.  11.14.2023: Here for follow-up.  He had recent CT of chest which revealed:  1. Signs of pulmonary emphysema. 2. Small nodule in the RIGHT lung base seen on previous imaging may now be obscured by atelectasis or post infectious or mild inflammatory changes no definitive signs of pulmonary metastatic disease on the current study. Suggest short interval follow-up for further evaluation. 3. Top-normal size subcarinal lymph node measuring 12 mm. Attention on follow-up. 4. Calcified coronary artery disease of LEFT coronary circulation. 5. Fissural widening of hepatic fissures and mild caudate hypertrophy raising the question of hepatic liver disease. 6. The known mass in the RIGHT kidney is not well evaluated. 7. Aortic atherosclerosis.   Aortic Atherosclerosis (ICD10-I70.0) and Emphysema (ICD10-J43.9).  He had recent  admission for presumed UTI with indwelling Foley catheter.  His catheter was changed about 5 days ago.  He has had some blood in his urine but not much.  Certainly no clot retention.  During his hospitalization, creatinine at the time of discharge was 1.42.  Hemoglobin trending downward at about 9.  He comes in today with his sister and brother-in-law who, after his eventual surgery will be caregivers.  They have multiple questions.   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 Freddie Apley note   Pneumonia    Seizure [facility])     Past Surgical History:  Procedure Laterality Date   COLONOSCOPY  05/31/2012   Procedure: COLONOSCOPY;  Surgeon: Jamesetta So, MD;  Location[facility];  Service: Gastroenterology;  Laterality: N/A;   TONSILLECTOMY      Home Medications:  Allergies as of 07/28/2022   No Known Allergies      Medication List        Accurate as of July 27, 2022  9:21 PM. If you have any questions, ask your nurse or doctor.          cefdinir 300 MG capsule Commonly known as: OMNICEF Take 1 capsule (300 mg total) by mouth 2 (two) times daily for 5 days.   multivitamin-iron-minerals-folic acid Tabs tablet Take 1 tablet by mouth daily.   tamsulosin 0.4 MG Caps capsule Commonly known as: FLOMAX Take 0.4 mg by mouth daily.  Allergies: No Known Allergies  No family history on file.  Social History:  reports that he quit smoking about 2 years ago. His smoking use included cigarettes. He has never used smokeless tobacco. He reports that he does not drink alcohol and does not use drugs.  ROS: A complete review of systems was performed.  All systems are negative except for pertinent findings as noted.  Physical Exam:  Vital signs in last 24 hours: There were no vitals taken for this visit. Constitutional:  Alert and oriented, No acute  distress Cardiovascular: Regular rate  Respiratory: Normal respiratory effort Neurologic: Grossly intact, no focal deficits Psychiatric: Normal mood and affect  I have reviewed prior pt notes  I have reviewed notes from referring/previous physicians--recent discharge summary reviewed  I have reviewed recent urine culture results  I have independently reviewed prior imaging-I shared chest CT findings with them    Impression/Assessment:  1.  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  No direct evidence of extrarenal involvement at this time  2.  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  I answered all the questions that the patient, his sister and brother-in-law had to the best of my ability  2.  I will fill out a posting sheet.  The soonest any of our [facility] physicians can perform robotic nephrectomy will be late December, perhaps the 22nd or later.  Apparently, Dr. Alyson Ingles has a date sooner than that.  I will discuss with him if he can do this procedure  3.  We will get clearance from Dr. Hilma Favors  4.  I did discuss the fact that I think it would be good for his sister and brother-in-law to attend to him for a couple of weeks postoperatively.  I also discussed the fact that with his mild renal insufficiency, he will need to be carefully followed for his renal function postoperatively

## 2022-07-28 ENCOUNTER — Ambulatory Visit (INDEPENDENT_AMBULATORY_CARE_PROVIDER_SITE_OTHER): Payer: Medicare HMO | Admitting: Urology

## 2022-07-28 VITALS — BP 128/69 | HR 139

## 2022-07-28 DIAGNOSIS — N2889 Other specified disorders of kidney and ureter: Secondary | ICD-10-CM

## 2022-07-28 DIAGNOSIS — C641 Malignant neoplasm of right kidney, except renal pelvis: Secondary | ICD-10-CM | POA: Diagnosis not present

## 2022-07-28 DIAGNOSIS — R339 Retention of urine, unspecified: Secondary | ICD-10-CM

## 2022-07-30 ENCOUNTER — Ambulatory Visit (HOSPITAL_BASED_OUTPATIENT_CLINIC_OR_DEPARTMENT_OTHER): Payer: PRIVATE HEALTH INSURANCE

## 2022-07-30 ENCOUNTER — Telehealth: Payer: Self-pay

## 2022-07-30 ENCOUNTER — Ambulatory Visit: Payer: Medicare HMO | Admitting: Physician Assistant

## 2022-07-30 NOTE — Telephone Encounter (Signed)
I spoke with Mr. Stary. We have discussed possible surgery dates and 08/13/2022 was agreed upon by all parties. Patient given information about surgery date, what to expect pre-operatively and post operatively.    We discussed that a pre-op nurse will be calling to set up the pre-op visit that will take place prior to surgery. Informed patient that our office will communicate any additional care to be provided after surgery.    Patients questions or concerns were discussed during our call. Advised to call our office should there be any additional information, questions or concerns that arise. Patient verbalized understanding.    Pre Admission Testing appointment given to patient of 08/10/2022 at 12:45 pm. Patient voiced understanding.

## 2022-08-04 ENCOUNTER — Other Ambulatory Visit: Payer: Medicare HMO | Admitting: Urology

## 2022-08-05 ENCOUNTER — Telehealth: Payer: Self-pay

## 2022-08-05 NOTE — Patient Instructions (Signed)
David [name]  08/05/2022     '@PREFPERIOPPHARMACY'$ @   Your procedure is scheduled on 08/13/2022.  Report to [facility] at 9:55 A.M.  Call this number if you have problems the morning of surgery:  [phone]  If you experience any cold or flu symptoms such as cough, fever, chills, shortness of breath, etc. between now and your scheduled surgery, please notify us at the above number.   Remember:  Do not eat or drink after midnight.      Take these medicines the morning of surgery with A SIP OF WATER : Flomax    Do not wear jewelry, make-up or nail polish.  Do not wear lotions, powders, or perfumes, or deodorant.  Do not shave 48 hours prior to surgery.  Men may shave face and neck.  Do not bring valuables to the hospital.  [facility] is not responsible for any belongings or valuables.  Contacts, dentures or bridgework may not be worn into surgery.  Leave your suitcase in the car.  After surgery it may be brought to your room.  For patients admitted to the hospital, discharge time will be determined by your treatment team.  Patients discharged the day of surgery will not be allowed to drive home.   Name and phone number of your driver:   Family Special instructions:  N/A  Please read over the following fact sheets that you were given. Care and Recovery After Surgery  Minimally Invasive Nephrectomy Minimally invasive nephrectomy is a surgical procedure to remove a kidney. This procedure is done through several small incisions in the abdomen. You may have surgery to: Remove the entire kidney and some surrounding structures (radical nephrectomy). Remove only the damaged or diseased part of the kidney (partial nephrectomy). You may need this surgery if: Your kidney is severely damaged from disease, infection, or cancer. You were born with an abnormal kidney. You are donating a healthy kidney. Tell a health care provider about: Any allergies you have. All medicines  you are taking, including vitamins, herbs, eye drops, creams, and over-the-counter medicines. Any problems you or family members have had with anesthetic medicines. Any blood disorders you have. Any surgeries you have had. Any medical conditions you have. Whether you are pregnant or may be pregnant. What are the risks? Generally, this is a safe procedure. However, problems may occur, including: Bleeding. Infection. Damage to other body structures near the kidney. Urine leaking into the abdomen. Pneumonia. A blood clot that forms in the leg and travels to the lung (pulmonary embolism). Allergic reactions to medicines. A bone (usually part of a rib) or more tissue may need to be removed so the surgeon can get to the kidney. If this happens, the minimally invasive procedure may need to be changed to an open procedure. What happens before the procedure? Staying hydrated Follow instructions from your health care provider about hydration, which may include: Up to 2 hours before the procedure - you may continue to drink clear liquids, such as water, clear fruit juice, black coffee, and plain tea.  Eating and drinking restrictions Follow instructions from your health care provider about eating and drinking, which may include: 8 hours before the procedure - stop eating heavy meals or foods, such as meat, fried foods, or fatty foods. 6 hours before the procedure - stop eating light meals or foods, such as toast or cereal. 6 hours before the procedure - stop drinking milk or drinks that contain milk. 2 hours before the procedure -  stop drinking clear liquids. Medicines Ask your health care provider about: Changing or stopping your regular medicines. This is especially important if you are taking diabetes medicines or blood thinners. Taking medicines such as aspirin and ibuprofen. These medicines can thin your blood. Do not take these medicines unless your health care provider tells you to take  them. Taking over-the-counter medicines, vitamins, herbs, and supplements. Surgery safety Ask your health care provider: How your surgery site will be marked. What steps will be taken to help prevent infection. These steps may include: Removing hair at the surgery site. Washing skin with a germ-killing soap. Taking antibiotic medicine. General instructions Do not use any products that contain nicotine or tobacco for at least 4 weeks before the procedure. These products include cigarettes, e-cigarettes, and chewing tobacco. If you need help quitting, ask your health care provider. Your health care provider may instruct you to do breathing exercises before the procedure to help prevent pneumonia. Do these exercises as directed. You may need to have your blood drawn (type and cross) in case you need to receive blood (get a transfusion) during the procedure. Plan to have a responsible adult take you home from the hospital or clinic. Plan to have a responsible adult care for you for the time you are told after you leave the hospital or clinic. This is important. What happens during the procedure?     Before surgery begins An IV will be inserted into one of your veins. You will be given a medicine to make you fall asleep (general anesthetic). Your health care provider may take steps to help blood flow in your legs. You may have: Tight stockings, also called compression stockings, on your legs. Compression sleeves wrapped around your legs. A machine called a sequential compression device (SCD) will pump air into the sleeves. A small, thin tube will be placed in your bladder (urinary catheter) to drain urine. A tube will be placed through your nose and into your stomach (nasogastric tube) to drain stomach fluids. During surgery A small incision will be made in your abdomen to insert a thin, lighted tube (laparoscope) with a camera attached. This lets your surgeon see your kidney during the  procedure. More small incisions will be made to insert other surgical tools. One incision may be slightly larger to remove your kidney. In some cases, the surgeon will do the procedure by controlling tools that are attached to a surgical robot. This is called a robot-assisted nephrectomy. The next steps depend on the type of procedure you are having. If you are having a partial nephrectomy: The blood vessels attached to your kidney will be clamped. Part of your kidney will be removed. The kidney will be closed with stitches (sutures), and the clamp on the blood vessels will be removed. If you are having a radical nephrectomy: All of the blood vessels that attach to your kidney will be closed and cut. Part of the tube that carries urine from your kidney to your bladder (ureter) will be removed. Your kidney will be removed. All of the surgical tools will be removed from your body. A small tube (drain) may be placed near one of the incisions to drain extra fluid from the surgery area. The incisions may be closed with sutures or another type of closure. A bandage (dressing) will be placed over the incision area. The procedure may vary among health care providers and hospitals. What happens after the procedure? Your blood pressure, heart rate, breathing rate, and blood  oxygen level will be monitored until you leave the hospital or clinic. Your nasogastric tube will be removed. You may continue to have: An IV until you can drink fluids on your own. A urinary catheter. Your urine output will be checked. A drain. Your surgical drain will be removed after a few days. Your health care provider will tell you how to care for the drain at home. Pain medicine. This will be given through your IV. Compression stockings or compression sleeves.This helps to prevent blood clots and reduce swelling in your legs. You will be encouraged to: Get out of bed and walk as soon as possible. Do breathing exercises,  such as coughing and breathing deeply. This helps prevent pneumonia. Summary Minimally invasive nephrectomy is a surgical procedure to remove a kidney. In some cases, only the damaged or diseased part of the kidney is removed. This procedure is done through several small incisions in the abdomen. Follow instructions from your health care provider about taking medicines and about eating and drinking before the procedure. You will be given a medicine to make you fall asleep (general anesthetic) during the procedure. This information is not intended to replace advice given to you by your health care provider. Make sure you discuss any questions you have with your health care provider. Document Revised: 12/25/2019 Document Reviewed: 12/25/2019 Elsevier Patient Education  [place] Anesthesia, Adult General anesthesia is the use of medicine to make you fall asleep (unconscious) for a medical procedure. General anesthesia must be used for certain procedures. It is often recommended for surgery or procedures that: Last a long time. Require you to be still or in an unusual position. Are major and can cause blood loss. Affect your breathing. The medicines used for general anesthesia are called general anesthetics. During general anesthesia, these medicines are given along with medicines that: Prevent pain. Control your blood pressure. Relax your muscles. Prevent nausea and vomiting after the procedure. Tell a health care provider about: Any allergies you have. All medicines you are taking, including vitamins, herbs, eye drops, creams, and over-the-counter medicines. Your history of any: Medical conditions you have, including: High blood pressure. Bleeding problems. Diabetes. Heart or lung conditions, such as: Heart failure. Sleep apnea. Asthma. Chronic obstructive pulmonary disease (COPD). Current or recent illnesses, such as: Upper respiratory, chest, or ear  infections. Cough or fever. Tobacco or drug use, including marijuana or alcohol use. Depression or anxiety. Surgeries and types of anesthetics you have had. Problems you or family members have had with anesthetic medicines. Whether you are pregnant or may be pregnant. Whether you have any chipped or loose teeth, dentures, caps, bridgework, or issues with your mouth, swallowing, or choking. What are the risks? Your health care provider will talk with you about risks. These may include: Allergic reaction to the medicines. Lung and heart problems. Inhaling food or liquid from the stomach into the lungs (aspiration). Nerve injury. Injury to the lips, mouth, teeth, or gums. Stroke. Waking up during your procedure and being unable to move. This is rare. These problems are more likely to develop if you are having a major surgery or if you have an advanced or serious medical condition. You can prevent some of these complications by answering all of your health care provider's questions thoroughly and by following all instructions before your procedure. General anesthesia can cause side effects, including: Nausea or vomiting. A sore throat or hoarseness from the breathing tube. Wheezing or coughing. Shaking chills or feeling cold.  Body aches. Sleepiness. Confusion, agitation (delirium), or anxiety. What happens before the procedure? When to stop eating and drinking Follow instructions from your health care provider about what you may eat and drink before your procedure. If you do not follow your health care provider's instructions, your procedure may be delayed or canceled. Medicines Ask your health care provider about: Changing or stopping your regular medicines. These include any diabetes medicines or blood thinners you take. Taking medicines such as aspirin and ibuprofen. These medicines can thin your blood. Do not take them unless your health care provider tells you to. Taking  over-the-counter medicines, vitamins, herbs, and supplements. General instructions Do not use any products that contain nicotine or tobacco for at least 4 weeks before the procedure. These products include cigarettes, chewing tobacco, and vaping devices, such as e-cigarettes. If you need help quitting, ask your health care provider. If you brush your teeth on the morning of the procedure, make sure to spit out all of the water and toothpaste. If told by your health care provider, bring your sleep apnea device with you to surgery (if applicable). If you will be going home right after the procedure, plan to have a responsible adult: Take you home from the hospital or clinic. You will not be allowed to drive. Care for you for the time you are told. What happens during the procedure?  An IV will be inserted into one of your veins. You will be given one or more of the following through a face mask or IV: A sedative. This helps you relax. Anesthesia. This will: Numb certain areas of your body. Make you fall asleep for surgery. After you are unconscious, a breathing tube may be inserted down your throat to help you breathe. This will be removed before you wake up. An anesthesia provider, such as an anesthesiologist, will stay with you throughout your procedure. The anesthesia provider will: Keep you comfortable and safe by continuing to give you medicines and adjusting the amount of medicine that you get. Monitor your blood pressure, heart rate, and oxygen levels to make sure that the anesthetics do not cause any problems. The procedure may vary among health care providers and hospitals. What happens after the procedure? Your blood pressure, temperature, heart rate, breathing rate, and blood oxygen level will be monitored until you leave the hospital or clinic. You will wake up in a recovery area. You may wake up slowly. You may be given medicine to help you with pain, nausea, or any other side  effects from the anesthesia. Summary General anesthesia is the use of medicine to make you fall asleep (unconscious) for a medical procedure. Follow your health care provider's instructions about when to stop eating, drinking, or taking certain medicines before your procedure. Plan to have a responsible adult take you home from the hospital or clinic. This information is not intended to replace advice given to you by your health care provider. Make sure you discuss any questions you have with your health care provider. Document Revised: 11/27/2021 Document Reviewed: 11/27/2021 Elsevier Patient Education  [place].  How to Use Chlorhexidine Before Surgery Chlorhexidine gluconate (CHG) is a germ-killing (antiseptic) solution that is used to clean the skin. It can get rid of the bacteria that normally live on the skin and can keep them away for about 24 hours. To clean your skin with CHG, you may be given: A CHG solution to use in the shower or as part of a sponge bath. A  prepackaged cloth that contains CHG. Cleaning your skin with CHG may help lower the risk for infection: While you are staying in the intensive care unit of the hospital. If you have a vascular access, such as a central line, to provide short-term or long-term access to your veins. If you have a catheter to drain urine from your bladder. If you are on a ventilator. A ventilator is a machine that helps you breathe by moving air in and out of your lungs. After surgery. What are the risks? Risks of using CHG include: A skin reaction. Hearing loss, if CHG gets in your ears and you have a perforated eardrum. Eye injury, if CHG gets in your eyes and is not rinsed out. The CHG product catching fire. Make sure that you avoid smoking and flames after applying CHG to your skin. Do not use CHG: If you have a chlorhexidine allergy or have previously reacted to chlorhexidine. On babies younger than 39 months of age. How to use  CHG solution Use CHG only as told by your health care provider, and follow the instructions on the label. Use the full amount of CHG as directed. Usually, this is one bottle. During a shower Follow these steps when using CHG solution during a shower (unless your health care provider gives you different instructions): Start the shower. Use your normal soap and shampoo to wash your face and hair. Turn off the shower or move out of the shower stream. Pour the CHG onto a clean washcloth. Do not use any type of brush or rough-edged sponge. Starting at your neck, lather your body down to your toes. Make sure you follow these instructions: If you will be having surgery, pay special attention to the part of your body where you will be having surgery. Scrub this area for at least 1 minute. Do not use CHG on your head or face. If the solution gets into your ears or eyes, rinse them well with water. Avoid your genital area. Avoid any areas of skin that have broken skin, cuts, or scrapes. Scrub your back and under your arms. Make sure to wash skin folds. Let the lather sit on your skin for 1-2 minutes or as long as told by your health care provider. Thoroughly rinse your entire body in the shower. Make sure that all body creases and crevices are rinsed well. Dry off with a clean towel. Do not put any substances on your body afterward--such as powder, lotion, or perfume--unless you are told to do so by your health care provider. Only use lotions that are recommended by the manufacturer. Put on clean clothes or pajamas. If it is the night before your surgery, sleep in clean sheets.  During a sponge bath Follow these steps when using CHG solution during a sponge bath (unless your health care provider gives you different instructions): Use your normal soap and shampoo to wash your face and hair. Pour the CHG onto a clean washcloth. Starting at your neck, lather your body down to your toes. Make sure you  follow these instructions: If you will be having surgery, pay special attention to the part of your body where you will be having surgery. Scrub this area for at least 1 minute. Do not use CHG on your head or face. If the solution gets into your ears or eyes, rinse them well with water. Avoid your genital area. Avoid any areas of skin that have broken skin, cuts, or scrapes. Scrub your back and under your  arms. Make sure to wash skin folds. Let the lather sit on your skin for 1-2 minutes or as long as told by your health care provider. Using a different clean, wet washcloth, thoroughly rinse your entire body. Make sure that all body creases and crevices are rinsed well. Dry off with a clean towel. Do not put any substances on your body afterward--such as powder, lotion, or perfume--unless you are told to do so by your health care provider. Only use lotions that are recommended by the manufacturer. Put on clean clothes or pajamas. If it is the night before your surgery, sleep in clean sheets. How to use CHG prepackaged cloths Only use CHG cloths as told by your health care provider, and follow the instructions on the label. Use the CHG cloth on clean, dry skin. Do not use the CHG cloth on your head or face unless your health care provider tells you to. When washing with the CHG cloth: Avoid your genital area. Avoid any areas of skin that have broken skin, cuts, or scrapes. Before surgery Follow these steps when using a CHG cloth to clean before surgery (unless your health care provider gives you different instructions): Using the CHG cloth, vigorously scrub the part of your body where you will be having surgery. Scrub using a back-and-forth motion for 3 minutes. The area on your body should be completely wet with CHG when you are done scrubbing. Do not rinse. Discard the cloth and let the area air-dry. Do not put any substances on the area afterward, such as powder, lotion, or perfume. Put on  clean clothes or pajamas. If it is the night before your surgery, sleep in clean sheets.  For general bathing Follow these steps when using CHG cloths for general bathing (unless your health care provider gives you different instructions). Use a separate CHG cloth for each area of your body. Make sure you wash between any folds of skin and between your fingers and toes. Wash your body in the following order, switching to a new cloth after each step: The front of your neck, shoulders, and chest. Both of your arms, under your arms, and your hands. Your stomach and groin area, avoiding the genitals. Your right leg and foot. Your left leg and foot. The back of your neck, your back, and your buttocks. Do not rinse. Discard the cloth and let the area air-dry. Do not put any substances on your body afterward--such as powder, lotion, or perfume--unless you are told to do so by your health care provider. Only use lotions that are recommended by the manufacturer. Put on clean clothes or pajamas. Contact a health care provider if: Your skin gets irritated after scrubbing. You have questions about using your solution or cloth. You swallow any chlorhexidine. Call your local poison control center (1-413-571-4303 in the [place] Get help right away if: Your eyes itch badly, or they become very red or swollen. Your skin itches badly and is red or swollen. Your hearing changes. You have trouble seeing. You have swelling or tingling in your mouth or throat. You have trouble breathing. These symptoms may represent a serious problem that is an emergency. Do not wait to see if the symptoms will go away. Get medical help right away. Call your local emergency services (911 in the [place] Do not drive yourself to the hospital. Summary Chlorhexidine gluconate (CHG) is a germ-killing (antiseptic) solution that is used to clean the skin. Cleaning your skin with CHG may help to lower your  risk for infection. You may be  given CHG to use for bathing. It may be in a bottle or in a prepackaged cloth to use on your skin. Carefully follow your health care provider's instructions and the instructions on the product label. Do not use CHG if you have a chlorhexidine allergy. Contact your health care provider if your skin gets irritated after scrubbing. This information is not intended to replace advice given to you by your health care provider. Make sure you discuss any questions you have with your health care provider. Document Revised: 12/29/2021 Document Reviewed: 11/11/2020 Elsevier Patient Education  [place].

## 2022-08-10 ENCOUNTER — Encounter (HOSPITAL_COMMUNITY): Payer: Self-pay

## 2022-08-10 ENCOUNTER — Encounter (HOSPITAL_COMMUNITY)
Admission: RE | Admit: 2022-08-10 | Discharge: 2022-08-10 | Disposition: A | Discharge status: Home / Self Care | Payer: Medicare HMO | Source: Ambulatory Visit | Attending: Urology | Admitting: Urology

## 2022-08-10 ENCOUNTER — Other Ambulatory Visit: Payer: Self-pay

## 2022-08-10 VITALS — BP 150/65 | HR 55 | Temp 98.1°F | Resp 18 | Ht 71.0 in | Wt 256.4 lb

## 2022-08-10 DIAGNOSIS — N2889 Other specified disorders of kidney and ureter: Secondary | ICD-10-CM

## 2022-08-10 DIAGNOSIS — Z01812 Encounter for preprocedural laboratory examination: Secondary | ICD-10-CM | POA: Insufficient documentation

## 2022-08-10 DIAGNOSIS — C642 Malignant neoplasm of left kidney, except renal pelvis: Secondary | ICD-10-CM | POA: Diagnosis present

## 2022-08-10 DIAGNOSIS — Z8701 Personal history of pneumonia (recurrent): Secondary | ICD-10-CM | POA: Diagnosis not present

## 2022-08-10 DIAGNOSIS — I9581 Postprocedural hypotension: Secondary | ICD-10-CM | POA: Diagnosis not present

## 2022-08-10 DIAGNOSIS — Z8619 Personal history of other infectious and parasitic diseases: Secondary | ICD-10-CM | POA: Diagnosis not present

## 2022-08-10 DIAGNOSIS — D649 Anemia, unspecified: Secondary | ICD-10-CM

## 2022-08-10 DIAGNOSIS — N401 Enlarged prostate with lower urinary tract symptoms: Secondary | ICD-10-CM | POA: Insufficient documentation

## 2022-08-10 DIAGNOSIS — Z87891 Personal history of nicotine dependence: Secondary | ICD-10-CM | POA: Diagnosis not present

## 2022-08-10 DIAGNOSIS — Z87898 Personal history of other specified conditions: Secondary | ICD-10-CM | POA: Diagnosis not present

## 2022-08-10 DIAGNOSIS — Z87440 Personal history of urinary (tract) infections: Secondary | ICD-10-CM | POA: Diagnosis not present

## 2022-08-10 DIAGNOSIS — J189 Pneumonia, unspecified organism: Secondary | ICD-10-CM | POA: Diagnosis not present

## 2022-08-10 DIAGNOSIS — N119 Chronic tubulo-interstitial nephritis, unspecified: Secondary | ICD-10-CM | POA: Diagnosis not present

## 2022-08-10 DIAGNOSIS — G4730 Sleep apnea, unspecified: Secondary | ICD-10-CM | POA: Diagnosis not present

## 2022-08-10 DIAGNOSIS — K660 Peritoneal adhesions (postprocedural) (postinfection): Secondary | ICD-10-CM | POA: Diagnosis present

## 2022-08-10 DIAGNOSIS — F17210 Nicotine dependence, cigarettes, uncomplicated: Secondary | ICD-10-CM | POA: Diagnosis not present

## 2022-08-10 HISTORY — DX: Chronic kidney disease, unspecified: N18.9

## 2022-08-10 LAB — CBC
HCT: 30.5 % — ABNORMAL LOW (ref 39.0–52.0)
Hemoglobin: 9.9 g/dL — ABNORMAL LOW (ref 13.0–17.0)
MCH: 32.8 pg (ref 26.0–34.0)
MCHC: 32.5 g/dL (ref 30.0–36.0)
MCV: 101 fL — ABNORMAL HIGH (ref 80.0–100.0)
Platelets: 170 10*3/uL (ref 150–400)
RBC: 3.02 MIL/uL — ABNORMAL LOW (ref 4.22–5.81)
RDW: 20.3 % — ABNORMAL HIGH (ref 11.5–15.5)
WBC: 5.3 10*3/uL (ref 4.0–10.5)
nRBC: 0 % (ref 0.0–0.2)

## 2022-08-10 LAB — BASIC METABOLIC PANEL
Anion gap: 8 (ref 5–15)
BUN: 15 mg/dL (ref 8–23)
CO2: 24 mmol/L (ref 22–32)
Calcium: 9.1 mg/dL (ref 8.9–10.3)
Chloride: 106 mmol/L (ref 98–111)
Creatinine, Ser: 1.05 mg/dL (ref 0.61–1.24)
GFR, Estimated: >60 mL/min (ref >60)
Glucose, Bld: 113 mg/dL — ABNORMAL HIGH (ref 70–99)
Potassium: 3.9 mmol/L (ref 3.5–5.1)
Sodium: 138 mmol/L (ref 135–145)

## 2022-08-11 NOTE — Pre-Procedure Instructions (Signed)
H & H reviewed by Dr Charna Elizabeth and order entered and lab called for cross match fir 2 units.

## 2022-08-13 ENCOUNTER — Encounter (HOSPITAL_COMMUNITY)
Admission: RE | Disposition: A | Discharge status: Home / Self Care | Payer: Self-pay | Source: Home / Self Care | Attending: Urology

## 2022-08-13 ENCOUNTER — Inpatient Hospital Stay (HOSPITAL_COMMUNITY): Payer: Medicare HMO | Admitting: Anesthesiology

## 2022-08-13 ENCOUNTER — Other Ambulatory Visit: Payer: Self-pay

## 2022-08-13 ENCOUNTER — Encounter (HOSPITAL_COMMUNITY): Payer: Self-pay | Admitting: Urology

## 2022-08-13 ENCOUNTER — Inpatient Hospital Stay (HOSPITAL_COMMUNITY)
Admission: RE | Admit: 2022-08-13 | Discharge: 2022-08-16 | DRG: 658 | Disposition: A | Discharge status: Home / Self Care | Payer: Medicare HMO | Attending: Urology | Admitting: Urology

## 2022-08-13 DIAGNOSIS — Z87898 Personal history of other specified conditions: Secondary | ICD-10-CM | POA: Diagnosis not present

## 2022-08-13 DIAGNOSIS — J189 Pneumonia, unspecified organism: Secondary | ICD-10-CM

## 2022-08-13 DIAGNOSIS — I9581 Postprocedural hypotension: Secondary | ICD-10-CM | POA: Diagnosis not present

## 2022-08-13 DIAGNOSIS — G4730 Sleep apnea, unspecified: Secondary | ICD-10-CM

## 2022-08-13 DIAGNOSIS — Z87891 Personal history of nicotine dependence: Secondary | ICD-10-CM | POA: Diagnosis not present

## 2022-08-13 DIAGNOSIS — Z87440 Personal history of urinary (tract) infections: Secondary | ICD-10-CM | POA: Diagnosis not present

## 2022-08-13 DIAGNOSIS — F17210 Nicotine dependence, cigarettes, uncomplicated: Secondary | ICD-10-CM

## 2022-08-13 DIAGNOSIS — N2889 Other specified disorders of kidney and ureter: Secondary | ICD-10-CM

## 2022-08-13 DIAGNOSIS — Z8619 Personal history of other infectious and parasitic diseases: Secondary | ICD-10-CM | POA: Diagnosis not present

## 2022-08-13 DIAGNOSIS — C642 Malignant neoplasm of left kidney, except renal pelvis: Principal | ICD-10-CM | POA: Diagnosis present

## 2022-08-13 DIAGNOSIS — Z8701 Personal history of pneumonia (recurrent): Secondary | ICD-10-CM | POA: Diagnosis not present

## 2022-08-13 DIAGNOSIS — K660 Peritoneal adhesions (postprocedural) (postinfection): Secondary | ICD-10-CM

## 2022-08-13 DIAGNOSIS — D649 Anemia, unspecified: Secondary | ICD-10-CM

## 2022-08-13 HISTORY — PX: ROBOT ASSISTED LAPAROSCOPIC NEPHRECTOMY: SHX5140

## 2022-08-13 LAB — BASIC METABOLIC PANEL
Anion gap: 8 (ref 5–15)
BUN: 14 mg/dL (ref 8–23)
CO2: 19 mmol/L — ABNORMAL LOW (ref 22–32)
Calcium: 7.6 mg/dL — ABNORMAL LOW (ref 8.9–10.3)
Chloride: 109 mmol/L (ref 98–111)
Creatinine, Ser: 1.07 mg/dL (ref 0.61–1.24)
GFR, Estimated: >60 mL/min (ref >60)
Glucose, Bld: 200 mg/dL — ABNORMAL HIGH (ref 70–99)
Potassium: 4 mmol/L (ref 3.5–5.1)
Sodium: 136 mmol/L (ref 135–145)

## 2022-08-13 LAB — CBC
HCT: 23.9 % — ABNORMAL LOW (ref 39.0–52.0)
Hemoglobin: 7.6 g/dL — ABNORMAL LOW (ref 13.0–17.0)
MCH: 32.1 pg (ref 26.0–34.0)
MCHC: 31.8 g/dL (ref 30.0–36.0)
MCV: 100.8 fL — ABNORMAL HIGH (ref 80.0–100.0)
Platelets: 145 10*3/uL — ABNORMAL LOW (ref 150–400)
RBC: 2.37 MIL/uL — ABNORMAL LOW (ref 4.22–5.81)
RDW: 20.5 % — ABNORMAL HIGH (ref 11.5–15.5)
WBC: 4 10*3/uL (ref 4.0–10.5)
nRBC: 0 % (ref 0.0–0.2)

## 2022-08-13 LAB — PREPARE RBC (CROSSMATCH)

## 2022-08-13 LAB — ABO/RH: ABO/RH(D): O POS

## 2022-08-13 SURGERY — NEPHRECTOMY, RADICAL, ROBOT-ASSISTED, LAPAROSCOPIC, ADULT
Anesthesia: General | Site: Flank | Laterality: Left

## 2022-08-13 MED ORDER — ROCURONIUM BROMIDE 10 MG/ML (PF) SYRINGE
PREFILLED_SYRINGE | INTRAVENOUS | Status: DC | PRN
  Administered 2022-08-13 (×2): 20 mg via INTRAVENOUS
  Administered 2022-08-13: 80 mg via INTRAVENOUS

## 2022-08-13 MED ORDER — ORAL CARE MOUTH RINSE: 15.0000 mL | OROMUCOSAL | Status: DC | PRN

## 2022-08-13 MED ORDER — DEXAMETHASONE SODIUM PHOSPHATE 10 MG/ML IJ SOLN
INTRAMUSCULAR | Status: AC
  Filled 2022-08-13: qty 3

## 2022-08-13 MED ORDER — LACTATED RINGERS IV SOLN: INTRAVENOUS | Status: DC

## 2022-08-13 MED ORDER — PHENYLEPHRINE HCL (PRESSORS) 10 MG/ML IV SOLN
INTRAVENOUS | Status: DC | PRN
  Administered 2022-08-13: 160 ug via INTRAVENOUS

## 2022-08-13 MED ORDER — OXYCODONE HCL 5 MG PO TABS
5.0000 mg | ORAL_TABLET | ORAL | Status: DC | PRN
  Administered 2022-08-13 – 2022-08-14 (×2): 5 mg via ORAL
  Filled 2022-08-13 (×3): qty 1

## 2022-08-13 MED ORDER — ONDANSETRON HCL 4 MG/2ML IJ SOLN: 4.0000 mg | Freq: Once | INTRAMUSCULAR | Status: DC | PRN

## 2022-08-13 MED ORDER — PROPOFOL 10 MG/ML IV BOLUS
INTRAVENOUS | Status: DC | PRN
  Administered 2022-08-13: 150 mg via INTRAVENOUS
  Administered 2022-08-13: 50 mg via INTRAVENOUS

## 2022-08-13 MED ORDER — ONDANSETRON HCL 4 MG/2ML IJ SOLN
INTRAMUSCULAR | Status: AC
  Filled 2022-08-13: qty 8

## 2022-08-13 MED ORDER — CHLORHEXIDINE GLUCONATE 0.12 % MT SOLN: 15.0000 mL | Freq: Once | OROMUCOSAL | Status: DC

## 2022-08-13 MED ORDER — HYDROMORPHONE HCL 1 MG/ML IJ SOLN
0.2500 mg | INTRAMUSCULAR | Status: DC | PRN
  Administered 2022-08-13: 0.5 mg via INTRAVENOUS
  Filled 2022-08-13: qty 0.5

## 2022-08-13 MED ORDER — ESMOLOL HCL 100 MG/10ML IV SOLN
INTRAVENOUS | Status: DC | PRN
  Administered 2022-08-13: 40 mg via INTRAVENOUS

## 2022-08-13 MED ORDER — ONDANSETRON HCL 4 MG/2ML IJ SOLN: 4.0000 mg | INTRAMUSCULAR | Status: DC | PRN

## 2022-08-13 MED ORDER — ALBUMIN HUMAN 5 % IV SOLN
INTRAVENOUS | Status: AC
  Filled 2022-08-13: qty 250

## 2022-08-13 MED ORDER — DOCUSATE SODIUM 100 MG PO CAPS
100.0000 mg | ORAL_CAPSULE | Freq: Two times a day (BID) | ORAL | Status: DC
  Administered 2022-08-13 – 2022-08-15 (×5): 100 mg via ORAL
  Filled 2022-08-13 (×6): qty 1

## 2022-08-13 MED ORDER — BUPIVACAINE LIPOSOME 1.3 % IJ SUSP
INTRAMUSCULAR | Status: AC
  Filled 2022-08-13: qty 20

## 2022-08-13 MED ORDER — ALBUMIN HUMAN 5 % IV SOLN: INTRAVENOUS | Status: DC | PRN

## 2022-08-13 MED ORDER — HYDROMORPHONE HCL 1 MG/ML IJ SOLN
0.5000 mg | INTRAMUSCULAR | Status: DC | PRN
  Administered 2022-08-13: 0.5 mg via INTRAVENOUS
  Filled 2022-08-13: qty 1

## 2022-08-13 MED ORDER — SUGAMMADEX SODIUM 500 MG/5ML IV SOLN
INTRAVENOUS | Status: AC
  Filled 2022-08-13: qty 5

## 2022-08-13 MED ORDER — CEFAZOLIN SODIUM-DEXTROSE 2-4 GM/100ML-% IV SOLN
INTRAVENOUS | Status: AC
  Filled 2022-08-13: qty 100

## 2022-08-13 MED ORDER — LIDOCAINE HCL (PF) 2 % IJ SOLN
INTRAMUSCULAR | Status: AC
  Filled 2022-08-13: qty 15

## 2022-08-13 MED ORDER — BUPIVACAINE LIPOSOME 1.3 % IJ SUSP
INTRAMUSCULAR | Status: DC | PRN
  Administered 2022-08-13: 20 mL

## 2022-08-13 MED ORDER — ORAL CARE MOUTH RINSE: 15.0000 mL | Freq: Once | OROMUCOSAL | Status: DC

## 2022-08-13 MED ORDER — CEFAZOLIN SODIUM-DEXTROSE 2-4 GM/100ML-% IV SOLN
2.0000 g | INTRAVENOUS | Status: AC
  Administered 2022-08-13: 2 g via INTRAVENOUS

## 2022-08-13 MED ORDER — PROPOFOL 10 MG/ML IV BOLUS
INTRAVENOUS | Status: AC
  Filled 2022-08-13: qty 20

## 2022-08-13 MED ORDER — STERILE WATER FOR IRRIGATION IR SOLN
Status: DC | PRN
  Administered 2022-08-13: 500 mL
  Administered 2022-08-13: 3000 mL

## 2022-08-13 MED ORDER — SENNOSIDES-DOCUSATE SODIUM 8.6-50 MG PO TABS
2.0000 | ORAL_TABLET | Freq: Every day | ORAL | Status: DC
  Administered 2022-08-13 – 2022-08-15 (×3): 2 via ORAL
  Filled 2022-08-13 (×3): qty 2

## 2022-08-13 MED ORDER — DIPHENHYDRAMINE HCL 50 MG/ML IJ SOLN: 12.5000 mg | Freq: Four times a day (QID) | INTRAMUSCULAR | Status: DC | PRN

## 2022-08-13 MED ORDER — ONDANSETRON HCL 4 MG/2ML IJ SOLN
INTRAMUSCULAR | Status: DC | PRN
  Administered 2022-08-13: 4 mg via INTRAVENOUS

## 2022-08-13 MED ORDER — EPHEDRINE SULFATE (PRESSORS) 50 MG/ML IJ SOLN
INTRAMUSCULAR | Status: DC | PRN
  Administered 2022-08-13 (×2): 10 mg via INTRAVENOUS

## 2022-08-13 MED ORDER — CHLORHEXIDINE GLUCONATE CLOTH 2 % EX PADS
6.0000 | MEDICATED_PAD | Freq: Every day | CUTANEOUS | Status: DC
  Administered 2022-08-13 – 2022-08-16 (×4): 6 via TOPICAL

## 2022-08-13 MED ORDER — PHENYLEPHRINE HCL-NACL 20-0.9 MG/250ML-% IV SOLN
INTRAVENOUS | Status: DC | PRN
  Administered 2022-08-13: 20 ug/min via INTRAVENOUS

## 2022-08-13 MED ORDER — ROCURONIUM BROMIDE 10 MG/ML (PF) SYRINGE
PREFILLED_SYRINGE | INTRAVENOUS | Status: AC
  Filled 2022-08-13: qty 30

## 2022-08-13 MED ORDER — SODIUM CHLORIDE 0.9 % IV SOLN: INTRAVENOUS | Status: DC

## 2022-08-13 MED ORDER — PROPOFOL 500 MG/50ML IV EMUL
INTRAVENOUS | Status: DC | PRN
  Administered 2022-08-13: 50 ug/kg/min via INTRAVENOUS

## 2022-08-13 MED ORDER — ACETAMINOPHEN 325 MG PO TABS: 650.0000 mg | ORAL_TABLET | ORAL | Status: DC | PRN

## 2022-08-13 MED ORDER — SUGAMMADEX SODIUM 200 MG/2ML IV SOLN
INTRAVENOUS | Status: DC | PRN
  Administered 2022-08-13: 465.2 mg via INTRAVENOUS

## 2022-08-13 MED ORDER — TAMSULOSIN HCL 0.4 MG PO CAPS
0.4000 mg | ORAL_CAPSULE | Freq: Every day | ORAL | Status: DC
  Administered 2022-08-14 – 2022-08-16 (×3): 0.4 mg via ORAL
  Filled 2022-08-13 (×3): qty 1

## 2022-08-13 MED ORDER — FENTANYL CITRATE (PF) 100 MCG/2ML IJ SOLN
INTRAMUSCULAR | Status: DC | PRN
  Administered 2022-08-13: 50 ug via INTRAVENOUS
  Administered 2022-08-13 (×2): 100 ug via INTRAVENOUS

## 2022-08-13 MED ORDER — FENTANYL CITRATE (PF) 250 MCG/5ML IJ SOLN
INTRAMUSCULAR | Status: AC
  Filled 2022-08-13: qty 5

## 2022-08-13 MED ORDER — DIPHENHYDRAMINE HCL 12.5 MG/5ML PO ELIX: 12.5000 mg | ORAL_SOLUTION | Freq: Four times a day (QID) | ORAL | Status: DC | PRN

## 2022-08-13 MED ORDER — LIDOCAINE HCL (PF) 1 % IJ SOLN
INTRAMUSCULAR | Status: AC
  Filled 2022-08-13: qty 2

## 2022-08-13 MED ORDER — SUCCINYLCHOLINE CHLORIDE 200 MG/10ML IV SOSY
PREFILLED_SYRINGE | INTRAVENOUS | Status: AC
  Filled 2022-08-13: qty 10

## 2022-08-13 MED ORDER — ZOLPIDEM TARTRATE 5 MG PO TABS: 5.0000 mg | ORAL_TABLET | Freq: Every evening | ORAL | Status: DC | PRN

## 2022-08-13 MED ORDER — EPHEDRINE 5 MG/ML INJ
INTRAVENOUS | Status: AC
  Filled 2022-08-13: qty 5

## 2022-08-13 MED ORDER — DEXMEDETOMIDINE HCL IN NACL 80 MCG/20ML IV SOLN
INTRAVENOUS | Status: AC
  Filled 2022-08-13: qty 60

## 2022-08-13 MED ORDER — DEXAMETHASONE SODIUM PHOSPHATE 10 MG/ML IJ SOLN
INTRAMUSCULAR | Status: AC
  Filled 2022-08-13: qty 1

## 2022-08-13 MED ORDER — PROPOFOL 500 MG/50ML IV EMUL
INTRAVENOUS | Status: AC
  Filled 2022-08-13: qty 350

## 2022-08-13 MED ORDER — CHLORHEXIDINE GLUCONATE CLOTH 2 % EX PADS: 6.0000 | MEDICATED_PAD | Freq: Once | CUTANEOUS | Status: DC

## 2022-08-13 MED ORDER — CEFAZOLIN SODIUM-DEXTROSE 2-4 GM/100ML-% IV SOLN
2.0000 g | Freq: Three times a day (TID) | INTRAVENOUS | Status: AC
  Administered 2022-08-13 – 2022-08-14 (×2): 2 g via INTRAVENOUS
  Filled 2022-08-13 (×2): qty 100

## 2022-08-13 MED ORDER — DEXAMETHASONE SODIUM PHOSPHATE 10 MG/ML IJ SOLN
INTRAMUSCULAR | Status: DC | PRN
  Administered 2022-08-13: 10 mg via INTRAVENOUS

## 2022-08-13 MED ORDER — LIDOCAINE HCL (CARDIAC) PF 100 MG/5ML IV SOSY
PREFILLED_SYRINGE | INTRAVENOUS | Status: DC | PRN
  Administered 2022-08-13: 80 mg via INTRATRACHEAL

## 2022-08-13 SURGICAL SUPPLY — 56 items
BAG LAPAROSCOPIC 12 15 PORT 16 (BASKET) ×1 IMPLANT
BAG RETRIEVAL 12/15 (BASKET) ×1 IMPLANT
BLADE SURG 15 STRL LF DISP TIS (BLADE) ×1 IMPLANT
BLADE SURG 15 STRL SS (BLADE) ×1
BULB RESERV EVAC DRAIN JP 100C (MISCELLANEOUS) IMPLANT
CHLORAPREP W/TINT 26 (MISCELLANEOUS) ×1 IMPLANT
CLIP LIGATING HEM O LOK PURPLE (MISCELLANEOUS) ×2 IMPLANT
COVER LIGHT HANDLE STERIS (MISCELLANEOUS) ×1 IMPLANT
COVER TIP SHEARS 8 DVNC (MISCELLANEOUS) ×1 IMPLANT
COVER TIP SHEARS 8MM DA VINCI (MISCELLANEOUS) ×1
CUTTER ECHEON FLEX ENDO 45 340 (ENDOMECHANICALS) IMPLANT
CUTTER FLEX LINEAR 45M (STAPLE) IMPLANT
DERMABOND ADVANCED .7 DNX12 (GAUZE/BANDAGES/DRESSINGS) ×1 IMPLANT
DRAIN CHANNEL RND F F (WOUND CARE) IMPLANT
DRAPE ARM DVNC X/XI (DISPOSABLE) ×4 IMPLANT
DRAPE COLUMN DVNC XI (DISPOSABLE) ×1 IMPLANT
DRAPE DA VINCI XI ARM (DISPOSABLE) ×4
DRAPE DA VINCI XI COLUMN (DISPOSABLE) ×1
DRAPE HALF SHEET 40X57 (DRAPES) IMPLANT
DRAPE INCISE IOBAN 66X45 STRL (DRAPES) ×1 IMPLANT
ELECT PENCIL ROCKER SW 15FT (MISCELLANEOUS) ×1 IMPLANT
ELECT REM PT RETURN 15FT ADLT (MISCELLANEOUS) ×1 IMPLANT
GLOVE BIO SURGEON STRL SZ8 (GLOVE) ×2 IMPLANT
GLOVE BIOGEL PI IND STRL 7.0 (GLOVE) ×2 IMPLANT
GLOVE BIOGEL PI IND STRL 8 (GLOVE) ×2 IMPLANT
GLOVE ECLIPSE 6.5 STRL STRAW (GLOVE) IMPLANT
GOWN STRL REUS W/TWL LRG LVL3 (GOWN DISPOSABLE) ×2 IMPLANT
GOWN STRL REUS W/TWL XL LVL3 (GOWN DISPOSABLE) ×2 IMPLANT
IRRIG SUCT STRYKERFLOW 2 WTIP (MISCELLANEOUS) ×1 IMPLANT
IRRIGATION SUCT STRKRFLW 2 WTP (MISCELLANEOUS) IMPLANT
KIT TURNOVER KIT A (KITS) IMPLANT
NDL INSUFFLATION 14GA 120MM (NEEDLE) ×1 IMPLANT
NEEDLE HYPO 22GX1.5 SAFETY (NEEDLE) ×1 IMPLANT
NEEDLE INSUFFLATION 14GA 120MM (NEEDLE) ×1 IMPLANT
PACK LAP CHOLE LZT030E (CUSTOM PROCEDURE TRAY) ×1 IMPLANT
RELOAD 45 VASCULAR/THIN (ENDOMECHANICALS) ×3 IMPLANT
RELOAD STAPLE 45 2.5 WHT GRN (ENDOMECHANICALS) IMPLANT
RELOAD STAPLE 45 2.6 WHT THIN (STAPLE) IMPLANT
SEAL CANN UNIV 5-8 DVNC XI (MISCELLANEOUS) ×4 IMPLANT
SEAL XI 5MM-8MM UNIVERSAL (MISCELLANEOUS) ×4
SET BASIN LINEN APH (SET/KITS/TRAYS/PACK) ×1 IMPLANT
SET TUBE SMOKE EVAC HIGH FLOW (TUBING) ×1 IMPLANT
SPONGE DRAIN TRACH 4X4 STRL 2S (GAUZE/BANDAGES/DRESSINGS) IMPLANT
STAPLE RELOAD 45 WHT (STAPLE) ×2 IMPLANT
STAPLE RELOAD 45MM WHITE (STAPLE) ×2
SUT ETHILON 3 0 PS 1 (SUTURE) IMPLANT
SUT MNCRL AB 4-0 PS2 18 (SUTURE) IMPLANT
SUT PDS AB 0 CTX 60 (SUTURE) ×1 IMPLANT
SUT VIC AB 2-0 SH 27 (SUTURE) ×1
SUT VIC AB 2-0 SH 27X BRD (SUTURE) IMPLANT
SUT VIC AB 3-0 SH 27 (SUTURE) ×1
SUT VIC AB 3-0 SH 27X BRD (SUTURE) IMPLANT
SYR 20ML LL LF (SYRINGE) ×2 IMPLANT
TRAY FOLEY MTR SLVR 16FR STAT (SET/KITS/TRAYS/PACK) IMPLANT
WATER STERILE IRR 1000ML POUR (IV SOLUTION) ×1 IMPLANT
WATER STERILE IRR 3000ML UROMA (IV SOLUTION) IMPLANT

## 2022-08-13 NOTE — Anesthesia Procedure Notes (Signed)
Procedure Name: Intubation Date/Time: 08/13/2022 11:31 AM  Performed by: Minerva Ends, CRNAPre-anesthesia Checklist: Patient identified, Emergency Drugs available, Suction available and Patient being monitored Patient Re-evaluated:Patient Re-evaluated prior to induction Oxygen Delivery Method: Circle system utilized Preoxygenation: Pre-oxygenation with 100% oxygen Induction Type: IV induction Ventilation: Mask ventilation without difficulty Laryngoscope Size: Mac and 3 Grade View: Grade I Tube type: Oral Tube size: 7.0 mm Number of attempts: 1 Airway Equipment and Method: Stylet and Oral airway Placement Confirmation: ETT inserted through vocal cords under direct vision, positive ETCO2 and breath sounds checked- equal and bilateral Secured at: 23 cm Tube secured with: Tape Dental Injury: Teeth and Oropharynx as per pre-operative assessment

## 2022-08-13 NOTE — Anesthesia Procedure Notes (Signed)
Arterial Line Insertion Start/End11/30/2023 10:30 AM, 08/13/2022 10:40 AM Performed by: Myna Bright, CRNA, CRNA  Patient location: Pre-op. Preanesthetic checklist: patient identified, IV checked, risks and benefits discussed, surgical consent, monitors and equipment checked, pre-op evaluation and timeout performed Lidocaine 1% used for infiltration radial was placed Catheter size: 20 G Hand hygiene performed  and maximum sterile barriers used  Allen's test indicative of satisfactory collateral circulation Attempts: 2 Procedure performed without using ultrasound guided technique. Following insertion, Biopatch and dressing applied. Post procedure assessment: normal  Patient tolerated the procedure well with no immediate complications.

## 2022-08-13 NOTE — Anesthesia Preprocedure Evaluation (Addendum)
Anesthesia Evaluation  Patient identified by MRN, date of birth, ID band Patient awake    Reviewed: Allergy & Precautions, H&P , NPO status , Patient's Chart, lab work & pertinent test results  Airway Mallampati: II  TM Distance: >3 FB Neck ROM: Full    Dental  (+) Dental Advisory Given, Edentulous Upper, Edentulous Lower   Pulmonary sleep apnea , pneumonia, resolved, Current Smoker   Pulmonary exam normal breath sounds clear to auscultation       Cardiovascular negative cardio ROS Normal cardiovascular exam Rhythm:Regular Rate:Normal     Neuro/Psych Seizures -,   negative psych ROS   GI/Hepatic negative GI ROS,,,(+) Hepatitis -, C  Endo/Other  negative endocrine ROS    Renal/GU Renal disease (left renal mass)  negative genitourinary   Musculoskeletal negative musculoskeletal ROS (+)    Abdominal   Peds negative pediatric ROS (+)  Hematology  (+) Blood dyscrasia, anemia   Anesthesia Other Findings   Reproductive/Obstetrics negative OB ROS                             Anesthesia Physical Anesthesia Plan  ASA: 3  Anesthesia Plan: General   Post-op Pain Management: Dilaudid IV   Induction: Intravenous  PONV Risk Score and Plan: 3 and Ondansetron and Dexamethasone  Airway Management Planned: Oral ETT  Additional Equipment: Arterial line  Intra-op Plan:   Post-operative Plan: Extubation in OR  Informed Consent: I have reviewed the patients History and Physical, chart, labs and discussed the procedure including the risks, benefits and alternatives for the proposed anesthesia with the patient or authorized representative who has indicated his/her understanding and acceptance.     Dental advisory given  Plan Discussed with: CRNA and Surgeon  Anesthesia Plan Comments:         Anesthesia Quick Evaluation

## 2022-08-13 NOTE — Interval H&P Note (Signed)
History and Physical Interval Note:  08/13/2022 9:55 AM  David [name]  has presented today for surgery, with the diagnosis of left renal mass.  The various methods of treatment have been discussed with the patient and family. After consideration of risks, benefits and other options for treatment, the patient has consented to  Procedure(s): XI ROBOTIC ASSISTED LAPAROSCOPIC NEPHRECTOMY (Left) as a surgical intervention.  The patient's history has been reviewed, patient examined, no change in status, stable for surgery.  I have reviewed the patient's chart and labs.  Questions were answered to the patient's satisfaction.     Nicolette Bang

## 2022-08-13 NOTE — Plan of Care (Signed)

## 2022-08-13 NOTE — Transfer of Care (Signed)
Immediate Anesthesia Transfer of Care Note  Patient: David [name]  Procedure(s) Performed: XI ROBOTIC ASSISTED LAPAROSCOPIC NEPHRECTOMY (Left: Flank)  Patient Location: PACU  Anesthesia Type:General  Level of Consciousness: awake, alert , and oriented  Airway & Oxygen Therapy: Patient Spontanous Breathing  Post-op Assessment: Report given to RN and Post -op Vital signs reviewed and stable  Post vital signs: Reviewed and stable  Last Vitals:  Vitals Value Taken Time  BP 145/89   Temp 98   Pulse 100   Resp 16   SpO2 96     Last Pain:  Vitals:   08/13/22 1007  TempSrc: Oral  PainSc:          Complications: No notable events documented.

## 2022-08-13 NOTE — Op Note (Addendum)
Preoperative diagnosis: Left renal mass  Postop diagnosis: Same  Procedure: 1.  Left robot assisted laparoscopic radical nephrectomy 2.  Lysis of adhesions, extensive  Attending: Nicolette Bang, MD  Assistant: Aviva Signs, MD  Anesthesia: General  Estimated blood loss: 150 cc  Drains: 16 French Foley catheter, JP drain  Specimens: Left radical nephrectomy  Antibiotics: ancef  Findings: 1 renal artery, 1 renal vein. Numerous parasitic vessels. Partial adrenal removed. The assistant was utilized for suction, retraction, deploying vascular stapler and deploying endocatch bag  Indications: Patient is a 71 year old with a history of 9 cm left renal mass.  The mass was not amenable to partial nephrectomy.  After discussing treatment options patient decided to proceed with left robot assisted laparoscopic radical nephrectomy.  Procedure in detail: Prior to procedure consent was obtained. Patient was brought to the operating room and briefing was done sure correct patient, correct procedure, correct site.  General anesthesia was in administered patient was placed in the right lateral decubitus position.  a 14 French catheter was placed. their abdomen and flank was then prepped and draped usual sterile fashion.  A Veress needle was used to obtain pneumoperitoneum.  Once pneumoperitoneum was reestablished to 15 mmHg we then placed a 8 mm camera port lateral to the umbilicus at the latera; edge of rectus.  We then proceeded to place 3 more robotic ports. We then placed an assistant port. We then docked the robot.  We then started this dissection by removing a extensive amount of anterior abdominal wall adhesions.  We then dissected along the white line of T[place]t.  We then reflected the colon medially.  We then identified the psoas muscle.  Once this was done we traced it down to the iliac vessels and identified the ureter.  Once we identified the gonadal vein and ureter were then traced this to the  renal hilum.  The renal vein and renal artery were skeletonized.  We did we identified one renal vein one renal artery.  Using the [employer] power stapler within ligated the renal artery.  Once this was done we then used a second staple load to ligate the renal vein.  We then used a tissue load to ligate the gonadal vein and the ureter.  Once this was done we then freed the kidney from its lateral and posterior attachments.  We then used a Endo Catch bag to remove the specimen.  Once the specimen was in the Endo Catch bag we then inspected the retroperitoneum and noted no residual bleeding. We then placed a JP drain through the left lateral robotic port. We then removed our instruments, undocked the robot, and released the pneumoperitoneum. The JP drain was secured to the skin with a 3-0 nylon stitch. We then made a midline incision above the umbilicus to remove the specimen.  Once the specimen was removed we then closed the camera and assistant ports with 0 Vicryl in interrupted fashion.  We then closed the midline incision with looped PDS in a running fashion.  We then closed the overlying skin with 2-0 Vicryl in running fashion.  These skin was then subcuticularly closed with 4-0 Monocryl.  We then placed Dermabond over all the incisions.  The assistant was utilized for suction, retraction, deploying vascular stapler and deploying endocatch bag. This concluded the procedure which resulted by the patient.  Complications: None  Condition: Stable, x-rayed, transferred to PACU.  Plan: Patient is to be admitted for inpatient stay. The foley catheter will be removed in the  morning. They will be started on a clear liquid diet POD#1

## 2022-08-13 NOTE — Anesthesia Postprocedure Evaluation (Signed)
Anesthesia Post Note  Patient: David [name]  Procedure(s) Performed: XI ROBOTIC ASSISTED LAPAROSCOPIC NEPHRECTOMY (Left: Flank)  Patient location during evaluation: Phase II Anesthesia Type: General Level of consciousness: awake and alert and oriented Pain management: pain level controlled Vital Signs Assessment: post-procedure vital signs reviewed and stable Respiratory status: spontaneous breathing, nonlabored ventilation and respiratory function stable Cardiovascular status: blood pressure returned to baseline and stable Postop Assessment: no apparent nausea or vomiting Anesthetic complications: no  No notable events documented.   Last Vitals:  Vitals:   08/13/22 1007 08/13/22 1434  BP: 131/69 113/70  Pulse: 86 90  Resp: 20 19  Temp: 36.6 C   SpO2: 98% 95%    Last Pain:  Vitals:   08/13/22 1434  TempSrc:   PainSc: 5                  Cariah Salatino C Ryin Ambrosius

## 2022-08-14 LAB — CBC
HCT: 22 % — ABNORMAL LOW (ref 39.0–52.0)
Hemoglobin: 7.2 g/dL — ABNORMAL LOW (ref 13.0–17.0)
MCH: 32.6 pg (ref 26.0–34.0)
MCHC: 32.7 g/dL (ref 30.0–36.0)
MCV: 99.5 fL (ref 80.0–100.0)
Platelets: 179 10*3/uL (ref 150–400)
RBC: 2.21 MIL/uL — ABNORMAL LOW (ref 4.22–5.81)
RDW: 20.5 % — ABNORMAL HIGH (ref 11.5–15.5)
WBC: 6.9 10*3/uL (ref 4.0–10.5)
nRBC: 0.3 % — ABNORMAL HIGH (ref 0.0–0.2)

## 2022-08-14 LAB — BASIC METABOLIC PANEL
Anion gap: 8 (ref 5–15)
BUN: 21 mg/dL (ref 8–23)
CO2: 22 mmol/L (ref 22–32)
Calcium: 8.3 mg/dL — ABNORMAL LOW (ref 8.9–10.3)
Chloride: 105 mmol/L (ref 98–111)
Creatinine, Ser: 1.51 mg/dL — ABNORMAL HIGH (ref 0.61–1.24)
GFR, Estimated: 49 mL/min — ABNORMAL LOW (ref >60)
Glucose, Bld: 133 mg/dL — ABNORMAL HIGH (ref 70–99)
Potassium: 5.5 mmol/L — ABNORMAL HIGH (ref 3.5–5.1)
Sodium: 135 mmol/L (ref 135–145)

## 2022-08-14 LAB — PREPARE RBC (CROSSMATCH)

## 2022-08-14 LAB — MRSA NEXT GEN BY PCR, NASAL: MRSA by PCR Next Gen: NOT DETECTED

## 2022-08-14 MED ORDER — SODIUM CHLORIDE 0.9% IV SOLUTION: Freq: Once | INTRAVENOUS | Status: AC

## 2022-08-14 NOTE — Progress Notes (Addendum)
Foley cath had 381m of urine in bag and foley then dc'd per MD orders. Patient education provided on letting staff know when he urinates on own. Urinal provided. Patient expressed full understanding with teach back.  A line dc'd per MD orders with no complications. Vasoline gauze and pressure dressing and pressure applied to site with no noted active bleeding noted thus far. Report given to nurse on 300 and patient transferred from bed to wheelchair with stand-by assist. JP drain intact and charged at time of transfer. Blood consent obtained from patient and awaiting unit to be ready (made 300 nurse aware). Dr MAlyson Inglesmade aware of am lab result of potassium 5.5. Continue to monitor per verbal from Dr MAlyson Ingles

## 2022-08-14 NOTE — Care Management Important Message (Signed)
Important Message  Patient Details  Name: David [name] MRN: [id number] Date of Birth: Sep 13, 1951   Medicare Important Message Given:  Yes     Tommy Medal 08/14/2022, 4:15 PM

## 2022-08-14 NOTE — Progress Notes (Signed)
1 Day Post-Op Subjective: Patient reports good pain control. Negative flatus. Tolerating clear liquid diet. Hgb 7.2 today  Objective: Vital signs in last 24 hours: Temp:  [97.5 F (36.4 C)-97.9 F (36.6 C)] 97.7 F (36.5 C) (12/01 1539) Pulse Rate:  [77-110] 105 (12/01 1539) Resp:  [10-25] 18 (12/01 1539) BP: (86-140)/(48-82) 125/59 (12/01 1539) SpO2:  [95 %-100 %] 98 % (12/01 1539) Weight:  [119.4 kg] 119.4 kg (12/01 0550)  Intake/Output from previous day: 11/30 0701 - 12/01 0700 In: 2879.1 [I.V.:2385.9; IV Piggyback:493.2] Out: 1185 [Urine:725; Drains:310; Blood:150] Intake/Output this shift: Total I/O In: -  Out: 800 [Urine:800]  Physical Exam:  General:alert, cooperative, and appears stated age GI: soft, non tender, normal bowel sounds, no palpable masses, no organomegaly, no inguinal hernia Male genitalia: not done Extremities: extremities normal, atraumatic, no cyanosis or edema  Lab Results: Recent Labs    08/13/22 1531 08/14/22 0317  HGB 7.6* 7.2*  HCT 23.9* 22.0*   BMET Recent Labs    08/13/22 1531 08/14/22 0317  NA 136 135  K 4.0 5.5*  CL 109 105  CO2 19* 22  GLUCOSE 200* 133*  BUN 14 21  CREATININE 1.07 1.51*  CALCIUM 7.6* 8.3*   No results for input(s): "LABPT", "INR" in the last 72 hours. No results for input(s): "LABURIN" in the last 72 hours. Results for orders placed or performed during the hospital encounter of 08/13/22  MRSA Next Gen by PCR, Nasal     Status: None   Collection Time: 08/13/22  3:38 PM   Specimen: Nasal Mucosa; Nasal Swab  Result Value Ref Range Status   MRSA by PCR Next Gen NOT DETECTED NOT DETECTED Final    Comment: (NOTE) The GeneXpert MRSA Assay (FDA approved for NASAL specimens only), is one component of a comprehensive MRSA colonization surveillance program. It is not intended to diagnose MRSA infection nor to guide or monitor treatment for MRSA infections. Test performance is not FDA approved in patients less  than 53 years old. Performed at [facility], [address], [place], [place] [address]     Studies/Results: No results found.  Assessment/Plan: POD#1 left radical nephrectomy Transfer to floor D/c foley Transfuse 1 unit PRNCs Ambulate in halls with assistance Advance diet to regular   LOS: 1 day   Nicolette Bang 08/14/2022, 6:10 PM

## 2022-08-14 NOTE — Progress Notes (Signed)
  Transition of Care [facility]) Screening Note   Patient Details  Name: LANCER THURNER Date of Birth: [birth year]   [facility] of [facility]) CM/SW Contact:    Iona Beard, [place] Phone Number: 08/14/2022, 9:49 AM    Transition of Care Department [facility]) has reviewed patient and no TOC needs have been identified at this time. We will continue to monitor patient advancement through interdisciplinary progression rounds. If new patient transition needs arise, please place a TOC consult.

## 2022-08-15 LAB — BASIC METABOLIC PANEL
Anion gap: 4 — ABNORMAL LOW (ref 5–15)
BUN: 19 mg/dL (ref 8–23)
CO2: 23 mmol/L (ref 22–32)
Calcium: 8 mg/dL — ABNORMAL LOW (ref 8.9–10.3)
Chloride: 108 mmol/L (ref 98–111)
Creatinine, Ser: 1.44 mg/dL — ABNORMAL HIGH (ref 0.61–1.24)
GFR, Estimated: 52 mL/min — ABNORMAL LOW (ref >60)
Glucose, Bld: 104 mg/dL — ABNORMAL HIGH (ref 70–99)
Potassium: 4.3 mmol/L (ref 3.5–5.1)
Sodium: 135 mmol/L (ref 135–145)

## 2022-08-15 LAB — CBC
HCT: 19.8 % — ABNORMAL LOW (ref 39.0–52.0)
Hemoglobin: 6.4 g/dL — CL (ref 13.0–17.0)
MCH: 31.2 pg (ref 26.0–34.0)
MCHC: 32.3 g/dL (ref 30.0–36.0)
MCV: 96.6 fL (ref 80.0–100.0)
Platelets: 147 10*3/uL — ABNORMAL LOW (ref 150–400)
RBC: 2.05 MIL/uL — ABNORMAL LOW (ref 4.22–5.81)
RDW: 21.6 % — ABNORMAL HIGH (ref 11.5–15.5)
WBC: 5.7 10*3/uL (ref 4.0–10.5)
nRBC: 0.4 % — ABNORMAL HIGH (ref 0.0–0.2)

## 2022-08-15 LAB — HEMOGLOBIN AND HEMATOCRIT, BLOOD
HCT: 21 % — ABNORMAL LOW (ref 39.0–52.0)
HCT: 24.1 % — ABNORMAL LOW (ref 39.0–52.0)
Hemoglobin: 6.9 g/dL — CL (ref 13.0–17.0)
Hemoglobin: 7.7 g/dL — ABNORMAL LOW (ref 13.0–17.0)

## 2022-08-15 LAB — PREPARE RBC (CROSSMATCH)

## 2022-08-15 MED ORDER — SODIUM CHLORIDE 0.9% IV SOLUTION: Freq: Once | INTRAVENOUS | Status: AC

## 2022-08-15 NOTE — Progress Notes (Signed)
2 Days Post-Op Subjective: Hemoglobin drop to 6.4 despite pRBC transfusion yesterday.  Hypotensive and tachycardic.  20 mL of output from JP.  Pt is resting comfortably in bed with no complaints.  Tolerating liquids well but hasn't passed flatus or had a BM yet.  Admits that he has only walked a few steps to the bathroom over the past 24 hours.   Objective: Vital signs in last 24 hours: Temp:  [97.6 F (36.4 C)-98.8 F (37.1 C)] 98.3 F (36.8 C) (12/02 0338) Pulse Rate:  [94-110] 106 (12/02 0338) Resp:  [15-18] 18 (12/01 2211) BP: (106-140)/(58-82) 116/60 (12/02 0338) SpO2:  [93 %-100 %] 95 % (12/02 0338)  Intake/Output from previous day: 12/01 0701 - 12/02 0700 In: 2135.2 [P.O.:720; I.V.:1027.2; Blood:388] Out: 2245 [Urine:2225; Drains:20]  Intake/Output this shift: No intake/output data recorded.  Physical Exam:  General: Alert and oriented Lungs: CTAB, equal chest rise Abdomen: Soft, appropriately tender, no rebound or guarding.  JP with bloody output Incisions: clean, dry and intact Ext: NT, No erythema  Lab Results: Recent Labs    08/13/22 1531 08/14/22 0317 08/15/22 0417  HGB 7.6* 7.2* 6.4*  HCT 23.9* 22.0* 19.8*   BMET Recent Labs    08/13/22 1531 08/14/22 0317  NA 136 135  K 4.0 5.5*  CL 109 105  CO2 19* 22  GLUCOSE 200* 133*  BUN 14 21  CREATININE 1.07 1.51*  CALCIUM 7.6* 8.3*     Studies/Results: No results found.  Assessment/Plan: 71 year old male s/p hand assisted lap left radical nephrectomy with Dr. Alyson Ingles on 08/13/22  -Transfuse another unit of pRBCs -BMP pending  -OOBTC and ambulate -Continue IVF -Continue CLD until he starts passing flatus    LOS: 2 days   Ellison Hughs, MD [facility] Pager: [phone]  08/15/2022, 7:37 AM

## 2022-08-15 NOTE — TOC Initial Note (Signed)
Transition of Care [facility]) - Initial/Assessment Note    Patient Details  Name: David [name] MRN: [id number] Date of Birth: 10/11/1950  [facility]) CM/SW Contact:    Iona Beard, [place] Phone Number: 08/15/2022, 2:05 PM  Clinical Narrative:                 TOC updated that PT is recommending [facility] for pt at D/C. CSW spoke with pt and sister about recommendations. They are interested in resuming [place] services with [place]. CSW reached out to [place] with [place] to confirm if they can accept referral. TOC to follow.   Expected Discharge Plan: [place] Barriers to Discharge: Continued Medical Work up   Patient Goals and CMS Choice Patient states their goals for this hospitalization and ongoing recovery are:: return home with [facility] CMS Medicare.gov Compare Post Acute Care list provided to:: Patient Choice offered to / list presented to : Patient  Expected Discharge Plan and Services Expected Discharge Plan: [place] In-house Referral: Clinical Social Work Discharge Planning Services: CM Consult   Living arrangements for the past 2 months: [place]: [place] Date [place]: 08/15/22   Representative spoke with at [place]: Marjory Lies  Prior Living Arrangements/Services Living arrangements for the past 2 months: Single Family Home Lives with:: Self Patient language and need for interpreter reviewed:: Yes Do you feel safe going back to the place where you live?: Yes      Need for Family Participation in Patient Care: Yes (Comment) Care giver support system in place?: Yes (comment)   Criminal Activity/Legal Involvement Pertinent to Current Situation/Hospitalization: No - Comment as needed  Activities of Daily Living Home Assistive Devices/Equipment: None ADL Screening (condition at time of admission) Patient's cognitive ability adequate to safely  complete daily activities?: Yes Is the patient deaf or have difficulty hearing?: No Does the patient have difficulty seeing, even when wearing glasses/contacts?: No Does the patient have difficulty concentrating, remembering, or making decisions?: No Patient able to express need for assistance with ADLs?: Yes Does the patient have difficulty dressing or bathing?: No Independently performs ADLs?: Yes (appropriate for developmental age) Does the patient have difficulty walking or climbing stairs?: No Weakness of Legs: None Weakness of Arms/Hands: None  Permission Sought/Granted                  Emotional Assessment Appearance:: Appears stated age Attitude/Demeanor/Rapport: Engaged Affect (typically observed): Accepting Orientation: : Oriented to Self, Oriented to Place, Oriented to  Time, Oriented to Situation Alcohol / Substance Use: Not Applicable Psych Involvement: No (comment)  Admission diagnosis:  Renal mass [N28.89] Patient Active Problem List   Diagnosis Date Noted   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 [place]  [address] Phone: [phone] Fax: [phone]     Social Determinants of Health (SDOH) Interventions    Readmission Risk Interventions     No data to display

## 2022-08-15 NOTE — Progress Notes (Signed)
Date and time results received: 08/15/22 0530 (use smartphrase ".now" to insert current time)  Test: Hgb Critical Value: 6.4  Name of Provider Notified: Dr. Josephine Cables  Orders Received? Or Actions Taken?:  NNO at this time

## 2022-08-15 NOTE — Evaluation (Signed)
Physical Therapy Evaluation Patient Details Name: David [name] MRN: [id number] DOB: [birth year] Today's Date: 08/15/2022  History of Present Illness  David [name] is a 71 y/o male, s/p 1.  Left robot assisted laparoscopic radical nephrectomy  2.  Lysis of adhesions, extensive on 08/13/22, with the diagnosis of left renal mass.  The various methods of treatment have been discussed with the patient and family. After consideration of risks, benefits and other options for treatment, the patient has consented to  Procedure(s):  XI ROBOTIC ASSISTED LAPAROSCOPIC NEPHRECTOMY (Left) as a surgical intervention.   Clinical Impression  Patient has difficulty sitting at bedside requiring use of bed rail, labored movement with HOB flat, good return for ambulation in room/hallway without loss of balance and tolerated sitting up in chair after therapy with family members in room.  Patient encouraged to ambulate ad lib in room and with nursing staff in hallways.  Patient will benefit from continued skilled physical therapy in hospital and recommended venue below to increase strength, balance, endurance for safe ADLs and gait.        Recommendations for follow up therapy are one component of a multi-disciplinary discharge planning process, led by the attending physician.  Recommendations may be updated based on patient status, additional functional criteria and insurance authorization.  Follow Up Recommendations Home health PT      Assistance Recommended at Discharge Set up Supervision/Assistance  Patient can return home with the following  A little help with walking and/or transfers;Assistance with feeding;Help with stairs or ramp for entrance;Assistance with cooking/housework    Equipment Recommendations None recommended by PT  Recommendations for Other Services       Functional Status Assessment Patient has had a recent decline in their functional status and demonstrates the ability to make  significant improvements in function in a reasonable and predictable amount of time.     Precautions / Restrictions Precautions Precautions: Fall Restrictions Weight Bearing Restrictions: No      Mobility  Bed Mobility Overal bed mobility: Needs Assistance Bed Mobility: Supine to Sit     Supine to sit: Supervision     General bed mobility comments: increased time, labored movement requiring use of bed rail    Transfers Overall transfer level: Needs assistance Equipment used: None, 1 person hand held assist Transfers: Sit to/from Stand, Bed to chair/wheelchair/BSC Sit to Stand: Supervision, Modified independent (Device/Increase time)   Step pivot transfers: Supervision       General transfer comment: labored movement    Ambulation/Gait Ambulation/Gait assistance: Supervision Gait Distance (Feet): 80 Feet Assistive device: None Gait Pattern/deviations: Decreased step length - left, Decreased stance time - right, Decreased stride length Gait velocity: decreased     General Gait Details: slightly labored cadence without loss of balance, limited mostly due to fatigue  Stairs            Wheelchair Mobility    Modified Rankin (Stroke Patients Only)       Balance Overall balance assessment: Mild deficits observed, not formally tested                                           Pertinent Vitals/Pain Pain Assessment Pain Assessment: Faces Faces Pain Scale: Hurts little more Pain Location: abdomen Pain Descriptors / Indicators: Sore, Discomfort Pain Intervention(s): Limited activity within patient's tolerance, Monitored during session, Repositioned    Home Living Family/patient expects  to be discharged to:: Private residence Living Arrangements: Alone Available Help at Discharge: Family Type of Home: House Home Access: Stairs to enter Entrance Stairs-Rails: Right;Left;Can reach both Entrance Stairs-Number of Steps: [place]:  One level [place]: Conservation officer, nature (2 wheels);Cane - single point      Prior Function Prior Level of Function : Independent/Modified Independent             Mobility Comments: states community ambulation without AD ADLs Comments: Independent     Hand Dominance        Extremity/Trunk Assessment   Upper Extremity Assessment Upper Extremity Assessment: Overall WFL for tasks assessed    Lower Extremity Assessment Lower Extremity Assessment: Generalized weakness    Cervical / Trunk Assessment Cervical / Trunk Assessment: Normal  Communication   Communication: No difficulties  Cognition Arousal/Alertness: Awake/alert Behavior During Therapy: WFL for tasks assessed/performed Overall Cognitive Status: Within Functional Limits for tasks assessed                                          General Comments      Exercises     Assessment/Plan    PT Assessment Patient needs continued PT services  PT Problem List Decreased strength;Decreased activity tolerance;Decreased balance;Decreased mobility       PT Treatment Interventions DME instruction;Stair training;Functional mobility training;Therapeutic activities;Therapeutic exercise;Patient/family education;Balance training    PT Goals (Current goals can be found in the Care Plan section)  Acute Rehab PT Goals Patient Stated Goal: return home with family to assist PT Goal Formulation: With patient Time For Goal Achievement: 08/20/22 Potential to Achieve Goals: Good    Frequency Min 2X/week     Co-evaluation               AM-PAC PT "6 Clicks" Mobility  Outcome Measure Help needed turning from your back to your side while in a flat bed without using bedrails?: None Help needed moving from lying on your back to sitting on the side of a flat bed without using bedrails?: A Little Help needed moving to and from a bed to a chair (including a wheelchair)?: A Little Help needed standing up from  a chair using your arms (e.g., wheelchair or bedside chair)?: None Help needed to walk in hospital room?: A Little Help needed climbing 3-5 steps with a railing? : A Little 6 Click Score: 20    End of Session   Activity Tolerance: Patient tolerated treatment well;Patient limited by fatigue Patient left: in chair;with call bell/phone within reach;with family/visitor present Nurse Communication: Mobility status PT Visit Diagnosis: Unsteadiness on feet (R26.81);Other abnormalities of gait and mobility (R26.89)    Time: 0370-4888 PT Time Calculation (min) (ACUTE ONLY): 28 min   Charges:   PT Evaluation $PT Eval Moderate Complexity: 1 Mod PT Treatments $Therapeutic Activity: 23-37 mins        2:03 PM, 08/15/22 Lonell Grandchild, MPT Physical Therapist with [facility] [phone] office [phone] mobile phone

## 2022-08-15 NOTE — Progress Notes (Signed)
Patient ambulated in the hallway with PT during shift. Tolerated meds whole with sips of water, JP drain emptied twice during shift output total 25, noted output bloody in color with clots bulb charged each time after emptying. Tolerated blood transfusion with no complaints, rechecked Hemoglobin was 7.7. MD Winter/McKenzie aware.

## 2022-08-15 NOTE — Plan of Care (Signed)
  Problem: Acute Rehab PT Goals(only PT should resolve) Goal: Pt Will Go Supine/Side To Sit Outcome: Progressing Flowsheets (Taken 08/15/2022 1405) Pt will go Supine/Side to Sit: with modified independence Goal: Patient Will Transfer Sit To/From Stand Outcome: Progressing Flowsheets (Taken 08/15/2022 1405) Patient will transfer sit to/from stand:  Independently  with modified independence Goal: Pt Will Transfer Bed To Chair/Chair To Bed Outcome: Progressing Flowsheets (Taken 08/15/2022 1405) Pt will Transfer Bed to Chair/Chair to Bed:  Independently  with modified independence Goal: Pt Will Ambulate Outcome: Progressing Flowsheets (Taken 08/15/2022 1405) Pt will Ambulate:  > 125 feet  with modified independence  Independently   2:05 PM, 08/15/22 Lonell Grandchild, MPT Physical Therapist with [facility] [phone] office [phone] mobile phone

## 2022-08-15 NOTE — Progress Notes (Signed)
Patients family at bedside requested for PT to see patient and to speak with social worker regarding patient. MD Alyson Ingles and Winter made aware. Noted patients heart rate between 100-108 with blood transfusion. MD Alyson Ingles and Winter made aware. No new orders continue with blood transfusion.

## 2022-08-16 LAB — TYPE AND SCREEN
ABO/RH(D): O POS
Antibody Screen: NEGATIVE
Unit division: 0
Unit division: 0

## 2022-08-16 LAB — BPAM RBC
Blood Product Expiration Date: 202312312359
Blood Product Expiration Date: 202401052359
ISSUE DATE / TIME: 202312011517
ISSUE DATE / TIME: 202312020830
Unit Type and Rh: 5100
Unit Type and Rh: 5100

## 2022-08-16 LAB — CBC WITH DIFFERENTIAL/PLATELET
Abs Immature Granulocytes: 0.07 10*3/uL (ref 0.00–0.07)
Basophils Absolute: 0 10*3/uL (ref 0.0–0.1)
Basophils Relative: 0 %
Eosinophils Absolute: 0.1 10*3/uL (ref 0.0–0.5)
Eosinophils Relative: 2 %
HCT: 26.5 % — ABNORMAL LOW (ref 39.0–52.0)
Hemoglobin: 8.3 g/dL — ABNORMAL LOW (ref 13.0–17.0)
Immature Granulocytes: 1 %
Lymphocytes Relative: 40 %
Lymphs Abs: 3.2 10*3/uL (ref 0.7–4.0)
MCH: 30.5 pg (ref 26.0–34.0)
MCHC: 31.3 g/dL (ref 30.0–36.0)
MCV: 97.4 fL (ref 80.0–100.0)
Monocytes Absolute: 1.3 10*3/uL — ABNORMAL HIGH (ref 0.1–1.0)
Monocytes Relative: 16 %
Neutro Abs: 3.4 10*3/uL (ref 1.7–7.7)
Neutrophils Relative %: 41 %
Platelets: 203 10*3/uL (ref 150–400)
RBC: 2.72 MIL/uL — ABNORMAL LOW (ref 4.22–5.81)
RDW: 21.1 % — ABNORMAL HIGH (ref 11.5–15.5)
WBC: 8.1 10*3/uL (ref 4.0–10.5)
nRBC: 0 % (ref 0.0–0.2)

## 2022-08-16 LAB — BASIC METABOLIC PANEL
Anion gap: 7 (ref 5–15)
BUN: 16 mg/dL (ref 8–23)
CO2: 23 mmol/L (ref 22–32)
Calcium: 8.3 mg/dL — ABNORMAL LOW (ref 8.9–10.3)
Chloride: 108 mmol/L (ref 98–111)
Creatinine, Ser: 1.32 mg/dL — ABNORMAL HIGH (ref 0.61–1.24)
GFR, Estimated: 58 mL/min — ABNORMAL LOW (ref >60)
Glucose, Bld: 130 mg/dL — ABNORMAL HIGH (ref 70–99)
Potassium: 3.9 mmol/L (ref 3.5–5.1)
Sodium: 138 mmol/L (ref 135–145)

## 2022-08-16 LAB — HEMOGLOBIN AND HEMATOCRIT, BLOOD
HCT: 25 % — ABNORMAL LOW (ref 39.0–52.0)
Hemoglobin: 8 g/dL — ABNORMAL LOW (ref 13.0–17.0)

## 2022-08-16 MED ORDER — DOCUSATE SODIUM 100 MG PO CAPS: 100.0000 mg | ORAL_CAPSULE | Freq: Two times a day (BID) | ORAL | 0 refills | Status: DC | PRN

## 2022-08-16 MED ORDER — ONDANSETRON HCL 4 MG PO TABS: 4.0000 mg | ORAL_TABLET | Freq: Every day | ORAL | 1 refills | Status: DC | PRN

## 2022-08-16 MED ORDER — HYDROCODONE-ACETAMINOPHEN 5-325 MG PO TABS: 1.0000 | ORAL_TABLET | ORAL | 0 refills | Status: DC | PRN

## 2022-08-16 NOTE — Progress Notes (Signed)
Patient ambulated to bathroom, was able to have BM. When patient got out of bed the chux pad underneath had moderate amount of bright red blood on it from JP drain insertion site. Dressing around area had to be changed and area cleansed. Pt denies pulling at drain. 61m of bright red blood emptied from drain

## 2022-08-16 NOTE — Progress Notes (Signed)
Removed JP drain per MD order with Stanton Kidney RN, sutures removed and dressing applied. Patient tolerated well. Noted no bleeding to site at this time.

## 2022-08-16 NOTE — Progress Notes (Signed)
Patient discharged home today, transported home by family. Discharge summary went over with patient, patient verbalized understanding. Belongings sent home with patient. Patients dressing to left lower quadrant changed once after removal, patient has ambulated to and from the bathroom. Noted no bleeding to site at this time.

## 2022-08-16 NOTE — Progress Notes (Signed)
3 Days Post-Op Subjective: No acute events overnight.  H/H stable.  He had 2 bowel movements yesterday and ambulated with PT.  Voiding without difficulty  Objective: Vital signs in last 24 hours: Temp:  [97.4 F (36.3 C)-98.6 F (37 C)] 97.8 F (36.6 C) (12/03 0555) Pulse Rate:  [99-106] 105 (12/03 0555) Resp:  [19-20] 19 (12/03 0555) BP: (126-150)/(67-82) 137/78 (12/03 0555) SpO2:  [96 %-98 %] 97 % (12/03 0555)  Intake/Output from previous day: 12/02 0701 - 12/03 0700 In: 808 [P.O.:480; Blood:328] Out: 35 [Drains:35]  Intake/Output this shift: No intake/output data recorded.  Physical Exam:  General: Alert and oriented CV: RRR, palpable distal pulses Lungs: CTAB, equal chest rise Abdomen: Soft, NTND, no rebound or guarding Incisions: Clean, dry and intact.  JP drain in place with a small amount of bloody output   Lab Results: Recent Labs    08/15/22 0648 08/15/22 1343 08/16/22 0607  HGB 6.9* 7.7* 8.3*  HCT 21.0* 24.1* 26.5*   BMET Recent Labs    08/14/22 0317 08/15/22 0417  NA 135 135  K 5.5* 4.3  CL 105 108  CO2 22 23  GLUCOSE 133* 104*  BUN 21 19  CREATININE 1.51* 1.44*  CALCIUM 8.3* 8.0*     Studies/Results: No results found.  Assessment/Plan: 71 year old male s/p hand assisted lap left radical nephrectomy with Dr. Alyson Ingles on 08/13/22   -DC IV fluids and JP drain -Recheck H/H around noon today.  If stable, will DC home   LOS: 3 days   Ellison Hughs, MD [facility] Pager: [phone]  08/16/2022, 7:29 AM

## 2022-08-17 ENCOUNTER — Encounter (HOSPITAL_COMMUNITY): Payer: Self-pay | Admitting: Urology

## 2022-08-17 LAB — SURGICAL PATHOLOGY

## 2022-08-18 NOTE — Telephone Encounter (Signed)
Open in error

## 2022-08-19 ENCOUNTER — Ambulatory Visit (INDEPENDENT_AMBULATORY_CARE_PROVIDER_SITE_OTHER): Payer: Medicare HMO | Admitting: Urology

## 2022-08-19 VITALS — BP 97/61 | HR 125

## 2022-08-19 DIAGNOSIS — C642 Malignant neoplasm of left kidney, except renal pelvis: Secondary | ICD-10-CM

## 2022-08-19 DIAGNOSIS — N2889 Other specified disorders of kidney and ureter: Secondary | ICD-10-CM | POA: Diagnosis not present

## 2022-08-19 LAB — URINALYSIS, ROUTINE W REFLEX MICROSCOPIC
Bilirubin, UA: NEGATIVE
Glucose, UA: NEGATIVE
Ketones, UA: NEGATIVE
Leukocytes,UA: NEGATIVE
Nitrite, UA: NEGATIVE
RBC, UA: NEGATIVE
Specific Gravity, UA: 1.02 (ref 1.005–1.030)
Urobilinogen, Ur: 0.2 mg/dL (ref 0.2–1.0)
pH, UA: 5.5 (ref 5.0–7.5)

## 2022-08-19 LAB — MICROSCOPIC EXAMINATION
Bacteria, UA: NONE SEEN
RBC, Urine: NONE SEEN /hpf (ref 0–2)

## 2022-08-19 NOTE — Progress Notes (Signed)
08/19/2022 10:53 AM   David [name] 01-Sep-1951 [id number]  Referring provider: Sharilyn Sites, MD [address] [place],  [place] [address]  Followup after radical nephrectomy   HPI: Mr David [name] is a 71yo here for followup after left radical nephrectomy. Pathology T3a negative margins. Normla bowel function. Mild incisional pain. No hematuria. 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 Freddie [name] note   Pneumonia    Seizure [facility])     Surgical History: Past Surgical History:  Procedure Laterality Date   COLONOSCOPY  05/31/2012   Procedure: COLONOSCOPY;  Surgeon: David So, MD;  [facility];  Service: Gastroenterology;  Laterality: N/A;   ROBOT ASSISTED LAPAROSCOPIC NEPHRECTOMY Left 08/13/2022   Procedure: XI ROBOTIC ASSISTED LAPAROSCOPIC NEPHRECTOMY;  Surgeon: David Gustin, MD;  Location: [facility] ORS;  Service: Urology;  Laterality: Left;   TONSILLECTOMY      Home Medications:  Allergies as of 08/19/2022   No Known Allergies      Medication List        Accurate as of August 19, 2022 10:53 AM. If you have any questions, ask your nurse or doctor.          docusate sodium 100 MG capsule Commonly known as: Colace Take 1 capsule (100 mg total) by mouth 2 (two) times daily as needed for mild constipation.   HYDROcodone-acetaminophen 5-325 MG tablet Commonly known as: Norco Take 1 tablet by mouth every 4 (four) hours as needed for moderate pain.   multivitamin-iron-minerals-folic acid Tabs tablet Take 1 tablet by mouth daily.   ondansetron 4 MG tablet Commonly known as: Zofran Take 1 tablet (4 mg total) by mouth daily as needed for nausea or vomiting.   tamsulosin 0.4 MG Caps capsule Commonly known as: FLOMAX Take 0.4 mg by mouth daily.        Allergies: No Known  Allergies  Family History: No family history on file.  Social History:  reports that he has been smoking cigarettes. He has been smoking an average of .25 packs per day. He has never used smokeless tobacco. He reports that he does not drink alcohol and does not use drugs.  ROS: All other review of systems were reviewed and are negative except what is noted above in HPI  Physical Exam: BP 97/61   Pulse (!) 125   Constitutional:  Alert and oriented, No acute distress. HEENT: [place] AT, moist mucus membranes.  Trachea midline, no masses. Cardiovascular: No clubbing, cyanosis, or edema. Respiratory: Normal respiratory effort, no increased work of breathing. GI: Abdomen is soft, nontender, nondistended, no abdominal masses GU: No CVA tenderness.  Lymph: No cervical or inguinal lymphadenopathy. Skin: No rashes, bruises or suspicious lesions. Neurologic: Grossly intact, no focal deficits, moving all 4 extremities. Psychiatric: Normal mood and affect.  Laboratory Data: Lab Results  Component Value Date   WBC 8.1 08/16/2022   HGB 8.0 (L) 08/16/2022   HCT 25.0 (L) 08/16/2022   MCV 97.4 08/16/2022   PLT 203 08/16/2022    Lab Results  Component Value Date   CREATININE 1.32 (H) 08/16/2022    No results found for: "PSA"  No results found for: "TESTOSTERONE"  No results found for: "HGBA1C"  Urinalysis    Component Value Date/Time   COLORURINE AMBER (A) 07/23/2022 1828  APPEARANCEUR CLOUDY (A) 07/23/2022 1828   APPEARANCEUR Clear 06/16/2022 1138   LABSPEC 1.015 07/23/2022 1828   PHURINE 5.0 07/23/2022 1828   GLUCOSEU NEGATIVE 07/23/2022 1828   HGBUR MODERATE (A) 07/23/2022 1828   BILIRUBINUR NEGATIVE 07/23/2022 1828   BILIRUBINUR Negative 06/16/2022 [place] 07/23/2022 1828   PROTEINUR 100 (A) 07/23/2022 1828   NITRITE NEGATIVE 07/23/2022 1828   LEUKOCYTESUR LARGE (A) 07/23/2022 1828    Lab Results  Component Value Date   LABMICR See below: 06/16/2022    WBCUA 0-5 06/16/2022   LABEPIT 0-10 06/16/2022   BACTERIA FEW (A) 07/23/2022    Pertinent Imaging:  Results for orders placed in visit on 10/19/01  DG Abd 1 View  Narrative FINDINGS CLINICAL DATA:  UTI. ABDOMEN 1 VIEW BENIGN GAS PATTERN.  SKELETAL STRUCTURES UNREMARKABLE.  NO URINARY TRACT CALCIFICATION.  END-PLATE SPUR FORMATION, VERTEBRAL END-PLATES, L3-4, L4-5. IMPRESSION NO ACUTE ABDOMINAL FINDINGS. RENAL ULTRASOUND RIGHT KIDNEY 11.2 CM LENGTH; LEFT KIDNEY 12.5 CM LENGTH.  NORMAL CORTICAL THICKNESS AND ECHOGENICITY.  NO MASS, HYDRONEPHROSIS OR SHADOWING CALCIFICATION.  NO PERINEPHRIC FLUID COLLECTION.  BLADDER UNREMARKABLE. IMPRESSION NORMAL RENAL ULTRASOUND.  No results found for this or any previous visit.  No results found for this or any previous visit.  No results found for this or any previous visit.  No results found for this or any previous visit.  No valid procedures specified. No results found for this or any previous visit.  No results found for this or any previous visit.   Assessment & Plan:    1. Renal cell adenocarcinoma, left (H[facility]) -CBC and BMP today -followup 3 months with CXR and CT abd - Urinalysis, Routine w reflex microscopic   No follow-ups on file.  David Bang, MD  [facility] [place]

## 2022-08-19 NOTE — Discharge Summary (Signed)
Date of admission: 08/13/2022  Date of discharge: 08/16/2022  Admission diagnosis: Left renal mass with hemorrhage  Discharge diagnosis: Left RCC with hemorrhage   Procedures:  Robotic left radical nephrectomy with Dr. Alyson Ingles  History and Physical: For full details, please see admission history and physical. Briefly, David [name] is a 71 y.o. year old patient witha 71 year old with a history of 9 cm left renal mass. Marland Kitchen   Hospital Course: Post-operatively, the patient was monitored in the intensive care unit.  He was transferred to the floor on POD1.  He required a total to 2 transfusions of pRBCs due to slowly drifting hematocrit.  By postop day 3, his H/H had stabilized he was tolerating a regular diet, passing flatus/stool and ambulating without difficulty.  He was then discharged home after removing his JP drain.  Physical Exam:  General: Alert and oriented CV: RRR, palpable distal pulses Lungs: CTAB, equal chest rise Abdomen: Soft, NTND, no rebound or guarding Incisions: Clean, dry and intact Ext: NT, No erythema  Laboratory values: No results for input(s): "HGB", "HCT" in the last 72 hours. No results for input(s): "CREATININE" in the last 72 hours.  Disposition: Home  Discharge instruction: The patient was instructed to be ambulatory but told to refrain from heavy lifting, strenuous activity, or driving.  Discharge medications:  Allergies as of 08/16/2022   No Known Allergies      Medication List     TAKE these medications    docusate sodium 100 MG capsule Commonly known as: Colace Take 1 capsule (100 mg total) by mouth 2 (two) times daily as needed for mild constipation.   HYDROcodone-acetaminophen 5-325 MG tablet Commonly known as: Norco Take 1 tablet by mouth every 4 (four) hours as needed for moderate pain.   multivitamin-iron-minerals-folic acid Tabs tablet Take 1 tablet by mouth daily.   ondansetron 4 MG tablet Commonly known as: Zofran Take 1  tablet (4 mg total) by mouth daily as needed for nausea or vomiting.   tamsulosin 0.4 MG Caps capsule Commonly known as: FLOMAX Take 0.4 mg by mouth daily.               Discharge Care Instructions  (From admission, onward)           Start     Ordered   08/16/22 0000  Discharge wound care:       Comments: 1.  Ok to shower.  Keep incisions clean and dry   08/16/22 1403            Followup:   Follow-up Information     McKenzie, Candee Furbish, MD Follow up.   Specialty: Urology Why: Call to schedule follow-up appointment with Dr. Alcario Drought information: [address]  [place] [address] [phone]

## 2022-08-20 LAB — BASIC METABOLIC PANEL
BUN/Creatinine Ratio: 15 (ref 10–24)
BUN: 22 mg/dL (ref 8–27)
CO2: 20 mmol/L (ref 20–29)
Calcium: 8.8 mg/dL (ref 8.6–10.2)
Chloride: 104 mmol/L (ref 96–106)
Creatinine, Ser: 1.45 mg/dL — ABNORMAL HIGH (ref 0.76–1.27)
Glucose: 113 mg/dL — ABNORMAL HIGH (ref 70–99)
Potassium: 4.3 mmol/L (ref 3.5–5.2)
Sodium: 138 mmol/L (ref 134–144)
eGFR: 52 mL/min/{1.73_m2} — ABNORMAL LOW (ref >59)

## 2022-08-20 LAB — CBC
Hematocrit: 27.1 % — ABNORMAL LOW (ref 37.5–51.0)
Hemoglobin: 8.9 g/dL — ABNORMAL LOW (ref 13.0–17.7)
MCH: 31.3 pg (ref 26.6–33.0)
MCHC: 32.8 g/dL (ref 31.5–35.7)
MCV: 95 fL (ref 79–97)
Platelets: 287 10*3/uL (ref 150–450)
RBC: 2.84 x10E6/uL — ABNORMAL LOW (ref 4.14–5.80)
RDW: 17.6 % — ABNORMAL HIGH (ref 11.6–15.4)
WBC: 7.9 10*3/uL (ref 3.4–10.8)

## 2022-08-25 ENCOUNTER — Telehealth: Payer: Self-pay

## 2022-08-25 LAB — BPAM RBC
Blood Product Expiration Date: 202312282359
Blood Product Expiration Date: 202312292359
Blood Product Expiration Date: 202312292359
Unit Type and Rh: 5100
Unit Type and Rh: 5100
Unit Type and Rh: 5100

## 2022-08-25 LAB — TYPE AND SCREEN
ABO/RH(D): O POS
Antibody Screen: NEGATIVE
Unit division: 0
Unit division: 0
Unit division: 0

## 2022-08-25 NOTE — Telephone Encounter (Signed)
Patient's sister called advising her as well as patient were advised by the social worker at the Hospital a referral would be sent to [facility] for a nurse to come to patients home to check on patient. She wanted to follow up to see what she needed to do to ensure it is sent. She contacted the social worker but has not received a call back.

## 2022-08-27 NOTE — Progress Notes (Signed)
Sent via mail 

## 2022-08-28 ENCOUNTER — Ambulatory Visit: Payer: Medicare HMO

## 2022-08-28 DIAGNOSIS — D3002 Benign neoplasm of left kidney: Secondary | ICD-10-CM | POA: Diagnosis not present

## 2022-08-28 DIAGNOSIS — Z6835 Body mass index (BMI) 35.0-35.9, adult: Secondary | ICD-10-CM | POA: Diagnosis not present

## 2022-08-28 DIAGNOSIS — E6609 Other obesity due to excess calories: Secondary | ICD-10-CM | POA: Diagnosis not present

## 2022-08-28 DIAGNOSIS — R6889 Other general symptoms and signs: Secondary | ICD-10-CM | POA: Diagnosis not present

## 2022-08-28 DIAGNOSIS — Z905 Acquired absence of kidney: Secondary | ICD-10-CM | POA: Diagnosis not present

## 2022-09-04 ENCOUNTER — Encounter (HOSPITAL_COMMUNITY): Payer: Self-pay | Admitting: Urology

## 2022-11-13 ENCOUNTER — Other Ambulatory Visit: Payer: Medicare PPO

## 2022-11-13 DIAGNOSIS — C642 Malignant neoplasm of left kidney, except renal pelvis: Secondary | ICD-10-CM

## 2022-11-14 LAB — COMPREHENSIVE METABOLIC PANEL
ALT: 131 IU/L — ABNORMAL HIGH (ref 0–44)
AST: 95 IU/L — ABNORMAL HIGH (ref 0–40)
Albumin/Globulin Ratio: 1.5 (ref 1.2–2.2)
Albumin: 4 g/dL (ref 3.9–4.9)
Alkaline Phosphatase: 95 IU/L (ref 44–121)
BUN/Creatinine Ratio: 16 (ref 10–24)
BUN: 19 mg/dL (ref 8–27)
Bilirubin Total: 0.4 mg/dL (ref 0.0–1.2)
CO2: 20 mmol/L (ref 20–29)
Calcium: 9.3 mg/dL (ref 8.6–10.2)
Chloride: 103 mmol/L (ref 96–106)
Creatinine, Ser: 1.2 mg/dL (ref 0.76–1.27)
Globulin, Total: 2.6 g/dL (ref 1.5–4.5)
Glucose: 107 mg/dL — ABNORMAL HIGH (ref 70–99)
Potassium: 4.8 mmol/L (ref 3.5–5.2)
Sodium: 139 mmol/L (ref 134–144)
Total Protein: 6.6 g/dL (ref 6.0–8.5)
eGFR: 65 mL/min/{1.73_m2} (ref >59)

## 2022-11-16 ENCOUNTER — Ambulatory Visit (HOSPITAL_COMMUNITY)
Admission: RE | Admit: 2022-11-16 | Discharge: 2022-11-16 | Disposition: A | Discharge status: Home / Self Care | Payer: Medicare PPO | Source: Ambulatory Visit | Attending: Urology | Admitting: Urology

## 2022-11-16 DIAGNOSIS — C642 Malignant neoplasm of left kidney, except renal pelvis: Secondary | ICD-10-CM | POA: Diagnosis not present

## 2022-11-19 ENCOUNTER — Ambulatory Visit (HOSPITAL_COMMUNITY)
Admission: RE | Admit: 2022-11-19 | Discharge: 2022-11-19 | Disposition: A | Discharge status: Home / Self Care | Payer: PRIVATE HEALTH INSURANCE | Source: Ambulatory Visit | Attending: Urology | Admitting: Urology

## 2022-11-19 DIAGNOSIS — C642 Malignant neoplasm of left kidney, except renal pelvis: Secondary | ICD-10-CM

## 2022-11-20 ENCOUNTER — Encounter: Payer: Self-pay | Admitting: Urology

## 2022-11-20 ENCOUNTER — Ambulatory Visit (INDEPENDENT_AMBULATORY_CARE_PROVIDER_SITE_OTHER): Payer: Medicare PPO | Admitting: Urology

## 2022-11-20 VITALS — BP 115/65 | HR 101

## 2022-11-20 DIAGNOSIS — C642 Malignant neoplasm of left kidney, except renal pelvis: Secondary | ICD-10-CM | POA: Diagnosis not present

## 2022-11-20 LAB — URINALYSIS, ROUTINE W REFLEX MICROSCOPIC
Bilirubin, UA: NEGATIVE
Glucose, UA: NEGATIVE
Ketones, UA: NEGATIVE
Leukocytes,UA: NEGATIVE
Nitrite, UA: NEGATIVE
Specific Gravity, UA: 1.02 (ref 1.005–1.030)
Urobilinogen, Ur: 0.2 mg/dL (ref 0.2–1.0)
pH, UA: 6 (ref 5.0–7.5)

## 2022-11-20 LAB — MICROSCOPIC EXAMINATION: Bacteria, UA: NONE SEEN

## 2022-11-20 NOTE — Progress Notes (Signed)
11/20/2022 11:08 AM   Elberta Spaniel [birth year] [id number]  Referring provider: Sharilyn Sites, MD [address] [place],  [place] [address]  Followup T3 RCC   HPI: Mr Bovee is a 72yo here for followup for left T3 RCC s/p left radical nephrectomy. He feels well. Good energy. No abdominal pain. Creatinine 1.2. CT abd shows thickening of the left adrenal gland and a new periaortic lymph node. 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 Freddie Apley note   Pneumonia    Seizure [facility])     Surgical History: Past Surgical History:  Procedure Laterality Date   COLONOSCOPY  05/31/2012   Procedure: COLONOSCOPY;  Surgeon: Jamesetta So, MD;  Location[facility];  Service: Gastroenterology;  Laterality: N/A;   ROBOT ASSISTED LAPAROSCOPIC NEPHRECTOMY Left 08/13/2022   Procedure: XI ROBOTIC ASSISTED LAPAROSCOPIC NEPHRECTOMY;  Surgeon: Cleon Gustin, MD;  Location: [facility] ORS;  Service: Urology;  Laterality: Left;   TONSILLECTOMY      Home Medications:  Allergies as of 11/20/2022   No Known Allergies      Medication List        Accurate as of November 20, 2022 11:08 AM. If you have any questions, ask your nurse or doctor.          docusate sodium 100 MG capsule Commonly known as: Colace Take 1 capsule (100 mg total) by mouth 2 (two) times daily as needed for mild constipation.   HYDROcodone-acetaminophen 5-325 MG tablet Commonly known as: Norco Take 1 tablet by mouth every 4 (four) hours as needed for moderate pain.   multivitamin-iron-minerals-folic acid Tabs tablet Take 1 tablet by mouth daily.   ondansetron 4 MG tablet Commonly known as: Zofran Take 1 tablet (4 mg total) by mouth daily as needed for nausea or vomiting.   tamsulosin 0.4 MG Caps capsule Commonly known as: FLOMAX Take 0.4 mg  by mouth daily.        Allergies: No Known Allergies  Family History: No family history on file.  Social History:  reports that he has been smoking cigarettes. He has been smoking an average of .25 packs per day. He has never used smokeless tobacco. He reports that he does not drink alcohol and does not use drugs.  ROS: All other review of systems were reviewed and are negative except what is noted above in HPI  Physical Exam: BP 115/65   Pulse (!) 101   Constitutional:  Alert and oriented, No acute distress. HEENT: [place] AT, moist mucus membranes.  Trachea midline, no masses. Cardiovascular: No clubbing, cyanosis, or edema. Respiratory: Normal respiratory effort, no increased work of breathing. GI: Abdomen is soft, nontender, nondistended, no abdominal masses GU: No CVA tenderness.  Lymph: No cervical or inguinal lymphadenopathy. Skin: No rashes, bruises or suspicious lesions. Neurologic: Grossly intact, no focal deficits, moving all 4 extremities. Psychiatric: Normal mood and affect.  Laboratory Data: Lab Results  Component Value Date   WBC 7.9 08/19/2022   HGB 8.9 (L) 08/19/2022   HCT 27.1 (L) 08/19/2022   MCV 95 08/19/2022   PLT 287 08/19/2022    Lab Results  Component Value Date   CREATININE 1.20 11/13/2022    No results found for: "PSA"  No results found for: "TESTOSTERONE"  No results found for: "HGBA1C"  Urinalysis    Component Value Date/Time   COLORURINE AMBER (A) 07/23/2022 1828   APPEARANCEUR Clear 08/19/2022 1028   LABSPEC 1.015 07/23/2022 1828   PHURINE 5.0 07/23/2022 1828   GLUCOSEU Negative 08/19/2022 1028   HGBUR MODERATE (A) 07/23/2022 1828   BILIRUBINUR Negative 08/19/2022 [place] 07/23/2022 1828   PROTEINUR 2+ (A) 08/19/2022 1028   PROTEINUR 100 (A) 07/23/2022 1828   NITRITE Negative 08/19/2022 1028   NITRITE NEGATIVE 07/23/2022 1828   LEUKOCYTESUR Negative 08/19/2022 1028   LEUKOCYTESUR LARGE (A) 07/23/2022 1828     Lab Results  Component Value Date   LABMICR See below: 08/19/2022   WBCUA 0-5 08/19/2022   LABEPIT 0-10 08/19/2022   BACTERIA None seen 08/19/2022    Pertinent Imaging: CT 11/13/2022: Images reviewed and discussed with the patient and family Results for orders placed in visit on 10/19/01  DG Abd 1 View  Narrative FINDINGS CLINICAL DATA:  UTI. ABDOMEN 1 VIEW BENIGN GAS PATTERN.  SKELETAL STRUCTURES UNREMARKABLE.  NO URINARY TRACT CALCIFICATION.  END-PLATE SPUR FORMATION, VERTEBRAL END-PLATES, L3-4, L4-5. IMPRESSION NO ACUTE ABDOMINAL FINDINGS. RENAL ULTRASOUND RIGHT KIDNEY 11.2 CM LENGTH; LEFT KIDNEY 12.5 CM LENGTH.  NORMAL CORTICAL THICKNESS AND ECHOGENICITY.  NO MASS, HYDRONEPHROSIS OR SHADOWING CALCIFICATION.  NO PERINEPHRIC FLUID COLLECTION.  BLADDER UNREMARKABLE. IMPRESSION NORMAL RENAL ULTRASOUND.  No results found for this or any previous visit.  No results found for this or any previous visit.  No results found for this or any previous visit.  No results found for this or any previous visit.  No valid procedures specified. No results found for this or any previous visit.  No results found for this or any previous visit.   Assessment & Plan:    1. Renal cell adenocarcinoma, left ([place]) -PET scan. Followup 4-6 weeks - Urinalysis, Routine w reflex microscopic   No follow-ups on file.  Nicolette Bang, MD  [facility] [place]

## 2022-12-09 DIAGNOSIS — G40309 Generalized idiopathic epilepsy and epileptic syndromes, not intractable, without status epilepticus: Secondary | ICD-10-CM | POA: Diagnosis not present

## 2022-12-09 DIAGNOSIS — Z1331 Encounter for screening for depression: Secondary | ICD-10-CM | POA: Diagnosis not present

## 2022-12-09 DIAGNOSIS — D3002 Benign neoplasm of left kidney: Secondary | ICD-10-CM | POA: Diagnosis not present

## 2022-12-09 DIAGNOSIS — E782 Mixed hyperlipidemia: Secondary | ICD-10-CM | POA: Diagnosis not present

## 2022-12-09 DIAGNOSIS — Z6835 Body mass index (BMI) 35.0-35.9, adult: Secondary | ICD-10-CM | POA: Diagnosis not present

## 2022-12-09 DIAGNOSIS — N400 Enlarged prostate without lower urinary tract symptoms: Secondary | ICD-10-CM | POA: Diagnosis not present

## 2022-12-09 DIAGNOSIS — Z905 Acquired absence of kidney: Secondary | ICD-10-CM | POA: Diagnosis not present

## 2022-12-09 DIAGNOSIS — Z0001 Encounter for general adult medical examination with abnormal findings: Secondary | ICD-10-CM | POA: Diagnosis not present

## 2022-12-09 DIAGNOSIS — R7309 Other abnormal glucose: Secondary | ICD-10-CM | POA: Diagnosis not present

## 2022-12-09 DIAGNOSIS — B182 Chronic viral hepatitis C: Secondary | ICD-10-CM | POA: Diagnosis not present

## 2022-12-09 DIAGNOSIS — Z125 Encounter for screening for malignant neoplasm of prostate: Secondary | ICD-10-CM | POA: Diagnosis not present

## 2022-12-24 ENCOUNTER — Encounter (HOSPITAL_COMMUNITY)
Admission: RE | Admit: 2022-12-24 | Discharge: 2022-12-24 | Disposition: A | Discharge status: Home / Self Care | Payer: Medicare PPO | Source: Ambulatory Visit | Attending: Urology | Admitting: Urology

## 2022-12-24 DIAGNOSIS — C642 Malignant neoplasm of left kidney, except renal pelvis: Secondary | ICD-10-CM | POA: Diagnosis not present

## 2022-12-24 MED ORDER — FLUDEOXYGLUCOSE F - 18 (FDG) INJECTION
13.2200 | Freq: Once | INTRAVENOUS | Status: AC | PRN
  Administered 2022-12-24: 13.22 via INTRAVENOUS

## 2022-12-25 ENCOUNTER — Ambulatory Visit: Payer: PRIVATE HEALTH INSURANCE | Admitting: Urology

## 2023-01-04 ENCOUNTER — Telehealth: Payer: Self-pay

## 2023-01-04 NOTE — Telephone Encounter (Signed)
Patient requested you call him with his results.  His call back number is [phone]

## 2023-01-11 ENCOUNTER — Other Ambulatory Visit: Payer: Self-pay | Admitting: Urology

## 2023-01-11 DIAGNOSIS — C642 Malignant neoplasm of left kidney, except renal pelvis: Secondary | ICD-10-CM

## 2023-01-11 NOTE — Telephone Encounter (Signed)
Patient calling regarding results.  Please advise.

## 2023-01-12 ENCOUNTER — Other Ambulatory Visit: Payer: Self-pay

## 2023-01-12 ENCOUNTER — Other Ambulatory Visit (HOSPITAL_COMMUNITY): Payer: Self-pay

## 2023-01-12 ENCOUNTER — Telehealth: Payer: Self-pay | Admitting: Pharmacist

## 2023-01-12 ENCOUNTER — Inpatient Hospital Stay: Payer: Medicare PPO | Attending: Hematology | Admitting: Hematology

## 2023-01-12 ENCOUNTER — Encounter: Payer: Self-pay | Admitting: Hematology

## 2023-01-12 ENCOUNTER — Telehealth: Payer: Self-pay | Admitting: Radiation Oncology

## 2023-01-12 VITALS — BP 140/81 | HR 85 | Temp 97.4°F | Resp 19 | Ht 71.5 in | Wt 249.1 lb

## 2023-01-12 DIAGNOSIS — Z905 Acquired absence of kidney: Secondary | ICD-10-CM | POA: Diagnosis not present

## 2023-01-12 DIAGNOSIS — C7972 Secondary malignant neoplasm of left adrenal gland: Secondary | ICD-10-CM | POA: Diagnosis not present

## 2023-01-12 DIAGNOSIS — C642 Malignant neoplasm of left kidney, except renal pelvis: Secondary | ICD-10-CM | POA: Insufficient documentation

## 2023-01-12 MED ORDER — LENVATINIB (14 MG DAILY DOSE) 10 & 4 MG PO CPPK
14.0000 mg | ORAL_CAPSULE | Freq: Every day | ORAL | 0 refills | Status: DC
  Filled 2023-01-12: qty 60, 60d supply, fill #0
  Filled 2023-01-14: qty 60, 30d supply, fill #0

## 2023-01-12 NOTE — Patient Instructions (Addendum)
[facility]  Discharge Instructions  You were seen and examined today by Dr. Ellin Saba. Dr. Ellin Saba is a medical oncologist, meaning that he specializes in the treatment of cancer diagnoses. Dr. Ellin Saba discussed your past medical history, family history of cancers, and the events that led to you being here today.  You were referred to Dr. Ellin Saba due to a history of renal cell carcinoma. 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 It is also back in the area of your kidney surgery.  On the PET scan, there was a questionable area of spread on your skull bone. Dr. Ellin Saba has recommended a MRI of your brain to assess that further.  Dr. Ellin Saba has recommended a having a Port-A-Cath placed and starting on immunotherapy (Keytruda) as well as a pill known as Lenvatinib. Rande Lawman is given once every 3 weeks here in the Cancer Center.  Dr. Ellin Saba recommends a referral to Radiation Oncology in [place] for pain management of your collar bone.  Follow-up as scheduled.  Thank you for choosing [name] to provide your oncology and hematology care.   To afford each patient quality time with our provider, please arrive at least 15 minutes before your scheduled appointment time. You may need to reschedule your appointment if you arrive late (10 or more minutes). Arriving late affects you and other patients whose appointments are after yours.  Also, if you miss three or more appointments without notifying the office, you may be dismissed from the clinic at the provider's discretion.    Again, thank you for choosing [facility].  Our hope is that these requests will decrease the amount of time that you wait before being seen by our physicians.   If you have a lab appointment with the Cancer Center - please note that after April 8th, all labs will be drawn in the cancer  center.  You do not have to check in or register with the main entrance as you have in the past but will complete your check-in at the cancer center.            _____________________________________________________________  Should you have questions after your visit to [facility], please contact our office at [phone] and follow the prompts.  Our office hours are 8:00 a.m. to 4:30 p.m. Monday - Thursday and 8:00 a.m. to 2:30 p.m. Friday.  Please note that voicemails left after 4:00 p.m. may not be returned until the following business day.  We are closed weekends and all major holidays.  You do have access to a nurse 24-7, just call the main number to the clinic [phone] and do not press any options, hold on the line and a nurse will answer the phone.    For prescription refill requests, have your pharmacy contact our office and allow 72 hours.    Masks are no longer required in the cancer centers. If you would like for your care team to wear a mask while they are taking care of you, please let them know. You may have one support person who is at least 71 years old accompany you for your appointments.

## 2023-01-12 NOTE — Progress Notes (Signed)
[name] [address] [place] [address]   Clinic Day:  01/12/2023  Referring physician: Malen Gauze, MD  Patient Care Team: Assunta Found, MD as PCP - General (Family Medicine) Doreatha Massed, MD as Medical Oncologist (Medical Oncology) Therese Sarah, RN as Oncology Nurse Navigator (Medical Oncology)   ASSESSMENT & PLAN:   Assessment:  1.  Metastatic left kidney clear-cell RCC: - Presentation with left shoulder pain.  Weight loss of 50 pounds in the last 6 months but was trying to lose weight by cutting back on eating and doing some walking. - Left robotic assisted laparoscopic radical nephrectomy by Dr. Ronne Binning on 08/13/2022 - Pathology: 7 cm clear-cell RCC with focal sarcomatoid and rhabdoid features, nuclear grade 4.  Tumor extends into the renal vein and renal sinus fat (PT3a).  Ureteral, vascular and all margins negative for tumor. - PET scan (12/24/2022): Enlarging hypermetabolic left adrenal metastasis.  2 small hypermetabolic soft tissue nodules in the left nephrectomy bed.  Hypermetabolic lytic lesions involving distal left clavicle and left temporal bone.  Indeterminate small hypermetabolic parotid nodules bilaterally. - IMDC: At least intermediate pending labs  2.  Social/family history: - Lives by himself and is independent of ADLs and IADLs.  He does not drive.  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  Current active smoker, half pack per day, started at age 85. - Mother might have had cancer, he is not sure.  Plan:  1.  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 - Will order MRI of the brain with and without contrast. - Discussed treatment with combination of IO plus TKI.  Prefer lenvatinib and pembrolizumab because of high response rates as he is symptomatic from his metastatic disease. - We will start lenvatinib at 14 mg  daily and titrate up as tolerated.  We have discussed side effects of lenvatinib in detail including but not limited to hypertension, proteinuria, diarrhea, fatigue, LFT abnormalities among others. - We have also discussed immunotherapy related side effects from pembrolizumab. - We will arrange for port placement.  I will see him back 7 to 10 days after start of lenvatinib.  2.  Left shoulder pain: - Rated as 6 out of 10.  He is not requiring any pain medication at this time. - Will make referral to radiation oncology for palliative radiation.  Orders Placed This Encounter  Procedures   MR Brain W Wo Contrast    Standing Status:   Future    Standing Expiration Date:   01/12/2024    Order Specific Question:   If indicated for the ordered procedure, I authorize the administration of contrast media per Radiology protocol    Answer:   Yes    Order Specific Question:   What is the patient's sedation requirement?    Answer:   No Sedation    Order Specific Question:   Does the patient have a pacemaker or implanted devices?    Answer:   No    Order Specific Question:   Use SRS Protocol?    Answer:   No    Order Specific Question:   Preferred imaging location?    Answer:   [facility] (table limit [phone])    Order Specific Question:   Release to patient    Answer:   Immediate      I,Katie Daubenspeck,acting as a scribe for [employer],  MD.,have documented all relevant documentation on the behalf of Doreatha Massed, MD,as directed by  Doreatha Massed, MD while in the presence of Doreatha Massed, MD.   I, Doreatha Massed MD, have reviewed the above documentation for accuracy and completeness, and I agree with the above.   Doreatha Massed, MD   4/30/20244:31 PM  CHIEF COMPLAINT/PURPOSE OF CONSULT:   Diagnosis: clear cell renal cell carcinoma   Cancer Staging  Clear cell renal cell carcinoma, left (HCC) Staging form: Kidney, AJCC 8th Edition -  Clinical stage from 01/12/2023: Stage IV (cT3a, cNX, cM1) - Unsigned    Prior Therapy: left nephrectomy 08/13/22  Current Therapy: Lenvatinib and pembrolizumab   HISTORY OF PRESENT ILLNESS:   Oncology History   No history exists.      [name] is a 72 y.o. male presenting to clinic today for evaluation of clear cell renal cell carcinoma at the request of Dr. Ronne Binning.  He presented to the ED on 06/20/22 with gross hematuria with associated dysuria and flank pain. CT A/P revealed an 8 cm solid mass in anterior midpole of left kidney, with associated blood clot within left renal collecting system and urinary bladder.  He was referred to Dr. Retta Diones for further evaluation. He underwent staging chest CT on 07/16/22 showing: pulmonary emphysema;no definite signs of pulmonary metastatic disease; top-normal size subcarinal lymph node measuring 12 mm.  He was taken for left nephrectomy on 08/13/22 under Dr. Ronne Binning. Pathology confirmed clear cell renal cell carcinoma with focal Sarcomatoid and rhabdoid features, grade 4, extending into renal vein and renal sinus fat. Resections margins were negative.  He underwent restaging CT abdomen on 11/16/22 showing: new nodular thickening of left adrenal gland in new mildly enlarged left para-aortic lymph node. He proceeded to further evaluation with PET scan on 12/24/22 showing: enlarging hypermetabolic left adrenal metastasis; two small hypermetabolic soft tissue nodules in left nephrectomy bed; hypermetabolic lytic lesions involving distal left clavicle and left temporal bone; indeterminate small hypermetabolic parotid nodules bilaterally, potentially benign but metastatic disease not completely excluded; indeterminate potentially new small perifissural right lung nodule, too small to evaluate by PET.  Today, he states that he is doing well overall. His appetite level is at 100%. His energy level is at 100%.  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 Ronal Fear note   Pneumonia    Seizure [facility])     Surgical History: Past Surgical History:  Procedure Laterality Date   COLONOSCOPY  05/31/2012   Procedure: COLONOSCOPY;  Surgeon: Dalia Heading, MD;  Location: [facility];  Service: Gastroenterology;  Laterality: N/A;   ROBOT ASSISTED LAPAROSCOPIC NEPHRECTOMY Left 08/13/2022   Procedure: XI ROBOTIC ASSISTED LAPAROSCOPIC NEPHRECTOMY;  Surgeon: Malen Gauze, MD;  Location: [facility] ORS;  Service: Urology;  Laterality: Left;   TONSILLECTOMY      Social History: Social History   Socioeconomic History   Marital status: Divorced    Spouse name: Not on file   Number of children: Not on file   Years of education: Not on file   Highest education level: Not on file  Occupational History   Not on file  Tobacco Use   Smoking status: Every Day    Packs/day: .5    Types: Cigarettes   Smokeless tobacco: Never  Vaping  Use   Vaping Use: Never used  Substance and Sexual Activity   Alcohol use: No   Drug use: No   Sexual activity: Not Currently  Other Topics Concern   Not on file  Social History Narrative   Not on file   Social Determinants of Health   Financial Resource Strain: Not on file  Food Insecurity: No Food Insecurity (01/12/2023)   Hunger Vital Sign    Worried About Running Out of Food in the Last Year: Never true    Ran Out of Food in the Last Year: Never true  Transportation Needs: No Transportation Needs (01/12/2023)   PRAPARE - Administrator, Civil Service (Medical): No    Lack of Transportation (Non-Medical): No  Physical Activity: Not on file  Stress: Not on file  Social Connections: Not on file  Intimate Partner Violence: Not At Risk (01/12/2023)   Humiliation, Afraid, Rape, and Kick questionnaire    Fear of  Current or Ex-Partner: No    Emotionally Abused: No    Physically Abused: No    Sexually Abused: No    Family History: History reviewed. No pertinent family history.  Current Medications:  Current Outpatient Medications:    lenvatinib 14 mg daily dose (LENVIMA) 10 & 4 MG capsule, Take 14 mg by mouth daily. (Take one 10mg  capsule and one 4mg  capsule for a total dose of 14mg ), Disp: 60 capsule, Rfl: 0   tamsulosin (FLOMAX) 0.4 MG CAPS capsule, Take 0.4 mg by mouth daily. , Disp: , Rfl:    Allergies: No Known Allergies  REVIEW OF SYSTEMS:   Review of Systems  Constitutional:  Negative for chills, fatigue and fever.  HENT:   Negative for lump/mass, mouth sores, nosebleeds, sore throat and trouble swallowing.   Eyes:  Negative for eye problems.  Respiratory:  Negative for cough and shortness of breath.   Cardiovascular:  Negative for chest pain, leg swelling and palpitations.  Gastrointestinal:  Negative for abdominal pain, constipation, diarrhea, nausea and vomiting.  Genitourinary:  Negative for bladder incontinence, difficulty urinating, dysuria, frequency, hematuria and nocturia.   Musculoskeletal:  Negative for arthralgias (Left shoulder), back pain, flank pain, myalgias and neck pain.  Skin:  Negative for itching and rash.  Neurological:  Negative for dizziness, headaches and numbness.  Hematological:  Does not bruise/bleed easily.  Psychiatric/Behavioral:  Negative for depression, sleep disturbance and suicidal ideas. The patient is not nervous/anxious.   All other systems reviewed and are negative.    VITALS:   Blood pressure (!) 140/81, pulse 85, temperature (!) 97.4 F (36.3 C), resp. rate 19, height 5' 11.5" (1.816 m), weight 249 lb 1.6 oz (113 kg), SpO2 99 %.  Wt Readings from Last 3 Encounters:  01/12/23 249 lb 1.6 oz (113 kg)  08/14/22 263 lb 3.7 oz (119.4 kg)  08/10/22 256 lb 6.3 oz (116.3 kg)    Body mass index is 34.26 kg/m.  Performance status (ECOG): 1 -  Symptomatic but completely ambulatory  PHYSICAL EXAM:   Physical Exam Vitals and nursing note reviewed. Exam conducted with a chaperone present.  Constitutional:      Appearance: Normal appearance.  Cardiovascular:     Rate and Rhythm: Normal rate and regular rhythm.     Pulses: Normal pulses.     Heart sounds: Normal heart sounds.  Pulmonary:     Effort: Pulmonary effort is normal.     Breath sounds: Normal breath sounds.  Abdominal:     Palpations: Abdomen is  soft. There is no hepatomegaly, splenomegaly or mass.     Tenderness: There is no abdominal tenderness.  Musculoskeletal:     Right lower leg: No edema.     Left lower leg: No edema.  Lymphadenopathy:     Cervical: No cervical adenopathy.     Right cervical: No superficial, deep or posterior cervical adenopathy.    Left cervical: No superficial, deep or posterior cervical adenopathy.     Upper Body:     Right upper body: No supraclavicular or axillary adenopathy.     Left upper body: No supraclavicular or axillary adenopathy.  Neurological:     General: No focal deficit present.     Mental Status: He is alert and oriented to person, place, and time.  Psychiatric:        Mood and Affect: Mood normal.        Behavior: Behavior normal.     LABS:      Latest Ref Rng & Units 08/19/2022   11:00 AM 08/16/2022   12:50 PM 08/16/2022    6:07 AM  CBC  WBC 3.4 - 10.8 x10E3/uL 7.9   8.1   Hemoglobin 13.0 - 17.7 g/dL 8.9  8.0  8.3   Hematocrit 37.5 - 51.0 % 27.1  25.0  26.5   Platelets 150 - 450 x10E3/uL 287   203       Latest Ref Rng & Units 11/13/2022    9:13 AM 08/19/2022   11:00 AM 08/16/2022    6:07 AM  CMP  Glucose 70 - 99 mg/dL 161  096  045   BUN 8 - 27 mg/dL 19  22  16    Creatinine 0.76 - 1.27 mg/dL 4.09  8.11  9.14   Sodium 134 - 144 mmol/L 139  138  138   Potassium 3.5 - 5.2 mmol/L 4.8  4.3  3.9   Chloride 96 - 106 mmol/L 103  104  108   CO2 20 - 29 mmol/L 20  20  23    Calcium 8.6 - 10.2 mg/dL 9.3  8.8  8.3    Total Protein 6.0 - 8.5 g/dL 6.6     Total Bilirubin 0.0 - 1.2 mg/dL 0.4     Alkaline Phos 44 - 121 IU/L 95     AST 0 - 40 IU/L 95     ALT 0 - 44 IU/L 131        No results found for: "CEA1", "CEA" / No results found for: "CEA1", "CEA" No results found for: "PSA1" No results found for: "NWG956" No results found for: "CAN125"  No results found for: "TOTALPROTELP", "ALBUMINELP", "A1GS", "A2GS", "BETS", "BETA2SER", "GAMS", "MSPIKE", "SPEI" Lab Results  Component Value Date   TIBC 271 04/03/2009   FERRITIN 870 (H) 04/03/2009   IRONPCTSAT 72 (H) 04/03/2009   No results found for: "LDH"   STUDIES:   NM PET Image Initial (PI) Whole Body (F-18 FDG)  Result Date: 12/28/2022 CLINICAL DATA:  Subsequent treatment strategy for renal cell carcinoma post left nephrectomy in November 2023. EXAM: NUCLEAR MEDICINE PET WHOLE BODY TECHNIQUE: 13.2 mCi F-18 FDG was injected intravenously. Full-ring PET imaging was performed from the head to foot after the radiotracer. CT data was obtained and used for attenuation correction and anatomic localization. Fasting blood glucose: 114 mg/dl COMPARISON:  Chest CT 21/30/8657.  Abdominal CT 11/16/2022. FINDINGS: Mediastinal blood pool activity: SUV max 2.3 HEAD/ NECK: No hypermetabolic cervical lymph nodes are identified.There are hypermetabolic parotid nodules bilaterally, the largest superiorly  on the left measuring 9 mm on image 55/3 with an SUV max 7.1. No suspicious activity identified within the pharyngeal mucosal space. Incidental CT findings: Bilateral carotid atherosclerosis. CHEST: There are no hypermetabolic mediastinal, hilar or axillary lymph nodes. No hypermetabolic pulmonary activity or highly suspicious nodularity. Pulmonary assessment mildly limited by breathing artifact. There is a 4 mm perifissural nodule along the right major fissure on image 75/7 which is not clearly seen on prior CT, too small to optimally evaluate by PET-CT. Incidental CT  findings: Atherosclerosis of the aorta, great vessels and coronary arteries. Mild to moderate centrilobular and paraseptal emphysema with scattered pulmonary scarring. ABDOMEN/PELVIS: Enlarging hypermetabolic left adrenal metastasis, measuring 3.8 x 1.9 cm on image 188/3 with an SUV max of 14.4. Inferior to the left adrenal gland, there are 2 adjacent enlarging nodules in the left nephrectomy bed which are mildly hypermetabolic. The larger measures 2.1 x 1.0 cm on image 197/3 and has an SUV max of 4.7. No other suspicious hypermetabolic nodal activity in the abdomen or pelvis. Small inguinal lymph nodes bilaterally are likely reactive. There is no suspicious metabolic activity within the liver, pancreas, right adrenal gland or right kidney. Incidental CT findings: Postsurgical changes from previous left nephrectomy. Diffuse aortic and branch vessel atherosclerosis. SKELETON: There is a large hypermetabolic lytic lesion involving the distal left clavicle. This measures 4.3 x 2.3 cm on image 91/3 and has an SUV max of 13.5. In addition, there is a lytic hypermetabolic lesion involving the left temporal bone posterior to the mastoid air cells. This measures 2.0 x 1.0 cm on image 46/3 and has an SUV max of 8.6. The adjacent mastoid air cells and middle ear are clear. No other suspicious osseous lesions are seen. Incidental CT findings: Grossly stable multilevel spondylosis and chronic compression deformities in the lumbar spine. EXTREMITIES: No suspicious lesions within the extremities. Incidental CT findings: none IMPRESSION: 1. Enlarging hypermetabolic left adrenal metastasis. 2. Two small hypermetabolic soft tissue nodules in the left nephrectomy bed, highly suspicious for local recurrence. 3. Hypermetabolic lytic lesions involving the distal left clavicle and left temporal bone, consistent with osseous metastases. 4. Indeterminate small hypermetabolic parotid nodules bilaterally, potentially incidental benign  lesions such as Warthin's tumors. Given the proximity to the presumed left temporal bone osseous metastasis, metastatic disease not completely excluded. 5. Indeterminate, potentially new small perifissural right lung nodule, too small to evaluate by PET-CT. Attention on follow-up CT recommended. 6. Aortic Atherosclerosis (ICD10-I70.0) and Emphysema (ICD10-J43.9). Electronically Signed   By: Carey Bullocks M.D.   On: 12/28/2022 09:39

## 2023-01-12 NOTE — Telephone Encounter (Signed)
Oral Oncology Pharmacist Encounter  Received new prescription for Lenvima (lenvatinib) for the treatment of metastatic clear cell RCC in conjunction with pembrolizumab, planned duration until disease progression or unacceptable drug toxicity.  CMP from 11/13/22 assessed, patient has a history of elevated LFTs, continue to monitor, if he has severe hepatic impairment in the future, he may require a dose reduction. Prescription dose and frequency assessed. MD starting patient on a reduced dose.  Current medication list in Epic reviewed, no DDIs with lenvatinib identified.   Evaluated chart and no patient barriers to medication adherence identified.   Prescription has been e-scribed to the [facility] for benefits analysis and approval.  Oral Oncology Clinic will continue to follow for insurance authorization, copayment issues, initial counseling and start date.   Remi Haggard, PharmD, BCPS, BCOP, CPP Hematology/Oncology Clinical Pharmacist Practitioner [place]/DB/AP Oral Chemotherapy Navigation Clinic [phone]  01/12/2023 3:00 PM

## 2023-01-12 NOTE — Progress Notes (Signed)
START OFF PATHWAY REGIMEN - Renal Cell   [id number]:Pembrolizumab 200 mg IV D1 q21 Days:   A cycle is every 21 days:     Pembrolizumab   **Always confirm dose/schedule in your pharmacy ordering system**  Patient Characteristics: Stage IV (Unresected T4M0 or Any T, M1)/Metastatic Disease, Clear Cell, First Line, Intermediate or Poor Risk Therapeutic Status: Stage IV (Unresected T4M0 or Any T, M1)/Metastatic Disease Histology: Clear Cell Line of Therapy: First Line Risk Status: Intermediate Risk Intent of Therapy: Non-Curative / Palliative Intent, Discussed with Patient 

## 2023-01-12 NOTE — Telephone Encounter (Signed)
4/30 Patient has decided to seek care at [facility]. He has transportation issues and needs care locally -per Kennith Gain, RN Onc Nurse Nav.

## 2023-01-12 NOTE — Telephone Encounter (Signed)
See below

## 2023-01-13 ENCOUNTER — Other Ambulatory Visit (HOSPITAL_COMMUNITY): Payer: Self-pay

## 2023-01-13 ENCOUNTER — Other Ambulatory Visit: Payer: Self-pay

## 2023-01-13 ENCOUNTER — Telehealth: Payer: Self-pay

## 2023-01-13 DIAGNOSIS — C642 Malignant neoplasm of left kidney, except renal pelvis: Secondary | ICD-10-CM | POA: Diagnosis not present

## 2023-01-13 DIAGNOSIS — Z510 Encounter for antineoplastic radiation therapy: Secondary | ICD-10-CM | POA: Diagnosis not present

## 2023-01-13 DIAGNOSIS — C7951 Secondary malignant neoplasm of bone: Secondary | ICD-10-CM | POA: Diagnosis not present

## 2023-01-13 NOTE — Telephone Encounter (Signed)
Oral Oncology Patient Advocate Encounter  Prior Authorization for [name] has been approved.    PA# [id number]  Effective dates: 09/14/22 through 09/14/23  Patients co-pay is $11.20.    Ardeen Fillers, CPhT Oncology Pharmacy Patient Advocate  [facility]  [phone] (phone) [phone] (fax) 01/13/2023 8:34 AM

## 2023-01-13 NOTE — Telephone Encounter (Signed)
Oral Oncology Patient Advocate Encounter  New authorization   Received notification that prior authorization for Lenvima is required.   PA submitted on 01/13/23  Key [id number]  Status is pending     Ardeen Fillers, CPhT Oncology Pharmacy Patient Advocate  [facility]  [phone] (phone) [phone] (fax) 01/13/2023 8:25 AM

## 2023-01-14 ENCOUNTER — Other Ambulatory Visit: Payer: Self-pay

## 2023-01-14 ENCOUNTER — Other Ambulatory Visit (HOSPITAL_COMMUNITY): Payer: Self-pay

## 2023-01-14 ENCOUNTER — Ambulatory Visit: Payer: PRIVATE HEALTH INSURANCE

## 2023-01-14 ENCOUNTER — Ambulatory Visit: Payer: PRIVATE HEALTH INSURANCE | Admitting: Radiation Oncology

## 2023-01-14 DIAGNOSIS — Z510 Encounter for antineoplastic radiation therapy: Secondary | ICD-10-CM | POA: Diagnosis not present

## 2023-01-14 DIAGNOSIS — C642 Malignant neoplasm of left kidney, except renal pelvis: Secondary | ICD-10-CM | POA: Diagnosis not present

## 2023-01-14 DIAGNOSIS — C7951 Secondary malignant neoplasm of bone: Secondary | ICD-10-CM | POA: Diagnosis not present

## 2023-01-14 NOTE — Telephone Encounter (Signed)
Patient successfully OnBoarded and drug education provided by pharmacist. [name] scheduled to be shipped on 01/15/23 for delivery on 01/18/23 from [facility] to patient's address. Patient knows to call me at [phone] with any questions or concerns regarding receiving medication or if there are any unexpected changes in co-pay.    Ardeen Fillers, CPhT Oncology Pharmacy Patient Advocate  [facility]  [phone] (phone) [phone] (fax) 01/14/2023 10:49 AM

## 2023-01-14 NOTE — Telephone Encounter (Signed)
Oral Chemotherapy Pharmacist Encounter  [facility] to deliver medication to patient on 01/18/23.  Patient Education I spoke with patient for overview of new oral chemotherapy medication: Lenvima (lenvatinib) for the treatment of metastatic clear cell RCC in conjunction with pembrolizumab, planned duration until disease progression or unacceptable drug toxicity.   Counseled patient on administration, dosing, side effects, monitoring, drug-food interactions, safe handling, storage, and disposal. Patient will take 14 mg by mouth daily. (Take one 10mg  capsule and one 4mg  capsule for a total dose of 14mg ) .  Side effects include but not limited to: hypertension, diarrhea, fatigue, nausea, mouth sores, hand foot syndrome.   Hypertension: Reviewed s/sx of hypertension Diarrhea: Patient knows that both the pembrolizumab and lenvatinib can cause diarrhea, but would be treated differently. He can start loperamide at home but was instructed to still call the office to report diarrhea Hand-foot syndrome: patient knows to reports skin changes on his hand or feet  Reviewed with patient importance of keeping a medication schedule and plan for any missed doses.  After discussion with patient no patient barriers to medication adherence identified.   Mr. Shatto voiced understanding and appreciation. All questions answered. Medication handout provided.  Provided patient with Oral Chemotherapy Navigation Clinic phone number. Patient knows to call the office with questions or concerns. Oral Chemotherapy Navigation Clinic will continue to follow.  Remi Haggard, PharmD, BCPS, BCOP, CPP Hematology/Oncology Clinical Pharmacist Practitioner [place]/[facility]/AP Oral Chemotherapy Navigation Clinic [phone]  01/14/2023 10:33 AM

## 2023-01-15 ENCOUNTER — Other Ambulatory Visit: Payer: Self-pay

## 2023-01-18 ENCOUNTER — Other Ambulatory Visit: Payer: Self-pay

## 2023-01-18 DIAGNOSIS — C642 Malignant neoplasm of left kidney, except renal pelvis: Secondary | ICD-10-CM | POA: Diagnosis not present

## 2023-01-18 DIAGNOSIS — Z510 Encounter for antineoplastic radiation therapy: Secondary | ICD-10-CM | POA: Diagnosis not present

## 2023-01-18 DIAGNOSIS — C7951 Secondary malignant neoplasm of bone: Secondary | ICD-10-CM | POA: Diagnosis not present

## 2023-01-18 NOTE — Patient Instructions (Addendum)
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 You will be treated in the clinic every 3 weeks with an immunotherapy drug called Keytruda. The intent of treatment is to shrink and control  the cancer, prevent it from spreading further, and to alleviate any symptoms you're having related to the cancer. You will see the doctor regularly throughout treatment.  We will obtain blood work from you prior to every treatment and monitor your results to make sure it is safe to give your treatment. The doctor monitors your response to treatment by the way you are feeling, your blood work, and by obtaining scans periodically. There will be wait times while you are here for treatment.  It will take about 30 minutes to 1 hour for your lab work to result.  Then there will be wait times while pharmacy mixes your medications.    Pembrolizumab Rande Lawman)  About This Drug Pembrolizumab is used to treat cancer. It is given in the vein (IV).  This drug will take 30 minutes to infuse.  Possible Side Effects  Tiredness  Fever  Nausea  Decreased appetite (decreased hunger)  Loose bowel movements (diarrhea)  Constipation (not able to move bowels)  Trouble breathing  Rash  Itching  Muscle and bone pain  Cough  Note: Each of the side effects above was reported in 20% or greater of patients treated with pembrolizumab. Not all possible side effects are included above.  Warnings and Precautions   This drug works with your immune system and can cause inflammation in any of your organs and tissues and can change how they work. This may put you at risk for developing serious medical problems which can very rarely be fatal.   Colitis (swelling or inflammation in the colon) - symptoms are loose bowel movements (diarrhea) stomach cramping, and sometimes blood in the stool   Changes in liver function. Your liver function will be  checked as needed.   Changes in kidney function, which can very rarely be fatal. Your kidney function will be checked as needed.   Inflammation (swelling) of the lungs which can very rarely be fatal - you may have a dry cough or trouble breathing.   This drug may affect some of your hormone glands (especially the thyroid, adrenals, pituitary and pancreas). Your hormone levels will be checked as needed.   Blood sugar levels may change and you may develop diabetes. If you already have diabetes, changes may need to be made to your diabetes medication.   Severe allergic skin reaction, which can very rarely be fatal. You may develop blisters on your skin that are filled with fluid or a severe red rash all over your body that may be painful.   Increased risk of organ rejection in patients who have received donor organs   Increased risk of complications in patients who will undergo a stem cell transplant after receiving pembrolizumab.   While you are getting this drug in your vein (IV), you may have a reaction to the drug. Your nurse will check you closely for these signs: fever or shaking chills, flushing, facial swelling, feeling dizzy, headache, trouble breathing, rash, itching, chest tightness, or chest pain. These reactions may occur after your infusion. If this happens, call 911 for emergency care.  Important Information This drug may be present in the saliva, tears, sweat, urine, stool, vomit, semen, and vaginal secretions. Talk to your doctor and/or your nurse about  the necessary precautions to take during this time.  Treating Side Effects   Ask your doctor or nurse about medicines that are available to help stop or lessen constipation, diarrhea and/or nausea.   Drink plenty of fluids (a minimum of eight glasses per day is recommended).   If you are not able to move your bowels, check with your doctor or nurse before you use any enemas, laxatives, or suppositories   To help with nausea  and vomiting, eat small, frequent meals instead of three large meals a day. Choose foods and drinks that are at room temperature. Ask your nurse or doctor about other helpful tips and medicine that is available to help or stop lessen these symptoms.   If you get diarrhea, eat low-fiber foods that are high in protein and calories and avoid foods that can irritate your digestive tracts or lead to cramping. Ask your nurse or doctor about medicine that can lessen or stop your diarrhea.   Manage tiredness by pacing your activities for the day. Be sure to include periods of rest between energy-draining activities   Keeping your pain under control is important to your wellbeing. Please tell your doctor or nurse if you are experiencing pain.   If you have diabetes, keep good control of your blood sugar level. Tell your nurse or your doctor if your glucose levels are higher or lower than normal   If you get a rash do not put anything on it unless your doctor or nurse says you may. Keep the area around the rash clean and dry. Ask your doctor for medicine if your rash bothers you.   Infusion reactions may happen for 24 hours after your infusion. If this happens, call 911 for emergency care.  Food and Drug Interactions   There are no known interactions of pembrolizumab with food.   There are no known interactions of pembrolizumab with other medications.   Tell your doctor and pharmacist about all the medicines and dietary supplements (vitamins, minerals, herbs and others) that you are taking at this time. The safety and use of dietary supplements and alternative agents are often not known. Using these might affect your cancer or interfere with your treatment. Until more is known, you should not use dietary supplements or alternative agents without your cancer doctor's help.   When to Call the Doctor Call your doctor or nurse if you have any of the following symptoms and/or any new or unusual  symptoms:   Fever of 100.4 F (38 C) or higher   Chills   Wheezing or trouble breathing   Rash or itching   Feeling dizzy or lightheaded   Loose bowel movements (diarrhea) more than 4 times a day or diarrhea with weakness or lightheadedness, or diarrhea that is not controlled by medications   Nausea that stops you from eating or drinking, and/or that is not relieved by prescribed medicines   Lasting loss of appetite or rapid weight loss of five pounds in a week   Fatigue that interferes with your daily activities   No bowel movement for 3 days or you feel uncomfortable   Extreme weakness that interferes with normal activities   Bad abdominal pain, especially in upper right area   Decreased urine   Unusual thirst or passing urine often   Rash that is not relieved by prescribed medicines   Flu-like symptoms: fever, headache, muscle and joint aches, and fatigue (low energy, feeling weak)   Signs of liver problems: dark urine,  pale bowel movements, bad stomach pain, feeling very tired and weak, unusual itching, or yellowing of the eyes or skin   Signs of infusion reactions such as fever or shaking chills, flushing, facial swelling, feeling dizzy, headache, trouble breathing, rash, itching, chest tightness, or chest pain.   Reproduction Warnings   Pregnancy warning: This drug may have harmful effects on the unborn baby. Women of childbearing potential should use effective methods of birth control during your cancer treatment and for at least 4 months after treatment. Let your doctor know right away if you think you may be pregnant   Breast feeding warning: It is not known if this drug passes into breast milk. It is recommended that women do not breastfeed during treatment and for 4 months after treatment.   Fertility warning: Human fertility studies have not been done with this drug. Talk with your doctor or nurse if you plan to have children.   SELF CARE ACTIVITIES WHILE ON  CHEMOTHERAPY/IMMUNOTHERAPY:  Hydration Increase your fluid intake and drink at least 64 ounces (2 liters) of water/decaffeinated beverages per day after treatment. You can still have your cup of coffee or soda but these beverages do not count as part of the 64 ounces that you need to drink daily. Limit alcohol intake.  Medications Continue taking your normal prescription medication as prescribed.  If you start any new herbal or new supplements please let us know first to make sure it is safe.  Mouth Care Have teeth cleaned professionally before starting treatment. Keep dentures and partial plates clean. Use soft toothbrush and do not use mouthwashes that contain alcohol. Biotene is a good mouthwash that is available at most pharmacies or may be ordered by calling [phone]. Use warm salt water gargles (1 teaspoon salt per 1 quart warm water) before and after meals and at bedtime. If you are still having problems with your mouth or sores in your mouth please call the clinic. If you need dental work, please let the doctor know before you go for your appointment so that we can coordinate the best possible time for you in regards to your chemo regimen. You need to also let your dentist know that you are actively taking chemo. We may need to do labs prior to your dental appointment.  Skin Care Always use sunscreen that has not expired and with SPF (Sun Protection Factor) of 50 or higher. Wear hats to protect your head from the sun. Remember to use sunscreen on your hands, ears, face, & feet.  Use good moisturizing lotions such as udder cream, eucerin, or even Vaseline. Some chemotherapies can cause dry skin, color changes in your skin and nails.    Avoid long, hot showers or baths. Use gentle, fragrance-free soaps and laundry detergent. Use moisturizers, preferably creams or ointments rather than lotions because the thicker consistency is better at preventing skin dehydration. Apply the cream or  ointment within 15 minutes of showering. Reapply moisturizer at night, and moisturize your hands every time after you wash them.   Infection Prevention Please wash your hands for at least 30 seconds using warm soapy water. Handwashing is the #1 way to prevent the spread of germs. Stay away from sick people or people who are getting over a cold. If you develop respiratory systems such as green/yellow mucus production or productive cough or persistent cough let us know and we will see if you need an antibiotic. It is a good idea to keep a pair of gloves on  when going into grocery stores/[employer] to decrease your risk of coming into contact with germs on the carts, etc. Carry alcohol hand gel with you at all times and use it frequently if out in public. If your temperature reaches 100.5 or higher please call the clinic and let us know.  If it is after hours or on the weekend please go to the ER if your temperature is over 100.4.  Please have your own personal thermometer at home to use.    Sex and bodily fluids If you are going to have sex, a condom must be used to protect the person that isn't taking immunotherapy. For a few days after treatment, immunotherapy can be excreted through your bodily fluids.  When using the toilet please close the lid and flush the toilet twice.  Do this for a few day after you have had immunotherapy.   Contraception It is not known for sure whether or not immunotherapy drugs can be passed on through semen or secretions from the vagina. Because of this some doctors advise people to use a barrier method if you have sex during treatment. This applies to vaginal, anal or oral sex.  Generally, doctors advise a barrier method only for the time you are actually having the treatment and for about a week after your treatment.  Advice like this can be worrying, but this does not mean that you have to avoid being intimate with your partner. You can still have close contact with your  partner and continue to enjoy sex.  Animals If you have cats or birds we ask that you not change the litter or change the cage.  Please have someone else do this for you while you are on immunotherapy.   Food Safety During and After Cancer Treatment Food safety is important for people both during and after cancer treatment. Cancer and cancer treatments, such as chemotherapy, radiation therapy, and stem cell/bone marrow transplantation, often weaken the immune system. This makes it harder for your body to protect itself from foodborne illness, also called food poisoning. Foodborne illness is caused by eating food that contains harmful bacteria, parasites, or viruses.  Foods to avoid Some foods have a higher risk of becoming tainted with bacteria. These include: Unwashed fresh fruit and vegetables, especially leafy vegetables that can hide dirt and other contaminants Raw sprouts, such as alfalfa sprouts Raw or undercooked beef, especially ground beef, or other raw or undercooked meat and poultry Fatty, fried, or spicy foods immediately before or after treatment.  These can sit heavy on your stomach and make you feel nauseous. Raw or undercooked shellfish, such as oysters. Sushi and sashimi, which often contain raw fish.  Unpasteurized beverages, such as unpasteurized fruit juices, raw milk, raw yogurt, or cider Undercooked eggs, such as soft boiled, over easy, and poached; raw, unpasteurized eggs; or foods made with raw egg, such as homemade raw cookie dough and homemade mayonnaise  Simple steps for food safety  Shop smart. Do not buy food stored or displayed in an unclean area. Do not buy bruised or damaged fruits or vegetables. Do not buy cans that have cracks, dents, or bulges. Pick up foods that can spoil at the end of your shopping trip and store them in a cooler on the way home.  Prepare and clean up foods carefully. Rinse all fresh fruits and vegetables under running water, and dry  them with a clean towel or paper towel. Clean the top of cans before opening them. After preparing  food, wash your hands for 20 seconds with hot water and soap. Pay special attention to areas between fingers and under nails. Clean your utensils and dishes with hot water and soap. Disinfect your kitchen and cutting boards using 1 teaspoon of liquid, unscented bleach mixed into 1 quart of water.    Dispose of old food. Eat canned and packaged food before its expiration date (the "use by" or "best before" date). Consume refrigerated leftovers within 3 to 4 days. After that time, throw out the food. Even if the food does not smell or look spoiled, it still may be unsafe. Some bacteria, such as Listeria, can grow even on foods stored in the refrigerator if they are kept for too long.  Take precautions when eating out. At restaurants, avoid buffets and salad bars where food sits out for a long time and comes in contact with many people. Food can become contaminated when someone with a virus, often a norovirus, or another "bug" handles it. Put any leftover food in a "to-go" container yourself, rather than having the server do it. And, refrigerate leftovers as soon as you get home. Choose restaurants that are clean and that are willing to prepare your food as you order it cooked.    SYMPTOMS TO REPORT AS SOON AS POSSIBLE AFTER TREATMENT:  FEVER GREATER THAN 100.4 F CHILLS WITH OR WITHOUT FEVER NAUSEA AND VOMITING THAT IS NOT CONTROLLED WITH YOUR NAUSEA MEDICATION UNUSUAL SHORTNESS OF BREATH UNUSUAL BRUISING OR BLEEDING TENDERNESS IN MOUTH AND THROAT WITH OR WITHOUT PRESENCE OF ULCERS URINARY PROBLEMS BOWEL PROBLEMS UNUSUAL RASH     Wear comfortable clothing and clothing appropriate for easy access to any Portacath or PICC line. Let us know if there is anything that we can do to make your therapy better!   What to do if you need assistance after hours or on the weekends: CALL [phone].   HOLD on the line, do not hang up.  You will hear multiple messages but at the end you will be connected with a nurse triage line.  They will contact the doctor if necessary.  Most of the time they will be able to assist you.  Do not call the hospital operator.    I have been informed and understand all of the instructions given to me and have received a copy. I have been instructed to call the clinic [phone] or my family physician as soon as possible for continued medical care, if indicated. I do not have any more questions at this time but understand that I may call the Cancer Center or the Patient Navigator at [phone] during office hours should I have questions or need assistance in obtaining follow-up care.

## 2023-01-20 ENCOUNTER — Inpatient Hospital Stay: Payer: Medicare PPO | Attending: Hematology

## 2023-01-20 ENCOUNTER — Ambulatory Visit: Payer: PRIVATE HEALTH INSURANCE | Admitting: Hematology

## 2023-01-20 ENCOUNTER — Inpatient Hospital Stay: Payer: Medicare PPO

## 2023-01-20 DIAGNOSIS — I10 Essential (primary) hypertension: Secondary | ICD-10-CM | POA: Diagnosis not present

## 2023-01-20 DIAGNOSIS — Z905 Acquired absence of kidney: Secondary | ICD-10-CM | POA: Insufficient documentation

## 2023-01-20 DIAGNOSIS — C642 Malignant neoplasm of left kidney, except renal pelvis: Secondary | ICD-10-CM

## 2023-01-20 DIAGNOSIS — Z5112 Encounter for antineoplastic immunotherapy: Secondary | ICD-10-CM | POA: Diagnosis not present

## 2023-01-20 DIAGNOSIS — M25512 Pain in left shoulder: Secondary | ICD-10-CM | POA: Diagnosis not present

## 2023-01-20 LAB — COMPREHENSIVE METABOLIC PANEL
ALT: 56 U/L — ABNORMAL HIGH (ref 0–44)
AST: 51 U/L — ABNORMAL HIGH (ref 15–41)
Albumin: 4 g/dL (ref 3.5–5.0)
Alkaline Phosphatase: 94 U/L (ref 38–126)
Anion gap: 8 (ref 5–15)
BUN: 24 mg/dL — ABNORMAL HIGH (ref 8–23)
CO2: 22 mmol/L (ref 22–32)
Calcium: 9 mg/dL (ref 8.9–10.3)
Chloride: 105 mmol/L (ref 98–111)
Creatinine, Ser: 1.17 mg/dL (ref 0.61–1.24)
GFR, Estimated: >60 mL/min (ref >60)
Glucose, Bld: 108 mg/dL — ABNORMAL HIGH (ref 70–99)
Potassium: 4.3 mmol/L (ref 3.5–5.1)
Sodium: 135 mmol/L (ref 135–145)
Total Bilirubin: 1 mg/dL (ref 0.3–1.2)
Total Protein: 7.7 g/dL (ref 6.5–8.1)

## 2023-01-20 LAB — CBC WITH DIFFERENTIAL/PLATELET
Abs Immature Granulocytes: 0.05 10*3/uL (ref 0.00–0.07)
Basophils Absolute: 0 10*3/uL (ref 0.0–0.1)
Basophils Relative: 1 %
Eosinophils Absolute: 0.2 10*3/uL (ref 0.0–0.5)
Eosinophils Relative: 3 %
HCT: 34.2 % — ABNORMAL LOW (ref 39.0–52.0)
Hemoglobin: 11.3 g/dL — ABNORMAL LOW (ref 13.0–17.0)
Immature Granulocytes: 1 %
Lymphocytes Relative: 62 %
Lymphs Abs: 5.2 10*3/uL — ABNORMAL HIGH (ref 0.7–4.0)
MCH: 33 pg (ref 26.0–34.0)
MCHC: 33 g/dL (ref 30.0–36.0)
MCV: 100 fL (ref 80.0–100.0)
Monocytes Absolute: 1.4 10*3/uL — ABNORMAL HIGH (ref 0.1–1.0)
Monocytes Relative: 17 %
Neutro Abs: 1.3 10*3/uL — ABNORMAL LOW (ref 1.7–7.7)
Neutrophils Relative %: 16 %
Platelets: 233 10*3/uL (ref 150–400)
RBC: 3.42 MIL/uL — ABNORMAL LOW (ref 4.22–5.81)
RDW: 18.7 % — ABNORMAL HIGH (ref 11.5–15.5)
WBC: 8.3 10*3/uL (ref 4.0–10.5)
nRBC: 0 % (ref 0.0–0.2)

## 2023-01-20 LAB — LACTATE DEHYDROGENASE: LDH: 248 U/L — ABNORMAL HIGH (ref 98–192)

## 2023-01-20 LAB — FERRITIN: Ferritin: 655 ng/mL — ABNORMAL HIGH (ref 24–336)

## 2023-01-20 LAB — IRON AND TIBC
Iron: 66 ug/dL (ref 45–182)
Saturation Ratios: 26 % (ref 17.9–39.5)
TIBC: 250 ug/dL (ref 250–450)
UIBC: 184 ug/dL

## 2023-01-20 LAB — MAGNESIUM: Magnesium: 2.1 mg/dL (ref 1.7–2.4)

## 2023-01-20 LAB — TSH: TSH: 5.04 u[IU]/mL — ABNORMAL HIGH (ref 0.350–4.500)

## 2023-01-20 NOTE — Progress Notes (Signed)
Immunotherapy education packet given and discussed with pt in detail.  Discussed diagnosis, staging, tx regimen, and intent of tx.  Reviewed immunotherapy medications and side effects.  Instructed on how to manage side effects at home, and when to call the clinic.  Importance of fever/chills discussed with pt and family. Discussed precautions to implement at home after receiving tx, as well as self care strategies. Phone numbers provided for clinic during regular working hours, also how to reach the clinic after hours and on weekends. Pt provided the opportunity to ask questions - all questions answered to pt's satisfaction.     

## 2023-01-21 ENCOUNTER — Encounter: Payer: Self-pay | Admitting: Surgery

## 2023-01-21 ENCOUNTER — Ambulatory Visit: Payer: Medicare PPO | Admitting: Surgery

## 2023-01-21 VITALS — BP 134/78 | HR 106 | Temp 97.6°F | Resp 18 | Ht 71.5 in | Wt 242.0 lb

## 2023-01-21 DIAGNOSIS — C642 Malignant neoplasm of left kidney, except renal pelvis: Secondary | ICD-10-CM

## 2023-01-21 NOTE — Progress Notes (Signed)
[facility] History and Physical  Reason for Referral: Port-A-Cath insertion Referring Physician: Oncology referral  Chief Complaint   New Patient (Initial Visit)     David [name] is a 72 y.o. male.  HPI: Patient presents to discuss Port-A-Cath insertion.  He was diagnosed with kidney cancer in September 2023.  He subsequently underwent left nephrectomy in November 2023.  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  He denies ever having any neck procedures in the past.  He has no other significant contributory past medical history.  His surgical history includes nephrectomy and tonsillectomy.  He denies use of blood thinning medications.  He smokes 1/2 pack of cigarettes per day.  He denies use of alcohol and illicit drugs.  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 Ronal Fear note   Pneumonia    Seizure [facility])     Past Surgical History:  Procedure Laterality Date   COLONOSCOPY  05/31/2012   Procedure: COLONOSCOPY;  Surgeon: Dalia Heading, MD;  [facility]: [facility];  Service: Gastroenterology;  Laterality: N/A;   ROBOT ASSISTED LAPAROSCOPIC NEPHRECTOMY Left 08/13/2022   Procedure: XI ROBOTIC ASSISTED LAPAROSCOPIC NEPHRECTOMY;  Surgeon: Malen Gauze, MD;  Location: [facility] ORS;  Service: Urology;  Laterality: Left;   TONSILLECTOMY      No family history on file.  Social History   Tobacco Use   Smoking status: Every Day    Packs/day: .5    Types: Cigarettes   Smokeless tobacco: Never  Vaping Use   Vaping Use: Never used  Substance Use Topics   Alcohol use: No   Drug use: No    Medications: I have reviewed the patient's current medications. Allergies as of 01/21/2023   No Known Allergies      Medication List        Accurate as of Jan 21, 2023  1:50  PM. If you have any questions, ask your nurse or doctor.          KEYTRUDA IV Inject into the vein every 21 ( twenty-one) days. Start taking on: Jan 22, 2023   Lenvima (14 MG Daily Dose) 10 & 4 MG capsule Generic drug: lenvatinib 14 mg daily dose Take 14 mg by mouth daily. (Take one 10mg  capsule and one 4mg  capsule for a total dose of 14mg )   tamsulosin 0.4 MG Caps capsule Commonly known as: FLOMAX Take 0.4 mg by mouth daily.         ROS:  Constitutional: negative for chills, fatigue, and fevers Eyes: negative for visual disturbance and pain Ears, nose, mouth, throat, and face: negative for ear drainage, sore throat, and sinus problems Respiratory: negative for cough, wheezing, and shortness of breath Cardiovascular: negative for chest pain and palpitations Gastrointestinal: negative for abdominal pain, nausea, reflux symptoms, and vomiting Genitourinary:negative for dysuria and frequency Integument/breast: negative for dryness and rash Hematologic/lymphatic: negative for bleeding and lymphadenopathy Musculoskeletal:negative for back pain and neck pain Neurological: negative for dizziness and tremors Endocrine: negative for temperature intolerance  Blood pressure 134/78, pulse (!) 106, temperature 97.6 F (36.4 C), temperature source Other (Comment), resp. rate 18, height 5' 11.5" (1.816 m), weight 242 lb (109.8 kg), SpO2 98 %. Physical Exam Vitals reviewed.  Constitutional:      Appearance: Normal appearance.  HENT:     Head: Normocephalic and atraumatic.  Eyes:     Extraocular Movements: Extraocular movements intact.     Pupils: Pupils are equal, round, and reactive to light.  Cardiovascular:     Rate and Rhythm: Normal rate and regular rhythm.  Pulmonary:     Effort: Pulmonary effort is normal.     Breath sounds: Normal breath sounds.  Abdominal:     General: There is no distension.     Palpations: Abdomen is soft.     Tenderness: There is no abdominal  tenderness.  Musculoskeletal:        General: Normal range of motion.     Cervical back: Normal range of motion. No rigidity.  Lymphadenopathy:     Cervical: No cervical adenopathy.  Neurological:     General: No focal deficit present.     Mental Status: He is alert and oriented to person, place, and time.  Psychiatric:        Mood and Affect: Mood normal.        Behavior: Behavior normal.     Results: Results for orders placed or performed in visit on 01/20/23 (from the past 48 hour(s))  Iron and TIBC ([facility] ONLY)     Status: None   Collection Time: 01/20/23  1:12 PM  Result Value Ref Range   Iron 66 45 - 182 ug/dL   TIBC 782 956 - 213 ug/dL   Saturation Ratios 26 17.9 - 39.5 %   UIBC 184 ug/dL    Comment: Performed at [facility], [address] [place], [place] [address]  Ferritin     Status: Abnormal   Collection Time: 01/20/23  1:12 PM  Result Value Ref Range   Ferritin 655 (H) 24 - 336 ng/mL    Comment: Performed at [facility], [address] [place], [place] [address]  TSH     Status: Abnormal   Collection Time: 01/20/23  1:12 PM  Result Value Ref Range   TSH 5.040 (H) 0.350 - 4.500 uIU/mL    Comment: Performed by a 3rd Generation assay with a functional sensitivity of <=0.01 uIU/mL. Performed at [facility], [address] [place], [place] [address]   Lactate dehydrogenase     Status: Abnormal   Collection Time: 01/20/23  1:13 PM  Result Value Ref Range   LDH 248 (H) 98 - 192 U/L    Comment: Performed at [facility], [address] [place], [place] [address]  Comprehensive metabolic panel     Status: Abnormal   Collection Time: 01/20/23  1:13 PM  Result Value Ref Range   Sodium 135 135 - 145 mmol/L   Potassium 4.3 3.5 - 5.1 mmol/L   Chloride 105 98 - 111 mmol/L   CO2 22 22 - 32 mmol/L   Glucose, Bld 108 (H) 70 - 99 mg/dL    Comment: Glucose reference range applies only to samples taken after fasting for at least 8 hours.   BUN 24  (H) 8 - 23 mg/dL   Creatinine, Ser 4.01 0.61 - 1.24 mg/dL   Calcium 9.0 8.9 - 02.7 mg/dL   Total Protein 7.7 6.5 - 8.1 g/dL   Albumin 4.0 3.5 - 5.0 g/dL   AST 51 (H) 15 - 41 U/L   ALT 56 (H) 0 - 44 U/L   Alkaline Phosphatase 94 38 - 126 U/L   Total Bilirubin 1.0 0.3 - 1.2 mg/dL   GFR, Estimated >25 >36 mL/min    Comment: (  NOTE) Calculated using the CKD-EPI Creatinine Equation (2021)    Anion gap 8 5 - 15    Comment: Performed at [facility], [address] [place], [place] [address]  CBC with Differential     Status: Abnormal   Collection Time: 01/20/23  1:13 PM  Result Value Ref Range   WBC 8.3 4.0 - 10.5 K/uL   RBC 3.42 (L) 4.22 - 5.81 MIL/uL   Hemoglobin 11.3 (L) 13.0 - 17.0 g/dL   HCT 32.4 (L) 40.1 - 02.7 %   MCV 100.0 80.0 - 100.0 fL   MCH 33.0 26.0 - 34.0 pg   MCHC 33.0 30.0 - 36.0 g/dL   RDW 25.3 (H) 66.4 - 40.3 %   Platelets 233 150 - 400 K/uL   nRBC 0.0 0.0 - 0.2 %   Neutrophils Relative % 16 %   Neutro Abs 1.3 (L) 1.7 - 7.7 K/uL   Lymphocytes Relative 62 %   Lymphs Abs 5.2 (H) 0.7 - 4.0 K/uL   Monocytes Relative 17 %   Monocytes Absolute 1.4 (H) 0.1 - 1.0 K/uL   Eosinophils Relative 3 %   Eosinophils Absolute 0.2 0.0 - 0.5 K/uL   Basophils Relative 1 %   Basophils Absolute 0.0 0.0 - 0.1 K/uL   Immature Granulocytes 1 %   Abs Immature Granulocytes 0.05 0.00 - 0.07 K/uL    Comment: Performed at [facility], [address] [place], [place] [address]  Magnesium     Status: None   Collection Time: 01/20/23  1:13 PM  Result Value Ref Range   Magnesium 2.1 1.7 - 2.4 mg/dL    Comment: Performed at [facility], [address], [place], [place] [address]    No results found.   Assessment & Plan:  David [name] is a 72 y.o. male who presents to discuss Port-A-Cath insertion.  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  After careful consideration, David [name] has decided to proceed with surgery.  -Patient tentatively scheduled for surgery on 5/15 -Information provided to the patient regarding Port-A-Cath  All questions were answered to the satisfaction of the patient.  Theophilus Kinds, DO [facility] [address] [place], [place] [address] [phone] (office)

## 2023-01-22 ENCOUNTER — Inpatient Hospital Stay: Payer: Medicare PPO

## 2023-01-22 VITALS — BP 130/80 | HR 67 | Temp 98.2°F | Resp 18 | Wt 241.2 lb

## 2023-01-22 DIAGNOSIS — Z905 Acquired absence of kidney: Secondary | ICD-10-CM | POA: Diagnosis not present

## 2023-01-22 DIAGNOSIS — Z5112 Encounter for antineoplastic immunotherapy: Secondary | ICD-10-CM | POA: Diagnosis not present

## 2023-01-22 DIAGNOSIS — M25512 Pain in left shoulder: Secondary | ICD-10-CM | POA: Diagnosis not present

## 2023-01-22 DIAGNOSIS — C642 Malignant neoplasm of left kidney, except renal pelvis: Secondary | ICD-10-CM | POA: Diagnosis not present

## 2023-01-22 DIAGNOSIS — I10 Essential (primary) hypertension: Secondary | ICD-10-CM | POA: Diagnosis not present

## 2023-01-22 LAB — T4: T4, Total: 6.2 ug/dL (ref 4.5–12.0)

## 2023-01-22 MED ORDER — PROCHLORPERAZINE MALEATE 10 MG PO TABS
10.0000 mg | ORAL_TABLET | Freq: Four times a day (QID) | ORAL | 1 refills | Status: DC | PRN
Start: 2023-01-22 — End: 2023-11-29

## 2023-01-22 MED ORDER — LIDOCAINE-PRILOCAINE 2.5-2.5 % EX CREA
TOPICAL_CREAM | CUTANEOUS | 3 refills | Status: DC
Start: 2023-01-22 — End: 2023-10-25

## 2023-01-22 MED ORDER — SODIUM CHLORIDE 0.9 % IV SOLN: Freq: Once | INTRAVENOUS | Status: AC

## 2023-01-22 MED ORDER — SODIUM CHLORIDE 0.9 % IV SOLN
200.0000 mg | Freq: Once | INTRAVENOUS | Status: AC
  Administered 2023-01-22: 200 mg via INTRAVENOUS
  Filled 2023-01-22: qty 8

## 2023-01-22 NOTE — Progress Notes (Signed)
Pharmacist Chemotherapy Monitoring - Initial Assessment    Anticipated start date: 01/22/23   The following has been reviewed per standard work regarding the patient's treatment regimen: The patient's diagnosis, treatment plan and drug doses, and organ/hematologic function Lab orders and baseline tests specific to treatment regimen  The treatment plan start date, drug sequencing, and pre-medications Prior authorization status  Patient's documented medication list, including drug-drug interaction screen and prescriptions for anti-emetics and supportive care specific to the treatment regimen The drug concentrations, fluid compatibility, administration routes, and timing of the medications to be used The patient's access for treatment and lifetime cumulative dose history, if applicable  The patient's medication allergies and previous infusion related reactions, if applicable   Changes made to treatment plan:  N/A  Follow up needed:  N/A   Stephens Shire, [facility], 01/22/2023  8:45 AM

## 2023-01-22 NOTE — Patient Instructions (Signed)
[facility]  Discharge Instructions: Thank you for choosing [facility] to provide your oncology and hematology care.  If you have a lab appointment with the Cancer Center - please note that after April 8th, 2024, all labs will be drawn in the cancer center.  You do not have to check in or register with the main entrance as you have in the past but will complete your check-in in the cancer center.  Wear comfortable clothing and clothing appropriate for easy access to any Portacath or PICC line.   We strive to give you quality time with your provider. You may need to reschedule your appointment if you arrive late (15 or more minutes).  Arriving late affects you and other patients whose appointments are after yours.  Also, if you miss three or more appointments without notifying the office, you may be dismissed from the clinic at the provider's discretion.      For prescription refill requests, have your pharmacy contact our office and allow 72 hours for refills to be completed.    Today you received the following chemotherapy and/or immunotherapy agents Keytruda      To help prevent nausea and vomiting after your treatment, we encourage you to take your nausea medication as directed.  BELOW ARE SYMPTOMS THAT SHOULD BE REPORTED IMMEDIATELY: *FEVER GREATER THAN 100.4 F (38 C) OR HIGHER *CHILLS OR SWEATING *NAUSEA AND VOMITING THAT IS NOT CONTROLLED WITH YOUR NAUSEA MEDICATION *UNUSUAL SHORTNESS OF BREATH *UNUSUAL BRUISING OR BLEEDING *URINARY PROBLEMS (pain or burning when urinating, or frequent urination) *BOWEL PROBLEMS (unusual diarrhea, constipation, pain near the anus) TENDERNESS IN MOUTH AND THROAT WITH OR WITHOUT PRESENCE OF ULCERS (sore throat, sores in mouth, or a toothache) UNUSUAL RASH, SWELLING OR PAIN  UNUSUAL VAGINAL DISCHARGE OR ITCHING   Items with * indicate a potential emergency and should be followed up as soon as possible or go to the  Emergency Department if any problems should occur.  Please show the CHEMOTHERAPY ALERT CARD or IMMUNOTHERAPY ALERT CARD at check-in to the Emergency Department and triage nurse.  Should you have questions after your visit or need to cancel or reschedule your appointment, please contact [facility] [phone]  and follow the prompts.  Office hours are 8:00 a.m. to 4:30 p.m. Monday - Friday. Please note that voicemails left after 4:00 p.m. may not be returned until the following business day.  We are closed weekends and major holidays. You have access to a nurse at all times for urgent questions. Please call the main number to the clinic [phone] and follow the prompts.  For any non-urgent questions, you may also contact your provider using MyChart. We now offer e-Visits for anyone 18 and older to request care online for non-urgent symptoms. For details visit [email or web address].   Also download the MyChart app! Go to the app store, search "MyChart", open the app, select , and log in with your MyChart username and password.   

## 2023-01-22 NOTE — Progress Notes (Signed)
Patient presents for D1C1 Keytruda infusion per providers order.  Vital signs and labs within parameters for treatment.    Peripheral IV started and blood return noted pre and post infusion.  Treatment given today per MD orders.  Stable during infusion without adverse affects.  Vital signs stable.  No complaints at this time.  Discharge from clinic ambulatory in stable condition.  Alert and oriented X 3.  Follow up with [facility] as scheduled.

## 2023-01-24 NOTE — H&P (Signed)
[facility] History and Physical  Reason for Referral: Port-A-Cath insertion Referring Physician: Oncology referral  Chief Complaint   New Patient (Initial Visit)     David [name] is a 72 y.o. male.  HPI: Patient presents to discuss Port-A-Cath insertion.  He was diagnosed with kidney cancer in September 2023.  He subsequently underwent left nephrectomy in November 2023.  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  He denies ever having any neck procedures in the past.  He has no other significant contributory past medical history.  His surgical history includes nephrectomy and tonsillectomy.  He denies use of blood thinning medications.  He smokes 1/2 pack of cigarettes per day.  He denies use of alcohol and illicit drugs.  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 Doonquah's note   Pneumonia    Seizure (HCC)     Past Surgical History:  Procedure Laterality Date   COLONOSCOPY  05/31/2012   Procedure: COLONOSCOPY;  Surgeon: Mark A Jenkins, MD;  [facility];  Service: Gastroenterology;  Laterality: N/A;   ROBOT ASSISTED LAPAROSCOPIC NEPHRECTOMY Left 08/13/2022   Procedure: XI ROBOTIC ASSISTED LAPAROSCOPIC NEPHRECTOMY;  Surgeon: McKenzie, Patrick L, MD;  Location: [facility];  Service: Urology;  Laterality: Left;   TONSILLECTOMY      No family history on file.  Social History   Tobacco Use   Smoking status: Every Day    Packs/day: .5    Types: Cigarettes   Smokeless tobacco: Never  Vaping Use   Vaping Use: Never used  Substance Use Topics   Alcohol use: No   Drug use: No    Medications: I have reviewed the patient's current medications. Allergies as of 01/21/2023   No Known Allergies      Medication List        Accurate as of Jan 21, 2023  1:50  PM. If you have any questions, ask your nurse or doctor.          KEYTRUDA IV Inject into the vein every 21 ( twenty-one) days. Start taking on: Jan 22, 2023   Lenvima (14 MG Daily Dose) 10 & 4 MG capsule Generic drug: lenvatinib 14 mg daily dose Take 14 mg by mouth daily. (Take one 10mg capsule and one 4mg capsule for a total dose of 14mg)   tamsulosin 0.4 MG Caps capsule Commonly known as: FLOMAX Take 0.4 mg by mouth daily.         ROS:  Constitutional: negative for chills, fatigue, and fevers Eyes: negative for visual disturbance and pain Ears, nose, mouth, throat, and face: negative for ear drainage, sore throat, and sinus problems Respiratory: negative for cough, wheezing, and shortness of breath Cardiovascular: negative for chest pain and palpitations Gastrointestinal: negative for abdominal pain, nausea, reflux symptoms, and vomiting Genitourinary:negative for dysuria and frequency Integument/breast: negative for dryness and rash Hematologic/lymphatic: negative for bleeding and lymphadenopathy Musculoskeletal:negative for back pain and neck pain Neurological: negative for dizziness and tremors Endocrine: negative for temperature intolerance  Blood pressure 134/78, pulse (!) 106, temperature 97.6 F (36.4 C), temperature source Other (Comment), resp. rate 18, height 5' 11.5" (1.816 m), weight 242 lb (109.8 kg), SpO2 98 %. Physical Exam Vitals reviewed.  Constitutional:      Appearance: Normal appearance.    HENT:     Head: Normocephalic and atraumatic.  Eyes:     Extraocular Movements: Extraocular movements intact.     Pupils: Pupils are equal, round, and reactive to light.  Cardiovascular:     Rate and Rhythm: Normal rate and regular rhythm.  Pulmonary:     Effort: Pulmonary effort is normal.     Breath sounds: Normal breath sounds.  Abdominal:     General: There is no distension.     Palpations: Abdomen is soft.     Tenderness: There is no abdominal  tenderness.  Musculoskeletal:        General: Normal range of motion.     Cervical back: Normal range of motion. No rigidity.  Lymphadenopathy:     Cervical: No cervical adenopathy.  Neurological:     General: No focal deficit present.     Mental Status: He is alert and oriented to person, place, and time.  Psychiatric:        Mood and Affect: Mood normal.        Behavior: Behavior normal.     Results: Results for orders placed or performed in visit on 01/20/23 (from the past 48 hour(s))  Iron and TIBC ([facility] ONLY)     Status: None   Collection Time: 01/20/23  1:12 PM  Result Value Ref Range   Iron 66 45 - 182 ug/dL   TIBC 250 250 - 450 ug/dL   Saturation Ratios 26 17.9 - 39.5 %   UIBC 184 ug/dL    Comment: Performed at [facility], [address] [place], [place] [address]  Ferritin     Status: Abnormal   Collection Time: 01/20/23  1:12 PM  Result Value Ref Range   Ferritin 655 (H) 24 - 336 ng/mL    Comment: Performed at [facility], [address] [place], [place] [address]  TSH     Status: Abnormal   Collection Time: 01/20/23  1:12 PM  Result Value Ref Range   TSH 5.040 (H) 0.350 - 4.500 uIU/mL    Comment: Performed by a 3rd Generation assay with a functional sensitivity of <=0.01 uIU/mL. Performed at [facility], [address] [place], [place] [address]   Lactate dehydrogenase     Status: Abnormal   Collection Time: 01/20/23  1:13 PM  Result Value Ref Range   LDH 248 (H) 98 - 192 U/L    Comment: Performed at [facility], [address] [place], [place] [address]  Comprehensive metabolic panel     Status: Abnormal   Collection Time: 01/20/23  1:13 PM  Result Value Ref Range   Sodium 135 135 - 145 mmol/L   Potassium 4.3 3.5 - 5.1 mmol/L   Chloride 105 98 - 111 mmol/L   CO2 22 22 - 32 mmol/L   Glucose, Bld 108 (H) 70 - 99 mg/dL    Comment: Glucose reference range applies only to samples taken after fasting for at least 8 hours.   BUN 24  (H) 8 - 23 mg/dL   Creatinine, Ser 1.17 0.61 - 1.24 mg/dL   Calcium 9.0 8.9 - 10.3 mg/dL   Total Protein 7.7 6.5 - 8.1 g/dL   Albumin 4.0 3.5 - 5.0 g/dL   AST 51 (H) 15 - 41 U/L   ALT 56 (H) 0 - 44 U/L   Alkaline Phosphatase 94 38 - 126 U/L   Total Bilirubin 1.0 0.3 - 1.2 mg/dL   GFR, Estimated >60 >60 mL/min    Comment: (  NOTE) Calculated using the CKD-EPI Creatinine Equation (2021)    Anion gap 8 5 - 15    Comment: Performed at [facility], [address] [place], [place] [address]  CBC with Differential     Status: Abnormal   Collection Time: 01/20/23  1:13 PM  Result Value Ref Range   WBC 8.3 4.0 - 10.5 K/uL   RBC 3.42 (L) 4.22 - 5.81 MIL/uL   Hemoglobin 11.3 (L) 13.0 - 17.0 g/dL   HCT 34.2 (L) 39.0 - 52.0 %   MCV 100.0 80.0 - 100.0 fL   MCH 33.0 26.0 - 34.0 pg   MCHC 33.0 30.0 - 36.0 g/dL   RDW 18.7 (H) 11.5 - 15.5 %   Platelets 233 150 - 400 K/uL   nRBC 0.0 0.0 - 0.2 %   Neutrophils Relative % 16 %   Neutro Abs 1.3 (L) 1.7 - 7.7 K/uL   Lymphocytes Relative 62 %   Lymphs Abs 5.2 (H) 0.7 - 4.0 K/uL   Monocytes Relative 17 %   Monocytes Absolute 1.4 (H) 0.1 - 1.0 K/uL   Eosinophils Relative 3 %   Eosinophils Absolute 0.2 0.0 - 0.5 K/uL   Basophils Relative 1 %   Basophils Absolute 0.0 0.0 - 0.1 K/uL   Immature Granulocytes 1 %   Abs Immature Granulocytes 0.05 0.00 - 0.07 K/uL    Comment: Performed at [facility], [address] [place], [place] [address]  Magnesium     Status: None   Collection Time: 01/20/23  1:13 PM  Result Value Ref Range   Magnesium 2.1 1.7 - 2.4 mg/dL    Comment: Performed at [facility], [address] [place], [place] [address]    No results found.   Assessment & Plan:  David [name] is a 72 y.o. male who presents to discuss Port-A-Cath insertion.  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  After careful consideration, David [name] has decided to proceed with surgery.  -Patient tentatively scheduled for surgery on 5/15 -Information provided to the patient regarding Port-A-Cath  All questions were answered to the satisfaction of the patient.  Earlisha Sharples, DO [facility] [address] [place], [place] [address] [phone] (office)      

## 2023-01-25 ENCOUNTER — Encounter (HOSPITAL_COMMUNITY)
Admission: RE | Admit: 2023-01-25 | Discharge: 2023-01-25 | Disposition: A | Discharge status: Home / Self Care | Payer: Medicare PPO | Source: Ambulatory Visit | Attending: Surgery | Admitting: Surgery

## 2023-01-25 ENCOUNTER — Other Ambulatory Visit: Payer: Self-pay

## 2023-01-25 ENCOUNTER — Encounter (HOSPITAL_COMMUNITY): Payer: Self-pay

## 2023-01-25 NOTE — Progress Notes (Signed)
24 HOUR POST CHEMOTHERAPY CALL BACK:  Patient states that he is feeling great.  He denies any side effects or new symptoms.  Advised him to call the office with any new questions or concerns.

## 2023-01-26 ENCOUNTER — Encounter (HOSPITAL_COMMUNITY)
Admission: RE | Admit: 2023-01-26 | Discharge: 2023-01-26 | Disposition: A | Discharge status: Home / Self Care | Payer: Medicare PPO | Source: Ambulatory Visit | Attending: Surgery | Admitting: Surgery

## 2023-01-27 ENCOUNTER — Ambulatory Visit (HOSPITAL_COMMUNITY): Payer: Medicare PPO | Admitting: Anesthesiology

## 2023-01-27 ENCOUNTER — Ambulatory Visit (HOSPITAL_COMMUNITY): Payer: Medicare PPO

## 2023-01-27 ENCOUNTER — Ambulatory Visit (HOSPITAL_BASED_OUTPATIENT_CLINIC_OR_DEPARTMENT_OTHER): Payer: Medicare PPO | Admitting: Anesthesiology

## 2023-01-27 ENCOUNTER — Other Ambulatory Visit: Payer: Self-pay

## 2023-01-27 ENCOUNTER — Encounter (HOSPITAL_COMMUNITY)
Admission: RE | Disposition: A | Discharge status: Home / Self Care | Payer: Self-pay | Source: Home / Self Care | Attending: Surgery

## 2023-01-27 ENCOUNTER — Encounter (HOSPITAL_COMMUNITY): Payer: Self-pay | Admitting: Surgery

## 2023-01-27 ENCOUNTER — Ambulatory Visit (HOSPITAL_COMMUNITY)
Admission: RE | Admit: 2023-01-27 | Discharge: 2023-01-27 | Disposition: A | Discharge status: Home / Self Care | Payer: Medicare PPO | Attending: Surgery | Admitting: Surgery

## 2023-01-27 DIAGNOSIS — Z905 Acquired absence of kidney: Secondary | ICD-10-CM | POA: Insufficient documentation

## 2023-01-27 DIAGNOSIS — F17210 Nicotine dependence, cigarettes, uncomplicated: Secondary | ICD-10-CM | POA: Diagnosis not present

## 2023-01-27 DIAGNOSIS — G4730 Sleep apnea, unspecified: Secondary | ICD-10-CM

## 2023-01-27 DIAGNOSIS — C642 Malignant neoplasm of left kidney, except renal pelvis: Secondary | ICD-10-CM | POA: Diagnosis not present

## 2023-01-27 DIAGNOSIS — Z452 Encounter for adjustment and management of vascular access device: Secondary | ICD-10-CM | POA: Diagnosis not present

## 2023-01-27 DIAGNOSIS — C7951 Secondary malignant neoplasm of bone: Secondary | ICD-10-CM | POA: Diagnosis not present

## 2023-01-27 DIAGNOSIS — J189 Pneumonia, unspecified organism: Secondary | ICD-10-CM | POA: Diagnosis not present

## 2023-01-27 DIAGNOSIS — G4733 Obstructive sleep apnea (adult) (pediatric): Secondary | ICD-10-CM | POA: Insufficient documentation

## 2023-01-27 DIAGNOSIS — C649 Malignant neoplasm of unspecified kidney, except renal pelvis: Secondary | ICD-10-CM | POA: Diagnosis not present

## 2023-01-27 HISTORY — PX: PORTACATH PLACEMENT: SHX2246

## 2023-01-27 SURGERY — INSERTION, TUNNELED CENTRAL VENOUS DEVICE, WITH PORT
Anesthesia: General | Site: Chest | Laterality: Right

## 2023-01-27 MED ORDER — FENTANYL CITRATE (PF) 100 MCG/2ML IJ SOLN
INTRAMUSCULAR | Status: AC
  Filled 2023-01-27: qty 2

## 2023-01-27 MED ORDER — SODIUM CHLORIDE (PF) 0.9 % IJ SOLN
INTRAMUSCULAR | Status: DC | PRN
  Administered 2023-01-27: 500 mL via INTRAVENOUS

## 2023-01-27 MED ORDER — PROPOFOL 500 MG/50ML IV EMUL
INTRAVENOUS | Status: AC
  Filled 2023-01-27: qty 50

## 2023-01-27 MED ORDER — CHLORHEXIDINE GLUCONATE 0.12 % MT SOLN
15.0000 mL | Freq: Once | OROMUCOSAL | Status: AC
  Administered 2023-01-27: 15 mL via OROMUCOSAL

## 2023-01-27 MED ORDER — LIDOCAINE HCL (PF) 1 % IJ SOLN
INTRAMUSCULAR | Status: DC | PRN
  Administered 2023-01-27: 12 mL

## 2023-01-27 MED ORDER — EPHEDRINE SULFATE-NACL 50-0.9 MG/10ML-% IV SOSY
PREFILLED_SYRINGE | INTRAVENOUS | Status: DC | PRN
  Administered 2023-01-27: 10 mg via INTRAVENOUS

## 2023-01-27 MED ORDER — PROPOFOL 500 MG/50ML IV EMUL
INTRAVENOUS | Status: DC | PRN
  Administered 2023-01-27: 100 ug/kg/min via INTRAVENOUS

## 2023-01-27 MED ORDER — DEXMEDETOMIDINE HCL IN NACL 80 MCG/20ML IV SOLN
INTRAVENOUS | Status: AC
  Filled 2023-01-27: qty 20

## 2023-01-27 MED ORDER — HEPARIN SOD (PORK) LOCK FLUSH 100 UNIT/ML IV SOLN
INTRAVENOUS | Status: DC | PRN
  Administered 2023-01-27: 400 [IU] via INTRAVENOUS

## 2023-01-27 MED ORDER — CHLORHEXIDINE GLUCONATE CLOTH 2 % EX PADS: 6.0000 | MEDICATED_PAD | Freq: Once | CUTANEOUS | Status: DC

## 2023-01-27 MED ORDER — ORAL CARE MOUTH RINSE: 15.0000 mL | Freq: Once | OROMUCOSAL | Status: AC

## 2023-01-27 MED ORDER — LACTATED RINGERS IV SOLN: INTRAVENOUS | Status: DC

## 2023-01-27 MED ORDER — ONDANSETRON HCL 4 MG/2ML IJ SOLN: 4.0000 mg | Freq: Once | INTRAMUSCULAR | Status: DC | PRN

## 2023-01-27 MED ORDER — LIDOCAINE HCL (CARDIAC) PF 100 MG/5ML IV SOSY
PREFILLED_SYRINGE | INTRAVENOUS | Status: DC | PRN
  Administered 2023-01-27: 40 mg via INTRAVENOUS

## 2023-01-27 MED ORDER — EPHEDRINE 5 MG/ML INJ
INTRAVENOUS | Status: AC
  Filled 2023-01-27: qty 5

## 2023-01-27 MED ORDER — HYDROMORPHONE HCL 1 MG/ML IJ SOLN: 0.2500 mg | INTRAMUSCULAR | Status: DC | PRN

## 2023-01-27 MED ORDER — CEFAZOLIN SODIUM-DEXTROSE 2-4 GM/100ML-% IV SOLN
2.0000 g | INTRAVENOUS | Status: AC
  Administered 2023-01-27: 2 g via INTRAVENOUS
  Filled 2023-01-27: qty 100

## 2023-01-27 MED ORDER — LIDOCAINE HCL (PF) 2 % IJ SOLN
INTRAMUSCULAR | Status: AC
  Filled 2023-01-27: qty 5

## 2023-01-27 MED ORDER — HEPARIN SOD (PORK) LOCK FLUSH 100 UNIT/ML IV SOLN
INTRAVENOUS | Status: AC
  Filled 2023-01-27: qty 5

## 2023-01-27 MED ORDER — PHENYLEPHRINE 80 MCG/ML (10ML) SYRINGE FOR IV PUSH (FOR BLOOD PRESSURE SUPPORT)
PREFILLED_SYRINGE | INTRAVENOUS | Status: AC
  Filled 2023-01-27: qty 10

## 2023-01-27 MED ORDER — ACETAMINOPHEN 500 MG PO TABS: 500.0000 mg | ORAL_TABLET | Freq: Four times a day (QID) | ORAL | 0 refills | Status: AC

## 2023-01-27 MED ORDER — LIDOCAINE HCL (PF) 1 % IJ SOLN
INTRAMUSCULAR | Status: AC
  Filled 2023-01-27: qty 30

## 2023-01-27 MED ORDER — PROPOFOL 10 MG/ML IV BOLUS
INTRAVENOUS | Status: AC
  Filled 2023-01-27: qty 20

## 2023-01-27 MED ORDER — PHENYLEPHRINE 80 MCG/ML (10ML) SYRINGE FOR IV PUSH (FOR BLOOD PRESSURE SUPPORT)
PREFILLED_SYRINGE | INTRAVENOUS | Status: DC | PRN
  Administered 2023-01-27: 80 ug via INTRAVENOUS
  Administered 2023-01-27: 160 ug via INTRAVENOUS
  Administered 2023-01-27 (×2): 80 ug via INTRAVENOUS
  Administered 2023-01-27 (×3): 160 ug via INTRAVENOUS

## 2023-01-27 MED ORDER — PROPOFOL 10 MG/ML IV BOLUS
INTRAVENOUS | Status: DC | PRN
  Administered 2023-01-27: 30 mg via INTRAVENOUS

## 2023-01-27 MED ORDER — FENTANYL CITRATE (PF) 100 MCG/2ML IJ SOLN
INTRAMUSCULAR | Status: DC | PRN
  Administered 2023-01-27 (×2): 25 ug via INTRAVENOUS

## 2023-01-27 MED ORDER — MIDAZOLAM HCL 2 MG/2ML IJ SOLN
INTRAMUSCULAR | Status: DC | PRN
  Administered 2023-01-27 (×2): 1 mg via INTRAVENOUS

## 2023-01-27 MED ORDER — MIDAZOLAM HCL 2 MG/2ML IJ SOLN
INTRAMUSCULAR | Status: AC
  Filled 2023-01-27: qty 2

## 2023-01-27 SURGICAL SUPPLY — 32 items
ADH SKN CLS APL DERMABOND .7 (GAUZE/BANDAGES/DRESSINGS) ×1
APL PRP STRL LF ISPRP CHG 10.5 (MISCELLANEOUS) ×1
APPLICATOR CHLORAPREP 10.5 ORG (MISCELLANEOUS) ×1 IMPLANT
BAG DECANTER FOR FLEXI CONT (MISCELLANEOUS) ×1 IMPLANT
BLADE SURG SZ11 CARB STEEL (BLADE) ×1 IMPLANT
CLOTH BEACON ORANGE TIMEOUT ST (SAFETY) ×1 IMPLANT
COVER LIGHT HANDLE STERIS (MISCELLANEOUS) ×2 IMPLANT
COVER PROBE U/S 5X48 (MISCELLANEOUS) ×1 IMPLANT
DERMABOND ADVANCED .7 DNX12 (GAUZE/BANDAGES/DRESSINGS) ×1 IMPLANT
DRAPE C-ARM FOLDED MOBILE STRL (DRAPES) ×1 IMPLANT
ELECT REM PT RETURN 9FT ADLT (ELECTROSURGICAL) ×1 IMPLANT
ELECTRODE REM PT RTRN 9FT ADLT (ELECTROSURGICAL) ×1 IMPLANT
GLOVE BIOGEL PI IND STRL 6.5 (GLOVE) ×1 IMPLANT
GLOVE BIOGEL PI IND STRL 7.0 (GLOVE) ×2 IMPLANT
GLOVE SS BIOGEL STRL SZ 6.5 (GLOVE) IMPLANT
GLOVE SURG SS PI 6.5 STRL IVOR (GLOVE) ×1 IMPLANT
GOWN STRL REUS W/TWL LRG LVL3 (GOWN DISPOSABLE) ×2 IMPLANT
IV NS 500ML (IV SOLUTION) ×1
IV NS 500ML BAXH (IV SOLUTION) ×1 IMPLANT
KIT PORT POWER 8FR ISP MRI (Port) ×1 IMPLANT
KIT TURNOVER KIT A (KITS) ×1 IMPLANT
MANIFOLD NEPTUNE II (INSTRUMENTS) ×1 IMPLANT
NDL HYPO 25X1 1.5 SAFETY (NEEDLE) ×1 IMPLANT
NEEDLE HYPO 25X1 1.5 SAFETY (NEEDLE) ×1 IMPLANT
PACK MINOR (CUSTOM PROCEDURE TRAY) ×1 IMPLANT
PAD ARMBOARD 7.5X6 YLW CONV (MISCELLANEOUS) ×1 IMPLANT
SET BASIN LINEN APH (SET/KITS/TRAYS/PACK) ×1 IMPLANT
SUT MNCRL AB 4-0 PS2 18 (SUTURE) ×1 IMPLANT
SUT VIC AB 3-0 SH 27 (SUTURE) ×1
SUT VIC AB 3-0 SH 27X BRD (SUTURE) ×1 IMPLANT
SYR 10ML LL (SYRINGE) ×2 IMPLANT
SYR CONTROL 10ML LL (SYRINGE) ×1 IMPLANT

## 2023-01-27 NOTE — Anesthesia Postprocedure Evaluation (Signed)
Anesthesia Post Note  Patient: David [name]  Procedure(s) Performed: INSERTION PORT-A-CATH, RIJ (Right: Chest)  Patient location during evaluation: Phase II Anesthesia Type: General Level of consciousness: awake and alert and oriented Pain management: pain level controlled Vital Signs Assessment: post-procedure vital signs reviewed and stable Respiratory status: spontaneous breathing, nonlabored ventilation and respiratory function stable Cardiovascular status: blood pressure returned to baseline and stable Postop Assessment: no apparent nausea or vomiting Anesthetic complications: no  No notable events documented.   Last Vitals:  Vitals:   01/27/23 0915 01/27/23 0926  BP: 135/73 132/70  Pulse: 64   Resp: 14   Temp:  (!) 36.3 C  SpO2: 100% 100%    Last Pain:  Vitals:   01/27/23 0926  TempSrc: Axillary  PainSc: 0-No pain                 Vashon Arch C Nancylee Gaines

## 2023-01-27 NOTE — Transfer of Care (Signed)
Immediate Anesthesia Transfer of Care Note  Patient: David [name]  Procedure(s) Performed: INSERTION PORT-A-CATH, RIJ (Right: Chest)  Patient Location: PACU  Anesthesia Type:General  Level of Consciousness: awake and patient cooperative  Airway & Oxygen Therapy: Patient Spontanous Breathing and Patient connected to nasal cannula oxygen  Post-op Assessment: Report given to RN and Post -op Vital signs reviewed and stable  Post vital signs: Reviewed and stable  Last Vitals:  Vitals Value Taken Time  BP 112/55 01/27/23 0838  Temp 97.7 01/27/23  0840  Pulse 77 01/27/23 0840  Resp 18 01/27/23 0840  SpO2 100 % 01/27/23 0840  Vitals shown include unvalidated device data.  Last Pain:  Vitals:   01/27/23 0654  TempSrc: Oral  PainSc: 5       Patients Stated Pain Goal: 5 (01/27/23 0654)  Complications: No notable events documented.

## 2023-01-27 NOTE — Anesthesia Preprocedure Evaluation (Addendum)
Anesthesia Evaluation  Patient identified by MRN, date of birth, ID band Patient awake    Reviewed: Allergy & Precautions, H&P , NPO status , Patient's Chart, lab work & pertinent test results  Airway Mallampati: II  TM Distance: >3 FB Neck ROM: Full    Dental  (+) Edentulous Upper, Edentulous Lower   Pulmonary sleep apnea , pneumonia, Current Smoker and Patient abstained from smoking.   Pulmonary exam normal breath sounds clear to auscultation       Cardiovascular negative cardio ROS Normal cardiovascular exam Rhythm:Regular Rate:Normal     Neuro/Psych Seizures -,   negative psych ROS   GI/Hepatic negative GI ROS,,,(+) Hepatitis -, C  Endo/Other  negative endocrine ROS    Renal/GU Renal InsufficiencyRenal disease (clear cell carcinoma of kidney, left nephrectomy)  negative genitourinary   Musculoskeletal negative musculoskeletal ROS (+)    Abdominal   Peds negative pediatric ROS (+)  Hematology negative hematology ROS (+)   Anesthesia Other Findings   Reproductive/Obstetrics negative OB ROS                              Anesthesia Physical Anesthesia Plan  ASA: 3  Anesthesia Plan: General   Post-op Pain Management: Dilaudid IV   Induction: Intravenous  PONV Risk Score and Plan: 2  Airway Management Planned: Nasal Cannula and Natural Airway  Additional Equipment:   Intra-op Plan:   Post-operative Plan:   Informed Consent: I have reviewed the patients History and Physical, chart, labs and discussed the procedure including the risks, benefits and alternatives for the proposed anesthesia with the patient or authorized representative who has indicated his/her understanding and acceptance.       Plan Discussed with: CRNA and Surgeon  Anesthesia Plan Comments:          Anesthesia Quick Evaluation

## 2023-01-27 NOTE — Progress Notes (Signed)
[facility]  Spoke with the patient's friend, Ritta Slot, on the phone.  I explained that he tolerated the procedure without difficulty.  He has dissolvable stitches under the skin with overlying skin glue.  This will flake off in 10 to 14 days.  I discharged him home with a prescription for narcotic pain medication that they should take as needed for pain.  I also want him taking scheduled Tylenol.  If they take the narcotic pain medication, they should take a stool softener as well.  The patient will follow-up with me in 2 weeks for phone follow-up.  All questions were answered to his expressed satisfaction.  Theophilus Kinds, DO [facility] [address] [place], [place] [address] [phone] (office)

## 2023-01-27 NOTE — Interval H&P Note (Signed)
History and Physical Interval Note:  01/27/2023 7:26 AM  David [name]  has presented today for surgery, with the diagnosis of CLEAR CELL RENAL CARCINOMA.  The various methods of treatment have been discussed with the patient and family. After consideration of risks, benefits and [name] options for treatment, the patient has consented to  Procedure(s): INSERTION PORT-A-CATH, RIJ (Right) as a surgical intervention.  The patient's history has been reviewed, patient examined, no change in status, stable for surgery.  I have reviewed the patient's chart and labs.  Questions were answered to the patient's satisfaction.     Timika Muench A Chara Marquard

## 2023-01-27 NOTE — Discharge Instructions (Signed)
Ambulatory Surgery Discharge Instructions  General Anesthesia or Sedation Do not drive or operate heavy machinery for 24 hours.  Do not consume alcohol, tranquilizers, sleeping medications, or any non-prescribed medications for 24 hours. Do not make important decisions or sign any important papers in the next 24 hours. You should have someone with you tonight at home.  Activity  You are advised to go directly home from the hospital.  Restrict your activities and rest for a day.  Resume light activity tomorrow. No heavy lifting over 10 lbs or strenuous exercise.  Fluids and Diet Begin with clear liquids, bouillon, dry toast, soda crackers.  If not nauseated, you may go to a regular diet when you desire.  Greasy and spicy foods are not advised.  Medications  If you have not had a bowel movement in 24 hours, take 2 tablespoons over the counter Milk of mag.             You May resume your blood thinners tomorrow (Aspirin, coumadin, or other).  You are being discharged with prescriptions for Opioid/Narcotic Medications: There are some specific considerations for these medications that you should know. Opioid Meds have risks & benefits. Addiction to these meds is always a concern with prolonged use Take medication only as directed Do not drive while taking narcotic pain medication Do not crush tablets or capsules Do not use a different container than medication was dispensed in Lock the container of medication in a cool, dry place out of reach of children and pets. Opioid medication can cause addiction Do not share with anyone else (this is a felony) Do not store medications for future use. Dispose of them properly.     Disposal:  Find a [place] household drug take back site near you.  If you can't get to a drug take back site, use the recipe below as a last resort to dispose of expired, unused or unwanted drugs. Disposal  (Do not dispose chemotherapy drugs this way, talk to your  prescribing doctor instead.) Step 1: Mix drugs (do not crush) with dirt, kitty litter, or used coffee grounds and add a small amount of water to dissolve any solid medications. Step 2: Seal drugs in plastic bag. Step 3: Place plastic bag in trash. Step 4: Take prescription container and scratch out personal information, then recycle or throw away.  Operative Site  You have a liquid bandage over your incisions, this will begin to flake off in about a week. Ok to shower tomorrow. Keep wound clean and dry. No baths or swimming. No lifting more than 10 pounds.  Contact Information: If you have questions or concerns, please call our office, [phone], Monday- Thursday 8AM-5PM and Friday 8AM-12Noon.  If it is after hours or on the weekend, please call [facility] Main Number, [phone], and ask to speak to the surgeon on call for Dr. Robbye Dede at [place].   SPECIFIC COMPLICATIONS TO WATCH FOR: Inability to urinate Fever over 101? F by mouth Nausea and vomiting lasting longer than 24 hours. Pain not relieved by medication ordered Swelling around the operative site Increased redness, warmth, hardness, around operative area Numbness, tingling, or cold fingers or toes Blood -soaked dressing, (small amounts of oozing may be normal) Increasing and progressive drainage from surgical area or exam site  

## 2023-01-27 NOTE — Op Note (Signed)
Operative Note 01/27/23   Preoperative Diagnosis: Clear cell renal cell carcinoma   Postoperative Diagnosis: Same   Procedure(s) Performed: Right Internal Jugular Port-A-Cath placement with ultrasound guidance   Surgeon: Theophilus Kinds, DO    Assistants: No qualified resident was available   Anesthesia: Monitored anesthesia care   Anesthesiologist: Molli Barrows, MD    Specimens: None   Estimated Blood Loss: Minimal   Fluoroscopy time: 65 seconds   Blood Replacement: None    Complications: None    Operative Findings: Appropriately positioned catheter noted at the completion of the case  Indications: Patient is a 72 year old male who presents for right IJ port-a-cath insertion. He was recently diagnosed with left clear cell renal cell carcinoma, and will need additional treatment.  He is agreeable to port-a-cath insertion.  All risks and benefits of performing this procedure were discussed with the patient including pain, infection, bleeding, damage to the surrounding structures, and need for more procedures or surgery. The patient voiced understanding of the procedure, all questions were sought and answered, and consent was obtained.  Procedure: The patient was brought into the operating room and MAC was induced.  One percent lidocaine was used for local anesthesia.   The right chest and neck was prepped and draped in the usual sterile fashion.  Preoperative antibiotics were given.  Using ultrasound guidance, the area overlying the right IJ was localized.  Again using ultrasound guidance, the access needle was advanced into the right internal jugular vein using the Seldinger technique without difficulty.  The wire was noted to be within the vein on ultrasound.  A guidewire was then advanced into the right atrium under fluoroscopic guidance.  Ectopia was not noted. The skin was knicked.  An incision was made below the right clavicle.  A subcutaneous pocket was formed. The  catheter was then tunneled from the pocket to the access site in the neck.  An introducer and peel-away sheath were placed over the guidewire. The catheter was then inserted through the peel-away sheath and the peel-away sheath was removed.  A spot film was performed to confirm the position. The catheter was then attached to the port and the port placed in subcutaneous pocket. Adequate positioning was confirmed by fluoroscopy. Hemostasis was confirmed.  Good backflow of blood was noted on aspiration of the port. The port was flushed with heparin flush. Subcutaneous layer was reapproximated using a 3-0 Vicryl interrupted suture. The skin was closed using a 4-0 Vicryl subcuticular suture. Dermabond was applied.    All tape and needle counts were correct at the end of the procedure. The patient was transferred to PACU in stable condition. A chest x-ray will be performed at that time.  Theophilus Kinds, DO  [facility] [address] [place], [place] [address] [phone] (office)

## 2023-01-28 ENCOUNTER — Inpatient Hospital Stay: Payer: Medicare PPO | Admitting: Licensed Clinical Social Worker

## 2023-01-28 ENCOUNTER — Inpatient Hospital Stay (HOSPITAL_BASED_OUTPATIENT_CLINIC_OR_DEPARTMENT_OTHER): Payer: Medicare PPO | Admitting: Physician Assistant

## 2023-01-28 ENCOUNTER — Inpatient Hospital Stay: Payer: Medicare PPO

## 2023-01-28 ENCOUNTER — Other Ambulatory Visit: Payer: Medicare PPO | Admitting: Licensed Clinical Social Worker

## 2023-01-28 ENCOUNTER — Encounter: Payer: Self-pay | Admitting: Physician Assistant

## 2023-01-28 VITALS — BP 166/80 | HR 73 | Temp 97.8°F | Resp 20 | Wt 246.0 lb

## 2023-01-28 DIAGNOSIS — C642 Malignant neoplasm of left kidney, except renal pelvis: Secondary | ICD-10-CM

## 2023-01-28 DIAGNOSIS — I158 Other secondary hypertension: Secondary | ICD-10-CM | POA: Diagnosis not present

## 2023-01-28 DIAGNOSIS — Z5112 Encounter for antineoplastic immunotherapy: Secondary | ICD-10-CM | POA: Diagnosis not present

## 2023-01-28 DIAGNOSIS — R7989 Other specified abnormal findings of blood chemistry: Secondary | ICD-10-CM | POA: Diagnosis not present

## 2023-01-28 DIAGNOSIS — M25512 Pain in left shoulder: Secondary | ICD-10-CM | POA: Diagnosis not present

## 2023-01-28 DIAGNOSIS — I10 Essential (primary) hypertension: Secondary | ICD-10-CM | POA: Diagnosis not present

## 2023-01-28 DIAGNOSIS — Z905 Acquired absence of kidney: Secondary | ICD-10-CM | POA: Diagnosis not present

## 2023-01-28 LAB — CBC WITH DIFFERENTIAL/PLATELET
Abs Immature Granulocytes: 0.1 10*3/uL — ABNORMAL HIGH (ref 0.00–0.07)
Basophils Absolute: 0 10*3/uL (ref 0.0–0.1)
Basophils Relative: 0 %
Eosinophils Absolute: 0.5 10*3/uL (ref 0.0–0.5)
Eosinophils Relative: 5 %
HCT: 35.4 % — ABNORMAL LOW (ref 39.0–52.0)
Hemoglobin: 11.8 g/dL — ABNORMAL LOW (ref 13.0–17.0)
Lymphocytes Relative: 56 %
Lymphs Abs: 5.4 10*3/uL — ABNORMAL HIGH (ref 0.7–4.0)
MCH: 33.3 pg (ref 26.0–34.0)
MCHC: 33.3 g/dL (ref 30.0–36.0)
MCV: 100 fL (ref 80.0–100.0)
Monocytes Absolute: 1.5 10*3/uL — ABNORMAL HIGH (ref 0.1–1.0)
Monocytes Relative: 16 %
Myelocytes: 1 %
Neutro Abs: 2.1 10*3/uL (ref 1.7–7.7)
Neutrophils Relative %: 22 %
Platelets: 206 10*3/uL (ref 150–400)
RBC: 3.54 MIL/uL — ABNORMAL LOW (ref 4.22–5.81)
RDW: 19 % — ABNORMAL HIGH (ref 11.5–15.5)
WBC: 9.6 10*3/uL (ref 4.0–10.5)
nRBC: 0.4 % — ABNORMAL HIGH (ref 0.0–0.2)

## 2023-01-28 LAB — COMPREHENSIVE METABOLIC PANEL
ALT: 86 U/L — ABNORMAL HIGH (ref 0–44)
AST: 90 U/L — ABNORMAL HIGH (ref 15–41)
Albumin: 3.6 g/dL (ref 3.5–5.0)
Alkaline Phosphatase: 95 U/L (ref 38–126)
Anion gap: 9 (ref 5–15)
BUN: 22 mg/dL (ref 8–23)
CO2: 22 mmol/L (ref 22–32)
Calcium: 8.8 mg/dL — ABNORMAL LOW (ref 8.9–10.3)
Chloride: 104 mmol/L (ref 98–111)
Creatinine, Ser: 1.19 mg/dL (ref 0.61–1.24)
GFR, Estimated: >60 mL/min (ref >60)
Glucose, Bld: 105 mg/dL — ABNORMAL HIGH (ref 70–99)
Potassium: 4.2 mmol/L (ref 3.5–5.1)
Sodium: 135 mmol/L (ref 135–145)
Total Bilirubin: 1 mg/dL (ref 0.3–1.2)
Total Protein: 7 g/dL (ref 6.5–8.1)

## 2023-01-28 LAB — MAGNESIUM: Magnesium: 1.9 mg/dL (ref 1.7–2.4)

## 2023-01-28 MED ORDER — AMLODIPINE BESYLATE 5 MG PO TABS: 5.0000 mg | ORAL_TABLET | Freq: Every day | ORAL | 1 refills | Status: DC

## 2023-01-28 NOTE — Progress Notes (Signed)
[facility] [address], [place] [address]   Clinic Day:  01/28/2023  Referring physician: Assunta Found, MD  Patient Care Team: Assunta Found, MD as PCP - General (Family Medicine) Doreatha Massed, MD as Medical Oncologist (Medical Oncology) Therese Sarah, RN as Oncology Nurse Navigator (Medical Oncology)  CHIEF COMPLAINT/PURPOSE OF CONSULT:   Diagnosis: clear cell renal cell carcinoma   Cancer Staging  Clear cell renal cell carcinoma, left [facility]) Staging form: Kidney, AJCC 8th Edition - Clinical stage from 01/12/2023: Stage IV (cT3a, cNX, cM1) - Unsigned    Prior Therapy: left nephrectomy 08/13/22  Current Therapy: Lenvatinib and pembrolizumab   HISTORY OF PRESENT ILLNESS:   Oncology History  Clear cell renal cell carcinoma, left (HCC)  01/12/2023 Initial Diagnosis   Clear cell renal cell carcinoma, left (HCC)   01/22/2023 -  Chemotherapy   Patient is on Treatment Plan : KIDNEY Pembrolizumab (200) q21d         David [name] is a 72 y.o. male returns for a follow up for continued management of clear cell renal cell carcinoma. He was last seen by Dr. Ellin [name] on 01/12/2023. In the interim, he completed Cycle 1 of Keytruda on 01/22/2023 and started Lenvima on 01/18/2023. He is unaccompanied for this visit.   Mr. David [name] reports that he tolerated his treatment without any significant limitations. His energy and appetite are fairly stable. He did experience constipation for a couple of days after the infusion which resolved on its own. He denies any nausea, vomiting or abdominal pain. He denies easy bruising or overt signs of bleeding. He continues to have left shoulder pain and is scheduled for a consultation with [facility] radiation oncology on 02/17/2023. He reports stable shortness of breath with exertion. He denies fevers, chills, sweats, chest pain or cough. He has no other complaints. Rest of the ROS is below.   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 Ronal [name] note   Pneumonia    Seizure [facility])     Surgical History: Past Surgical History:  Procedure Laterality Date   COLONOSCOPY  05/31/2012   Procedure: COLONOSCOPY;  Surgeon: Dalia Heading, MD;  Location: [facility] ENDO SUITE;  Service: Gastroenterology;  Laterality: N/A;   ROBOT ASSISTED LAPAROSCOPIC NEPHRECTOMY Left 08/13/2022   Procedure: XI ROBOTIC ASSISTED LAPAROSCOPIC NEPHRECTOMY;  Surgeon: Malen Gauze, MD;  Location: [facility] ORS;  Service: Urology;  Laterality: Left;   TONSILLECTOMY      Social History: Social History   Socioeconomic History   Marital status: Divorced    Spouse name: Not on file   Number of children: Not on file   Years of education: Not on file   Highest education level: Not on file  Occupational History   Not on file  Tobacco Use   Smoking status: Every Day    Packs/day: 0.50    Years: 65.00    Additional pack years: 0.00    Total pack years: 32.50    Types: Cigarettes   Smokeless tobacco: Never  Vaping Use   Vaping Use: Never used  Substance and Sexual Activity   Alcohol use: No   Drug use: No   Sexual activity: Not Currently  Other Topics Concern   Not on file  Social History Narrative  Not on file   Social Determinants of Health   Financial Resource Strain: Not on file  Food Insecurity: No Food Insecurity (01/12/2023)   Hunger Vital Sign    Worried About Running Out of Food in the Last Year: Never true    Ran Out of Food in the Last Year: Never true  Transportation Needs: No Transportation Needs (01/12/2023)   PRAPARE - Administrator, Civil Service (Medical): No    Lack of Transportation (Non-Medical): No  Physical Activity: Not on file  Stress: Not on file  Social Connections: Not on file  Intimate Partner Violence: Not At Risk  (01/12/2023)   Humiliation, Afraid, Rape, and Kick questionnaire    [name] of Current or Ex-Partner: No    Emotionally Abused: No    Physically Abused: No    Sexually Abused: No    Family History: History reviewed. No pertinent family history.  Current Medications:  Current Outpatient Medications:    acetaminophen (TYLENOL) 500 MG tablet, Take 1 tablet (500 mg total) by mouth every 6 (six) hours for 7 days., Disp: 28 tablet, Rfl: 0   amLODipine (NORVASC) 5 MG tablet, Take 1 tablet (5 mg total) by mouth daily., Disp: 30 tablet, Rfl: 1   lenvatinib 14 mg daily dose (LENVIMA) 10 & 4 MG capsule, Take 14 mg by mouth daily. (Take one 10mg  capsule and one 4mg  capsule for a total dose of 14mg ), Disp: 60 capsule, Rfl: 0   lidocaine-prilocaine (EMLA) cream, Apply to affected area once, Disp: 30 g, Rfl: 3   Pembrolizumab (KEYTRUDA IV), Inject into the vein every 21 ( twenty-one) days., Disp: , Rfl:    prochlorperazine (COMPAZINE) 10 MG tablet, Take 1 tablet (10 mg total) by mouth every 6 (six) hours as needed for nausea or vomiting., Disp: 30 tablet, Rfl: 1   tamsulosin (FLOMAX) 0.4 MG CAPS capsule, Take 0.4 mg by mouth daily. , Disp: , Rfl:    Allergies: No Known Allergies  REVIEW OF SYSTEMS:   Review of Systems  Constitutional:  Negative for chills, fatigue and fever.  Eyes:  Negative for eye problems.  Respiratory:  Negative for cough and shortness of breath.   Cardiovascular:  Negative for chest pain, leg swelling and palpitations.  Gastrointestinal:  Positive for constipation. Negative for abdominal pain, diarrhea, nausea and vomiting.  Genitourinary:  Negative for bladder incontinence, difficulty urinating, dysuria, frequency, hematuria and nocturia.   Musculoskeletal:  Positive for arthralgias (Left shoulder). Negative for back pain, flank pain, myalgias and neck pain.  Skin:  Negative for itching and rash.  Neurological:  Negative for dizziness, headaches and numbness.  Hematological:   Does not bruise/bleed easily.  Psychiatric/Behavioral:  The patient is not nervous/anxious.   All other systems reviewed and are negative.    VITALS:   Blood pressure (!) 166/80, pulse 73, temperature 97.8 F (36.6 C), temperature source Oral, resp. rate 20, weight 246 lb (111.6 kg), SpO2 100 %.  Wt Readings from Last 3 Encounters:  01/28/23 246 lb (111.6 kg)  01/25/23 241 lb 3.2 oz (109.4 kg)  01/22/23 241 lb 3.2 oz (109.4 kg)    Body mass index is 33.83 kg/m.  Performance status (ECOG): 1 - Symptomatic but completely ambulatory  PHYSICAL EXAM:   Physical Exam Vitals reviewed.  Constitutional:      Appearance: Normal appearance.  Cardiovascular:     Rate and Rhythm: Normal rate and regular rhythm.     Pulses: Normal pulses.     Heart sounds:  Normal heart sounds.  Pulmonary:     Effort: Pulmonary effort is normal.     Breath sounds: Normal breath sounds.  Abdominal:     Palpations: There is no hepatomegaly or splenomegaly.  Musculoskeletal:     Right lower leg: No edema.     Left lower leg: No edema.  Lymphadenopathy:     Cervical:     Right cervical: No deep cervical adenopathy. Neurological:     General: No focal deficit present.     Mental Status: He is alert and oriented to person, place, and time.  Psychiatric:        Mood and Affect: Mood normal.        Behavior: Behavior normal.     LABS:      Latest Ref Rng & Units 01/28/2023   10:11 AM 01/20/2023    1:13 PM 08/19/2022   11:00 AM  CBC  WBC 4.0 - 10.5 K/uL 9.6  8.3  7.9   Hemoglobin 13.0 - 17.0 g/dL 66.0  63.0  8.9   Hematocrit 39.0 - 52.0 % 35.4  34.2  27.1   Platelets 150 - 400 K/uL 206  233  287       Latest Ref Rng & Units 01/28/2023   10:11 AM 01/20/2023    1:13 PM 11/13/2022    9:13 AM  CMP  Glucose 70 - 99 mg/dL 160  109  323   BUN 8 - 23 mg/dL 22  24  19    Creatinine 0.61 - 1.24 mg/dL 5.57  3.22  0.25   Sodium 135 - 145 mmol/L 135  135  139   Potassium 3.5 - 5.1 mmol/L 4.2  4.3  4.8    Chloride 98 - 111 mmol/L 104  105  103   CO2 22 - 32 mmol/L 22  22  20    Calcium 8.9 - 10.3 mg/dL 8.8  9.0  9.3   Total Protein 6.5 - 8.1 g/dL 7.0  7.7  6.6   Total Bilirubin 0.3 - 1.2 mg/dL 1.0  1.0  0.4   Alkaline Phos 38 - 126 U/L 95  94  95   AST 15 - 41 U/L 90  51  95   ALT 0 - 44 U/L 86  56  131      No results found for: "CEA1", "CEA" / No results found for: "CEA1", "CEA" No results found for: "PSA1" No results found for: "KYH062" No results found for: "CAN125"  No results found for: "TOTALPROTELP", "ALBUMINELP", "A1GS", "A2GS", "BETS", "BETA2SER", "GAMS", "MSPIKE", "SPEI" Lab Results  Component Value Date   TIBC 250 01/20/2023   TIBC 271 04/03/2009   FERRITIN 655 (H) 01/20/2023   FERRITIN 870 (H) 04/03/2009   IRONPCTSAT 26 01/20/2023   IRONPCTSAT 72 (H) 04/03/2009   Lab Results  Component Value Date   LDH 248 (H) 01/20/2023     STUDIES:   DG Chest Port 1 View  Result Date: 01/27/2023 CLINICAL DATA:  [id number] Port-A-Cath in place [id number] EXAM: PORTABLE CHEST 1 VIEW COMPARISON:  CXR 11/19/22 FINDINGS: Right-sided chest port in place with the tip in the upper SVC, possibly near the confluence of brachiocephalic veins. No pleural effusion. No pneumothorax. Normal cardiac and mediastinal contours. No radiographically apparent displaced rib fractures. Visualized upper abdomen is unremarkable IMPRESSION: Interval placement of a right-sided chest port.  No pneumothorax Electronically Signed   By: Lorenza Cambridge M.D.   On: 01/27/2023 09:06   DG C-Arm 1-60 Min-No Report  Result  Date: 01/27/2023 Fluoroscopy was utilized by the requesting physician.  No radiographic interpretation.       ASSESSMENT & PLAN:   Assessment:  1.  Metastatic left kidney clear-cell RCC: - Presentation with left shoulder pain.  Weight loss of 50 pounds in the last 6 months but was trying to lose weight by cutting back on eating and doing some walking. - Left robotic assisted laparoscopic radical  nephrectomy by Dr. Ronne Binning on 08/13/2022 - Pathology: 7 cm clear-cell RCC with focal sarcomatoid and rhabdoid features, nuclear grade 4.  Tumor extends into the renal vein and renal sinus fat (PT3a).  Ureteral, vascular and all margins negative for tumor. - PET scan (12/24/2022): Enlarging hypermetabolic left adrenal metastasis.  2 small hypermetabolic soft tissue nodules in the left nephrectomy bed.  Hypermetabolic lytic lesions involving distal left clavicle and left temporal bone.  Indeterminate small hypermetabolic parotid nodules bilaterally. - IMDC: At least intermediate pending labs  2.  Social/family history: - Lives by himself and is independent of ADLs and IADLs.  He does not drive.  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  Current active smoker, half pack per day, started at age 20. - Mother might have had cancer, he is not sure.  Plan:  1.  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  --Labs from today were reviewed. WBC 9.6, Hgb 11.8, Plt 206, Creatinine 1.19. LFTs increased, AST 90 and ALT 86.  --MRI brain scheduled 02/05/2023. -Monitor LFTs as there was some baseline elevation.  --Due to elevated BP, recommend to decreased Lenvima from 14 mg to 10 mg daily. --RTC 02/15/2023 for labs and follow up before Cycle 2 of Keytruda.   2.  Hypertension: --BP 166/80 today, grade 3. --Secondary to Lenvima, will dose reduce to 10 mg PO daily starting today --Started amlodipine 5 mg PO daily --Encouraged patient to check BP daily at home.    3. Left shoulder pain: - Rated as 6 out of 10.  He is not requiring any pain medication at this time. - Scheduled for consultation with [facility] radiation oncology on 02/17/2023.   Patient expressed understanding of the plan provided.   I have spent a total of 30 minutes minutes of face-to-face and non-face-to-face time, preparing to see the  patient, performing a medically appropriate examination, counseling and educating the patient, ordering medications, documenting clinical information in the electronic health record, and care coordination.   Georga Kaufmann PA-C Dept of Hematology and Oncology [facility]

## 2023-01-28 NOTE — Progress Notes (Signed)
Tolerating Lenvima with no associated side effects

## 2023-01-30 ENCOUNTER — Other Ambulatory Visit: Payer: Self-pay

## 2023-02-01 ENCOUNTER — Other Ambulatory Visit: Payer: Self-pay | Admitting: *Deleted

## 2023-02-01 ENCOUNTER — Encounter: Payer: Self-pay | Admitting: Hematology

## 2023-02-01 MED ORDER — AMLODIPINE BESYLATE 5 MG PO TABS: 5.0000 mg | ORAL_TABLET | Freq: Every day | ORAL | 1 refills | Status: DC

## 2023-02-01 NOTE — Progress Notes (Signed)
[facility] Clinical Social Work  Initial Assessment   David [name] is a 72 y.o. year old male presenting alone. Clinical Social Work was referred by medical provider for assessment of psychosocial needs.   SDOH (Social Determinants of Health) assessments performed: Yes SDOH Interventions    Flowsheet Row Office Visit from 01/12/2023 in [facility]  SDOH Interventions   Food Insecurity Interventions Intervention Not Indicated  Housing Interventions Intervention Not Indicated  Transportation Interventions Intervention Not Indicated  Utilities Interventions Intervention Not Indicated       SDOH Screenings   Food Insecurity: No Food Insecurity (01/12/2023)  Housing: Low Risk  (01/12/2023)  Transportation Needs: No Transportation Needs (01/12/2023)  Utilities: Not At Risk (01/12/2023)  Depression (PHQ2-9): Low Risk  (01/12/2023)  Tobacco Use: High Risk (01/28/2023)     Distress Screen completed: No     No data to display            Family/Social Information:  Housing Arrangement: patient lives alone Family members/support persons in your life? Pt states he has a niece residing locally who is able to sometimes assist w/ transportation and a sister who resides in [place].  Pt does not drive and is independent in ADLs.  Pt utilizing [employer] for transportation. Transportation concerns: no  Employment: Retired worked [employer] prior to retirement.  Income source: Actor concerns: Yes, current concerns Type of concern: Utilities, Rent/ mortgage, and Medical bills Food access concerns: no Religious or spiritual practice: Not known Services Currently in place:  none  Coping/ Adjustment to diagnosis: Patient understands treatment plan and what happens next? yes Concerns about diagnosis and/or treatment: How I will pay for the services I need, How will I care for myself, and Quality of life Patient reported stressors: Finances and  Adjusting to my illness Hopes and/or priorities: Pt's priority is to start treatment w/ the hope of positive results Patient enjoys  not addressed Current coping skills/ strengths: Capable of independent living , Motivation for treatment/growth , and Physical Health     SUMMARY: Current SDOH Barriers:  Financial constraints related to fixed income.  Pt is also predominantly reliant on public transport as he does not drive.  Clinical Social Work Clinical Goal(s):  Explore community resource options for unmet needs related to:  Financial Strain   Interventions: Discussed common feeling and emotions when being diagnosed with cancer, and the importance of support during treatment Informed patient of the support team roles and support services at [facility] Provided CSW contact information and encouraged patient to call with any questions or concerns Provided patient with information about the [employer] and acknowledgment signed.  Spoke to pt about the [employer] in reference to medical bills.  Pt is already connected to [employer].    Follow Up Plan: Patient will contact CSW with any support or resource needs Patient verbalizes understanding of plan: Yes    Rachel Moulds, LCSW Clinical Social Worker [facility]

## 2023-02-02 ENCOUNTER — Encounter (HOSPITAL_COMMUNITY): Payer: Self-pay | Admitting: Surgery

## 2023-02-04 ENCOUNTER — Other Ambulatory Visit (HOSPITAL_COMMUNITY): Payer: Self-pay

## 2023-02-04 ENCOUNTER — Ambulatory Visit: Payer: Medicare PPO

## 2023-02-04 ENCOUNTER — Ambulatory Visit: Payer: Medicare PPO | Admitting: Hematology

## 2023-02-04 ENCOUNTER — Other Ambulatory Visit: Payer: Medicare PPO

## 2023-02-05 ENCOUNTER — Ambulatory Visit (HOSPITAL_COMMUNITY)
Admission: RE | Admit: 2023-02-05 | Discharge: 2023-02-05 | Disposition: A | Discharge status: Home / Self Care | Payer: Medicare PPO | Source: Ambulatory Visit | Attending: Hematology | Admitting: Hematology

## 2023-02-05 DIAGNOSIS — G9389 Other specified disorders of brain: Secondary | ICD-10-CM | POA: Diagnosis not present

## 2023-02-05 DIAGNOSIS — C642 Malignant neoplasm of left kidney, except renal pelvis: Secondary | ICD-10-CM | POA: Diagnosis not present

## 2023-02-05 MED ORDER — GADOBUTROL 1 MMOL/ML IV SOLN
10.0000 mL | Freq: Once | INTRAVENOUS | Status: AC | PRN
  Administered 2023-02-05: 10 mL via INTRAVENOUS

## 2023-02-12 ENCOUNTER — Other Ambulatory Visit: Payer: Self-pay

## 2023-02-12 DIAGNOSIS — C642 Malignant neoplasm of left kidney, except renal pelvis: Secondary | ICD-10-CM

## 2023-02-14 ENCOUNTER — Other Ambulatory Visit: Payer: Self-pay

## 2023-02-15 ENCOUNTER — Inpatient Hospital Stay: Payer: Medicare PPO | Admitting: Dietician

## 2023-02-15 ENCOUNTER — Other Ambulatory Visit: Payer: Self-pay

## 2023-02-15 ENCOUNTER — Other Ambulatory Visit (HOSPITAL_COMMUNITY): Payer: Self-pay

## 2023-02-15 ENCOUNTER — Inpatient Hospital Stay: Payer: Medicare PPO | Attending: Hematology

## 2023-02-15 ENCOUNTER — Inpatient Hospital Stay: Payer: Medicare PPO

## 2023-02-15 ENCOUNTER — Inpatient Hospital Stay (HOSPITAL_BASED_OUTPATIENT_CLINIC_OR_DEPARTMENT_OTHER): Payer: Medicare PPO | Admitting: Hematology

## 2023-02-15 VITALS — BP 149/78 | HR 67 | Temp 98.2°F | Resp 20 | Wt 240.2 lb

## 2023-02-15 VITALS — BP 156/82

## 2023-02-15 DIAGNOSIS — C7972 Secondary malignant neoplasm of left adrenal gland: Secondary | ICD-10-CM | POA: Diagnosis not present

## 2023-02-15 DIAGNOSIS — D509 Iron deficiency anemia, unspecified: Secondary | ICD-10-CM | POA: Insufficient documentation

## 2023-02-15 DIAGNOSIS — Z5112 Encounter for antineoplastic immunotherapy: Secondary | ICD-10-CM | POA: Insufficient documentation

## 2023-02-15 DIAGNOSIS — C642 Malignant neoplasm of left kidney, except renal pelvis: Secondary | ICD-10-CM | POA: Diagnosis not present

## 2023-02-15 DIAGNOSIS — Z95828 Presence of other vascular implants and grafts: Secondary | ICD-10-CM

## 2023-02-15 DIAGNOSIS — R748 Abnormal levels of other serum enzymes: Secondary | ICD-10-CM | POA: Insufficient documentation

## 2023-02-15 DIAGNOSIS — D508 Other iron deficiency anemias: Secondary | ICD-10-CM | POA: Diagnosis not present

## 2023-02-15 DIAGNOSIS — Z79620 Long term (current) use of immunosuppressive biologic: Secondary | ICD-10-CM | POA: Insufficient documentation

## 2023-02-15 LAB — CBC WITH DIFFERENTIAL/PLATELET
Abs Immature Granulocytes: 0.04 10*3/uL (ref 0.00–0.07)
Basophils Absolute: 0 10*3/uL (ref 0.0–0.1)
Basophils Relative: 0 %
Eosinophils Absolute: 0.3 10*3/uL (ref 0.0–0.5)
Eosinophils Relative: 3 %
HCT: 33.6 % — ABNORMAL LOW (ref 39.0–52.0)
Hemoglobin: 11.3 g/dL — ABNORMAL LOW (ref 13.0–17.0)
Immature Granulocytes: 1 %
Lymphocytes Relative: 65 %
Lymphs Abs: 5.2 10*3/uL — ABNORMAL HIGH (ref 0.7–4.0)
MCH: 34.1 pg — ABNORMAL HIGH (ref 26.0–34.0)
MCHC: 33.6 g/dL (ref 30.0–36.0)
MCV: 101.5 fL — ABNORMAL HIGH (ref 80.0–100.0)
Monocytes Absolute: 1.5 10*3/uL — ABNORMAL HIGH (ref 0.1–1.0)
Monocytes Relative: 19 %
Neutro Abs: 1 10*3/uL — ABNORMAL LOW (ref 1.7–7.7)
Neutrophils Relative %: 12 %
Platelets: 175 10*3/uL (ref 150–400)
RBC: 3.31 MIL/uL — ABNORMAL LOW (ref 4.22–5.81)
RDW: 20.1 % — ABNORMAL HIGH (ref 11.5–15.5)
WBC: 7.9 10*3/uL (ref 4.0–10.5)
nRBC: 0.3 % — ABNORMAL HIGH (ref 0.0–0.2)

## 2023-02-15 LAB — MAGNESIUM: Magnesium: 1.9 mg/dL (ref 1.7–2.4)

## 2023-02-15 LAB — COMPREHENSIVE METABOLIC PANEL
ALT: 86 U/L — ABNORMAL HIGH (ref 0–44)
AST: 83 U/L — ABNORMAL HIGH (ref 15–41)
Albumin: 3.6 g/dL (ref 3.5–5.0)
Alkaline Phosphatase: 85 U/L (ref 38–126)
Anion gap: 8 (ref 5–15)
BUN: 24 mg/dL — ABNORMAL HIGH (ref 8–23)
CO2: 20 mmol/L — ABNORMAL LOW (ref 22–32)
Calcium: 8.4 mg/dL — ABNORMAL LOW (ref 8.9–10.3)
Chloride: 105 mmol/L (ref 98–111)
Creatinine, Ser: 1.18 mg/dL (ref 0.61–1.24)
GFR, Estimated: >60 mL/min (ref >60)
Glucose, Bld: 125 mg/dL — ABNORMAL HIGH (ref 70–99)
Potassium: 4 mmol/L (ref 3.5–5.1)
Sodium: 133 mmol/L — ABNORMAL LOW (ref 135–145)
Total Bilirubin: 0.9 mg/dL (ref 0.3–1.2)
Total Protein: 7 g/dL (ref 6.5–8.1)

## 2023-02-15 LAB — TSH: TSH: 8.327 u[IU]/mL — ABNORMAL HIGH (ref 0.350–4.500)

## 2023-02-15 MED ORDER — LENVATINIB (14 MG DAILY DOSE) 10 & 4 MG PO CPPK
14.0000 mg | ORAL_CAPSULE | Freq: Every day | ORAL | 0 refills | Status: DC
  Filled 2023-02-15: qty 60, 60d supply, fill #0
  Filled 2023-02-19: qty 60, 30d supply, fill #0

## 2023-02-15 MED ORDER — SODIUM CHLORIDE 0.9% FLUSH
10.0000 mL | INTRAVENOUS | Status: DC | PRN
  Administered 2023-02-15: 10 mL

## 2023-02-15 MED ORDER — HEPARIN SOD (PORK) LOCK FLUSH 100 UNIT/ML IV SOLN
500.0000 [IU] | Freq: Once | INTRAVENOUS | Status: AC | PRN
  Administered 2023-02-15: 500 [IU]

## 2023-02-15 MED ORDER — SODIUM CHLORIDE 0.9 % IV SOLN: Freq: Once | INTRAVENOUS | Status: AC

## 2023-02-15 MED ORDER — AMLODIPINE BESYLATE 10 MG PO TABS: 10.0000 mg | ORAL_TABLET | Freq: Every day | ORAL | 6 refills | Status: DC

## 2023-02-15 MED ORDER — SODIUM CHLORIDE 0.9% FLUSH
10.0000 mL | Freq: Once | INTRAVENOUS | Status: AC
  Administered 2023-02-15: 10 mL via INTRAVENOUS

## 2023-02-15 MED ORDER — SODIUM CHLORIDE 0.9 % IV SOLN
200.0000 mg | Freq: Once | INTRAVENOUS | Status: AC
  Administered 2023-02-15: 200 mg via INTRAVENOUS
  Filled 2023-02-15: qty 8

## 2023-02-15 NOTE — Progress Notes (Signed)
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 He is receiving oral lenvatinib + keytruda q21d (fist 5/10).  Past medical history reviewed   Met with patient in infusion. He reports tolerating treatment well. Patient has a good appetite. Reports 3 meals daily. Recalls bacon and egg sandwich for breakfast, brunswick stew for lunch, chicken and rice for dinner. Patient will snack on fruit. He is drinking 6-8 bottles of water daily. Patient reports 50+ lb intentional weight loss in the last few years with diet and exercise. He would like to get down to 225 lb.   Nutrition Focused Physical Exam: deferred   Medications: amlodipine, compazine    Labs: Na 133, glucose 125, BUN 24, Ca 8.4   Anthropometrics:   Height: 5'11.5" Weight: 240 lb 3.2 oz  UBW: 256 lb (08/10/22) BMI: 33.03   NUTRITION DIAGNOSIS: Food and nutrition related knowledge deficit related to cancer as evidenced by no prior need for associated nutrition information    INTERVENTION:  Educated on importance of adequate calories and protein to maintain strength/weights during treatment Encouraged high calorie high protein snacks in between meals - handout with ideas provided Contact information provided    MONITORING, EVALUATION, GOAL: weight trends, intake    Next Visit: To be scheduled as needed

## 2023-02-15 NOTE — Progress Notes (Signed)
[name] [address], [place] [address]    Clinic Day:  02/15/2023  Referring physician: Assunta Found, MD  Patient Care Team: David Found, MD as PCP - General (Family Medicine) David Massed, MD as Medical Oncologist (Medical Oncology) David Sarah, RN as Oncology Nurse Navigator (Medical Oncology)   ASSESSMENT & PLAN:   Assessment: 1.  Metastatic left kidney clear-cell RCC: - Presentation with left shoulder pain.  Weight loss of 50 pounds in the last 6 months but was trying to lose weight by cutting back on eating and doing some walking. - Left robotic assisted laparoscopic radical nephrectomy by Dr. Ronne [name] on 08/13/2022 - Pathology: 7 cm clear-cell RCC with focal sarcomatoid and rhabdoid features, nuclear grade 4.  Tumor extends into the renal vein and renal sinus fat (PT3a).  Ureteral, vascular and all margins negative for tumor. - PET scan (12/24/2022): Enlarging hypermetabolic left adrenal metastasis.  2 small hypermetabolic soft tissue nodules in the left nephrectomy bed.  Hypermetabolic lytic lesions involving distal left clavicle and left temporal bone.  Indeterminate small hypermetabolic parotid nodules bilaterally. - IMDC: At least intermediate pending labs - Lenvatinib 14 mg daily started on 01/18/2023, Keytruda on 01/22/2023, dose decreased to 10 mg on 01/28/2023 due to elevated blood pressure   2.  Social/family history: - Lives by himself and is independent of ADLs and IADLs.  He does not drive.  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  Current active smoker, half pack per day, started at age 24. - Mother might have had cancer, he is not sure.    Plan: 1.  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 - Reviewed MRI of the brain from 02/05/2023: No intracranial metastatic disease.  Left temporal bone lesion. - Reviewed labs today:  AST and ALT elevated at 83 and 86.  Rest of the LFTs and creatinine were normal.  CBC grossly normal with microcytic anemia.  TSH is 8.327. - He will proceed with Keytruda today.  Continue lenvatinib 10 mg daily.  RTC 3 weeks for follow-up.   2.  Hypertension: -Blood pressure is 166/81 despite decreasing the dose of lenvatinib. - He is taking Norvasc 5 mg daily. - Will increase Norvasc to 10 mg daily.   3. Left shoulder pain: - He is not requiring any pain medication at this time. - Scheduled for consultation with [name] radiation oncology on 02/17/2023.    Orders Placed This Encounter  Procedures   Iron and TIBC ([facility] ONLY)    Standing Status:   Future    Standing Expiration Date:   02/15/2024   Ferritin    Standing Status:   Future    Standing Expiration Date:   02/15/2024      I,David [name],acting as a scribe for David Massed, MD.,have documented all relevant documentation on the behalf of David Massed, MD,as directed by  David Massed, MD while in the presence of David Massed, MD.   I, David Massed MD, have reviewed the above documentation for accuracy and completeness, and I agree with the above.   David Massed, MD   6/3/20245:26 PM  CHIEF COMPLAINT:   Diagnosis: clear cell renal cell carcinoma    Cancer Staging  Clear cell renal cell carcinoma, left [name]) Staging form: Kidney, AJCC 8th Edition - Clinical stage from 01/12/2023: Stage IV (cT3a, cNX, cM1) - Unsigned    Prior Therapy: left nephrectomy 08/13/22  Current Therapy:  Lenvatinib and pembrolizumab    HISTORY OF PRESENT ILLNESS:   Oncology History  Clear cell renal cell carcinoma, left (HCC)  01/12/2023 Initial Diagnosis   Clear cell renal cell carcinoma, left (HCC)   01/22/2023 -  Chemotherapy   Patient is on Treatment Plan : KIDNEY Pembrolizumab (200) q21d        INTERVAL HISTORY:   David [name] is a 72 y.o. male presenting to clinic today  for follow up of clear cell renal cell carcinoma. He was last seen by PA David [name] on 01/28/23.  Since his last visit, he underwent staging brain MRI on 02/05/23 showing: no evidence of intracranial metastatic disease; enhancing left temporal bone lesion, which was hypermetabolic on prior PET and remains suspicious for metastatic disease.  Today, he states that he is doing well overall. His appetite level is at 85%. His energy level is at 70%.  PAST MEDICAL HISTORY:   Past Medical History: Past Medical History:  Diagnosis Date   Chronic kidney disease    Edentulous    has full dentures   Granulocytopenia (HCC)    Obstructive sleep apnea    transcribed from [name] note   Pneumonia    Seizure [facility])     Surgical History: Past Surgical History:  Procedure Laterality Date   COLONOSCOPY  05/31/2012   Procedure: COLONOSCOPY;  Surgeon: David Heading, MD;  Location: [facility];  Service: Gastroenterology;  Laterality: N/A;   PORTACATH PLACEMENT Right 01/27/2023   Procedure: INSERTION PORT-A-CATH, RIJ;  Surgeon: David Chamber, DO;  Location: [facility] ORS;  Service: General;  Laterality: Right;   ROBOT ASSISTED LAPAROSCOPIC NEPHRECTOMY Left 08/13/2022   Procedure: XI ROBOTIC ASSISTED LAPAROSCOPIC NEPHRECTOMY;  Surgeon: David Gauze, MD;  Location: [facility] ORS;  Service: Urology;  Laterality: Left;   TONSILLECTOMY      Social History: Social History   Socioeconomic History   Marital status: Divorced    Spouse name: Not on file   Number of children: Not on file   Years of education: Not on file   Highest education level: Not on file  Occupational History   Not on file  Tobacco Use   Smoking status: Every Day    Packs/day: 0.50    Years: 65.00    Additional pack years: 0.00    Total pack years: 32.50    Types: Cigarettes   Smokeless tobacco: Never  Vaping Use   Vaping Use: Never used  Substance and Sexual Activity   Alcohol use: No   Drug use: No   Sexual  activity: Not Currently  Other Topics Concern   Not on file  Social History Narrative   Not on file   Social Determinants of Health   Financial Resource Strain: High Risk (02/01/2023)   Overall Financial Resource Strain (CARDIA)    Difficulty of Paying Living Expenses: Very hard  Food Insecurity: No Food Insecurity (01/12/2023)   Hunger Vital Sign    Worried About Running Out of Food in the Last Year: Never true    Ran Out of Food in the Last Year: Never true  Transportation Needs: No Transportation Needs (01/12/2023)   PRAPARE - Administrator, Civil Service (Medical): No    Lack of Transportation (Non-Medical): No  Physical Activity: Not on file  Stress: Not on file  Social Connections: Not on file  Intimate Partner Violence: Not At Risk (01/12/2023)   Humiliation, Afraid, Rape, and Kick questionnaire    [name] of Current  or Ex-Partner: No    Emotionally Abused: No    Physically Abused: No    Sexually Abused: No    Family History: No family history on file.  Current Medications:  Current Outpatient Medications:    lidocaine-prilocaine (EMLA) cream, Apply to affected area once, Disp: 30 g, Rfl: 3   Pembrolizumab (KEYTRUDA IV), Inject into the vein every 21 ( twenty-one) days., Disp: , Rfl:    prochlorperazine (COMPAZINE) 10 MG tablet, Take 1 tablet (10 mg total) by mouth every 6 (six) hours as needed for nausea or vomiting., Disp: 30 tablet, Rfl: 1   tamsulosin (FLOMAX) 0.4 MG CAPS capsule, Take 0.4 mg by mouth daily. , Disp: , Rfl:    amLODipine (NORVASC) 10 MG tablet, Take 1 tablet (10 mg total) by mouth daily., Disp: 30 tablet, Rfl: 6   lenvatinib 14 mg daily dose (LENVIMA) 10 & 4 MG capsule, Take 14 mg by mouth daily. (Take one 10mg  capsule and one 4mg  capsule for a total dose of 14mg ), Disp: 60 capsule, Rfl: 0   Allergies: No Known Allergies  REVIEW OF SYSTEMS:   Review of Systems  Constitutional:  Negative for chills, fatigue and fever.  HENT:   Negative  for lump/mass, mouth sores, nosebleeds, sore throat and trouble swallowing.   Eyes:  Negative for eye problems.  Respiratory:  Negative for cough and shortness of breath.   Cardiovascular:  Negative for chest pain, leg swelling and palpitations.  Gastrointestinal:  Negative for abdominal pain, constipation, diarrhea, nausea and vomiting.  Genitourinary:  Negative for bladder incontinence, difficulty urinating, dysuria, frequency, hematuria and nocturia.   Musculoskeletal:  Positive for arthralgias. Negative for back pain, flank pain, myalgias and neck pain.  Skin:  Negative for itching and rash.  Neurological:  Negative for dizziness, headaches and numbness.  Hematological:  Does not bruise/bleed easily.  Psychiatric/Behavioral:  Positive for sleep disturbance. Negative for depression and suicidal ideas. The patient is not nervous/anxious.   All other systems reviewed and are negative.    VITALS:   Blood pressure (!) 156/82.  Wt Readings from Last 3 Encounters:  02/15/23 240 lb 3.2 oz (109 kg)  01/28/23 246 lb (111.6 kg)  01/25/23 241 lb 3.2 oz (109.4 kg)    There is no height or weight on file to calculate BMI.  Performance status (ECOG): 1 - Symptomatic but completely ambulatory  PHYSICAL EXAM:   Physical Exam Vitals and nursing note reviewed. Exam conducted with a chaperone present.  Constitutional:      Appearance: Normal appearance.  Cardiovascular:     Rate and Rhythm: Normal rate and regular rhythm.     Pulses: Normal pulses.     Heart sounds: Normal heart sounds.  Pulmonary:     Effort: Pulmonary effort is normal.     Breath sounds: Normal breath sounds.  Abdominal:     Palpations: Abdomen is soft. There is no hepatomegaly, splenomegaly or mass.     Tenderness: There is no abdominal tenderness.  Musculoskeletal:     Right lower leg: No edema.     Left lower leg: No edema.  Lymphadenopathy:     Cervical: No cervical adenopathy.     Right cervical: No superficial,  deep or posterior cervical adenopathy.    Left cervical: No superficial, deep or posterior cervical adenopathy.     Upper Body:     Right upper body: No supraclavicular or axillary adenopathy.     Left upper body: No supraclavicular or axillary adenopathy.  Neurological:  General: No focal deficit present.     Mental Status: He is alert and oriented to person, place, and time.  Psychiatric:        Mood and Affect: Mood normal.        Behavior: Behavior normal.     LABS:      Latest Ref Rng & Units 02/15/2023   11:43 AM 01/28/2023   10:11 AM 01/20/2023    1:13 PM  CBC  WBC 4.0 - 10.5 K/uL 7.9  9.6  8.3   Hemoglobin 13.0 - 17.0 g/dL 40.9  81.1  91.4   Hematocrit 39.0 - 52.0 % 33.6  35.4  34.2   Platelets 150 - 400 K/uL 175  206  233       Latest Ref Rng & Units 02/15/2023   11:43 AM 01/28/2023   10:11 AM 01/20/2023    1:13 PM  CMP  Glucose 70 - 99 mg/dL 782  956  213   BUN 8 - 23 mg/dL 24  22  24    Creatinine 0.61 - 1.24 mg/dL 0.86  5.78  4.69   Sodium 135 - 145 mmol/L 133  135  135   Potassium 3.5 - 5.1 mmol/L 4.0  4.2  4.3   Chloride 98 - 111 mmol/L 105  104  105   CO2 22 - 32 mmol/L 20  22  22    Calcium 8.9 - 10.3 mg/dL 8.4  8.8  9.0   Total Protein 6.5 - 8.1 g/dL 7.0  7.0  7.7   Total Bilirubin 0.3 - 1.2 mg/dL 0.9  1.0  1.0   Alkaline Phos 38 - 126 U/L 85  95  94   AST 15 - 41 U/L 83  90  51   ALT 0 - 44 U/L 86  86  56      No results [name] for: "CEA1", "CEA" / No results [name] for: "CEA1", "CEA" No results [name] for: "PSA1" No results [name] for: "GEX528" No results [name] for: "CAN125"  No results [name] for: "TOTALPROTELP", "ALBUMINELP", "A1GS", "A2GS", "BETS", "BETA2SER", "GAMS", "MSPIKE", "SPEI" Lab Results  Component Value Date   TIBC 250 01/20/2023   TIBC 271 04/03/2009   FERRITIN 655 (H) 01/20/2023   FERRITIN 870 (H) 04/03/2009   IRONPCTSAT 26 01/20/2023   IRONPCTSAT 72 (H) 04/03/2009   Lab Results  Component Value Date   LDH 248 (H) 01/20/2023      STUDIES:   MR Brain W Wo Contrast  Result Date: 02/13/2023 CLINICAL DATA:  Kidney cancer, staging EXAM: MRI HEAD WITHOUT AND WITH CONTRAST TECHNIQUE: Multiplanar, multiecho pulse sequences of the brain and surrounding structures were obtained without and with intravenous contrast. CONTRAST:  10mL GADAVIST GADOBUTROL 1 MMOL/ML IV SOLN COMPARISON:  PET CT December 24, 2022. FINDINGS: Brain: No acute infarction, hemorrhage, hydrocephalus, extra-axial collection or mass lesion. Vascular: Major arterial flow voids are maintained. Skull and upper cervical spine: Enhancing left temporal bone lesion. Sinuses/Orbits: Ethmoid and frontal sinus mucosal thickening. No acute orbital findings. Other: Small left mastoid effusion. IMPRESSION: 1. No evidence of acute intracranial abnormality or intracranial metastatic disease. 2. Enhancing left temporal bone lesion was hypermetabolic on prior PET CT and is suspicious for metastatic disease. 3. Frontoethmoidal paranasal sinus mucosal thickening. Electronically Signed   By: Feliberto Harts M.D.   On: 02/13/2023 15:33   DG Chest Port 1 View  Result Date: 01/27/2023 CLINICAL DATA:  [id number] Port-A-Cath in place [id number] EXAM: PORTABLE CHEST 1 VIEW COMPARISON:  CXR 11/19/22 FINDINGS:  Right-sided chest port in place with the tip in the upper SVC, possibly near the confluence of brachiocephalic veins. No pleural effusion. No pneumothorax. Normal cardiac and mediastinal contours. No radiographically apparent displaced rib fractures. Visualized upper abdomen is unremarkable IMPRESSION: Interval placement of a right-sided chest port.  No pneumothorax Electronically Signed   By: Lorenza Cambridge M.D.   On: 01/27/2023 09:06   DG C-Arm 1-60 Min-No Report  Result Date: 01/27/2023 Fluoroscopy was utilized by the requesting physician.  No radiographic interpretation.

## 2023-02-15 NOTE — Progress Notes (Signed)
Patient is taking lenvatinib  as prescribed.  He has not missed any doses and reports no side effects at this time.    Patient has been examined by Dr. Ellin Saba. Vital signs and labs have been reviewed by MD - ANC (1.0), Creatinine, LFTs (AST 83, ALT 86), hemoglobin, and platelets are within treatment parameters per M.D. - pt may proceed with treatment.  Primary RN and pharmacy notified.

## 2023-02-15 NOTE — Patient Instructions (Addendum)
[facility] Discharge Instructions   You were seen and examined today by Dr. Ellin Saba.  He reviewed the results of your MRI of the brain which was normal. No evidence of cancer in the brain.   He reviewed the results of your lab work which are normal/stable.   Continue lenvatinib as prescribed.   We will increase your blood pressure medication (amlodipine/Norvasc) to 10 mg daily. Take 2 of the pills you have now until you run out. We will send a new prescription for you for this.    Thank you for choosing  [facility] Center at [facility] to provide your oncology and hematology care.  To afford each patient quality time with our provider, please arrive at least 15 minutes before your scheduled appointment time.   If you have a lab appointment with the Cancer Center please come in thru the Main Entrance and check in at the main information desk.  You need to re-schedule your appointment should you arrive 10 or more minutes late.  We strive to give you quality time with our providers, and arriving late affects you and other patients whose appointments are after yours.  Also, if you no show three or more times for appointments you may be dismissed from the clinic at the providers discretion.     Again, thank you for choosing [facility].  Our hope is that these requests will decrease the amount of time that you wait before being seen by our physicians.       _____________________________________________________________  Should you have questions after your visit to [facility], please contact our office at [phone] and follow the prompts.  Our office hours are 8:00 a.m. and 4:30 p.m. Monday - Friday.  Please note that voicemails left after 4:00 p.m. may not be returned until the following business day.  We are closed weekends and major holidays.  You do have access to a nurse 24-7, just call the main number to  the clinic [phone] and do not press any options, hold on the line and a nurse will answer the phone.    For prescription refill requests, have your pharmacy contact our office and allow 72 hours.    Due to Covid, you will need to wear a mask upon entering the hospital. If you do not have a mask, a mask will be given to you at the Main Entrance upon arrival. For doctor visits, patients may have 1 support person age 71 or older with them. For treatment visits, patients can not have anyone with them due to social distancing guidelines and our immunocompromised population.

## 2023-02-15 NOTE — Progress Notes (Signed)
Labs reviewed with MD today. Ok to treat with ANC of 1.0 per MD.   Also ok to treat per MD with liver function elevated.   Treatment given per orders. Patient tolerated it well without problems. Vitals stable and discharged home from clinic ambulatory. Follow up as scheduled.

## 2023-02-15 NOTE — Patient Instructions (Signed)
[facility]  Discharge Instructions: Thank you for choosing [facility] to provide your oncology and hematology care.  If you have a lab appointment with the Cancer Center - please note that after April 8th, 2024, all labs will be drawn in the cancer center.  You do not have to check in or register with the main entrance as you have in the past but will complete your check-in in the cancer center.  Wear comfortable clothing and clothing appropriate for easy access to any Portacath or PICC line.   We strive to give you quality time with your provider. You may need to reschedule your appointment if you arrive late (15 or more minutes).  Arriving late affects you and other patients whose appointments are after yours.  Also, if you miss three or more appointments without notifying the office, you may be dismissed from the clinic at the provider's discretion.      For prescription refill requests, have your pharmacy contact our office and allow 72 hours for refills to be completed.    Today you received the following chemotherapy and/or immunotherapy agents Keytruda      To help prevent nausea and vomiting after your treatment, we encourage you to take your nausea medication as directed.  BELOW ARE SYMPTOMS THAT SHOULD BE REPORTED IMMEDIATELY: *FEVER GREATER THAN 100.4 F (38 C) OR HIGHER *CHILLS OR SWEATING *NAUSEA AND VOMITING THAT IS NOT CONTROLLED WITH YOUR NAUSEA MEDICATION *UNUSUAL SHORTNESS OF BREATH *UNUSUAL BRUISING OR BLEEDING *URINARY PROBLEMS (pain or burning when urinating, or frequent urination) *BOWEL PROBLEMS (unusual diarrhea, constipation, pain near the anus) TENDERNESS IN MOUTH AND THROAT WITH OR WITHOUT PRESENCE OF ULCERS (sore throat, sores in mouth, or a toothache) UNUSUAL RASH, SWELLING OR PAIN  UNUSUAL VAGINAL DISCHARGE OR ITCHING   Items with * indicate a potential emergency and should be followed up as soon as possible or go to the  Emergency Department if any problems should occur.  Please show the CHEMOTHERAPY ALERT CARD or IMMUNOTHERAPY ALERT CARD at check-in to the Emergency Department and triage nurse.  Should you have questions after your visit or need to cancel or reschedule your appointment, please contact [facility] [phone]  and follow the prompts.  Office hours are 8:00 a.m. to 4:30 p.m. Monday - Friday. Please note that voicemails left after 4:00 p.m. may not be returned until the following business day.  We are closed weekends and major holidays. You have access to a nurse at all times for urgent questions. Please call the main number to the clinic [phone] and follow the prompts.  For any non-urgent questions, you may also contact your provider using MyChart. We now offer e-Visits for anyone 18 and older to request care online for non-urgent symptoms. For details visit [email or web address].   Also download the MyChart app! Go to the app store, search "MyChart", open the app, select [facility], and log in with your MyChart username and password.   

## 2023-02-17 ENCOUNTER — Other Ambulatory Visit: Payer: Self-pay

## 2023-02-17 DIAGNOSIS — C7951 Secondary malignant neoplasm of bone: Secondary | ICD-10-CM | POA: Diagnosis not present

## 2023-02-17 DIAGNOSIS — C642 Malignant neoplasm of left kidney, except renal pelvis: Secondary | ICD-10-CM | POA: Diagnosis not present

## 2023-02-19 ENCOUNTER — Other Ambulatory Visit: Payer: Self-pay

## 2023-02-20 ENCOUNTER — Other Ambulatory Visit (HOSPITAL_COMMUNITY): Payer: Self-pay

## 2023-02-22 ENCOUNTER — Other Ambulatory Visit: Payer: Self-pay

## 2023-02-22 ENCOUNTER — Other Ambulatory Visit (HOSPITAL_COMMUNITY): Payer: Self-pay

## 2023-03-07 DIAGNOSIS — S066XAA Traumatic subarachnoid hemorrhage with loss of consciousness status unknown, initial encounter: Secondary | ICD-10-CM | POA: Diagnosis not present

## 2023-03-07 DIAGNOSIS — S42032A Displaced fracture of lateral end of left clavicle, initial encounter for closed fracture: Secondary | ICD-10-CM | POA: Diagnosis not present

## 2023-03-07 DIAGNOSIS — G4489 Other headache syndrome: Secondary | ICD-10-CM | POA: Diagnosis not present

## 2023-03-07 DIAGNOSIS — S0003XA Contusion of scalp, initial encounter: Secondary | ICD-10-CM | POA: Diagnosis not present

## 2023-03-07 DIAGNOSIS — C7972 Secondary malignant neoplasm of left adrenal gland: Secondary | ICD-10-CM | POA: Diagnosis not present

## 2023-03-07 DIAGNOSIS — W01198A Fall on same level from slipping, tripping and stumbling with subsequent striking against other object, initial encounter: Secondary | ICD-10-CM | POA: Diagnosis not present

## 2023-03-07 DIAGNOSIS — D649 Anemia, unspecified: Secondary | ICD-10-CM | POA: Diagnosis not present

## 2023-03-07 DIAGNOSIS — R58 Hemorrhage, not elsewhere classified: Secondary | ICD-10-CM | POA: Diagnosis not present

## 2023-03-07 DIAGNOSIS — S065X0A Traumatic subdural hemorrhage without loss of consciousness, initial encounter: Secondary | ICD-10-CM | POA: Diagnosis not present

## 2023-03-07 DIAGNOSIS — F1010 Alcohol abuse, uncomplicated: Secondary | ICD-10-CM | POA: Diagnosis not present

## 2023-03-07 DIAGNOSIS — M898X1 Other specified disorders of bone, shoulder: Secondary | ICD-10-CM | POA: Diagnosis not present

## 2023-03-07 DIAGNOSIS — M129 Arthropathy, unspecified: Secondary | ICD-10-CM | POA: Diagnosis not present

## 2023-03-07 DIAGNOSIS — S066X9A Traumatic subarachnoid hemorrhage with loss of consciousness of unspecified duration, initial encounter: Secondary | ICD-10-CM | POA: Diagnosis not present

## 2023-03-07 DIAGNOSIS — S065X9A Traumatic subdural hemorrhage with loss of consciousness of unspecified duration, initial encounter: Secondary | ICD-10-CM | POA: Diagnosis not present

## 2023-03-07 DIAGNOSIS — S066X0A Traumatic subarachnoid hemorrhage without loss of consciousness, initial encounter: Secondary | ICD-10-CM | POA: Diagnosis not present

## 2023-03-07 DIAGNOSIS — S199XXA Unspecified injury of neck, initial encounter: Secondary | ICD-10-CM | POA: Diagnosis not present

## 2023-03-07 DIAGNOSIS — S42035A Nondisplaced fracture of lateral end of left clavicle, initial encounter for closed fracture: Secondary | ICD-10-CM | POA: Diagnosis not present

## 2023-03-07 DIAGNOSIS — S065XAA Traumatic subdural hemorrhage with loss of consciousness status unknown, initial encounter: Secondary | ICD-10-CM | POA: Diagnosis not present

## 2023-03-07 DIAGNOSIS — I080 Rheumatic disorders of both mitral and aortic valves: Secondary | ICD-10-CM | POA: Diagnosis not present

## 2023-03-07 DIAGNOSIS — C642 Malignant neoplasm of left kidney, except renal pelvis: Secondary | ICD-10-CM | POA: Diagnosis not present

## 2023-03-07 DIAGNOSIS — Z79899 Other long term (current) drug therapy: Secondary | ICD-10-CM | POA: Diagnosis not present

## 2023-03-07 DIAGNOSIS — M84412A Pathological fracture, left shoulder, initial encounter for fracture: Secondary | ICD-10-CM | POA: Diagnosis not present

## 2023-03-07 DIAGNOSIS — S0101XA Laceration without foreign body of scalp, initial encounter: Secondary | ICD-10-CM | POA: Diagnosis not present

## 2023-03-07 DIAGNOSIS — C7951 Secondary malignant neoplasm of bone: Secondary | ICD-10-CM | POA: Diagnosis not present

## 2023-03-07 DIAGNOSIS — F17210 Nicotine dependence, cigarettes, uncomplicated: Secondary | ICD-10-CM | POA: Diagnosis not present

## 2023-03-07 DIAGNOSIS — E871 Hypo-osmolality and hyponatremia: Secondary | ICD-10-CM | POA: Diagnosis not present

## 2023-03-07 DIAGNOSIS — N400 Enlarged prostate without lower urinary tract symptoms: Secondary | ICD-10-CM | POA: Diagnosis not present

## 2023-03-07 DIAGNOSIS — S0990XA Unspecified injury of head, initial encounter: Secondary | ICD-10-CM | POA: Diagnosis not present

## 2023-03-07 DIAGNOSIS — W19XXXA Unspecified fall, initial encounter: Secondary | ICD-10-CM | POA: Diagnosis not present

## 2023-03-07 DIAGNOSIS — I10 Essential (primary) hypertension: Secondary | ICD-10-CM | POA: Diagnosis not present

## 2023-03-08 ENCOUNTER — Inpatient Hospital Stay: Payer: Medicare PPO

## 2023-03-08 ENCOUNTER — Inpatient Hospital Stay: Payer: Medicare PPO | Admitting: Hematology

## 2023-03-09 ENCOUNTER — Other Ambulatory Visit: Payer: Self-pay

## 2023-03-14 DIAGNOSIS — S065X0D Traumatic subdural hemorrhage without loss of consciousness, subsequent encounter: Secondary | ICD-10-CM | POA: Diagnosis not present

## 2023-03-14 DIAGNOSIS — C7492 Malignant neoplasm of unspecified part of left adrenal gland: Secondary | ICD-10-CM | POA: Diagnosis not present

## 2023-03-14 DIAGNOSIS — S066X0D Traumatic subarachnoid hemorrhage without loss of consciousness, subsequent encounter: Secondary | ICD-10-CM | POA: Diagnosis not present

## 2023-03-14 DIAGNOSIS — C642 Malignant neoplasm of left kidney, except renal pelvis: Secondary | ICD-10-CM | POA: Diagnosis not present

## 2023-03-14 DIAGNOSIS — S0181XD Laceration without foreign body of other part of head, subsequent encounter: Secondary | ICD-10-CM | POA: Diagnosis not present

## 2023-03-14 DIAGNOSIS — D630 Anemia in neoplastic disease: Secondary | ICD-10-CM | POA: Diagnosis not present

## 2023-03-14 DIAGNOSIS — C7951 Secondary malignant neoplasm of bone: Secondary | ICD-10-CM | POA: Diagnosis not present

## 2023-03-14 DIAGNOSIS — G40909 Epilepsy, unspecified, not intractable, without status epilepticus: Secondary | ICD-10-CM | POA: Diagnosis not present

## 2023-03-14 DIAGNOSIS — M84512D Pathological fracture in neoplastic disease, left shoulder, subsequent encounter for fracture with routine healing: Secondary | ICD-10-CM | POA: Diagnosis not present

## 2023-03-15 DIAGNOSIS — S066X0D Traumatic subarachnoid hemorrhage without loss of consciousness, subsequent encounter: Secondary | ICD-10-CM | POA: Diagnosis not present

## 2023-03-15 DIAGNOSIS — S0181XD Laceration without foreign body of other part of head, subsequent encounter: Secondary | ICD-10-CM | POA: Diagnosis not present

## 2023-03-15 DIAGNOSIS — C7951 Secondary malignant neoplasm of bone: Secondary | ICD-10-CM | POA: Diagnosis not present

## 2023-03-15 DIAGNOSIS — C642 Malignant neoplasm of left kidney, except renal pelvis: Secondary | ICD-10-CM | POA: Diagnosis not present

## 2023-03-15 DIAGNOSIS — G40909 Epilepsy, unspecified, not intractable, without status epilepticus: Secondary | ICD-10-CM | POA: Diagnosis not present

## 2023-03-15 DIAGNOSIS — C7492 Malignant neoplasm of unspecified part of left adrenal gland: Secondary | ICD-10-CM | POA: Diagnosis not present

## 2023-03-15 DIAGNOSIS — S065X0D Traumatic subdural hemorrhage without loss of consciousness, subsequent encounter: Secondary | ICD-10-CM | POA: Diagnosis not present

## 2023-03-15 DIAGNOSIS — D630 Anemia in neoplastic disease: Secondary | ICD-10-CM | POA: Diagnosis not present

## 2023-03-15 DIAGNOSIS — M84512D Pathological fracture in neoplastic disease, left shoulder, subsequent encounter for fracture with routine healing: Secondary | ICD-10-CM | POA: Diagnosis not present

## 2023-03-16 ENCOUNTER — Other Ambulatory Visit: Payer: Self-pay

## 2023-03-16 ENCOUNTER — Other Ambulatory Visit: Payer: Self-pay | Admitting: Hematology

## 2023-03-16 ENCOUNTER — Encounter: Payer: Self-pay | Admitting: *Deleted

## 2023-03-16 ENCOUNTER — Other Ambulatory Visit (HOSPITAL_COMMUNITY): Payer: Self-pay

## 2023-03-16 MED ORDER — LENVATINIB (14 MG DAILY DOSE) 10 & 4 MG PO CPPK
14.0000 mg | ORAL_CAPSULE | Freq: Every day | ORAL | 0 refills | Status: DC
  Filled 2023-03-16: qty 60, 30d supply, fill #0
  Filled 2023-04-15: qty 30, 30d supply, fill #1

## 2023-03-16 NOTE — Progress Notes (Signed)
Lenvatinib refill approved.  Patient is tolerating and is to continue therapy.

## 2023-03-17 DIAGNOSIS — D630 Anemia in neoplastic disease: Secondary | ICD-10-CM | POA: Diagnosis not present

## 2023-03-17 DIAGNOSIS — C7951 Secondary malignant neoplasm of bone: Secondary | ICD-10-CM | POA: Diagnosis not present

## 2023-03-17 DIAGNOSIS — S065X0D Traumatic subdural hemorrhage without loss of consciousness, subsequent encounter: Secondary | ICD-10-CM | POA: Diagnosis not present

## 2023-03-17 DIAGNOSIS — C7492 Malignant neoplasm of unspecified part of left adrenal gland: Secondary | ICD-10-CM | POA: Diagnosis not present

## 2023-03-17 DIAGNOSIS — C642 Malignant neoplasm of left kidney, except renal pelvis: Secondary | ICD-10-CM | POA: Diagnosis not present

## 2023-03-17 DIAGNOSIS — S066X0D Traumatic subarachnoid hemorrhage without loss of consciousness, subsequent encounter: Secondary | ICD-10-CM | POA: Diagnosis not present

## 2023-03-17 DIAGNOSIS — S0181XD Laceration without foreign body of other part of head, subsequent encounter: Secondary | ICD-10-CM | POA: Diagnosis not present

## 2023-03-17 DIAGNOSIS — M84512D Pathological fracture in neoplastic disease, left shoulder, subsequent encounter for fracture with routine healing: Secondary | ICD-10-CM | POA: Diagnosis not present

## 2023-03-17 DIAGNOSIS — G40909 Epilepsy, unspecified, not intractable, without status epilepticus: Secondary | ICD-10-CM | POA: Diagnosis not present

## 2023-03-19 DIAGNOSIS — S065X0D Traumatic subdural hemorrhage without loss of consciousness, subsequent encounter: Secondary | ICD-10-CM | POA: Diagnosis not present

## 2023-03-19 DIAGNOSIS — S0181XD Laceration without foreign body of other part of head, subsequent encounter: Secondary | ICD-10-CM | POA: Diagnosis not present

## 2023-03-19 DIAGNOSIS — C7951 Secondary malignant neoplasm of bone: Secondary | ICD-10-CM | POA: Diagnosis not present

## 2023-03-19 DIAGNOSIS — S066X0D Traumatic subarachnoid hemorrhage without loss of consciousness, subsequent encounter: Secondary | ICD-10-CM | POA: Diagnosis not present

## 2023-03-19 DIAGNOSIS — C642 Malignant neoplasm of left kidney, except renal pelvis: Secondary | ICD-10-CM | POA: Diagnosis not present

## 2023-03-19 DIAGNOSIS — C7492 Malignant neoplasm of unspecified part of left adrenal gland: Secondary | ICD-10-CM | POA: Diagnosis not present

## 2023-03-19 DIAGNOSIS — D630 Anemia in neoplastic disease: Secondary | ICD-10-CM | POA: Diagnosis not present

## 2023-03-19 DIAGNOSIS — G40909 Epilepsy, unspecified, not intractable, without status epilepticus: Secondary | ICD-10-CM | POA: Diagnosis not present

## 2023-03-19 DIAGNOSIS — M84512D Pathological fracture in neoplastic disease, left shoulder, subsequent encounter for fracture with routine healing: Secondary | ICD-10-CM | POA: Diagnosis not present

## 2023-03-22 ENCOUNTER — Other Ambulatory Visit (HOSPITAL_COMMUNITY): Payer: Self-pay | Admitting: Internal Medicine

## 2023-03-22 ENCOUNTER — Ambulatory Visit (HOSPITAL_COMMUNITY)
Admission: RE | Admit: 2023-03-22 | Discharge: 2023-03-22 | Disposition: A | Discharge status: Home / Self Care | Payer: Medicare PPO | Source: Ambulatory Visit | Attending: Internal Medicine | Admitting: Internal Medicine

## 2023-03-22 ENCOUNTER — Other Ambulatory Visit (HOSPITAL_COMMUNITY): Payer: Self-pay

## 2023-03-22 DIAGNOSIS — I10 Essential (primary) hypertension: Secondary | ICD-10-CM | POA: Diagnosis not present

## 2023-03-22 DIAGNOSIS — C7492 Malignant neoplasm of unspecified part of left adrenal gland: Secondary | ICD-10-CM | POA: Diagnosis not present

## 2023-03-22 DIAGNOSIS — S065X0A Traumatic subdural hemorrhage without loss of consciousness, initial encounter: Secondary | ICD-10-CM | POA: Diagnosis not present

## 2023-03-22 DIAGNOSIS — Z6831 Body mass index (BMI) 31.0-31.9, adult: Secondary | ICD-10-CM | POA: Diagnosis not present

## 2023-03-22 DIAGNOSIS — S065XAA Traumatic subdural hemorrhage with loss of consciousness status unknown, initial encounter: Secondary | ICD-10-CM | POA: Diagnosis not present

## 2023-03-22 DIAGNOSIS — M84512D Pathological fracture in neoplastic disease, left shoulder, subsequent encounter for fracture with routine healing: Secondary | ICD-10-CM | POA: Diagnosis not present

## 2023-03-22 DIAGNOSIS — S0181XD Laceration without foreign body of other part of head, subsequent encounter: Secondary | ICD-10-CM | POA: Diagnosis not present

## 2023-03-22 DIAGNOSIS — G40909 Epilepsy, unspecified, not intractable, without status epilepticus: Secondary | ICD-10-CM | POA: Diagnosis not present

## 2023-03-22 DIAGNOSIS — N400 Enlarged prostate without lower urinary tract symptoms: Secondary | ICD-10-CM | POA: Diagnosis not present

## 2023-03-22 DIAGNOSIS — C7951 Secondary malignant neoplasm of bone: Secondary | ICD-10-CM | POA: Diagnosis not present

## 2023-03-22 DIAGNOSIS — S065X0D Traumatic subdural hemorrhage without loss of consciousness, subsequent encounter: Secondary | ICD-10-CM | POA: Diagnosis not present

## 2023-03-22 DIAGNOSIS — G40309 Generalized idiopathic epilepsy and epileptic syndromes, not intractable, without status epilepticus: Secondary | ICD-10-CM | POA: Diagnosis not present

## 2023-03-22 DIAGNOSIS — C642 Malignant neoplasm of left kidney, except renal pelvis: Secondary | ICD-10-CM | POA: Diagnosis not present

## 2023-03-22 DIAGNOSIS — S066X0D Traumatic subarachnoid hemorrhage without loss of consciousness, subsequent encounter: Secondary | ICD-10-CM | POA: Diagnosis not present

## 2023-03-22 DIAGNOSIS — E6609 Other obesity due to excess calories: Secondary | ICD-10-CM | POA: Diagnosis not present

## 2023-03-22 DIAGNOSIS — B182 Chronic viral hepatitis C: Secondary | ICD-10-CM | POA: Diagnosis not present

## 2023-03-22 DIAGNOSIS — D630 Anemia in neoplastic disease: Secondary | ICD-10-CM | POA: Diagnosis not present

## 2023-03-22 DIAGNOSIS — S42009A Fracture of unspecified part of unspecified clavicle, initial encounter for closed fracture: Secondary | ICD-10-CM | POA: Diagnosis not present

## 2023-03-22 DIAGNOSIS — I609 Nontraumatic subarachnoid hemorrhage, unspecified: Secondary | ICD-10-CM | POA: Diagnosis not present

## 2023-03-22 DIAGNOSIS — S0003XA Contusion of scalp, initial encounter: Secondary | ICD-10-CM | POA: Diagnosis not present

## 2023-03-23 DIAGNOSIS — S065XAA Traumatic subdural hemorrhage with loss of consciousness status unknown, initial encounter: Secondary | ICD-10-CM | POA: Diagnosis not present

## 2023-03-23 DIAGNOSIS — E6609 Other obesity due to excess calories: Secondary | ICD-10-CM | POA: Diagnosis not present

## 2023-03-23 DIAGNOSIS — S42009A Fracture of unspecified part of unspecified clavicle, initial encounter for closed fracture: Secondary | ICD-10-CM | POA: Diagnosis not present

## 2023-03-23 DIAGNOSIS — Z6831 Body mass index (BMI) 31.0-31.9, adult: Secondary | ICD-10-CM | POA: Diagnosis not present

## 2023-03-24 ENCOUNTER — Telehealth: Payer: Self-pay | Admitting: *Deleted

## 2023-03-24 DIAGNOSIS — M84512D Pathological fracture in neoplastic disease, left shoulder, subsequent encounter for fracture with routine healing: Secondary | ICD-10-CM | POA: Diagnosis not present

## 2023-03-24 DIAGNOSIS — D630 Anemia in neoplastic disease: Secondary | ICD-10-CM | POA: Diagnosis not present

## 2023-03-24 DIAGNOSIS — G40909 Epilepsy, unspecified, not intractable, without status epilepticus: Secondary | ICD-10-CM | POA: Diagnosis not present

## 2023-03-24 DIAGNOSIS — Z1330 Encounter for screening examination for mental health and behavioral disorders, unspecified: Secondary | ICD-10-CM | POA: Diagnosis not present

## 2023-03-24 DIAGNOSIS — S066X0D Traumatic subarachnoid hemorrhage without loss of consciousness, subsequent encounter: Secondary | ICD-10-CM | POA: Diagnosis not present

## 2023-03-24 DIAGNOSIS — C7492 Malignant neoplasm of unspecified part of left adrenal gland: Secondary | ICD-10-CM | POA: Diagnosis not present

## 2023-03-24 DIAGNOSIS — I10 Essential (primary) hypertension: Secondary | ICD-10-CM | POA: Diagnosis not present

## 2023-03-24 DIAGNOSIS — S0181XD Laceration without foreign body of other part of head, subsequent encounter: Secondary | ICD-10-CM | POA: Diagnosis not present

## 2023-03-24 DIAGNOSIS — C642 Malignant neoplasm of left kidney, except renal pelvis: Secondary | ICD-10-CM | POA: Diagnosis not present

## 2023-03-24 DIAGNOSIS — C7951 Secondary malignant neoplasm of bone: Secondary | ICD-10-CM | POA: Diagnosis not present

## 2023-03-24 DIAGNOSIS — S065X0D Traumatic subdural hemorrhage without loss of consciousness, subsequent encounter: Secondary | ICD-10-CM | POA: Diagnosis not present

## 2023-03-24 DIAGNOSIS — S065XAA Traumatic subdural hemorrhage with loss of consciousness status unknown, initial encounter: Secondary | ICD-10-CM | POA: Diagnosis not present

## 2023-03-24 NOTE — Telephone Encounter (Signed)
Received call from Norton Pastel, RN from [place] advising that patient still has accessed port from 6/23 when he was at [facility].  Offered flush appointment for tomorrow, however patient does not have transportation. He is on schedule for Chemo on Monday.   HH nurse to de access and flush with saline, as she does not have heparin available.

## 2023-03-28 NOTE — Progress Notes (Signed)
[facility] [address] [place] [address]    Clinic Day:  03/29/23   Referring physician: Assunta Found, MD  Patient Care Team: Assunta Found, MD as PCP - General (Family Medicine) Doreatha Massed, MD as Medical Oncologist (Medical Oncology) Therese Sarah, RN as Oncology Nurse Navigator (Medical Oncology)   ASSESSMENT & PLAN:   Assessment: 1.  Metastatic left kidney clear-cell RCC: - Presentation with left shoulder pain.  Weight loss of 50 pounds in the last 6 months but was trying to lose weight by cutting back on eating and doing some walking. - Left robotic assisted laparoscopic radical nephrectomy by Dr. Ronne Binning on 08/13/2022 - Pathology: 7 cm clear-cell RCC with focal sarcomatoid and rhabdoid features, nuclear grade 4.  Tumor extends into the renal vein and renal sinus fat (PT3a).  Ureteral, vascular and all margins negative for tumor. - PET scan (12/24/2022): Enlarging hypermetabolic left adrenal metastasis.  2 small hypermetabolic soft tissue nodules in the left nephrectomy bed.  Hypermetabolic lytic lesions involving distal left clavicle and left temporal bone.  Indeterminate small hypermetabolic parotid nodules bilaterally. - IMDC: At least intermediate pending labs - Lenvatinib 14 mg daily started on 01/18/2023, Keytruda on 01/22/2023, dose decreased to 10 mg on 01/28/2023 due to elevated blood pressure   2.  Social/family history: - Lives by himself and is independent of ADLs and IADLs.  He does not drive.  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  Current active smoker, half pack per day, started at age 68. - Mother might have had cancer, he is not sure.    Plan: 1.  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 - At last visit we cut back lenvatinib to 10 mg daily.  He is tolerating it very well. - Reviewed labs today: LFTs elevated AST  48 and ALT 51, both improved from last time.  Creatinine elevated at 1.28.  CBC with mild anemia.  TSH improved to 6.35 from 8.32 last time. - Recommend increasing lenvatinib to 14 mg daily.  Proceed with Keytruda today. - He will receive 500 mL normal saline over 30 minutes today due to elevated creatinine. - RTC 3 weeks for follow-up.   2.  Hypertension: - Blood pressure today is better at 130/60.  Continue Norvasc 10 mg daily.  Will closely monitor.   3. Left shoulder pain: - He has completed radiation.  He is not requiring any pain medication.    No orders of the defined types were placed in this encounter.      Alben Deeds Teague,acting as a Neurosurgeon for Doreatha Massed, MD.,have documented all relevant documentation on the behalf of Doreatha Massed, MD,as directed by  Doreatha Massed, MD while in the presence of Doreatha Massed, MD.  I, Doreatha Massed MD, have reviewed the above documentation for accuracy and completeness, and I agree with the above.    Doreatha Massed, MD   7/15/20245:12 PM  CHIEF COMPLAINT:   Diagnosis: clear cell renal cell carcinoma    Cancer Staging  Clear cell renal cell carcinoma, left (HCC) Staging form: Kidney, AJCC 8th Edition - Clinical stage from 01/12/2023: Stage IV (cT3a, cNX, cM1) - Unsigned    Prior Therapy: left nephrectomy 08/13/22   Current Therapy:  Lenvatinib and pembrolizumab    HISTORY OF PRESENT ILLNESS:   Oncology History  Clear cell renal cell carcinoma, left (HCC)  01/12/2023 Initial Diagnosis  Clear cell renal cell carcinoma, left (HCC)   01/22/2023 -  Chemotherapy   Patient is on Treatment Plan : KIDNEY Pembrolizumab (200) q21d        INTERVAL HISTORY:   David [name] is a 72 y.o. male presenting to clinic today for follow up of clear cell renal cell carcinoma. He was last seen by PA Karena Addison on 02/15/23.  Since his last visit, he was admitted to the ED on 6/23 for fall in which he hit his head. X  rays found subacute appearing, mildly displaced pathologic fracture through known lytic lesion within the distal left clavicle, and no evidence of pelvic fracture or diastasis. CT of the head found acute 7 mm subdural hematoma along the right cerebral convexity, small volume subarachnoid hemorrhage in the right frontal lobe, soft tissue laceration along the left posterior scalp, redemonstration of a left temporal bone lytic lesion, and partially imaged lytic lesion in the left clavicle, possibly with a surrounding hematoma. He was started on Keppra 500mg  2x daily for 3 days and lisinoprol 10mg  1x daily upon discharge on 6/28.   Today, he states that he is doing well overall. His appetite level is at 100%. His energy level is at 60%. He is accompanied by his wife.   He reports he went to the doctor for his left shoulder pain and received radiation. He has a scheduled appointment with Dr. Romeo Apple on 7/22. He denies taking any pain medications.   He c/o frequent stools that are watery and notes yesterday he had 4 bowel movements. He denies taking Imodium. He reports a healthy appetite. He denies nausea, vomiting, skin rashes, abdominal pain, or leg swelling.   He is taking Norvasc as prescribed.   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 Ronal Fear note   Pneumonia    Seizure [facility])     Surgical History: Past Surgical History:  Procedure Laterality Date   COLONOSCOPY  05/31/2012   Procedure: COLONOSCOPY;  Surgeon: Dalia Heading, MD;  Location: [facility];  Service: Gastroenterology;  Laterality: N/A;   PORTACATH PLACEMENT Right 01/27/2023   Procedure: INSERTION PORT-A-CATH, RIJ;  Surgeon: Lewie Chamber, DO;  Location: [facility] ORS;  Service: General;  Laterality: Right;   ROBOT ASSISTED LAPAROSCOPIC  NEPHRECTOMY Left 08/13/2022   Procedure: XI ROBOTIC ASSISTED LAPAROSCOPIC NEPHRECTOMY;  Surgeon: Malen Gauze, MD;  Location: [facility] ORS;  Service: Urology;  Laterality: Left;   TONSILLECTOMY      Social History: Social History   Socioeconomic History   Marital status: Divorced    Spouse name: Not on file   Number of children: Not on file   Years of education: Not on file   Highest education level: Not on file  Occupational History   Not on file  Tobacco Use   Smoking status: Every Day    Current packs/day: 0.50    Average packs/day: 0.5 packs/day for 65.0 years (32.5 ttl pk-yrs)    Types: Cigarettes   Smokeless tobacco: Never  Vaping Use   Vaping status: Never Used  Substance and Sexual Activity   Alcohol use: No   Drug use: No   Sexual activity: Not Currently  Other Topics Concern   Not on file  Social History Narrative   Not on file   Social  Determinants of Health   Financial Resource Strain: Low Risk  (03/24/2023)   Received from [facility]   Overall Financial Resource Strain (CARDIA)    Difficulty of Paying Living Expenses: Not hard at all  Recent Concern: Financial Resource Strain - High Risk (02/01/2023)   Overall Financial Resource Strain (CARDIA)    Difficulty of Paying Living Expenses: Very hard  Food Insecurity: No Food Insecurity (03/24/2023)   Received from [facility]   Hunger Vital Sign    Worried About Running Out of Food in the Last Year: Never true    Ran Out of Food in the Last Year: Never true  Transportation Needs: No Transportation Needs (03/24/2023)   Received from [facility] - Transportation    Lack of Transportation (Medical): No    Lack of Transportation (Non-Medical): No  Physical Activity: Not on file  Stress: Patient Declined (03/07/2023)   Received from [facility], [facility] - Occupational Stress Questionnaire    Feeling of Stress : Patient declined  Social  Connections: Unknown (03/07/2023)   Received from [facility], [facility]   Social Network    Social Network: Not on file  Intimate Partner Violence: Not At Risk (03/08/2023)   [facility]   HITS    Over the last 12 months how often did your partner physically hurt you?: 1    Over the last 12 months how often did your partner insult you or talk down to you?: 1    Over the last 12 months how often did your partner threaten you with physical harm?: 1    Over the last 12 months how often did your partner scream or curse at you?: 1    Family History: History reviewed. No pertinent family history.  Current Medications:  Current Outpatient Medications:    amLODipine (NORVASC) 10 MG tablet, Take 1 tablet (10 mg total) by mouth daily., Disp: 30 tablet, Rfl: 6   lenvatinib 14 mg daily dose (LENVIMA) 10 & 4 MG capsule, Take 14 mg by mouth daily. (Take one 10mg  capsule and one 4mg  capsule for a total dose of 14mg ), Disp: 60 capsule, Rfl: 0   levETIRAcetam (KEPPRA) 500 MG tablet, Take by mouth., Disp: , Rfl:    lidocaine-prilocaine (EMLA) cream, Apply to affected area once, Disp: 30 g, Rfl: 3   lisinopril (ZESTRIL) 10 MG tablet, Take by mouth., Disp: , Rfl:    Pembrolizumab (KEYTRUDA IV), Inject into the vein every 21 ( twenty-one) days., Disp: , Rfl:    prochlorperazine (COMPAZINE) 10 MG tablet, Take 1 tablet (10 mg total) by mouth every 6 (six) hours as needed for nausea or vomiting., Disp: 30 tablet, Rfl: 1   tamsulosin (FLOMAX) 0.4 MG CAPS capsule, Take 0.4 mg by mouth daily. , Disp: , Rfl:  No current facility-administered medications for this visit.  Facility-Administered Medications Ordered in Other Visits:    0.9 %  sodium chloride infusion, , Intravenous, Continuous, Doreatha Massed, MD, Stopped at 03/29/23 1141   sodium chloride flush (NS) 0.9 % injection 10 mL, 10 mL, Intracatheter, PRN, Doreatha Massed, MD, 10 mL at 03/29/23 1304    Allergies: No Known Allergies  REVIEW OF SYSTEMS:   Review of Systems  Constitutional:  Positive for fatigue. Negative for chills and fever.  HENT:   Negative for lump/mass, mouth sores, nosebleeds, sore throat and trouble swallowing.   Eyes:  Negative for eye problems.  Respiratory:  Negative for cough and shortness of breath.   Cardiovascular:  Negative for chest pain, leg swelling and palpitations.  Gastrointestinal:  Positive for diarrhea. Negative for abdominal pain, constipation, nausea and vomiting.  Genitourinary:  Negative for bladder incontinence, difficulty urinating, dysuria, frequency, hematuria and nocturia.   Musculoskeletal:  Negative for arthralgias, back pain, flank pain, myalgias and neck pain.  Skin:  Negative for itching and rash.  Neurological:  Negative for dizziness, headaches and numbness.  Hematological:  Does not bruise/bleed easily.  Psychiatric/Behavioral:  Positive for sleep disturbance. Negative for depression and suicidal ideas. The patient is not nervous/anxious.   All other systems reviewed and are negative.    VITALS:   Blood pressure 101/74, pulse 67, temperature (!) 97.5 F (36.4 C), temperature source Tympanic, resp. rate 20, SpO2 100%.  Wt Readings from Last 3 Encounters:  03/29/23 231 lb 9.6 oz (105.1 kg)  02/15/23 240 lb 3.2 oz (109 kg)  01/28/23 246 lb (111.6 kg)    There is no height or weight on file to calculate BMI.  Performance status (ECOG): 1 - Symptomatic but completely ambulatory  PHYSICAL EXAM:   Physical Exam Vitals and nursing note reviewed. Exam conducted with a chaperone present.  Constitutional:      Appearance: Normal appearance.  Cardiovascular:     Rate and Rhythm: Normal rate and regular rhythm.     Pulses: Normal pulses.     Heart sounds: Normal heart sounds.  Pulmonary:     Effort: Pulmonary effort is normal.     Breath sounds: Normal breath sounds.  Abdominal:     Palpations: Abdomen is soft. There is  no hepatomegaly, splenomegaly or mass.     Tenderness: There is no abdominal tenderness.  Musculoskeletal:     Right lower leg: No edema.     Left lower leg: No edema.  Lymphadenopathy:     Cervical: No cervical adenopathy.     Right cervical: No superficial, deep or posterior cervical adenopathy.    Left cervical: No superficial, deep or posterior cervical adenopathy.     Upper Body:     Right upper body: No supraclavicular or axillary adenopathy.     Left upper body: No supraclavicular or axillary adenopathy.  Neurological:     General: No focal deficit present.     Mental Status: He is alert and oriented to person, place, and time.  Psychiatric:        Mood and Affect: Mood normal.        Behavior: Behavior normal.     LABS:      Latest Ref Rng & Units 03/29/2023    9:06 AM 02/15/2023   11:43 AM 01/28/2023   10:11 AM  CBC  WBC 4.0 - 10.5 K/uL 6.7  7.9  9.6   Hemoglobin 13.0 - 17.0 g/dL 09.8  11.9  14.7   Hematocrit 39.0 - 52.0 % 31.0  33.6  35.4   Platelets 150 - 400 K/uL 195  175  206       Latest Ref Rng & Units 03/29/2023    9:06 AM 02/15/2023   11:43 AM 01/28/2023   10:11 AM  CMP  Glucose 70 - 99 mg/dL 829  562  130   BUN 8 - 23 mg/dL 23  24  22    Creatinine 0.61 - 1.24 mg/dL 8.65  7.84  6.96   Sodium 135 - 145 mmol/L 136  133  135   Potassium 3.5 - 5.1 mmol/L 4.5  4.0  4.2   Chloride 98 - 111 mmol/L 107  105  104   CO2 22 - 32 mmol/L 23  20  22    Calcium 8.9 - 10.3 mg/dL 8.7  8.4  8.8   Total Protein 6.5 - 8.1 g/dL 6.7  7.0  7.0   Total Bilirubin 0.3 - 1.2 mg/dL 0.6  0.9  1.0   Alkaline Phos 38 - 126 U/L 110  85  95   AST 15 - 41 U/L 48  83  90   ALT 0 - 44 U/L 51  86  86      No results found for: "CEA1", "CEA" / No results found for: "CEA1", "CEA" No results found for: "PSA1" No results found for: "WNU272" No results found for: "CAN125"  No results found for: "TOTALPROTELP", "ALBUMINELP", "A1GS", "A2GS", "BETS", "BETA2SER", "GAMS", "MSPIKE", "SPEI" Lab  Results  Component Value Date   TIBC 250 01/20/2023   TIBC 271 04/03/2009   FERRITIN 655 (H) 01/20/2023   FERRITIN 870 (H) 04/03/2009   IRONPCTSAT 26 01/20/2023   IRONPCTSAT 72 (H) 04/03/2009   Lab Results  Component Value Date   LDH 248 (H) 01/20/2023     STUDIES:   CT HEAD WO CONTRAST ( )  Result Date: 03/22/2023 CLINICAL DATA:  Traumatic subdural hematoma without loss of consciousness, initial encounter. Fall hitting back of head last week. EXAM: CT HEAD WITHOUT CONTRAST TECHNIQUE: Contiguous axial images were obtained from the base of the skull through the vertex without intravenous contrast. RADIATION DOSE REDUCTION: This exam was performed according to the departmental dose-optimization program which includes automated exposure control, adjustment of the mA and/or kV according to patient size and/or use of iterative reconstruction technique. COMPARISON:  Head CT 03/07/[facility] FINDINGS: Brain: The previous right frontal subdural hematoma has diminished from prior exam. There is a residual 3 mm isodense subdural collection consistent with evolving/resolving hemorrhage, for example series 4, image 26. No residual high-density or acute subdural blood. Right frontal subarachnoid hemorrhage has near completely resolved, faint residual hyperdensity/residual blood. There is slight increase in adjacent ill-defined hypodensity in the right frontal lobe which is likely evolving contusion. No associated midline shift. No new hemorrhage. No hydrocephalus. Basilar cisterns are patent. Vascular: Atherosclerosis of skullbase vasculature without hyperdense vessel or abnormal calcification. Skull: Unchanged appearance of lytic lesion involving the left temporal bone posterior to the mastoid air cells. No new calvarial abnormality. Sinuses/Orbits: Chronic mucosal thickening throughout ethmoid air cells and frontal sinuses. Other: Decreasing size of left-sided scalp hematoma, there are overlying  skin staples in place. IMPRESSION: 1. Previous right frontal subdural hematoma has diminished from prior exam. There is a residual 3 mm isodense subdural collection consistent with evolving/resolving hemorrhage. No residual high-density or acute subdural blood. 2. Right frontal subarachnoid hemorrhage has near completely resolved, faint residual hyperdensity/residual blood. There is slight increase in adjacent ill-defined hypodensity in the right frontal lobe which is likely evolving contusion. No associated midline shift. 3. Unchanged appearance of lytic lesion involving the left temporal bone posterior to the mastoid air cells consistent with osseous metastatic disease, previously assessed on PET. Electronically Signed   By: Narda Rutherford M.D.   On: 03/22/2023 15:33

## 2023-03-29 ENCOUNTER — Encounter: Payer: Self-pay | Admitting: Hematology

## 2023-03-29 ENCOUNTER — Inpatient Hospital Stay (HOSPITAL_BASED_OUTPATIENT_CLINIC_OR_DEPARTMENT_OTHER): Payer: Medicare PPO | Admitting: Hematology

## 2023-03-29 ENCOUNTER — Inpatient Hospital Stay: Payer: Medicare PPO | Attending: Hematology

## 2023-03-29 ENCOUNTER — Inpatient Hospital Stay: Payer: Medicare PPO

## 2023-03-29 VITALS — BP 137/76 | HR 65 | Temp 98.0°F | Resp 18

## 2023-03-29 VITALS — Wt 231.6 lb

## 2023-03-29 DIAGNOSIS — C7972 Secondary malignant neoplasm of left adrenal gland: Secondary | ICD-10-CM | POA: Insufficient documentation

## 2023-03-29 DIAGNOSIS — D649 Anemia, unspecified: Secondary | ICD-10-CM | POA: Insufficient documentation

## 2023-03-29 DIAGNOSIS — Z79620 Long term (current) use of immunosuppressive biologic: Secondary | ICD-10-CM | POA: Insufficient documentation

## 2023-03-29 DIAGNOSIS — C642 Malignant neoplasm of left kidney, except renal pelvis: Secondary | ICD-10-CM

## 2023-03-29 DIAGNOSIS — Z5112 Encounter for antineoplastic immunotherapy: Secondary | ICD-10-CM | POA: Diagnosis not present

## 2023-03-29 DIAGNOSIS — E860 Dehydration: Secondary | ICD-10-CM

## 2023-03-29 DIAGNOSIS — I10 Essential (primary) hypertension: Secondary | ICD-10-CM | POA: Insufficient documentation

## 2023-03-29 DIAGNOSIS — Z95828 Presence of other vascular implants and grafts: Secondary | ICD-10-CM

## 2023-03-29 LAB — CBC WITH DIFFERENTIAL/PLATELET
Abs Immature Granulocytes: 0.04 10*3/uL (ref 0.00–0.07)
Basophils Absolute: 0 10*3/uL (ref 0.0–0.1)
Basophils Relative: 0 %
Eosinophils Absolute: 0.3 10*3/uL (ref 0.0–0.5)
Eosinophils Relative: 4 %
HCT: 31 % — ABNORMAL LOW (ref 39.0–52.0)
Hemoglobin: 10.3 g/dL — ABNORMAL LOW (ref 13.0–17.0)
Immature Granulocytes: 1 %
Lymphocytes Relative: 58 %
Lymphs Abs: 3.9 10*3/uL (ref 0.7–4.0)
MCH: 34 pg (ref 26.0–34.0)
MCHC: 33.2 g/dL (ref 30.0–36.0)
MCV: 102.3 fL — ABNORMAL HIGH (ref 80.0–100.0)
Monocytes Absolute: 1.1 10*3/uL — ABNORMAL HIGH (ref 0.1–1.0)
Monocytes Relative: 16 %
Neutro Abs: 1.4 10*3/uL — ABNORMAL LOW (ref 1.7–7.7)
Neutrophils Relative %: 21 %
Platelets: 195 10*3/uL (ref 150–400)
RBC: 3.03 MIL/uL — ABNORMAL LOW (ref 4.22–5.81)
RDW: 21.2 % — ABNORMAL HIGH (ref 11.5–15.5)
WBC: 6.7 10*3/uL (ref 4.0–10.5)
nRBC: 0.6 % — ABNORMAL HIGH (ref 0.0–0.2)

## 2023-03-29 LAB — COMPREHENSIVE METABOLIC PANEL
ALT: 51 U/L — ABNORMAL HIGH (ref 0–44)
AST: 48 U/L — ABNORMAL HIGH (ref 15–41)
Albumin: 3.4 g/dL — ABNORMAL LOW (ref 3.5–5.0)
Alkaline Phosphatase: 110 U/L (ref 38–126)
Anion gap: 6 (ref 5–15)
BUN: 23 mg/dL (ref 8–23)
CO2: 23 mmol/L (ref 22–32)
Calcium: 8.7 mg/dL — ABNORMAL LOW (ref 8.9–10.3)
Chloride: 107 mmol/L (ref 98–111)
Creatinine, Ser: 1.28 mg/dL — ABNORMAL HIGH (ref 0.61–1.24)
GFR, Estimated: 60 mL/min — ABNORMAL LOW (ref >60)
Glucose, Bld: 139 mg/dL — ABNORMAL HIGH (ref 70–99)
Potassium: 4.5 mmol/L (ref 3.5–5.1)
Sodium: 136 mmol/L (ref 135–145)
Total Bilirubin: 0.6 mg/dL (ref 0.3–1.2)
Total Protein: 6.7 g/dL (ref 6.5–8.1)

## 2023-03-29 LAB — MAGNESIUM: Magnesium: 1.9 mg/dL (ref 1.7–2.4)

## 2023-03-29 LAB — TSH: TSH: 6.358 u[IU]/mL — ABNORMAL HIGH (ref 0.350–4.500)

## 2023-03-29 MED ORDER — SODIUM CHLORIDE 0.9% FLUSH
10.0000 mL | INTRAVENOUS | Status: DC | PRN
  Administered 2023-03-29: 10 mL via INTRAVENOUS

## 2023-03-29 MED ORDER — SODIUM CHLORIDE 0.9 % IV SOLN
200.0000 mg | Freq: Once | INTRAVENOUS | Status: AC
  Administered 2023-03-29: 200 mg via INTRAVENOUS
  Filled 2023-03-29: qty 8

## 2023-03-29 MED ORDER — SODIUM CHLORIDE 0.9% FLUSH
10.0000 mL | INTRAVENOUS | Status: DC | PRN
  Administered 2023-03-29: 10 mL

## 2023-03-29 MED ORDER — SODIUM CHLORIDE 0.9 % IV SOLN: INTRAVENOUS | Status: DC

## 2023-03-29 MED ORDER — SODIUM CHLORIDE 0.9 % IV SOLN: Freq: Once | INTRAVENOUS | Status: AC

## 2023-03-29 MED ORDER — HEPARIN SOD (PORK) LOCK FLUSH 100 UNIT/ML IV SOLN
500.0000 [IU] | Freq: Once | INTRAVENOUS | Status: AC | PRN
  Administered 2023-03-29: 500 [IU]

## 2023-03-29 NOTE — Progress Notes (Signed)
Ok to treat with ANC 1.4.  David [name] [place], [place], BCPS, BCOP 03/29/2023 11:28 AM

## 2023-03-29 NOTE — Patient Instructions (Signed)
[facility]  Discharge Instructions: Thank you for choosing [facility] to provide your oncology and hematology care.  If you have a lab appointment with the Cancer Center - please note that after April 8th, 2024, all labs will be drawn in the cancer center.  You do not have to check in or register with the main entrance as you have in the past but will complete your check-in in the cancer center.  Wear comfortable clothing and clothing appropriate for easy access to any Portacath or PICC line.   We strive to give you quality time with your provider. You may need to reschedule your appointment if you arrive late (15 or more minutes).  Arriving late affects you and other patients whose appointments are after yours.  Also, if you miss three or more appointments without notifying the office, you may be dismissed from the clinic at the provider's discretion.      For prescription refill requests, have your pharmacy contact our office and allow 72 hours for refills to be completed.    Today you received the following chemotherapy and/or immunotherapy agents Keytruda and normal saline bolus.  Pembrolizumab Injection What is this medication? PEMBROLIZUMAB (PEM broe LIZ ue mab) treats some types of cancer. It works by helping your immune system slow or stop the spread of cancer cells. It is a monoclonal antibody. This medicine may be used for other purposes; ask your health care provider or pharmacist if you have questions. COMMON BRAND NAME(S): Keytruda What should I tell my care team before I take this medication? They need to know if you have any of these conditions: Allogeneic stem cell transplant (uses someone else's stem cells) Autoimmune diseases, such as Crohn disease, ulcerative colitis, lupus History of chest radiation Nervous system problems, such as Guillain-Barre syndrome, myasthenia gravis Organ transplant An unusual or allergic reaction to  pembrolizumab, other medications, foods, dyes, or preservatives Pregnant or trying to get pregnant Breast-feeding How should I use this medication? This medication is injected into a vein. It is given by your care team in a hospital or clinic setting. A special MedGuide will be given to you before each treatment. Be sure to read this information carefully each time. Talk to your care team about the use of this medication in children. While it may be prescribed for children as young as 6 months for selected conditions, precautions do apply. Overdosage: If you think you have taken too much of this medicine contact a poison control center or emergency room at once. NOTE: This medicine is only for you. Do not share this medicine with others. What if I miss a dose? Keep appointments for follow-up doses. It is important not to miss your dose. Call your care team if you are unable to keep an appointment. What may interact with this medication? Interactions have not been studied. This list may not describe all possible interactions. Give your health care provider a list of all the medicines, herbs, non-prescription drugs, or dietary supplements you use. Also tell them if you smoke, drink alcohol, or use illegal drugs. Some items may interact with your medicine. What should I watch for while using this medication? Your condition will be monitored carefully while you are receiving this medication. You may need blood work while taking this medication. This medication may cause serious skin reactions. They can happen weeks to months after starting the medication. Contact your care team right away if you notice fevers or flu-like symptoms  with a rash. The rash may be red or purple and then turn into blisters or peeling of the skin. You may also notice a red rash with swelling of the face, lips, or lymph nodes in your neck or under your arms. Tell your care team right away if you have any change in your  eyesight. Talk to your care team if you may be pregnant. Serious birth defects can occur if you take this medication during pregnancy and for 4 months after the last dose. You will need a negative pregnancy test before starting this medication. Contraception is recommended while taking this medication and for 4 months after the last dose. Your care team can help you find the option that works for you. Do not breastfeed while taking this medication and for 4 months after the last dose. What side effects may I notice from receiving this medication? Side effects that you should report to your care team as soon as possible: Allergic reactions--skin rash, itching, hives, swelling of the face, lips, tongue, or throat Dry cough, shortness of breath or trouble breathing Eye pain, redness, irritation, or discharge with blurry or decreased vision Heart muscle inflammation--unusual weakness or fatigue, shortness of breath, chest pain, fast or irregular heartbeat, dizziness, swelling of the ankles, feet, or hands Hormone gland problems--headache, sensitivity to light, unusual weakness or fatigue, dizziness, fast or irregular heartbeat, increased sensitivity to cold or heat, excessive sweating, constipation, hair loss, increased thirst or amount of urine, tremors or shaking, irritability Infusion reactions--chest pain, shortness of breath or trouble breathing, feeling faint or lightheaded Kidney injury (glomerulonephritis)--decrease in the amount of urine, red or dark brown urine, foamy or bubbly urine, swelling of the ankles, hands, or feet Liver injury--right upper belly pain, loss of appetite, nausea, light-colored stool, dark yellow or brown urine, yellowing skin or eyes, unusual weakness or fatigue Pain, tingling, or numbness in the hands or feet, muscle weakness, change in vision, confusion or trouble speaking, loss of balance or coordination, trouble walking, seizures Rash, fever, and swollen lymph  nodes Redness, blistering, peeling, or loosening of the skin, including inside the mouth Sudden or severe stomach pain, bloody diarrhea, fever, nausea, vomiting Side effects that usually do not require medical attention (report to your care team if they continue or are bothersome): Bone, joint, or muscle pain Diarrhea Fatigue Loss of appetite Nausea Skin rash This list may not describe all possible side effects. Call your doctor for medical advice about side effects. You may report side effects to FDA at 1-800-FDA-1088. Where should I keep my medication? This medication is given in a hospital or clinic. It will not be stored at home. NOTE: This sheet is a summary. It may not cover all possible information. If you have questions about this medicine, talk to your doctor, pharmacist, or health care provider.  2024 Elsevier/Gold Standard (2022-01-13 00:00:00)    To help prevent nausea and vomiting after your treatment, we encourage you to take your nausea medication as directed.  BELOW ARE SYMPTOMS THAT SHOULD BE REPORTED IMMEDIATELY: *FEVER GREATER THAN 100.4 F (38 C) OR HIGHER *CHILLS OR SWEATING *NAUSEA AND VOMITING THAT IS NOT CONTROLLED WITH YOUR NAUSEA MEDICATION *UNUSUAL SHORTNESS OF BREATH *UNUSUAL BRUISING OR BLEEDING *URINARY PROBLEMS (pain or burning when urinating, or frequent urination) *BOWEL PROBLEMS (unusual diarrhea, constipation, pain near the anus) TENDERNESS IN MOUTH AND THROAT WITH OR WITHOUT PRESENCE OF ULCERS (sore throat, sores in mouth, or a toothache) UNUSUAL RASH, SWELLING OR PAIN  UNUSUAL VAGINAL DISCHARGE OR  ITCHING   Items with * indicate a potential emergency and should be followed up as soon as possible or go to the Emergency Department if any problems should occur.  Please show the CHEMOTHERAPY ALERT CARD or IMMUNOTHERAPY ALERT CARD at check-in to the Emergency Department and triage nurse.  Should you have questions after your visit or need to cancel  or reschedule your appointment, please contact [facility] [phone]  and follow the prompts.  Office hours are 8:00 a.m. to 4:30 p.m. Monday - Friday. Please note that voicemails left after 4:00 p.m. may not be returned until the following business day.  We are closed weekends and major holidays. You have access to a nurse at all times for urgent questions. Please call the main number to the clinic [phone] and follow the prompts.  For any non-urgent questions, you may also contact your provider using MyChart. We now offer e-Visits for anyone 104 and older to request care online for non-urgent symptoms. For details visit [email or web address].   Also download the MyChart app! Go to the app store, search "MyChart", open the app, select [place], and log in with your MyChart username and password.

## 2023-03-29 NOTE — Patient Instructions (Addendum)
[facility] at [facility] Discharge Instructions   You were seen and examined today by Dr. Ellin Saba.  He reviewed the results of your lab work which are normal/stable.   We will proceed with your treatment today.   Dr. Kirtland Bouchard would like you to increase the Lenvima to 14 mg daily.   Return as scheduled.    Thank you for choosing [facility] at [facility] to provide your oncology and hematology care.  To afford each patient quality time with our provider, please arrive at least 15 minutes before your scheduled appointment time.   If you have a lab appointment with the Cancer Center please come in thru the Main Entrance and check in at the main information desk.  You need to re-schedule your appointment should you arrive 10 or more minutes late.  We strive to give you quality time with our providers, and arriving late affects you and other patients whose appointments are after yours.  Also, if you no show three or more times for appointments you may be dismissed from the clinic at the providers discretion.     Again, thank you for choosing [facility].  Our hope is that these requests will decrease the amount of time that you wait before being seen by our physicians.       _____________________________________________________________  Should you have questions after your visit to [facility], please contact our office at [phone] and follow the prompts.  Our office hours are 8:00 a.m. and 4:30 p.m. Monday - Friday.  Please note that voicemails left after 4:00 p.m. may not be returned until the following business day.  We are closed weekends and major holidays.  You do have access to a nurse 24-7, just call the main number to the clinic [phone] and do not press any options, hold on the line and a nurse will answer the phone.    For prescription refill requests, have your pharmacy contact our office and allow 72  hours.    Due to Covid, you will need to wear a mask upon entering the hospital. If you do not have a mask, a mask will be given to you at the Main Entrance upon arrival. For doctor visits, patients may have 1 support person age 85 or older with them. For treatment visits, patients can not have anyone with them due to social distancing guidelines and our immunocompromised population.

## 2023-03-29 NOTE — Progress Notes (Signed)
Patient presents today for chemotherapy infusion/immunotherapy of Keytruda. Patient is in satisfactory condition with no new complaints voiced.  Vital signs are stable.  Labs reviewed by Dr. Ellin Saba during the office visit and all labs are within treatment parameters with exception of NAC 1.4 and TSH 6.358. Approval from Dr Ellin Saba given to give treatment today with addition of bolus of Normal Saline.  We will proceed with treatment per MD orders.   Patient tolerated treatment well with no complaints voiced.  Patient left ambulatory in stable condition.  Vital signs stable at discharge.  Follow up as scheduled.

## 2023-03-29 NOTE — Progress Notes (Signed)
Patient is taking Lenvima as prescribed.  He has not missed any doses and reports no side effects at this time.    Patient has been examined by Dr. Ellin Saba. Vital signs and labs have been reviewed by MD - ANC (1.4), Creatinine, LFTs, hemoglobin, and platelets are within treatment parameters per M.D. - pt may proceed with treatment.  Primary RN and pharmacy notified.

## 2023-03-29 NOTE — Progress Notes (Signed)
Patients port flushed without difficulty.  Good blood return noted with no bruising or swelling noted at site.  Patient remains accessed for treatment.  

## 2023-03-30 DIAGNOSIS — S065X0D Traumatic subdural hemorrhage without loss of consciousness, subsequent encounter: Secondary | ICD-10-CM | POA: Diagnosis not present

## 2023-03-30 DIAGNOSIS — S066X0D Traumatic subarachnoid hemorrhage without loss of consciousness, subsequent encounter: Secondary | ICD-10-CM | POA: Diagnosis not present

## 2023-03-30 DIAGNOSIS — C642 Malignant neoplasm of left kidney, except renal pelvis: Secondary | ICD-10-CM | POA: Diagnosis not present

## 2023-03-30 DIAGNOSIS — M84512D Pathological fracture in neoplastic disease, left shoulder, subsequent encounter for fracture with routine healing: Secondary | ICD-10-CM | POA: Diagnosis not present

## 2023-03-30 DIAGNOSIS — S0181XD Laceration without foreign body of other part of head, subsequent encounter: Secondary | ICD-10-CM | POA: Diagnosis not present

## 2023-03-30 DIAGNOSIS — G40909 Epilepsy, unspecified, not intractable, without status epilepticus: Secondary | ICD-10-CM | POA: Diagnosis not present

## 2023-03-30 DIAGNOSIS — C7951 Secondary malignant neoplasm of bone: Secondary | ICD-10-CM | POA: Diagnosis not present

## 2023-03-30 DIAGNOSIS — C7492 Malignant neoplasm of unspecified part of left adrenal gland: Secondary | ICD-10-CM | POA: Diagnosis not present

## 2023-03-30 DIAGNOSIS — D630 Anemia in neoplastic disease: Secondary | ICD-10-CM | POA: Diagnosis not present

## 2023-03-30 LAB — T4: T4, Total: 4.7 ug/dL (ref 4.5–12.0)

## 2023-03-31 DIAGNOSIS — C7492 Malignant neoplasm of unspecified part of left adrenal gland: Secondary | ICD-10-CM | POA: Diagnosis not present

## 2023-03-31 DIAGNOSIS — S065X0D Traumatic subdural hemorrhage without loss of consciousness, subsequent encounter: Secondary | ICD-10-CM | POA: Diagnosis not present

## 2023-03-31 DIAGNOSIS — C7951 Secondary malignant neoplasm of bone: Secondary | ICD-10-CM | POA: Diagnosis not present

## 2023-03-31 DIAGNOSIS — C642 Malignant neoplasm of left kidney, except renal pelvis: Secondary | ICD-10-CM | POA: Diagnosis not present

## 2023-03-31 DIAGNOSIS — S0181XD Laceration without foreign body of other part of head, subsequent encounter: Secondary | ICD-10-CM | POA: Diagnosis not present

## 2023-03-31 DIAGNOSIS — M84512D Pathological fracture in neoplastic disease, left shoulder, subsequent encounter for fracture with routine healing: Secondary | ICD-10-CM | POA: Diagnosis not present

## 2023-03-31 DIAGNOSIS — S066X0D Traumatic subarachnoid hemorrhage without loss of consciousness, subsequent encounter: Secondary | ICD-10-CM | POA: Diagnosis not present

## 2023-03-31 DIAGNOSIS — D630 Anemia in neoplastic disease: Secondary | ICD-10-CM | POA: Diagnosis not present

## 2023-03-31 DIAGNOSIS — G40909 Epilepsy, unspecified, not intractable, without status epilepticus: Secondary | ICD-10-CM | POA: Diagnosis not present

## 2023-04-01 DIAGNOSIS — D630 Anemia in neoplastic disease: Secondary | ICD-10-CM | POA: Diagnosis not present

## 2023-04-01 DIAGNOSIS — S065X0D Traumatic subdural hemorrhage without loss of consciousness, subsequent encounter: Secondary | ICD-10-CM | POA: Diagnosis not present

## 2023-04-01 DIAGNOSIS — M84512D Pathological fracture in neoplastic disease, left shoulder, subsequent encounter for fracture with routine healing: Secondary | ICD-10-CM | POA: Diagnosis not present

## 2023-04-01 DIAGNOSIS — S0181XD Laceration without foreign body of other part of head, subsequent encounter: Secondary | ICD-10-CM | POA: Diagnosis not present

## 2023-04-01 DIAGNOSIS — C642 Malignant neoplasm of left kidney, except renal pelvis: Secondary | ICD-10-CM | POA: Diagnosis not present

## 2023-04-01 DIAGNOSIS — C7951 Secondary malignant neoplasm of bone: Secondary | ICD-10-CM | POA: Diagnosis not present

## 2023-04-01 DIAGNOSIS — C7492 Malignant neoplasm of unspecified part of left adrenal gland: Secondary | ICD-10-CM | POA: Diagnosis not present

## 2023-04-01 DIAGNOSIS — G40909 Epilepsy, unspecified, not intractable, without status epilepticus: Secondary | ICD-10-CM | POA: Diagnosis not present

## 2023-04-01 DIAGNOSIS — S066X0D Traumatic subarachnoid hemorrhage without loss of consciousness, subsequent encounter: Secondary | ICD-10-CM | POA: Diagnosis not present

## 2023-04-05 ENCOUNTER — Ambulatory Visit: Payer: Medicare PPO | Admitting: Orthopedic Surgery

## 2023-04-05 ENCOUNTER — Other Ambulatory Visit: Payer: Self-pay

## 2023-04-05 VITALS — BP 113/71 | HR 94 | Ht 71.5 in | Wt 230.0 lb

## 2023-04-05 DIAGNOSIS — M25512 Pain in left shoulder: Secondary | ICD-10-CM

## 2023-04-05 DIAGNOSIS — S42025A Nondisplaced fracture of shaft of left clavicle, initial encounter for closed fracture: Secondary | ICD-10-CM

## 2023-04-05 NOTE — Progress Notes (Signed)
Chief Complaint  Patient presents with   Shoulder Injury    DOI 03/07/23 fell getting off church Zenaida Niece missed last step   Patient: David [name]           Date of Birth: 05-30-1951           MRN: [id number] Visit Date: 04/05/2023 Requested by: Assunta Found, MD [address] 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  He has regained full range of motion and says he does not have any pain    Body mass index is 31.63 kg/m.   Problem list, medical hx, medications and allergies reviewed   Review of Systems  Constitutional:  Negative for fever.  Respiratory:  Negative for shortness of breath.   Cardiovascular:  Negative for chest pain.  Skin: Negative.   Neurological:  Negative for tingling and sensory change.     No Known Allergies  BP 113/71   Pulse 94   Ht 5' 11.5" (1.816 m)   Wt 230 lb (104.3 kg)   BMI 31.63 kg/m    Physical exam: Physical Exam  General: normal appearance   CDV:  normal pulses   MS: alert   PSYCH: normal mood pleasant   NEURO: normal sensation left arm   MSK:  Prominence is noted over the left clavicle.  But it is nontender.  He exhibited full active range of motion no weakness  Data reviewed:   Images of the left clavicle from an outside source it looks like at [facility] shows a clavicle fracture from June 2024 which is already started healing there appears to have been  Fracture  Today's x-ray healing frx prob pathologic   Assessment and plan:  Encounter Diagnoses  Name Primary?   Acute pain of left shoulder Yes   Closed nondisplaced fracture of shaft of left clavicle, initial encounter     Left  shoulder clavicle fracture already healing patient has regained full range of motion and activities follow-up as needed next   No orders of the defined types were placed in this encounter.   Procedures:   No

## 2023-04-08 DIAGNOSIS — S065X0D Traumatic subdural hemorrhage without loss of consciousness, subsequent encounter: Secondary | ICD-10-CM | POA: Diagnosis not present

## 2023-04-08 DIAGNOSIS — D630 Anemia in neoplastic disease: Secondary | ICD-10-CM | POA: Diagnosis not present

## 2023-04-08 DIAGNOSIS — S066X0D Traumatic subarachnoid hemorrhage without loss of consciousness, subsequent encounter: Secondary | ICD-10-CM | POA: Diagnosis not present

## 2023-04-08 DIAGNOSIS — C7492 Malignant neoplasm of unspecified part of left adrenal gland: Secondary | ICD-10-CM | POA: Diagnosis not present

## 2023-04-08 DIAGNOSIS — C642 Malignant neoplasm of left kidney, except renal pelvis: Secondary | ICD-10-CM | POA: Diagnosis not present

## 2023-04-08 DIAGNOSIS — C7951 Secondary malignant neoplasm of bone: Secondary | ICD-10-CM | POA: Diagnosis not present

## 2023-04-08 DIAGNOSIS — M84512D Pathological fracture in neoplastic disease, left shoulder, subsequent encounter for fracture with routine healing: Secondary | ICD-10-CM | POA: Diagnosis not present

## 2023-04-08 DIAGNOSIS — G40909 Epilepsy, unspecified, not intractable, without status epilepticus: Secondary | ICD-10-CM | POA: Diagnosis not present

## 2023-04-08 DIAGNOSIS — S0181XD Laceration without foreign body of other part of head, subsequent encounter: Secondary | ICD-10-CM | POA: Diagnosis not present

## 2023-04-12 DIAGNOSIS — C7951 Secondary malignant neoplasm of bone: Secondary | ICD-10-CM | POA: Diagnosis not present

## 2023-04-12 DIAGNOSIS — S066X0D Traumatic subarachnoid hemorrhage without loss of consciousness, subsequent encounter: Secondary | ICD-10-CM | POA: Diagnosis not present

## 2023-04-12 DIAGNOSIS — C642 Malignant neoplasm of left kidney, except renal pelvis: Secondary | ICD-10-CM | POA: Diagnosis not present

## 2023-04-12 DIAGNOSIS — S065X0D Traumatic subdural hemorrhage without loss of consciousness, subsequent encounter: Secondary | ICD-10-CM | POA: Diagnosis not present

## 2023-04-12 DIAGNOSIS — C7492 Malignant neoplasm of unspecified part of left adrenal gland: Secondary | ICD-10-CM | POA: Diagnosis not present

## 2023-04-12 DIAGNOSIS — D630 Anemia in neoplastic disease: Secondary | ICD-10-CM | POA: Diagnosis not present

## 2023-04-12 DIAGNOSIS — M84512D Pathological fracture in neoplastic disease, left shoulder, subsequent encounter for fracture with routine healing: Secondary | ICD-10-CM | POA: Diagnosis not present

## 2023-04-12 DIAGNOSIS — S0181XD Laceration without foreign body of other part of head, subsequent encounter: Secondary | ICD-10-CM | POA: Diagnosis not present

## 2023-04-12 DIAGNOSIS — G40909 Epilepsy, unspecified, not intractable, without status epilepticus: Secondary | ICD-10-CM | POA: Diagnosis not present

## 2023-04-14 ENCOUNTER — Other Ambulatory Visit: Payer: Self-pay

## 2023-04-14 DIAGNOSIS — S0181XD Laceration without foreign body of other part of head, subsequent encounter: Secondary | ICD-10-CM | POA: Diagnosis not present

## 2023-04-14 DIAGNOSIS — S065X0D Traumatic subdural hemorrhage without loss of consciousness, subsequent encounter: Secondary | ICD-10-CM | POA: Diagnosis not present

## 2023-04-14 DIAGNOSIS — S066X0D Traumatic subarachnoid hemorrhage without loss of consciousness, subsequent encounter: Secondary | ICD-10-CM | POA: Diagnosis not present

## 2023-04-14 DIAGNOSIS — C642 Malignant neoplasm of left kidney, except renal pelvis: Secondary | ICD-10-CM | POA: Diagnosis not present

## 2023-04-14 DIAGNOSIS — D630 Anemia in neoplastic disease: Secondary | ICD-10-CM | POA: Diagnosis not present

## 2023-04-14 DIAGNOSIS — G40909 Epilepsy, unspecified, not intractable, without status epilepticus: Secondary | ICD-10-CM | POA: Diagnosis not present

## 2023-04-14 DIAGNOSIS — M84512D Pathological fracture in neoplastic disease, left shoulder, subsequent encounter for fracture with routine healing: Secondary | ICD-10-CM | POA: Diagnosis not present

## 2023-04-14 DIAGNOSIS — C7951 Secondary malignant neoplasm of bone: Secondary | ICD-10-CM | POA: Diagnosis not present

## 2023-04-14 DIAGNOSIS — C7492 Malignant neoplasm of unspecified part of left adrenal gland: Secondary | ICD-10-CM | POA: Diagnosis not present

## 2023-04-15 ENCOUNTER — Other Ambulatory Visit: Payer: Self-pay

## 2023-04-19 DIAGNOSIS — S0181XD Laceration without foreign body of other part of head, subsequent encounter: Secondary | ICD-10-CM | POA: Diagnosis not present

## 2023-04-19 DIAGNOSIS — S065X0D Traumatic subdural hemorrhage without loss of consciousness, subsequent encounter: Secondary | ICD-10-CM | POA: Diagnosis not present

## 2023-04-19 DIAGNOSIS — C7492 Malignant neoplasm of unspecified part of left adrenal gland: Secondary | ICD-10-CM | POA: Diagnosis not present

## 2023-04-19 DIAGNOSIS — M84512D Pathological fracture in neoplastic disease, left shoulder, subsequent encounter for fracture with routine healing: Secondary | ICD-10-CM | POA: Diagnosis not present

## 2023-04-19 DIAGNOSIS — G40909 Epilepsy, unspecified, not intractable, without status epilepticus: Secondary | ICD-10-CM | POA: Diagnosis not present

## 2023-04-19 DIAGNOSIS — C642 Malignant neoplasm of left kidney, except renal pelvis: Secondary | ICD-10-CM | POA: Diagnosis not present

## 2023-04-19 DIAGNOSIS — S066X0D Traumatic subarachnoid hemorrhage without loss of consciousness, subsequent encounter: Secondary | ICD-10-CM | POA: Diagnosis not present

## 2023-04-19 DIAGNOSIS — D630 Anemia in neoplastic disease: Secondary | ICD-10-CM | POA: Diagnosis not present

## 2023-04-19 DIAGNOSIS — C7951 Secondary malignant neoplasm of bone: Secondary | ICD-10-CM | POA: Diagnosis not present

## 2023-04-19 NOTE — Progress Notes (Signed)
[facility] [address], [place] [address]    Clinic Day:  04/20/23   Referring physician: Assunta Found, MD  Patient Care Team: Assunta Found, MD as PCP - General (Family Medicine) Doreatha Massed, MD as Medical Oncologist (Medical Oncology) Therese Sarah, RN as Oncology Nurse Navigator (Medical Oncology)   ASSESSMENT & PLAN:   Assessment: 1.  Metastatic left kidney clear-cell RCC: - Presentation with left shoulder pain.  Weight loss of 50 pounds in the last 6 months but was trying to lose weight by cutting back on eating and doing some walking. - Left robotic assisted laparoscopic radical nephrectomy by Dr. Ronne Binning on 08/13/2022 - Pathology: 7 cm clear-cell RCC with focal sarcomatoid and rhabdoid features, nuclear grade 4.  Tumor extends into the renal vein and renal sinus fat (PT3a).  Ureteral, vascular and all margins negative for tumor. - PET scan (12/24/2022): Enlarging hypermetabolic left adrenal metastasis.  2 small hypermetabolic soft tissue nodules in the left nephrectomy bed.  Hypermetabolic lytic lesions involving distal left clavicle and left temporal bone.  Indeterminate small hypermetabolic parotid nodules bilaterally. - IMDC: At least intermediate pending labs - Lenvatinib 14 mg daily started on 01/18/2023, Keytruda on 01/22/2023, dose decreased to 10 mg on 01/28/2023 due to elevated blood pressure   2.  Social/family history: - Lives by himself and is independent of ADLs and IADLs.  He does not drive.  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  Current active smoker, half pack per day, started at age 21. - Mother might have had cancer, he is not sure.    Plan: 1.  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  He has tolerated it very well in the last 3 weeks. 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 - Reviewed labs today:  Normal creatinine.  LFTs were elevated with AST at 48 and ALT at 45.  CBC was grossly normal.  Last TSH is 6.35. - Continue lenvatinib 14 mg daily.  Proceed with cycle 4 of Keytruda today.  No immunotherapy related side effects. - Recommend follow-up in 3 months with repeat PET scan for response evaluation.   2.  Hypertension: - Continue Norvasc 10 mg daily.  Blood pressure today is 123/74.   3. Left shoulder pain: - He has completed radiation.  He is not requiring any pain medication.    Orders Placed This Encounter  Procedures   NM PET Image Restag (PS) Skull Base To Thigh    Standing Status:   Future    Standing Expiration Date:   04/19/2024    Order Specific Question:   If indicated for the ordered procedure, I authorize the administration of a radiopharmaceutical per Radiology protocol    Answer:   Yes    Order Specific Question:   Preferred imaging location?    Answer:   [place]       I,Helena R Teague,acting as a Neurosurgeon for Doreatha Massed, MD.,have documented all relevant documentation on the behalf of Doreatha Massed, MD,as directed by  Doreatha Massed, MD while in the presence of Doreatha Massed, MD.  I, Doreatha Massed MD, have reviewed the above documentation for accuracy and completeness, and I agree with the above.     Doreatha Massed, MD   8/6/20245:47 PM  CHIEF COMPLAINT:   Diagnosis: clear cell renal cell carcinoma    Cancer Staging  Clear cell renal cell carcinoma, left (HCC) Staging form: Kidney, AJCC 8th Edition - Clinical stage from 01/12/2023: Stage IV (cT3a, cNX, cM1) - Unsigned    Prior Therapy: left nephrectomy 08/13/22   Current Therapy:  Lenvatinib and pembrolizumab    HISTORY OF PRESENT ILLNESS:   Oncology History  Clear cell renal cell carcinoma, left (HCC)  01/12/2023 Initial Diagnosis   Clear cell renal cell carcinoma, left (HCC)   01/22/2023 -  Chemotherapy   Patient is on Treatment Plan : KIDNEY  Pembrolizumab (200) q21d        INTERVAL HISTORY:   David [name] is a 72 y.o. male presenting to clinic today for follow up of clear cell renal cell carcinoma. He was last seen by me on 03/29/23.  Today, he states that he is doing well overall. His appetite level is at 100%. His energy level is at 70%.    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 Ronal Fear note   Pneumonia    Seizure [facility])     Surgical History: Past Surgical History:  Procedure Laterality Date   COLONOSCOPY  05/31/2012   Procedure: COLONOSCOPY;  Surgeon: Dalia Heading, MD;  Location: [facility];  Service: Gastroenterology;  Laterality: N/A;   PORTACATH PLACEMENT Right 01/27/2023   Procedure: INSERTION PORT-A-CATH, RIJ;  Surgeon: Lewie Chamber, DO;  Location: [facility] ORS;  Service: General;  Laterality: Right;   ROBOT ASSISTED LAPAROSCOPIC NEPHRECTOMY Left 08/13/2022   Procedure: XI ROBOTIC ASSISTED LAPAROSCOPIC NEPHRECTOMY;  Surgeon: Malen Gauze, MD;  Location: [facility] ORS;  Service: Urology;  Laterality: Left;   TONSILLECTOMY      Social History: Social History   Socioeconomic History   Marital status: Divorced    Spouse name: Not on file   Number of children: Not on file   Years of education: Not on file   Highest education level: Not on file  Occupational History   Not on file  Tobacco Use   Smoking status: Every Day    Current packs/day: 0.50    Average packs/day: 0.5 packs/day for 65.0 years (32.5 ttl pk-yrs)    Types: Cigarettes   Smokeless tobacco: Never  Vaping Use   Vaping status: Never Used  Substance and Sexual Activity   Alcohol use: No   Drug use: No   Sexual activity: Not Currently  Other Topics Concern   Not on file  Social History Narrative   Not on file   Social Determinants of Health    Financial Resource Strain: Low Risk  (03/24/2023)   Received from [facility]   Overall Financial Resource Strain (CARDIA)    Difficulty of Paying Living Expenses: Not hard at all  Recent Concern: Financial Resource Strain - High Risk (02/01/2023)   Overall Financial Resource Strain (CARDIA)    Difficulty of Paying Living Expenses: Very hard  Food Insecurity: No Food Insecurity (03/24/2023)   Received from [facility]   Hunger Vital Sign    Worried About Running Out of Food in the Last Year: Never true    Ran Out of Food in the Last Year: Never true  Transportation Needs: No Transportation Needs (03/24/2023)   Received from [facility] - Transportation    Lack of Transportation (Medical): No    Lack  of Transportation (Non-Medical): No  Physical Activity: Not on file  Stress: Patient Declined (03/07/2023)   Received from [facility], [facility] - Occupational Stress Questionnaire    Feeling of Stress : Patient declined  Social Connections: Unknown (03/07/2023)   Received from [facility], [facility]   Social Network    Social Network: Not on file  Intimate Partner Violence: Not At Risk (03/08/2023)   Received from [facility], [facility]   HITS    Over the last 12 months how often did your partner physically hurt you?: 1    Over the last 12 months how often did your partner insult you or talk down to you?: 1    Over the last 12 months how often did your partner threaten you with physical harm?: 1    Over the last 12 months how often did your partner scream or curse at you?: 1    Family History: No family history on file.  Current Medications:  Current Outpatient Medications:    amLODipine (NORVASC) 10 MG tablet, Take 1 tablet (10 mg total) by mouth daily., Disp: 30 tablet, Rfl: 6   levETIRAcetam (KEPPRA) 500 MG tablet, Take by mouth., Disp: , Rfl:    lidocaine-prilocaine (EMLA) cream, Apply to affected  area once, Disp: 30 g, Rfl: 3   lisinopril (ZESTRIL) 10 MG tablet, Take by mouth., Disp: , Rfl:    Pembrolizumab (KEYTRUDA IV), Inject into the vein every 21 ( twenty-one) days., Disp: , Rfl:    prochlorperazine (COMPAZINE) 10 MG tablet, Take 1 tablet (10 mg total) by mouth every 6 (six) hours as needed for nausea or vomiting., Disp: 30 tablet, Rfl: 1   tamsulosin (FLOMAX) 0.4 MG CAPS capsule, Take 0.4 mg by mouth daily. , Disp: , Rfl:    lenvatinib 14 mg daily dose (LENVIMA) 10 & 4 MG capsule, Take 14 mg by mouth daily. (Take one 10mg  capsule and one 4mg  capsule for a total dose of 14mg ), Disp: 60 capsule, Rfl: 0 No current facility-administered medications for this visit.  Facility-Administered Medications Ordered in Other Visits:    sodium chloride flush (NS) 0.9 % injection 10 mL, 10 mL, Intracatheter, PRN, Doreatha Massed, MD, 10 mL at 04/20/23 1242   Allergies: No Known Allergies  REVIEW OF SYSTEMS:   Review of Systems  Constitutional:  Negative for chills, fatigue and fever.  HENT:   Negative for lump/mass, mouth sores, nosebleeds, sore throat and trouble swallowing.   Eyes:  Negative for eye problems.  Respiratory:  Negative for cough and shortness of breath.   Cardiovascular:  Negative for chest pain, leg swelling and palpitations.  Gastrointestinal:  Positive for diarrhea. Negative for abdominal pain, constipation, nausea and vomiting.  Genitourinary:  Negative for bladder incontinence, difficulty urinating, dysuria, frequency, hematuria and nocturia.   Musculoskeletal:  Negative for arthralgias, back pain, flank pain, myalgias and neck pain.  Skin:  Negative for itching and rash.  Neurological:  Negative for dizziness, headaches and numbness.  Hematological:  Does not bruise/bleed easily.  Psychiatric/Behavioral:  Positive for sleep disturbance. Negative for depression and suicidal ideas. The patient is not nervous/anxious.   All other systems reviewed and are negative.     VITALS:   There were no vitals taken for this visit.  Wt Readings from Last 3 Encounters:  04/20/23 232 lb (105.2 kg)  04/05/23 230 lb (104.3 kg)  03/29/23 231 lb 9.6 oz (105.1 kg)    There is no  height or weight on file to calculate BMI.  Performance status (ECOG): 1 - Symptomatic but completely ambulatory  PHYSICAL EXAM:   Physical Exam Vitals and nursing note reviewed. Exam conducted with a chaperone present.  Constitutional:      Appearance: Normal appearance.  Cardiovascular:     Rate and Rhythm: Normal rate and regular rhythm.     Pulses: Normal pulses.     Heart sounds: Normal heart sounds.  Pulmonary:     Effort: Pulmonary effort is normal.     Breath sounds: Normal breath sounds.  Abdominal:     Palpations: Abdomen is soft. There is no hepatomegaly, splenomegaly or mass.     Tenderness: There is no abdominal tenderness.  Musculoskeletal:     Right lower leg: No edema.     Left lower leg: No edema.  Lymphadenopathy:     Cervical: No cervical adenopathy.     Right cervical: No superficial, deep or posterior cervical adenopathy.    Left cervical: No superficial, deep or posterior cervical adenopathy.     Upper Body:     Right upper body: No supraclavicular or axillary adenopathy.     Left upper body: No supraclavicular or axillary adenopathy.  Neurological:     General: No focal deficit present.     Mental Status: He is alert and oriented to person, place, and time.  Psychiatric:        Mood and Affect: Mood normal.        Behavior: Behavior normal.     LABS:      Latest Ref Rng & Units 04/20/2023    9:50 AM 03/29/2023    9:06 AM 02/15/2023   11:43 AM  CBC  WBC 4.0 - 10.5 K/uL 8.0  6.7  7.9   Hemoglobin 13.0 - 17.0 g/dL 47.8  29.5  62.1   Hematocrit 39.0 - 52.0 % 31.2  31.0  33.6   Platelets 150 - 400 K/uL 195  195  175       Latest Ref Rng & Units 04/20/2023    9:50 AM 03/29/2023    9:06 AM 02/15/2023   11:43 AM  CMP  Glucose 70 - 99 mg/dL 308  657   846   BUN 8 - 23 mg/dL 27  23  24    Creatinine 0.61 - 1.24 mg/dL 9.62  9.52  8.41   Sodium 135 - 145 mmol/L 132  136  133   Potassium 3.5 - 5.1 mmol/L 3.9  4.5  4.0   Chloride 98 - 111 mmol/L 106  107  105   CO2 22 - 32 mmol/L 17  23  20    Calcium 8.9 - 10.3 mg/dL 8.4  8.7  8.4   Total Protein 6.5 - 8.1 g/dL 6.6  6.7  7.0   Total Bilirubin 0.3 - 1.2 mg/dL 0.7  0.6  0.9   Alkaline Phos 38 - 126 U/L 122  110  85   AST 15 - 41 U/L 48  48  83   ALT 0 - 44 U/L 45  51  86      No results found for: "CEA1", "CEA" / No results found for: "CEA1", "CEA" No results found for: "PSA1" No results found for: "LKG401" No results found for: "CAN125"  No results found for: "TOTALPROTELP", "ALBUMINELP", "A1GS", "A2GS", "BETS", "BETA2SER", "GAMS", "MSPIKE", "SPEI" Lab Results  Component Value Date   TIBC 250 01/20/2023   TIBC 271 04/03/2009   FERRITIN 655 (H) 01/20/2023  FERRITIN 870 (H) 04/03/2009   IRONPCTSAT 26 01/20/2023   IRONPCTSAT 72 (H) 04/03/2009   Lab Results  Component Value Date   LDH 248 (H) 01/20/2023     STUDIES:   DG Clavicle Left  Result Date: 04/05/2023 [facility] Report Dictated by Dr. Romeo Apple Chief complaint  frx clavicle Images: 2 ap lat clavicle Overlapping distal 1/3 clav frx with large callous around it Ipr: healing frx left clavicle   CT HEAD WO CONTRAST ( )  Result Date: 03/22/2023 CLINICAL DATA:  Traumatic subdural hematoma without loss of consciousness, initial encounter. Fall hitting back of head last week. EXAM: CT HEAD WITHOUT CONTRAST TECHNIQUE: Contiguous axial images were obtained from the base of the skull through the vertex without intravenous contrast. RADIATION DOSE REDUCTION: This exam was performed according to the departmental dose-optimization program which includes automated exposure control, adjustment of the mA and/or kV according to patient size and/or use of iterative reconstruction technique. COMPARISON:  Head CT  03/07/2023 at [facility] FINDINGS: Brain: The previous right frontal subdural hematoma has diminished from prior exam. There is a residual 3 mm isodense subdural collection consistent with evolving/resolving hemorrhage, for example series 4, image 26. No residual high-density or acute subdural blood. Right frontal subarachnoid hemorrhage has near completely resolved, faint residual hyperdensity/residual blood. There is slight increase in adjacent ill-defined hypodensity in the right frontal lobe which is likely evolving contusion. No associated midline shift. No new hemorrhage. No hydrocephalus. Basilar cisterns are patent. Vascular: Atherosclerosis of skullbase vasculature without hyperdense vessel or abnormal calcification. Skull: Unchanged appearance of lytic lesion involving the left temporal bone posterior to the mastoid air cells. No new calvarial abnormality. Sinuses/Orbits: Chronic mucosal thickening throughout ethmoid air cells and frontal sinuses. Other: Decreasing size of left-sided scalp hematoma, there are overlying skin staples in place. IMPRESSION: 1. Previous right frontal subdural hematoma has diminished from prior exam. There is a residual 3 mm isodense subdural collection consistent with evolving/resolving hemorrhage. No residual high-density or acute subdural blood. 2. Right frontal subarachnoid hemorrhage has near completely resolved, faint residual hyperdensity/residual blood. There is slight increase in adjacent ill-defined hypodensity in the right frontal lobe which is likely evolving contusion. No associated midline shift. 3. Unchanged appearance of lytic lesion involving the left temporal bone posterior to the mastoid air cells consistent with osseous metastatic disease, previously assessed on PET. Electronically Signed   By: Narda Rutherford M.D.   On: 03/22/2023 15:33

## 2023-04-20 ENCOUNTER — Other Ambulatory Visit: Payer: Self-pay | Admitting: Hematology

## 2023-04-20 ENCOUNTER — Other Ambulatory Visit (HOSPITAL_COMMUNITY): Payer: Self-pay

## 2023-04-20 ENCOUNTER — Inpatient Hospital Stay: Payer: Medicare PPO | Attending: Hematology

## 2023-04-20 ENCOUNTER — Other Ambulatory Visit: Payer: Self-pay

## 2023-04-20 ENCOUNTER — Inpatient Hospital Stay (HOSPITAL_BASED_OUTPATIENT_CLINIC_OR_DEPARTMENT_OTHER): Payer: Medicare PPO | Admitting: Hematology

## 2023-04-20 ENCOUNTER — Inpatient Hospital Stay: Payer: Medicare PPO

## 2023-04-20 VITALS — BP 131/83 | HR 62 | Temp 98.2°F | Resp 16

## 2023-04-20 DIAGNOSIS — C642 Malignant neoplasm of left kidney, except renal pelvis: Secondary | ICD-10-CM | POA: Insufficient documentation

## 2023-04-20 DIAGNOSIS — N189 Chronic kidney disease, unspecified: Secondary | ICD-10-CM | POA: Diagnosis not present

## 2023-04-20 DIAGNOSIS — C7972 Secondary malignant neoplasm of left adrenal gland: Secondary | ICD-10-CM | POA: Insufficient documentation

## 2023-04-20 DIAGNOSIS — M25512 Pain in left shoulder: Secondary | ICD-10-CM | POA: Diagnosis not present

## 2023-04-20 DIAGNOSIS — R21 Rash and other nonspecific skin eruption: Secondary | ICD-10-CM | POA: Insufficient documentation

## 2023-04-20 DIAGNOSIS — I129 Hypertensive chronic kidney disease with stage 1 through stage 4 chronic kidney disease, or unspecified chronic kidney disease: Secondary | ICD-10-CM | POA: Diagnosis not present

## 2023-04-20 DIAGNOSIS — Z5112 Encounter for antineoplastic immunotherapy: Secondary | ICD-10-CM | POA: Diagnosis not present

## 2023-04-20 DIAGNOSIS — C7951 Secondary malignant neoplasm of bone: Secondary | ICD-10-CM | POA: Diagnosis not present

## 2023-04-20 LAB — CBC WITH DIFFERENTIAL/PLATELET
Abs Immature Granulocytes: 0.05 10*3/uL (ref 0.00–0.07)
Basophils Absolute: 0 10*3/uL (ref 0.0–0.1)
Basophils Relative: 0 %
Eosinophils Absolute: 0.4 10*3/uL (ref 0.0–0.5)
Eosinophils Relative: 5 %
HCT: 31.2 % — ABNORMAL LOW (ref 39.0–52.0)
Hemoglobin: 10.4 g/dL — ABNORMAL LOW (ref 13.0–17.0)
Immature Granulocytes: 1 %
Lymphocytes Relative: 61 %
Lymphs Abs: 4.8 10*3/uL — ABNORMAL HIGH (ref 0.7–4.0)
MCH: 34.8 pg — ABNORMAL HIGH (ref 26.0–34.0)
MCHC: 33.3 g/dL (ref 30.0–36.0)
MCV: 104.3 fL — ABNORMAL HIGH (ref 80.0–100.0)
Monocytes Absolute: 1.4 10*3/uL — ABNORMAL HIGH (ref 0.1–1.0)
Monocytes Relative: 18 %
Neutro Abs: 1.2 10*3/uL — ABNORMAL LOW (ref 1.7–7.7)
Neutrophils Relative %: 15 %
Platelets: 195 10*3/uL (ref 150–400)
RBC: 2.99 MIL/uL — ABNORMAL LOW (ref 4.22–5.81)
RDW: 21.2 % — ABNORMAL HIGH (ref 11.5–15.5)
WBC: 8 10*3/uL (ref 4.0–10.5)
nRBC: 0.4 % — ABNORMAL HIGH (ref 0.0–0.2)

## 2023-04-20 LAB — COMPREHENSIVE METABOLIC PANEL
ALT: 45 U/L — ABNORMAL HIGH (ref 0–44)
AST: 48 U/L — ABNORMAL HIGH (ref 15–41)
Albumin: 3.5 g/dL (ref 3.5–5.0)
Alkaline Phosphatase: 122 U/L (ref 38–126)
Anion gap: 9 (ref 5–15)
BUN: 27 mg/dL — ABNORMAL HIGH (ref 8–23)
CO2: 17 mmol/L — ABNORMAL LOW (ref 22–32)
Calcium: 8.4 mg/dL — ABNORMAL LOW (ref 8.9–10.3)
Chloride: 106 mmol/L (ref 98–111)
Creatinine, Ser: 1.07 mg/dL (ref 0.61–1.24)
GFR, Estimated: >60 mL/min (ref >60)
Glucose, Bld: 112 mg/dL — ABNORMAL HIGH (ref 70–99)
Potassium: 3.9 mmol/L (ref 3.5–5.1)
Sodium: 132 mmol/L — ABNORMAL LOW (ref 135–145)
Total Bilirubin: 0.7 mg/dL (ref 0.3–1.2)
Total Protein: 6.6 g/dL (ref 6.5–8.1)

## 2023-04-20 LAB — MAGNESIUM: Magnesium: 1.9 mg/dL (ref 1.7–2.4)

## 2023-04-20 MED ORDER — SODIUM CHLORIDE 0.9 % IV SOLN: Freq: Once | INTRAVENOUS | Status: AC

## 2023-04-20 MED ORDER — SODIUM CHLORIDE 0.9% FLUSH
10.0000 mL | INTRAVENOUS | Status: DC | PRN
  Administered 2023-04-20: 10 mL

## 2023-04-20 MED ORDER — LENVATINIB (14 MG DAILY DOSE) 10 & 4 MG PO CPPK
14.0000 mg | ORAL_CAPSULE | Freq: Every day | ORAL | 0 refills | Status: DC
  Filled 2023-04-20: qty 60, 30d supply, fill #0

## 2023-04-20 MED ORDER — SODIUM CHLORIDE 0.9% FLUSH
10.0000 mL | Freq: Once | INTRAVENOUS | Status: AC
  Administered 2023-04-20: 10 mL via INTRAVENOUS

## 2023-04-20 MED ORDER — HEPARIN SOD (PORK) LOCK FLUSH 100 UNIT/ML IV SOLN
500.0000 [IU] | Freq: Once | INTRAVENOUS | Status: AC | PRN
  Administered 2023-04-20: 500 [IU]

## 2023-04-20 MED ORDER — SODIUM CHLORIDE 0.9 % IV SOLN
200.0000 mg | Freq: Once | INTRAVENOUS | Status: AC
  Administered 2023-04-20: 200 mg via INTRAVENOUS
  Filled 2023-04-20: qty 8

## 2023-04-20 NOTE — Progress Notes (Signed)
Patients port flushed without difficulty.  Good blood return noted with no bruising or swelling noted at site.  Stable during access and blood draw.  Patient to remain accessed for treatment. 

## 2023-04-20 NOTE — Progress Notes (Signed)
Patient is taking lenvatinib as prescribed. He has not missed any doses and reports no side effects at this time.    Patient has been examined by Dr. Ellin Saba. Vital signs and labs have been reviewed by MD - ANC, Creatinine, LFTs, hemoglobin, and platelets are within treatment parameters per M.D. - pt may proceed with treatment.  Primary RN and pharmacy notified.

## 2023-04-20 NOTE — Patient Instructions (Signed)
[facility]  Discharge Instructions: Thank you for choosing [facility] to provide your oncology and hematology care.  If you have a lab appointment with the Cancer Center - please note that after April 8th, 2024, all labs will be drawn in the cancer center.  You do not have to check in or register with the main entrance as you have in the past but will complete your check-in in the cancer center.  Wear comfortable clothing and clothing appropriate for easy access to any Portacath or PICC line.   We strive to give you quality time with your provider. You may need to reschedule your appointment if you arrive late (15 or more minutes).  Arriving late affects you and other patients whose appointments are after yours.  Also, if you miss three or more appointments without notifying the office, you may be dismissed from the clinic at the provider's discretion.      For prescription refill requests, have your pharmacy contact our office and allow 72 hours for refills to be completed.    Today you received the following chemotherapy and/or immunotherapy agents Keytruda and normal saline bolus.  Pembrolizumab Injection What is this medication? PEMBROLIZUMAB (PEM broe LIZ ue mab) treats some types of cancer. It works by helping your immune system slow or stop the spread of cancer cells. It is a monoclonal antibody. This medicine may be used for other purposes; ask your health care provider or pharmacist if you have questions. COMMON BRAND NAME(S): Keytruda What should I tell my care team before I take this medication? They need to know if you have any of these conditions: Allogeneic stem cell transplant (uses someone else's stem cells) Autoimmune diseases, such as Crohn disease, ulcerative colitis, lupus History of chest radiation Nervous system problems, such as Guillain-Barre syndrome, myasthenia gravis Organ transplant An unusual or allergic reaction to  pembrolizumab, other medications, foods, dyes, or preservatives Pregnant or trying to get pregnant Breast-feeding How should I use this medication? This medication is injected into a vein. It is given by your care team in a hospital or clinic setting. A special MedGuide will be given to you before each treatment. Be sure to read this information carefully each time. Talk to your care team about the use of this medication in children. While it may be prescribed for children as young as 6 months for selected conditions, precautions do apply. Overdosage: If you think you have taken too much of this medicine contact a poison control center or emergency room at once. NOTE: This medicine is only for you. Do not share this medicine with others. What if I miss a dose? Keep appointments for follow-up doses. It is important not to miss your dose. Call your care team if you are unable to keep an appointment. What may interact with this medication? Interactions have not been studied. This list may not describe all possible interactions. Give your health care provider a list of all the medicines, herbs, non-prescription drugs, or dietary supplements you use. Also tell them if you smoke, drink alcohol, or use illegal drugs. Some items may interact with your medicine. What should I watch for while using this medication? Your condition will be monitored carefully while you are receiving this medication. You may need blood work while taking this medication. This medication may cause serious skin reactions. They can happen weeks to months after starting the medication. Contact your care team right away if you notice fevers or flu-like symptoms  with a rash. The rash may be red or purple and then turn into blisters or peeling of the skin. You may also notice a red rash with swelling of the face, lips, or lymph nodes in your neck or under your arms. Tell your care team right away if you have any change in your  eyesight. Talk to your care team if you may be pregnant. Serious birth defects can occur if you take this medication during pregnancy and for 4 months after the last dose. You will need a negative pregnancy test before starting this medication. Contraception is recommended while taking this medication and for 4 months after the last dose. Your care team can help you find the option that works for you. Do not breastfeed while taking this medication and for 4 months after the last dose. What side effects may I notice from receiving this medication? Side effects that you should report to your care team as soon as possible: Allergic reactions--skin rash, itching, hives, swelling of the face, lips, tongue, or throat Dry cough, shortness of breath or trouble breathing Eye pain, redness, irritation, or discharge with blurry or decreased vision Heart muscle inflammation--unusual weakness or fatigue, shortness of breath, chest pain, fast or irregular heartbeat, dizziness, swelling of the ankles, feet, or hands Hormone gland problems--headache, sensitivity to light, unusual weakness or fatigue, dizziness, fast or irregular heartbeat, increased sensitivity to cold or heat, excessive sweating, constipation, hair loss, increased thirst or amount of urine, tremors or shaking, irritability Infusion reactions--chest pain, shortness of breath or trouble breathing, feeling faint or lightheaded Kidney injury (glomerulonephritis)--decrease in the amount of urine, red or dark brown urine, foamy or bubbly urine, swelling of the ankles, hands, or feet Liver injury--right upper belly pain, loss of appetite, nausea, light-colored stool, dark yellow or brown urine, yellowing skin or eyes, unusual weakness or fatigue Pain, tingling, or numbness in the hands or feet, muscle weakness, change in vision, confusion or trouble speaking, loss of balance or coordination, trouble walking, seizures Rash, fever, and swollen lymph  nodes Redness, blistering, peeling, or loosening of the skin, including inside the mouth Sudden or severe stomach pain, bloody diarrhea, fever, nausea, vomiting Side effects that usually do not require medical attention (report to your care team if they continue or are bothersome): Bone, joint, or muscle pain Diarrhea Fatigue Loss of appetite Nausea Skin rash This list may not describe all possible side effects. Call your doctor for medical advice about side effects. You may report side effects to FDA at 1-800-FDA-1088. Where should I keep my medication? This medication is given in a hospital or clinic. It will not be stored at home. NOTE: This sheet is a summary. It may not cover all possible information. If you have questions about this medicine, talk to your doctor, pharmacist, or health care provider.  2024 Elsevier/Gold Standard (2022-01-13 00:00:00)    To help prevent nausea and vomiting after your treatment, we encourage you to take your nausea medication as directed.  BELOW ARE SYMPTOMS THAT SHOULD BE REPORTED IMMEDIATELY: *FEVER GREATER THAN 100.4 F (38 C) OR HIGHER *CHILLS OR SWEATING *NAUSEA AND VOMITING THAT IS NOT CONTROLLED WITH YOUR NAUSEA MEDICATION *UNUSUAL SHORTNESS OF BREATH *UNUSUAL BRUISING OR BLEEDING *URINARY PROBLEMS (pain or burning when urinating, or frequent urination) *BOWEL PROBLEMS (unusual diarrhea, constipation, pain near the anus) TENDERNESS IN MOUTH AND THROAT WITH OR WITHOUT PRESENCE OF ULCERS (sore throat, sores in mouth, or a toothache) UNUSUAL RASH, SWELLING OR PAIN  UNUSUAL VAGINAL DISCHARGE OR  ITCHING   Items with * indicate a potential emergency and should be followed up as soon as possible or go to the Emergency Department if any problems should occur.  Please show the CHEMOTHERAPY ALERT CARD or IMMUNOTHERAPY ALERT CARD at check-in to the Emergency Department and triage nurse.  Should you have questions after your visit or need to cancel  or reschedule your appointment, please contact [facility] [phone]  and follow the prompts.  Office hours are 8:00 a.m. to 4:30 p.m. Monday - Friday. Please note that voicemails left after 4:00 p.m. may not be returned until the following business day.  We are closed weekends and major holidays. You have access to a nurse at all times for urgent questions. Please call the main number to the clinic [phone] and follow the prompts.  For any non-urgent questions, you may also contact your provider using MyChart. We now offer e-Visits for anyone 104 and older to request care online for non-urgent symptoms. For details visit [email or web address].   Also download the MyChart app! Go to the app store, search "MyChart", open the app, select [place], and log in with your MyChart username and password.

## 2023-04-20 NOTE — Patient Instructions (Signed)

## 2023-04-21 ENCOUNTER — Other Ambulatory Visit: Payer: Self-pay

## 2023-04-21 ENCOUNTER — Other Ambulatory Visit (HOSPITAL_COMMUNITY): Payer: Self-pay

## 2023-04-27 DIAGNOSIS — S066X0D Traumatic subarachnoid hemorrhage without loss of consciousness, subsequent encounter: Secondary | ICD-10-CM | POA: Diagnosis not present

## 2023-04-27 DIAGNOSIS — S0181XD Laceration without foreign body of other part of head, subsequent encounter: Secondary | ICD-10-CM | POA: Diagnosis not present

## 2023-04-27 DIAGNOSIS — D630 Anemia in neoplastic disease: Secondary | ICD-10-CM | POA: Diagnosis not present

## 2023-04-27 DIAGNOSIS — C642 Malignant neoplasm of left kidney, except renal pelvis: Secondary | ICD-10-CM | POA: Diagnosis not present

## 2023-04-27 DIAGNOSIS — G40909 Epilepsy, unspecified, not intractable, without status epilepticus: Secondary | ICD-10-CM | POA: Diagnosis not present

## 2023-04-27 DIAGNOSIS — C7951 Secondary malignant neoplasm of bone: Secondary | ICD-10-CM | POA: Diagnosis not present

## 2023-04-27 DIAGNOSIS — S065X0D Traumatic subdural hemorrhage without loss of consciousness, subsequent encounter: Secondary | ICD-10-CM | POA: Diagnosis not present

## 2023-04-27 DIAGNOSIS — M84512D Pathological fracture in neoplastic disease, left shoulder, subsequent encounter for fracture with routine healing: Secondary | ICD-10-CM | POA: Diagnosis not present

## 2023-04-27 DIAGNOSIS — C7492 Malignant neoplasm of unspecified part of left adrenal gland: Secondary | ICD-10-CM | POA: Diagnosis not present

## 2023-04-28 ENCOUNTER — Other Ambulatory Visit: Payer: Self-pay

## 2023-05-04 ENCOUNTER — Telehealth: Payer: Self-pay

## 2023-05-04 ENCOUNTER — Telehealth: Payer: Self-pay | Admitting: *Deleted

## 2023-05-04 ENCOUNTER — Other Ambulatory Visit: Payer: Self-pay | Admitting: *Deleted

## 2023-05-04 MED ORDER — LIDOCAINE 5 % EX OINT: 1.0000 | TOPICAL_OINTMENT | Freq: Two times a day (BID) | CUTANEOUS | 0 refills | Status: DC | PRN

## 2023-05-04 MED ORDER — CLOBETASOL PROPIONATE E 0.05 % EX CREA: 1.0000 | TOPICAL_CREAM | Freq: Two times a day (BID) | CUTANEOUS | 0 refills | Status: DC

## 2023-05-04 NOTE — Telephone Encounter (Signed)
Medication is approved through 09/14/2023.

## 2023-05-04 NOTE — Telephone Encounter (Signed)
Notified patient of prior authorization approval for Lidocaine 5% Ointment. Medication is approved through 05/03/2024. No other needs or concerns voiced at this time.

## 2023-05-04 NOTE — Telephone Encounter (Signed)
Received call from sister ,Dewain Penning stating that patient has developed peeling of hands, feet, legs and back.  Per Dr. Ellin Saba, advised to hold lenvatinib. Sent in lidocaine 5% ointment to apply twice daily for pain. Also call and clobetasol propionate 0.05% ointment twice daily. Has follow up next week and will reassess. Talked to patient and he verbalized understanding of the above.

## 2023-05-06 ENCOUNTER — Encounter (HOSPITAL_COMMUNITY)
Admission: RE | Admit: 2023-05-06 | Discharge: 2023-05-06 | Disposition: A | Discharge status: Home / Self Care | Payer: Medicare PPO | Source: Ambulatory Visit | Attending: Hematology | Admitting: Hematology

## 2023-05-06 DIAGNOSIS — C642 Malignant neoplasm of left kidney, except renal pelvis: Secondary | ICD-10-CM | POA: Diagnosis not present

## 2023-05-06 DIAGNOSIS — C7951 Secondary malignant neoplasm of bone: Secondary | ICD-10-CM | POA: Diagnosis not present

## 2023-05-06 MED ORDER — FLUDEOXYGLUCOSE F - 18 (FDG) INJECTION
12.1700 | Freq: Once | INTRAVENOUS | Status: AC | PRN
  Administered 2023-05-06: 12.17 via INTRAVENOUS

## 2023-05-10 ENCOUNTER — Inpatient Hospital Stay: Payer: Medicare PPO

## 2023-05-10 ENCOUNTER — Inpatient Hospital Stay: Payer: Medicare PPO | Admitting: Hematology

## 2023-05-10 ENCOUNTER — Other Ambulatory Visit: Payer: Self-pay

## 2023-05-10 VITALS — BP 98/72 | HR 93 | Temp 95.4°F | Resp 18

## 2023-05-10 DIAGNOSIS — R21 Rash and other nonspecific skin eruption: Secondary | ICD-10-CM | POA: Diagnosis not present

## 2023-05-10 DIAGNOSIS — C7972 Secondary malignant neoplasm of left adrenal gland: Secondary | ICD-10-CM | POA: Diagnosis not present

## 2023-05-10 DIAGNOSIS — C642 Malignant neoplasm of left kidney, except renal pelvis: Secondary | ICD-10-CM | POA: Diagnosis not present

## 2023-05-10 DIAGNOSIS — C7951 Secondary malignant neoplasm of bone: Secondary | ICD-10-CM | POA: Diagnosis not present

## 2023-05-10 DIAGNOSIS — Z5112 Encounter for antineoplastic immunotherapy: Secondary | ICD-10-CM | POA: Diagnosis not present

## 2023-05-10 DIAGNOSIS — I129 Hypertensive chronic kidney disease with stage 1 through stage 4 chronic kidney disease, or unspecified chronic kidney disease: Secondary | ICD-10-CM | POA: Diagnosis not present

## 2023-05-10 DIAGNOSIS — N189 Chronic kidney disease, unspecified: Secondary | ICD-10-CM | POA: Diagnosis not present

## 2023-05-10 DIAGNOSIS — M25512 Pain in left shoulder: Secondary | ICD-10-CM | POA: Diagnosis not present

## 2023-05-10 LAB — COMPREHENSIVE METABOLIC PANEL
ALT: 35 U/L (ref 0–44)
AST: 34 U/L (ref 15–41)
Albumin: 3.1 g/dL — ABNORMAL LOW (ref 3.5–5.0)
Alkaline Phosphatase: 67 U/L (ref 38–126)
Anion gap: 10 (ref 5–15)
BUN: 35 mg/dL — ABNORMAL HIGH (ref 8–23)
CO2: 18 mmol/L — ABNORMAL LOW (ref 22–32)
Calcium: 8.1 mg/dL — ABNORMAL LOW (ref 8.9–10.3)
Chloride: 113 mmol/L — ABNORMAL HIGH (ref 98–111)
Creatinine, Ser: 1.51 mg/dL — ABNORMAL HIGH (ref 0.61–1.24)
GFR, Estimated: 49 mL/min — ABNORMAL LOW (ref >60)
Glucose, Bld: 108 mg/dL — ABNORMAL HIGH (ref 70–99)
Potassium: 4 mmol/L (ref 3.5–5.1)
Sodium: 141 mmol/L (ref 135–145)
Total Bilirubin: 0.9 mg/dL (ref 0.3–1.2)
Total Protein: 6.2 g/dL — ABNORMAL LOW (ref 6.5–8.1)

## 2023-05-10 LAB — CBC WITH DIFFERENTIAL/PLATELET
Abs Immature Granulocytes: 0.12 10*3/uL — ABNORMAL HIGH (ref 0.00–0.07)
Basophils Absolute: 0 10*3/uL (ref 0.0–0.1)
Basophils Relative: 0 %
Eosinophils Absolute: 1.3 10*3/uL — ABNORMAL HIGH (ref 0.0–0.5)
Eosinophils Relative: 17 %
HCT: 27.7 % — ABNORMAL LOW (ref 39.0–52.0)
Hemoglobin: 9.1 g/dL — ABNORMAL LOW (ref 13.0–17.0)
Immature Granulocytes: 2 %
Lymphocytes Relative: 36 %
Lymphs Abs: 2.9 10*3/uL (ref 0.7–4.0)
MCH: 34.5 pg — ABNORMAL HIGH (ref 26.0–34.0)
MCHC: 32.9 g/dL (ref 30.0–36.0)
MCV: 104.9 fL — ABNORMAL HIGH (ref 80.0–100.0)
Monocytes Absolute: 1.3 10*3/uL — ABNORMAL HIGH (ref 0.1–1.0)
Monocytes Relative: 17 %
Neutro Abs: 2.1 10*3/uL (ref 1.7–7.7)
Neutrophils Relative %: 28 %
Platelets: 237 10*3/uL (ref 150–400)
RBC: 2.64 MIL/uL — ABNORMAL LOW (ref 4.22–5.81)
RDW: 22.5 % — ABNORMAL HIGH (ref 11.5–15.5)
WBC: 7.7 10*3/uL (ref 4.0–10.5)
nRBC: 0.9 % — ABNORMAL HIGH (ref 0.0–0.2)

## 2023-05-10 LAB — MAGNESIUM: Magnesium: 2.3 mg/dL (ref 1.7–2.4)

## 2023-05-10 MED ORDER — SODIUM CHLORIDE 0.9 % IV SOLN: Freq: Once | INTRAVENOUS | Status: AC

## 2023-05-10 MED ORDER — HEPARIN SOD (PORK) LOCK FLUSH 100 UNIT/ML IV SOLN
500.0000 [IU] | Freq: Once | INTRAVENOUS | Status: AC | PRN
  Administered 2023-05-10: 500 [IU]

## 2023-05-10 MED ORDER — SODIUM CHLORIDE 0.9% FLUSH
10.0000 mL | Freq: Once | INTRAVENOUS | Status: AC | PRN
  Administered 2023-05-10: 10 mL

## 2023-05-10 MED ORDER — SODIUM CHLORIDE 0.9 % IV SOLN
1000.0000 mg | Freq: Once | INTRAVENOUS | Status: AC
  Administered 2023-05-10: 1000 mg via INTRAVENOUS
  Filled 2023-05-10: qty 1000

## 2023-05-10 NOTE — Progress Notes (Signed)
Patient presents today for Keytruda infusion per providers order.  Vital signs and labs reviewed by MD.  Message received from Kennith Gain RN/Dr. Ellin Saba, no treatment today, patient to receive 1 liter of Normal saline over 1.5 hours and one gram of Solumedrol IV. Stable duiinfusion without adverse affects.  Vital signs stable.  No complaints at this time.  Discharge from clinic ambulatory in stable condition.  Alert and oriented X 3.  Follow up with [facility] as scheduled.

## 2023-05-10 NOTE — Patient Instructions (Signed)
[facility]  Discharge Instructions  You were seen and examined today by Dr. Ellin Saba.  Dr. Ellin Saba discussed your most recent lab work which revealed that your kidney function is elevated.   Dr. Ellin Saba wants you to hold the lisinopril and amlodipine.  Follow-up as scheduled.    Thank you for choosing [name] to provide your oncology and hematology care.   To afford each patient quality time with our provider, please arrive at least 15 minutes before your scheduled appointment time. You may need to reschedule your appointment if you arrive late (10 or more minutes). Arriving late affects you and other patients whose appointments are after yours.  Also, if you miss three or more appointments without notifying the office, you may be dismissed from the clinic at the provider's discretion.    Again, thank you for choosing [facility].  Our hope is that these requests will decrease the amount of time that you wait before being seen by our physicians.   If you have a lab appointment with the Cancer Center - please note that after April 8th, all labs will be drawn in the cancer center.  You do not have to check in or register with the main entrance as you have in the past but will complete your check-in at the cancer center.            _____________________________________________________________  Should you have questions after your visit to [facility], please contact our office at [phone] and follow the prompts.  Our office hours are 8:00 a.m. to 4:30 p.m. Monday - Thursday and 8:00 a.m. to 2:30 p.m. Friday.  Please note that voicemails left after 4:00 p.m. may not be returned until the following business day.  We are closed weekends and all major holidays.  You do have access to a nurse 24-7, just call the main number to the clinic [phone] and do not press any options, hold on the line and a  nurse will answer the phone.    For prescription refill requests, have your pharmacy contact our office and allow 72 hours.    Masks are no longer required in the cancer centers. If you would like for your care team to wear a mask while they are taking care of you, please let them know. You may have one support person who is at least 71 years old accompany you for your appointments.

## 2023-05-10 NOTE — Progress Notes (Signed)
[facility] [address] [place] [address]    Clinic Day:  05/10/2023  Referring physician: Assunta Found, MD  Patient Care Team: Assunta Found, MD as PCP - General (Family Medicine) Doreatha Massed, MD as Medical Oncologist (Medical Oncology) Therese Sarah, RN as Oncology Nurse Navigator (Medical Oncology)   ASSESSMENT & PLAN:   Assessment: 1.  Metastatic left kidney clear-cell RCC: - Presentation with left shoulder pain.  Weight loss of 50 pounds in the last 6 months but was trying to lose weight by cutting back on eating and doing some walking. - Left robotic assisted laparoscopic radical nephrectomy by Dr. Ronne Binning on 08/13/2022 - Pathology: 7 cm clear-cell RCC with focal sarcomatoid and rhabdoid features, nuclear grade 4.  Tumor extends into the renal vein and renal sinus fat (PT3a).  Ureteral, vascular and all margins negative for tumor. - PET scan (12/24/2022): Enlarging hypermetabolic left adrenal metastasis.  2 small hypermetabolic soft tissue nodules in the left nephrectomy bed.  Hypermetabolic lytic lesions involving distal left clavicle and left temporal bone.  Indeterminate small hypermetabolic parotid nodules bilaterally. - IMDC: At least intermediate pending labs - Lenvatinib 14 mg daily started on 01/18/2023, Keytruda on 01/22/2023, dose decreased to 10 mg on 01/28/2023 due to elevated blood pressure   2.  Social/family history: - Lives by himself and is independent of ADLs and IADLs.  He does not drive.  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  Current active smoker, half pack per day, started at age 30. - Mother might have had cancer, he is not sure.    Plan: 1.  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 - Last Keytruda on 04/20/2023. - He has developed diffuse rash with desquamation initially on the legs, spread to the  chest, arms and buttock region with itching and occasional burning sensation. - He called [place] on 05/04/2023 and we have told him to stop the lenvatinib. - Today's exam showed desquamation on the upper trunk, back, legs, upper extremities.  No bullous lesions were seen.  No clear signs of infection.  No lesions in the palms, eyes or mouth. - He feels weak and mostly in the bed.  His sister from [facility] is accompanying him today. - Reviewed labs today: Creatinine elevated at 1.51, previously 1.07.  Albumin is 3.1, previously 3.5.  CBC was grossly normal with mild anemia. - PET scan from 05/06/2023 is pending. - He has diffuse rash.  Will hold Keytruda.  Will give him 1 L of fluid with normal saline.  Will give him methylprednisone 1 g IV daily for 3 days.  We will reevaluate him on Wednesday.  He was told to go to the ER should he develop any fever more than 100.5.  He was told to call us if there is any sign of infection in the skin lesions.   2.  Hypertension: - Blood pressure today is 98/72. - Recommend holding lisinopril and amlodipine until next visit.   3. Left shoulder pain: - He is not requiring any pain medication since he completed radiation.    No orders of the defined types were placed in this encounter.     I,Katie Daubenspeck,acting as a Neurosurgeon for Doreatha Massed, MD.,have documented all relevant documentation on the behalf of Doreatha Massed, MD,as directed by  Doreatha Massed, MD while in the presence of Doreatha Massed, MD.  I, Doreatha Massed MD, have reviewed the above documentation for accuracy and completeness, and I agree with the above.   Doreatha Massed, MD   8/26/20244:27 PM  CHIEF COMPLAINT:   Diagnosis: metastatic clear cell renal cell carcinoma    Cancer Staging  Clear cell renal cell carcinoma, left (HCC) Staging form: Kidney, AJCC 8th Edition - Clinical stage from 01/12/2023: Stage IV (cT3a, cNX, cM1) - Unsigned    Prior  Therapy: left nephrectomy 08/13/22   Current Therapy:  Lenvatinib and pembrolizumab    HISTORY OF PRESENT ILLNESS:   Oncology History  Clear cell renal cell carcinoma, left (HCC)  01/12/2023 Initial Diagnosis   Clear cell renal cell carcinoma, left (HCC)   01/22/2023 -  Chemotherapy   Patient is on Treatment Plan : KIDNEY Pembrolizumab (200) q21d        INTERVAL HISTORY:   Jaekwon is a 72 y.o. male presenting to clinic today for follow up of metastatic clear cell renal cell carcinoma. He was last seen by me on 04/20/23.  Since his last visit, he underwent restaging PET scan on 05/06/23.   Today, he states that he is doing well overall. His appetite level is at 80%. His energy level is at 10%.  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 Ronal Fear note   Pneumonia    Seizure [facility])     Surgical History: Past Surgical History:  Procedure Laterality Date   COLONOSCOPY  05/31/2012   Procedure: COLONOSCOPY;  Surgeon: Dalia Heading, MD;  Location: [facility] END[facility] SUITE;  Service: Gastroenterology;  Laterality: N/A;   PORTACATH PLACEMENT Right 01/27/2023   Procedure: INSERTION PORT-A-CATH, RIJ;  Surgeon: Lewie Chamber, DO;  Location: [facility] ORS;  Service: General;  Laterality: Right;   ROBOT ASSISTED LAPAROSCOPIC NEPHRECTOMY Left 08/13/2022   Procedure: XI ROBOTIC ASSISTED LAPAROSCOPIC NEPHRECTOMY;  Surgeon: Malen Gauze, MD;  Location: [facility] ORS;  Service: Urology;  Laterality: Left;   TONSILLECTOMY      Social History: Social History   Socioeconomic History   Marital status: Divorced    Spouse name: Not on file   Number of children: Not on file   Years of education: Not on file   Highest education level: Not on file  Occupational History   Not on file  Tobacco Use   Smoking status: Every  Day    Current packs/day: 0.50    Average packs/day: 0.5 packs/day for 65.0 years (32.5 ttl pk-yrs)    Types: Cigarettes   Smokeless tobacco: Never  Vaping Use   Vaping status: Never Used  Substance and Sexual Activity   Alcohol use: No   Drug use: No   Sexual activity: Not Currently  Other Topics Concern   Not on file  Social History Narrative   Not on file   Social Determinants of Health   Financial Resource Strain: Low Risk  (03/24/2023)   Received from [facility]   Overall Financial Resource Strain (CARDIA)    Difficulty of Paying Living Expenses: Not hard at all  Recent Concern: Financial Resource Strain - High Risk (02/01/2023)   Overall Financial Resource Strain (CARDIA)    Difficulty of Paying Living Expenses: Very hard  Food Insecurity: No Food Insecurity (03/24/2023)   Received from [facility]   Hunger Vital Sign  Worried About Programme researcher, broadcasting/film/video in the Last Year: Never true    Ran Out of Food in the Last Year: Never true  Transportation Needs: No Transportation Needs (03/24/2023)   Received from [facility] - Transportation    Lack of Transportation (Medical): No    Lack of Transportation (Non-Medical): No  Physical Activity: Not on file  Stress: Patient Declined (03/07/2023)   Received from [facility], [facility] - Occupational Stress Questionnaire    Feeling of Stress : Patient declined  Social Connections: Unknown (03/07/2023)   Received from [facility], [facility]   Social Network    Social Network: Not on file  Intimate Partner Violence: Not At Risk (03/08/2023)   Received from [facility], [facility]   HITS    Over the last 12 months how often did your partner physically hurt you?: 1    Over the last 12 months how often did your partner insult you or talk down to you?: 1    Over the last 12 months how often did your partner threaten you with physical harm?: 1    Over the last  12 months how often did your partner scream or curse at you?: 1    Family History: No family history on file.  Current Medications:  Current Outpatient Medications:    amLODipine (NORVASC) 10 MG tablet, Take 1 tablet (10 mg total) by mouth daily., Disp: 30 tablet, Rfl: 6   Clobetasol Prop Emollient Base (CLOBETASOL PROPIONATE E) 0.05 % emollient cream, Apply 1 Application topically 2 (two) times daily., Disp: 30 g, Rfl: 0   lenvatinib 14 mg daily dose (LENVIMA) 10 & 4 MG capsule, Take 14 mg by mouth daily. (Take one 10mg  capsule and one 4mg  capsule for a total dose of 14mg ), Disp: 60 capsule, Rfl: 0   levETIRAcetam (KEPPRA) 500 MG tablet, Take by mouth., Disp: , Rfl:    lidocaine (XYLOCAINE) 5 % ointment, Apply 1 Application topically 2 (two) times daily as needed for moderate pain., Disp: 35.44 g, Rfl: 0   lisinopril (ZESTRIL) 10 MG tablet, Take by mouth., Disp: , Rfl:    Pembrolizumab (KEYTRUDA IV), Inject into the vein every 21 ( twenty-one) days., Disp: , Rfl:    prochlorperazine (COMPAZINE) 10 MG tablet, Take 1 tablet (10 mg total) by mouth every 6 (six) hours as needed for nausea or vomiting., Disp: 30 tablet, Rfl: 1   tamsulosin (FLOMAX) 0.4 MG CAPS capsule, Take 0.4 mg by mouth daily. , Disp: , Rfl:    lidocaine-prilocaine (EMLA) cream, Apply to affected area once, Disp: 30 g, Rfl: 3 No current facility-administered medications for this visit.  Facility-Administered Medications Ordered in Other Visits:    heparin lock flush 100 unit/mL, 500 Units, Intracatheter, Once PRN, Doreatha Massed, MD   sodium chloride flush (NS) 0.9 % injection 10 mL, 10 mL, Intracatheter, Once PRN, Doreatha Massed, MD   Allergies: No Known Allergies  REVIEW OF SYSTEMS:   Review of Systems  Constitutional:  Negative for chills, fatigue and fever.  HENT:   Negative for lump/mass, mouth sores, nosebleeds, sore throat and trouble swallowing.   Eyes:  Negative for eye problems.  Respiratory:   Positive for cough. Negative for shortness of breath.   Cardiovascular:  Negative for chest pain, leg swelling and palpitations.  Gastrointestinal:  Negative for abdominal pain, constipation, diarrhea, nausea and vomiting.  Genitourinary:  Negative for bladder incontinence, difficulty urinating, dysuria, frequency,  hematuria and nocturia.   Musculoskeletal:  Negative for arthralgias, back pain, flank pain, myalgias and neck pain.  Skin:  Positive for itching and rash.  Neurological:  Negative for dizziness, headaches and numbness.  Hematological:  Does not bruise/bleed easily.  Psychiatric/Behavioral:  Negative for depression, sleep disturbance and suicidal ideas. The patient is not nervous/anxious.   All other systems reviewed and are negative.    VITALS:   Blood pressure 98/72, pulse 93, temperature (!) 95.4 F (35.2 C), temperature source Tympanic, resp. rate 18, SpO2 100%.  Wt Readings from Last 3 Encounters:  04/20/23 232 lb (105.2 kg)  04/05/23 230 lb (104.3 kg)  03/29/23 231 lb 9.6 oz (105.1 kg)    There is no height or weight on file to calculate BMI.  Performance status (ECOG): 2 - Symptomatic, <50% confined to bed  PHYSICAL EXAM:   Physical Exam Vitals and nursing note reviewed. Exam conducted with a chaperone present.  Constitutional:      Appearance: Normal appearance.  Cardiovascular:     Rate and Rhythm: Normal rate and regular rhythm.     Pulses: Normal pulses.     Heart sounds: Normal heart sounds.  Pulmonary:     Effort: Pulmonary effort is normal.     Breath sounds: Normal breath sounds.  Abdominal:     Palpations: Abdomen is soft. There is no hepatomegaly, splenomegaly or mass.     Tenderness: There is no abdominal tenderness.  Musculoskeletal:     Right lower leg: No edema.     Left lower leg: No edema.  Lymphadenopathy:     Cervical: No cervical adenopathy.     Right cervical: No superficial, deep or posterior cervical adenopathy.    Left cervical:  No superficial, deep or posterior cervical adenopathy.     Upper Body:     Right upper body: No supraclavicular or axillary adenopathy.     Left upper body: No supraclavicular or axillary adenopathy.  Skin:    Comments: Diffuse desquamation of the skin on the upper chest, back, on the legs and upper extremities.  Neurological:     General: No focal deficit present.     Mental Status: He is alert and oriented to person, place, and time.  Psychiatric:        Mood and Affect: Mood normal.        Behavior: Behavior normal.     LABS:      Latest Ref Rng & Units 05/10/2023   12:55 PM 04/20/2023    9:50 AM 03/29/2023    9:06 AM  CBC  WBC 4.0 - 10.5 K/uL 7.7  8.0  6.7   Hemoglobin 13.0 - 17.0 g/dL 9.1  57.3  22.0   Hematocrit 39.0 - 52.0 % 27.7  31.2  31.0   Platelets 150 - 400 K/uL 237  195  195       Latest Ref Rng & Units 05/10/2023   12:55 PM 04/20/2023    9:50 AM 03/29/2023    9:06 AM  CMP  Glucose 70 - 99 mg/dL 254  270  623   BUN 8 - 23 mg/dL 35  27  23   Creatinine 0.61 - 1.24 mg/dL 7.62  8.31  5.17   Sodium 135 - 145 mmol/L 141  132  136   Potassium 3.5 - 5.1 mmol/L 4.0  3.9  4.5   Chloride 98 - 111 mmol/L 113  106  107   CO2 22 - 32 mmol/L 18  17  23   Calcium 8.9 - 10.3 mg/dL 8.1  8.4  8.7   Total Protein 6.5 - 8.1 g/dL 6.2  6.6  6.7   Total Bilirubin 0.3 - 1.2 mg/dL 0.9  0.7  0.6   Alkaline Phos 38 - 126 U/L 67  122  110   AST 15 - 41 U/L 34  48  48   ALT 0 - 44 U/L 35  45  51      No results found for: "CEA1", "CEA" / No results found for: "CEA1", "CEA" No results found for: "PSA1" No results found for: "ZOX096" No results found for: "CAN125"  No results found for: "TOTALPROTELP", "ALBUMINELP", "A1GS", "A2GS", "BETS", "BETA2SER", "GAMS", "MSPIKE", "SPEI" Lab Results  Component Value Date   TIBC 250 01/20/2023   TIBC 271 04/03/2009   FERRITIN 655 (H) 01/20/2023   FERRITIN 870 (H) 04/03/2009   IRONPCTSAT 26 01/20/2023   IRONPCTSAT 72 (H) 04/03/2009   Lab  Results  Component Value Date   LDH 248 (H) 01/20/2023     STUDIES:   No results found.

## 2023-05-10 NOTE — Progress Notes (Signed)
Patient tolerated IVF and Solu-medrol well with no complaints voiced.  Patient left via wheelchair with wife in stable condition.  Vital signs stable at discharge.  Follow up as scheduled.

## 2023-05-10 NOTE — Patient Instructions (Signed)

## 2023-05-11 ENCOUNTER — Inpatient Hospital Stay: Payer: Medicare PPO

## 2023-05-11 VITALS — BP 123/83 | HR 91 | Temp 95.9°F | Resp 20

## 2023-05-11 DIAGNOSIS — C7972 Secondary malignant neoplasm of left adrenal gland: Secondary | ICD-10-CM | POA: Diagnosis not present

## 2023-05-11 DIAGNOSIS — I129 Hypertensive chronic kidney disease with stage 1 through stage 4 chronic kidney disease, or unspecified chronic kidney disease: Secondary | ICD-10-CM | POA: Diagnosis not present

## 2023-05-11 DIAGNOSIS — C7951 Secondary malignant neoplasm of bone: Secondary | ICD-10-CM | POA: Diagnosis not present

## 2023-05-11 DIAGNOSIS — C642 Malignant neoplasm of left kidney, except renal pelvis: Secondary | ICD-10-CM | POA: Diagnosis not present

## 2023-05-11 DIAGNOSIS — N189 Chronic kidney disease, unspecified: Secondary | ICD-10-CM | POA: Diagnosis not present

## 2023-05-11 DIAGNOSIS — R21 Rash and other nonspecific skin eruption: Secondary | ICD-10-CM | POA: Diagnosis not present

## 2023-05-11 DIAGNOSIS — L270 Generalized skin eruption due to drugs and medicaments taken internally: Secondary | ICD-10-CM | POA: Diagnosis not present

## 2023-05-11 DIAGNOSIS — M25512 Pain in left shoulder: Secondary | ICD-10-CM | POA: Diagnosis not present

## 2023-05-11 DIAGNOSIS — Z5112 Encounter for antineoplastic immunotherapy: Secondary | ICD-10-CM | POA: Diagnosis not present

## 2023-05-11 MED ORDER — SODIUM CHLORIDE 0.9 % IV SOLN
1000.0000 mg | Freq: Once | INTRAVENOUS | Status: AC
  Administered 2023-05-11: 1000 mg via INTRAVENOUS
  Filled 2023-05-11: qty 1000

## 2023-05-11 MED ORDER — SODIUM CHLORIDE 0.9 % IV SOLN: INTRAVENOUS | Status: DC

## 2023-05-11 MED ORDER — SODIUM CHLORIDE 0.9% FLUSH
10.0000 mL | INTRAVENOUS | Status: DC | PRN
  Administered 2023-05-11: 10 mL via INTRAVENOUS

## 2023-05-11 MED ORDER — HEPARIN SOD (PORK) LOCK FLUSH 100 UNIT/ML IV SOLN
500.0000 [IU] | Freq: Once | INTRAVENOUS | Status: AC
  Administered 2023-05-11: 500 [IU] via INTRAVENOUS

## 2023-05-11 NOTE — Progress Notes (Signed)
Fluids and solumedrol infusions given per orders. Patient tolerated it well without problems. Vitals stable and discharged home from clinic ambulatory. Follow up as scheduled.

## 2023-05-11 NOTE — Progress Notes (Signed)
[facility] [address] [place] [address]    Clinic Day:  05/12/2023  Referring physician: Assunta Found, MD  Patient Care Team: Assunta Found, MD as PCP - General (Family Medicine) Doreatha Massed, MD as Medical Oncologist (Medical Oncology) Therese Sarah, RN as Oncology Nurse Navigator (Medical Oncology)   ASSESSMENT & PLAN:   Assessment: 1.  Metastatic left kidney clear-cell RCC: - Presentation with left shoulder pain.  Weight loss of 50 pounds in the last 6 months but was trying to lose weight by cutting back on eating and doing some walking. - Left robotic assisted laparoscopic radical nephrectomy by Dr. Ronne Binning on 08/13/2022 - Pathology: 7 cm clear-cell RCC with focal sarcomatoid and rhabdoid features, nuclear grade 4.  Tumor extends into the renal vein and renal sinus fat (PT3a).  Ureteral, vascular and all margins negative for tumor. - PET scan (12/24/2022): Enlarging hypermetabolic left adrenal metastasis.  2 small hypermetabolic soft tissue nodules in the left nephrectomy bed.  Hypermetabolic lytic lesions involving distal left clavicle and left temporal bone.  Indeterminate small hypermetabolic parotid nodules bilaterally. - IMDC: At least intermediate pending labs - Lenvatinib 14 mg daily started on 01/18/2023, Keytruda on 01/22/2023, dose decreased to 10 mg on 01/28/2023 due to elevated blood pressure   2.  Social/family history: - Lives by himself and is independent of ADLs and IADLs.  He does not drive.  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  Current active smoker, half pack per day, started at age 21. - Mother might have had cancer, he is not sure.    Plan: 1.  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 - Reviewed PET scan from 05/06/2023: Mixed response with improvement in prior metastasis but new nodal/bone lesions. -  Recommend starting cabozantinib as second line therapy. - Will start at lower dose of 40 mg daily and titrate up to 60 mg daily if tolerated well. - Discussed side effects in detail including hypotension, skin rashes, electrolyte abnormalities, GI side effects, cytopenias among others. - Will reevaluate him in a month due to his rash.  We will start cabozantinib at that time once his rash improves.   2.  Hypertension: - Continue to hold lisinopril and amlodipine.  Blood pressure today is 127/68.  Improved from Monday.   3.  Epidermal necrolysis: - He has generalized rash with skin peeling of. - We started him on methylprednisone 1 g daily on 05/10/2023 along with IV fluids. - She was evaluated by dermatology yesterday.  He was started on prophylactic antibiotic with Keflex.  He is also continuing to use steroid cream. - Skin lesions have improved.  No new lesions.  He did not have involvement of the oral mucosa or eyes. - He will receive last dose of methylprednisone 1 g today.    No orders of the defined types were placed in this encounter.     I,Katie Daubenspeck,acting as a Neurosurgeon for Doreatha Massed, MD.,have documented all relevant documentation on the behalf of Doreatha Massed, MD,as directed by  Doreatha Massed, MD while in the presence of Doreatha Massed, MD.   I, Doreatha Massed MD, have reviewed the above documentation for accuracy and completeness, and I agree with the above.   Doreatha Massed, MD   8/28/20245:39 PM  CHIEF COMPLAINT:   Diagnosis: metastatic clear cell renal cell carcinoma    Cancer Staging  Clear cell renal cell carcinoma, left (HCC) Staging form: Kidney, AJCC 8th Edition - Clinical stage from 01/12/2023: Stage IV (cT3a, cNX, cM1) - Unsigned    Prior Therapy: left nephrectomy 08/13/22   Current Therapy:  Lenvatinib and pembrolizumab    HISTORY OF PRESENT ILLNESS:   Oncology History  Clear cell renal cell carcinoma, left  (HCC)  01/12/2023 Initial Diagnosis   Clear cell renal cell carcinoma, left (HCC)   01/22/2023 -  Chemotherapy   Patient is on Treatment Plan : KIDNEY Pembrolizumab (200) q21d        INTERVAL HISTORY:   David [name] is a 72 y.o. male presenting to clinic today for follow up of metastatic clear cell renal cell carcinoma. He was last seen by me on 05/10/23.  Today, he states that he is doing well overall. His appetite level is at 75%. His energy level is at 75%.  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 Ronal Fear note   Pneumonia    Seizure [facility])     Surgical History: Past Surgical History:  Procedure Laterality Date   COLONOSCOPY  05/31/2012   Procedure: COLONOSCOPY;  Surgeon: Dalia Heading, MD;  Location: [facility];  Service: Gastroenterology;  Laterality: N/A;   PORTACATH PLACEMENT Right 01/27/2023   Procedure: INSERTION PORT-A-CATH, RIJ;  Surgeon: Lewie Chamber, DO;  Location: [facility] ORS;  Service: General;  Laterality: Right;   ROBOT ASSISTED LAPAROSCOPIC NEPHRECTOMY Left 08/13/2022   Procedure: XI ROBOTIC ASSISTED LAPAROSCOPIC NEPHRECTOMY;  Surgeon: Malen Gauze, MD;  Location: [facility] ORS;  Service: Urology;  Laterality: Left;   TONSILLECTOMY      Social History: Social History   Socioeconomic History   Marital status: Divorced    Spouse name: Not on file   Number of children: Not on file   Years of education: Not on file   Highest education level: Not on file  Occupational History   Not on file  Tobacco Use   Smoking status: Every Day    Current packs/day: 0.50    Average packs/day: 0.5 packs/day for 65.0 years (32.5 ttl pk-yrs)    Types: Cigarettes   Smokeless tobacco: Never  Vaping Use   Vaping status: Never Used  Substance and Sexual Activity   Alcohol use: No   Drug  use: No   Sexual activity: Not Currently  Other Topics Concern   Not on file  Social History Narrative   Not on file   Social Determinants of Health   Financial Resource Strain: Low Risk  (03/24/2023)   Received from [facility]   Overall Financial Resource Strain (CARDIA)    Difficulty of Paying Living Expenses: Not hard at all  Recent Concern: Financial Resource Strain - High Risk (02/01/2023)   Overall Financial Resource Strain (CARDIA)    Difficulty of Paying Living Expenses: Very hard  Food Insecurity: No Food Insecurity (03/24/2023)   [facility] from [facility]   Hunger Vital Sign    Worried About Running Out of Food in the Last Year: Never true    Ran Out of Food in the Last Year: Never true  Transportation Needs: No Transportation Needs (03/24/2023)   Received from [facility] - Transportation    Lack of Transportation (Medical): No    Lack of  Transportation (Non-Medical): No  Physical Activity: Not on file  Stress: Patient Declined (03/07/2023)   Received from [facility] - Occupational Stress Questionnaire    Feeling of Stress : Patient declined  Social Connections: Unknown (03/07/2023)   Received from [facility]   Social Network    Social Network: Not on file  Intimate Partner Violence: Not At Risk (03/08/2023)   Received from [facility], [facility]   HITS    Over the last 12 months how often did your partner physically hurt you?: 1    Over the last 12 months how often did your partner insult you or talk down to you?: 1    Over the last 12 months how often did your partner threaten you with physical harm?: 1    Over the last 12 months how often did your partner scream or curse at you?: 1    Family History: No family history on file.  Current Medications:  Current Outpatient Medications:    cephALEXin (KEFLEX) 500 MG capsule, Take 500 mg by mouth 3 (three) times  daily., Disp: , Rfl:    Clobetasol Prop Emollient Base (CLOBETASOL PROPIONATE E) 0.05 % emollient cream, Apply 1 Application topically 2 (two) times daily., Disp: 30 g, Rfl: 0   levETIRAcetam (KEPPRA) 500 MG tablet, Take by mouth., Disp: , Rfl:    lidocaine (XYLOCAINE) 5 % ointment, Apply 1 Application topically 2 (two) times daily as needed for moderate pain., Disp: 35.44 g, Rfl: 0   lidocaine-prilocaine (EMLA) cream, Apply to affected area once, Disp: 30 g, Rfl: 3   Pembrolizumab (KEYTRUDA IV), Inject into the vein every 21 ( twenty-one) days., Disp: , Rfl:    tamsulosin (FLOMAX) 0.4 MG CAPS capsule, Take 0.4 mg by mouth daily. , Disp: , Rfl:    triamcinolone cream (KENALOG) 0.1 %, SMARTSIG:1 Application Topical 2-3 Times Daily, Disp: , Rfl:    amLODipine (NORVASC) 10 MG tablet, Take 1 tablet (10 mg total) by mouth daily., Disp: 30 tablet, Rfl: 6   lenvatinib 14 mg daily dose (LENVIMA) 10 & 4 MG capsule, Take 14 mg by mouth daily. (Take one 10mg  capsule and one 4mg  capsule for a total dose of 14mg ), Disp: 60 capsule, Rfl: 0   lisinopril (ZESTRIL) 10 MG tablet, Take by mouth., Disp: , Rfl:    prochlorperazine (COMPAZINE) 10 MG tablet, Take 1 tablet (10 mg total) by mouth every 6 (six) hours as needed for nausea or vomiting., Disp: 30 tablet, Rfl: 1   Allergies: No Known Allergies  REVIEW OF SYSTEMS:   Review of Systems  Constitutional:  Negative for chills, fatigue and fever.  HENT:   Positive for trouble swallowing. Negative for lump/mass, mouth sores, nosebleeds and sore throat.   Eyes:  Negative for eye problems.  Respiratory:  Positive for shortness of breath. Negative for cough.   Cardiovascular:  Negative for chest pain, leg swelling and palpitations.  Gastrointestinal:  Negative for abdominal pain, constipation, diarrhea, nausea and vomiting.  Genitourinary:  Negative for bladder incontinence, difficulty urinating, dysuria, frequency, hematuria and nocturia.   Musculoskeletal:   Negative for arthralgias, back pain, flank pain, myalgias and neck pain.  Skin:  Positive for itching and rash.  Neurological:  Negative for dizziness, headaches and numbness.  Hematological:  Does not bruise/bleed easily.  Psychiatric/Behavioral:  Positive for sleep disturbance. Negative for depression and suicidal ideas. The patient is not nervous/anxious.   All other systems reviewed  and are negative.    VITALS:   Blood pressure 127/68, pulse 87, temperature 97.7 F (36.5 C), temperature source Oral, resp. rate 18, weight 235 lb 6.4 oz (106.8 kg), SpO2 100%.  Wt Readings from Last 3 Encounters:  05/12/23 235 lb 6.4 oz (106.8 kg)  04/20/23 232 lb (105.2 kg)  04/05/23 230 lb (104.3 kg)    Body mass index is 32.37 kg/m.  Performance status (ECOG): 1 - Symptomatic but completely ambulatory  PHYSICAL EXAM:   Physical Exam Vitals and nursing note reviewed. Exam conducted with a chaperone present.  Constitutional:      Appearance: Normal appearance.  Cardiovascular:     Rate and Rhythm: Normal rate and regular rhythm.     Pulses: Normal pulses.     Heart sounds: Normal heart sounds.  Pulmonary:     Effort: Pulmonary effort is normal.     Breath sounds: Normal breath sounds.  Abdominal:     Palpations: Abdomen is soft. There is no hepatomegaly, splenomegaly or mass.     Tenderness: There is no abdominal tenderness.  Musculoskeletal:     Right lower leg: No edema.     Left lower leg: No edema.  Lymphadenopathy:     Cervical: No cervical adenopathy.     Right cervical: No superficial, deep or posterior cervical adenopathy.    Left cervical: No superficial, deep or posterior cervical adenopathy.     Upper Body:     Right upper body: No supraclavicular or axillary adenopathy.     Left upper body: No supraclavicular or axillary adenopathy.  Neurological:     General: No focal deficit present.     Mental Status: He is alert and oriented to person, place, and time.   Psychiatric:        Mood and Affect: Mood normal.        Behavior: Behavior normal.     LABS:      Latest Ref Rng & Units 05/12/2023    9:38 AM 05/10/2023   12:55 PM 04/20/2023    9:50 AM  CBC  WBC 4.0 - 10.5 K/uL 4.5  7.7  8.0   Hemoglobin 13.0 - 17.0 g/dL 8.5  9.1  95.2   Hematocrit 39.0 - 52.0 % 25.6  27.7  31.2   Platelets 150 - 400 K/uL 217  237  195       Latest Ref Rng & Units 05/12/2023    9:38 AM 05/10/2023   12:55 PM 04/20/2023    9:50 AM  CMP  Glucose 70 - 99 mg/dL 841  324  401   BUN 8 - 23 mg/dL 33  35  27   Creatinine 0.61 - 1.24 mg/dL 0.27  2.53  6.64   Sodium 135 - 145 mmol/L 140  141  132   Potassium 3.5 - 5.1 mmol/L 4.1  4.0  3.9   Chloride 98 - 111 mmol/L 113  113  106   CO2 22 - 32 mmol/L 17  18  17    Calcium 8.9 - 10.3 mg/dL 8.3  8.1  8.4   Total Protein 6.5 - 8.1 g/dL 6.7  6.2  6.6   Total Bilirubin 0.3 - 1.2 mg/dL 0.7  0.9  0.7   Alkaline Phos 38 - 126 U/L 66  67  122   AST 15 - 41 U/L 28  34  48   ALT 0 - 44 U/L 38  35  45      No results found for: "  CEA1", "CEA" / No results found for: "CEA1", "CEA" No results found for: "PSA1" No results found for: "OVF643" No results found for: "CAN125"  No results found for: "TOTALPROTELP", "ALBUMINELP", "A1GS", "A2GS", "BETS", "BETA2SER", "GAMS", "MSPIKE", "SPEI" Lab Results  Component Value Date   TIBC 250 01/20/2023   TIBC 271 04/03/2009   FERRITIN 655 (H) 01/20/2023   FERRITIN 870 (H) 04/03/2009   IRONPCTSAT 26 01/20/2023   IRONPCTSAT 72 (H) 04/03/2009   Lab Results  Component Value Date   LDH 248 (H) 01/20/2023     STUDIES:   NM PET Image Restag (PS) Skull Base To Thigh  Result Date: 05/12/2023 CLINICAL DATA:  Subsequent treatment strategy for left renal cell carcinoma. Status post left nephrectomy. EXAM: NUCLEAR MEDICINE PET SKULL BASE TO THIGH TECHNIQUE: 12.2 mCi F-18 FDG was injected intravenously. Full-ring PET imaging was performed from the skull base to thigh after the radiotracer. CT  data was obtained and used for attenuation correction and anatomic localization. 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 7 mm short axis right level 2 node (series 202/image 27), max SUV 6.4, new/progressive. Incidental CT findings: None. CHEST: Small bilateral axillary nodes, grossly unchanged. However, there is associated mild hypermetabolism, including within an 8 mm short axis left axillary node (series 2/image 54), max SUV 3.0. This is considered indeterminate/equivocal. No suspicious pulmonary nodules. Right chest port terminates in the mid SVC. Incidental CT findings: Atherosclerotic calcifications of the aortic arch. Mild coronary atherosclerosis of the LAD. ABDOMEN/PELVIS: Status post left nephrectomy. 14 mm soft tissue nodule in the surgical bed (series 202/image 100), max SUV 5.5, previously 4.7. Prior left adrenal metastasis is no longer evident, without hypermetabolism. 7 mm short axis left para-aortic node (series 202/image 211), max SUV 2.7. New bilateral inguinal nodes measuring up to 13 mm short axis on the left (series 202/image 153), max SUV 4.4. Incidental CT findings: Atherosclerotic calcifications of the abdominal aorta and branch vessels. Small fat containing left inguinal hernia. SKELETON: New osseous metastases, as follows: --Left inferior scapula, max SUV 9.1 --Right posteromedial 8th rib, max SUV 8.2 --Left T12 vertebral body, max SUV 9.9 --Left lateral 10th rib, max SUV 3.1 Improving osseous metastasis involving the left lateral clavicle, max SUV 3.3, previously 13.5. Prior left temporal bone lytic lesion is no longer FDG avid. Incidental CT findings: None. IMPRESSION: Overall mixed response, with improvement of prior metastases, but new nodal/osseous lesions  as described above. Status post left nephrectomy. Stable soft tissue nodule in the surgical bed, suggesting recurrence. Associated left adrenal metastasis has resolved. New/progressive right cervical, retroperitoneal, and bilateral inguinal nodal metastases. Small bilateral axillary nodes are indeterminate. Prior left temporal and left clavicular osseous metastases are improved. However, there are additional new/progressive osseous metastases, as above. Electronically Signed   By: Charline Bills M.D.   On: 05/12/2023 10:55

## 2023-05-11 NOTE — Patient Instructions (Signed)
[facility]  Discharge Instructions: Thank you for choosing [facility] to provide your oncology and hematology care.  If you have a lab appointment with the Cancer Center - please note that after April 8th, 2024, all labs will be drawn in the cancer center.  You do not have to check in or register with the main entrance as you have in the past but will complete your check-in in the cancer center.  Wear comfortable clothing and clothing appropriate for easy access to any Portacath or PICC line.   We strive to give you quality time with your provider. You may need to reschedule your appointment if you arrive late (15 or more minutes).  Arriving late affects you and other patients whose appointments are after yours.  Also, if you miss three or more appointments without notifying the office, you may be dismissed from the clinic at the provider's discretion.      For prescription refill requests, have your pharmacy contact our office and allow 72 hours for refills to be completed.    Today you received the following hydration fluids and solumedrol infusion as ordered   To help prevent nausea and vomiting after your treatment, we encourage you to take your nausea medication as directed.  BELOW ARE SYMPTOMS THAT SHOULD BE REPORTED IMMEDIATELY: *FEVER GREATER THAN 100.4 F (38 C) OR HIGHER *CHILLS OR SWEATING *NAUSEA AND VOMITING THAT IS NOT CONTROLLED WITH YOUR NAUSEA MEDICATION *UNUSUAL SHORTNESS OF BREATH *UNUSUAL BRUISING OR BLEEDING *URINARY PROBLEMS (pain or burning when urinating, or frequent urination) *BOWEL PROBLEMS (unusual diarrhea, constipation, pain near the anus) TENDERNESS IN MOUTH AND THROAT WITH OR WITHOUT PRESENCE OF ULCERS (sore throat, sores in mouth, or a toothache) UNUSUAL RASH, SWELLING OR PAIN  UNUSUAL VAGINAL DISCHARGE OR ITCHING   Items with * indicate a potential emergency and should be followed up as soon as possible or go to the  Emergency Department if any problems should occur.  Please show the CHEMOTHERAPY ALERT CARD or IMMUNOTHERAPY ALERT CARD at check-in to the Emergency Department and triage nurse.  Should you have questions after your visit or need to cancel or reschedule your appointment, please contact [facility] [phone]  and follow the prompts.  Office hours are 8:00 a.m. to 4:30 p.m. Monday - Friday. Please note that voicemails left after 4:00 p.m. may not be returned until the following business day.  We are closed weekends and major holidays. You have access to a nurse at all times for urgent questions. Please call the main number to the clinic [phone] and follow the prompts.  For any non-urgent questions, you may also contact your provider using MyChart. We now offer e-Visits for anyone 38 and older to request care online for non-urgent symptoms. For details visit [email or web address].   Also download the MyChart app! Go to the app store, search "MyChart", open the app, select [facility] [place], and log in with your MyChart username and password.

## 2023-05-12 ENCOUNTER — Inpatient Hospital Stay: Payer: Medicare PPO

## 2023-05-12 ENCOUNTER — Ambulatory Visit: Payer: Medicare PPO | Admitting: Hematology

## 2023-05-12 ENCOUNTER — Other Ambulatory Visit: Payer: Medicare PPO

## 2023-05-12 ENCOUNTER — Inpatient Hospital Stay (HOSPITAL_BASED_OUTPATIENT_CLINIC_OR_DEPARTMENT_OTHER): Payer: Medicare PPO | Admitting: Hematology

## 2023-05-12 ENCOUNTER — Ambulatory Visit: Payer: Medicare PPO

## 2023-05-12 VITALS — BP 127/68 | HR 87 | Temp 97.7°F | Resp 18 | Wt 235.4 lb

## 2023-05-12 DIAGNOSIS — M25512 Pain in left shoulder: Secondary | ICD-10-CM | POA: Diagnosis not present

## 2023-05-12 DIAGNOSIS — C642 Malignant neoplasm of left kidney, except renal pelvis: Secondary | ICD-10-CM

## 2023-05-12 DIAGNOSIS — R21 Rash and other nonspecific skin eruption: Secondary | ICD-10-CM | POA: Diagnosis not present

## 2023-05-12 DIAGNOSIS — N189 Chronic kidney disease, unspecified: Secondary | ICD-10-CM | POA: Diagnosis not present

## 2023-05-12 DIAGNOSIS — Z5112 Encounter for antineoplastic immunotherapy: Secondary | ICD-10-CM | POA: Diagnosis not present

## 2023-05-12 DIAGNOSIS — C7972 Secondary malignant neoplasm of left adrenal gland: Secondary | ICD-10-CM | POA: Diagnosis not present

## 2023-05-12 DIAGNOSIS — C7951 Secondary malignant neoplasm of bone: Secondary | ICD-10-CM | POA: Diagnosis not present

## 2023-05-12 DIAGNOSIS — I129 Hypertensive chronic kidney disease with stage 1 through stage 4 chronic kidney disease, or unspecified chronic kidney disease: Secondary | ICD-10-CM | POA: Diagnosis not present

## 2023-05-12 LAB — CBC WITH DIFFERENTIAL/PLATELET
Abs Immature Granulocytes: 0.08 10*3/uL — ABNORMAL HIGH (ref 0.00–0.07)
Basophils Absolute: 0 10*3/uL (ref 0.0–0.1)
Basophils Relative: 0 %
Eosinophils Absolute: 0 10*3/uL (ref 0.0–0.5)
Eosinophils Relative: 0 %
HCT: 25.6 % — ABNORMAL LOW (ref 39.0–52.0)
Hemoglobin: 8.5 g/dL — ABNORMAL LOW (ref 13.0–17.0)
Immature Granulocytes: 2 %
Lymphocytes Relative: 30 %
Lymphs Abs: 1.4 10*3/uL (ref 0.7–4.0)
MCH: 34.4 pg — ABNORMAL HIGH (ref 26.0–34.0)
MCHC: 33.2 g/dL (ref 30.0–36.0)
MCV: 103.6 fL — ABNORMAL HIGH (ref 80.0–100.0)
Monocytes Absolute: 0.9 10*3/uL (ref 0.1–1.0)
Monocytes Relative: 21 %
Neutro Abs: 2.2 10*3/uL (ref 1.7–7.7)
Neutrophils Relative %: 47 %
Platelets: 217 10*3/uL (ref 150–400)
RBC: 2.47 MIL/uL — ABNORMAL LOW (ref 4.22–5.81)
RDW: 22.7 % — ABNORMAL HIGH (ref 11.5–15.5)
WBC: 4.5 10*3/uL (ref 4.0–10.5)
nRBC: 2.9 % — ABNORMAL HIGH (ref 0.0–0.2)

## 2023-05-12 LAB — COMPREHENSIVE METABOLIC PANEL
ALT: 38 U/L (ref 0–44)
AST: 28 U/L (ref 15–41)
Albumin: 3.5 g/dL (ref 3.5–5.0)
Alkaline Phosphatase: 66 U/L (ref 38–126)
Anion gap: 10 (ref 5–15)
BUN: 33 mg/dL — ABNORMAL HIGH (ref 8–23)
CO2: 17 mmol/L — ABNORMAL LOW (ref 22–32)
Calcium: 8.3 mg/dL — ABNORMAL LOW (ref 8.9–10.3)
Chloride: 113 mmol/L — ABNORMAL HIGH (ref 98–111)
Creatinine, Ser: 1.37 mg/dL — ABNORMAL HIGH (ref 0.61–1.24)
GFR, Estimated: 55 mL/min — ABNORMAL LOW (ref >60)
Glucose, Bld: 133 mg/dL — ABNORMAL HIGH (ref 70–99)
Potassium: 4.1 mmol/L (ref 3.5–5.1)
Sodium: 140 mmol/L (ref 135–145)
Total Bilirubin: 0.7 mg/dL (ref 0.3–1.2)
Total Protein: 6.7 g/dL (ref 6.5–8.1)

## 2023-05-12 MED ORDER — SODIUM CHLORIDE 0.9 % IV SOLN: Freq: Once | INTRAVENOUS | Status: AC

## 2023-05-12 MED ORDER — SODIUM CHLORIDE 0.9 % IV SOLN
1000.0000 mg | Freq: Once | INTRAVENOUS | Status: AC
  Administered 2023-05-12: 1000 mg via INTRAVENOUS
  Filled 2023-05-12: qty 1000

## 2023-05-12 MED ORDER — HEPARIN SOD (PORK) LOCK FLUSH 100 UNIT/ML IV SOLN
500.0000 [IU] | Freq: Once | INTRAVENOUS | Status: AC
  Administered 2023-05-12: 500 [IU] via INTRAVENOUS

## 2023-05-12 MED ORDER — SODIUM CHLORIDE 0.9% FLUSH
10.0000 mL | Freq: Once | INTRAVENOUS | Status: AC
  Administered 2023-05-12: 10 mL via INTRAVENOUS

## 2023-05-12 NOTE — Progress Notes (Signed)
Patient presents today for 1 Liter of normal saline over two hours and 1g of Solu-medrol IV per provider's order. Vital signs stable and pt voiced no new complaints at this time.  Treatment given today per MD orders. Tolerated infusion without adverse affects. Vital signs stable. No complaints at this time. Discharged from clinic via wheelchair in stable condition. Alert and oriented x 3. F/U with [facility] as scheduled.

## 2023-05-12 NOTE — Patient Instructions (Addendum)
[facility] Discharge Instructions   You were seen and examined today by Dr. Ellin Saba.  He reviewed the results of your lab work which is normal/stable.   He reviewed the results of your PET scan. It shows a mixed response to therapy. The spots that were previously there in the bones have improved, but there are new areas in the spine and on the ribs. Dr. Kirtland Bouchard wants you to stop the pills completely that you were taking. We will need to change treatment. He discussed with you another pill for you to take for your treatment. We will hold off on any treatment until your rash heals up completely.    We will give you steroids and fluids today.   We will see you back in one month. We will repeat lab work   Return as scheduled.    Thank you for choosing [facility] at [facility] to provide your oncology and hematology care.  To afford each patient quality time with our provider, please arrive at least 15 minutes before your scheduled appointment time.   If you have a lab appointment with the Cancer Center please come in thru the Main Entrance and check in at the main information desk.  You need to re-schedule your appointment should you arrive 10 or more minutes late.  We strive to give you quality time with our providers, and arriving late affects you and other patients whose appointments are after yours.  Also, if you no show three or more times for appointments you may be dismissed from the clinic at the providers discretion.     Again, thank you for choosing [facility].  Our hope is that these requests will decrease the amount of time that you wait before being seen by our physicians.       _____________________________________________________________  Should you have questions after your visit to [facility], please contact our office at [phone] and follow the prompts.  Our office hours are 8:00 a.m.  and 4:30 p.m. Monday - Friday.  Please note that voicemails left after 4:00 p.m. may not be returned until the following business day.  We are closed weekends and major holidays.  You do have access to a nurse 24-7, just call the main number to the clinic [phone] and do not press any options, hold on the line and a nurse will answer the phone.    For prescription refill requests, have your pharmacy contact our office and allow 72 hours.    Due to Covid, you will need to wear a mask upon entering the hospital. If you do not have a mask, a mask will be given to you at the Main Entrance upon arrival. For doctor visits, patients may have 1 support person age 93 or older with them. For treatment visits, patients can not have anyone with them due to social distancing guidelines and our immunocompromised population.

## 2023-05-12 NOTE — Patient Instructions (Signed)
[facility]  Discharge Instructions: Thank you for choosing [facility] to provide your oncology and hematology care.  If you have a lab appointment with the Cancer Center - please note that after April 8th, 2024, all labs will be drawn in the cancer center.  You do not have to check in or register with the main entrance as you have in the past but will complete your check-in in the cancer center.  Wear comfortable clothing and clothing appropriate for easy access to any Portacath or PICC line.   We strive to give you quality time with your provider. You may need to reschedule your appointment if you arrive late (15 or more minutes).  Arriving late affects you and other patients whose appointments are after yours.  Also, if you miss three or more appointments without notifying the office, you may be dismissed from the clinic at the provider's discretion.      For prescription refill requests, have your pharmacy contact our office and allow 72 hours for refills to be completed.    Today you received 1 Liter of NS and 1g of Soul-medrol     BELOW ARE SYMPTOMS THAT SHOULD BE REPORTED IMMEDIATELY: *FEVER GREATER THAN 100.4 F (38 C) OR HIGHER *CHILLS OR SWEATING *NAUSEA AND VOMITING THAT IS NOT CONTROLLED WITH YOUR NAUSEA MEDICATION *UNUSUAL SHORTNESS OF BREATH *UNUSUAL BRUISING OR BLEEDING *URINARY PROBLEMS (pain or burning when urinating, or frequent urination) *BOWEL PROBLEMS (unusual diarrhea, constipation, pain near the anus) TENDERNESS IN MOUTH AND THROAT WITH OR WITHOUT PRESENCE OF ULCERS (sore throat, sores in mouth, or a toothache) UNUSUAL RASH, SWELLING OR PAIN  UNUSUAL VAGINAL DISCHARGE OR ITCHING   Items with * indicate a potential emergency and should be followed up as soon as possible or go to the Emergency Department if any problems should occur.  Please show the CHEMOTHERAPY ALERT CARD or IMMUNOTHERAPY ALERT CARD at check-in to the Emergency  Department and triage nurse.  Should you have questions after your visit or need to cancel or reschedule your appointment, please contact [facility] [phone]  and follow the prompts.  Office hours are 8:00 a.m. to 4:30 p.m. Monday - Friday. Please note that voicemails left after 4:00 p.m. may not be returned until the following business day.  We are closed weekends and major holidays. You have access to a nurse at all times for urgent questions. Please call the main number to the clinic [phone] and follow the prompts.  For any non-urgent questions, you may also contact your provider using MyChart. We now offer e-Visits for anyone 29 and older to request care online for non-urgent symptoms. For details visit [email or web address].   Also download the MyChart app! Go to the app store, search "MyChart", open the app, select , and log in with your MyChart username and password.

## 2023-05-13 ENCOUNTER — Other Ambulatory Visit: Payer: Self-pay

## 2023-05-14 ENCOUNTER — Other Ambulatory Visit: Payer: Self-pay

## 2023-05-20 ENCOUNTER — Other Ambulatory Visit: Payer: Self-pay

## 2023-05-31 ENCOUNTER — Inpatient Hospital Stay: Payer: Medicare PPO

## 2023-05-31 ENCOUNTER — Inpatient Hospital Stay: Payer: Medicare PPO | Attending: Hematology

## 2023-06-21 ENCOUNTER — Inpatient Hospital Stay: Payer: Medicare PPO

## 2023-06-21 ENCOUNTER — Telehealth: Payer: Self-pay | Admitting: Pharmacist

## 2023-06-21 ENCOUNTER — Encounter: Payer: Self-pay | Admitting: Hematology

## 2023-06-21 ENCOUNTER — Telehealth: Payer: Self-pay

## 2023-06-21 ENCOUNTER — Inpatient Hospital Stay: Payer: Medicare PPO | Attending: Hematology | Admitting: Hematology

## 2023-06-21 ENCOUNTER — Encounter: Payer: Self-pay | Admitting: *Deleted

## 2023-06-21 ENCOUNTER — Other Ambulatory Visit (HOSPITAL_COMMUNITY): Payer: Self-pay

## 2023-06-21 VITALS — BP 159/63 | HR 65 | Temp 97.2°F | Resp 20 | Wt 220.0 lb

## 2023-06-21 DIAGNOSIS — C7972 Secondary malignant neoplasm of left adrenal gland: Secondary | ICD-10-CM | POA: Diagnosis not present

## 2023-06-21 DIAGNOSIS — Z452 Encounter for adjustment and management of vascular access device: Secondary | ICD-10-CM | POA: Diagnosis not present

## 2023-06-21 DIAGNOSIS — N189 Chronic kidney disease, unspecified: Secondary | ICD-10-CM | POA: Diagnosis not present

## 2023-06-21 DIAGNOSIS — I129 Hypertensive chronic kidney disease with stage 1 through stage 4 chronic kidney disease, or unspecified chronic kidney disease: Secondary | ICD-10-CM | POA: Diagnosis not present

## 2023-06-21 DIAGNOSIS — Z95828 Presence of other vascular implants and grafts: Secondary | ICD-10-CM

## 2023-06-21 DIAGNOSIS — C642 Malignant neoplasm of left kidney, except renal pelvis: Secondary | ICD-10-CM | POA: Insufficient documentation

## 2023-06-21 DIAGNOSIS — D508 Other iron deficiency anemias: Secondary | ICD-10-CM

## 2023-06-21 LAB — CBC WITH DIFFERENTIAL/PLATELET
Abs Immature Granulocytes: 0.08 10*3/uL — ABNORMAL HIGH (ref 0.00–0.07)
Basophils Absolute: 0 10*3/uL (ref 0.0–0.1)
Basophils Relative: 0 %
Eosinophils Absolute: 0.2 10*3/uL (ref 0.0–0.5)
Eosinophils Relative: 3 %
HCT: 32.2 % — ABNORMAL LOW (ref 39.0–52.0)
Hemoglobin: 10.1 g/dL — ABNORMAL LOW (ref 13.0–17.0)
Immature Granulocytes: 2 %
Lymphocytes Relative: 49 %
Lymphs Abs: 2.7 10*3/uL (ref 0.7–4.0)
MCH: 32.7 pg (ref 26.0–34.0)
MCHC: 31.4 g/dL (ref 30.0–36.0)
MCV: 104.2 fL — ABNORMAL HIGH (ref 80.0–100.0)
Monocytes Absolute: 1.1 10*3/uL — ABNORMAL HIGH (ref 0.1–1.0)
Monocytes Relative: 19 %
Neutro Abs: 1.5 10*3/uL — ABNORMAL LOW (ref 1.7–7.7)
Neutrophils Relative %: 27 %
Platelets: 199 10*3/uL (ref 150–400)
RBC: 3.09 MIL/uL — ABNORMAL LOW (ref 4.22–5.81)
RDW: 21.4 % — ABNORMAL HIGH (ref 11.5–15.5)
WBC: 5.5 10*3/uL (ref 4.0–10.5)
nRBC: 0.4 % — ABNORMAL HIGH (ref 0.0–0.2)

## 2023-06-21 LAB — COMPREHENSIVE METABOLIC PANEL
ALT: 18 U/L (ref 0–44)
AST: 20 U/L (ref 15–41)
Albumin: 3.5 g/dL (ref 3.5–5.0)
Alkaline Phosphatase: 106 U/L (ref 38–126)
Anion gap: 9 (ref 5–15)
BUN: 23 mg/dL (ref 8–23)
CO2: 20 mmol/L — ABNORMAL LOW (ref 22–32)
Calcium: 8.6 mg/dL — ABNORMAL LOW (ref 8.9–10.3)
Chloride: 105 mmol/L (ref 98–111)
Creatinine, Ser: 0.99 mg/dL (ref 0.61–1.24)
GFR, Estimated: >60 mL/min (ref >60)
Glucose, Bld: 102 mg/dL — ABNORMAL HIGH (ref 70–99)
Potassium: 3.8 mmol/L (ref 3.5–5.1)
Sodium: 134 mmol/L — ABNORMAL LOW (ref 135–145)
Total Bilirubin: 0.7 mg/dL (ref 0.3–1.2)
Total Protein: 7 g/dL (ref 6.5–8.1)

## 2023-06-21 LAB — FERRITIN: Ferritin: 759 ng/mL — ABNORMAL HIGH (ref 24–336)

## 2023-06-21 LAB — MAGNESIUM: Magnesium: 2 mg/dL (ref 1.7–2.4)

## 2023-06-21 LAB — IRON AND TIBC
Iron: 39 ug/dL — ABNORMAL LOW (ref 45–182)
Saturation Ratios: 18 % (ref 17.9–39.5)
TIBC: 221 ug/dL — ABNORMAL LOW (ref 250–450)
UIBC: 182 ug/dL

## 2023-06-21 MED ORDER — SODIUM CHLORIDE 0.9% FLUSH
10.0000 mL | INTRAVENOUS | Status: DC | PRN
  Administered 2023-06-21: 10 mL via INTRAVENOUS

## 2023-06-21 MED ORDER — HEPARIN SOD (PORK) LOCK FLUSH 100 UNIT/ML IV SOLN
500.0000 [IU] | Freq: Once | INTRAVENOUS | Status: AC
  Administered 2023-06-21: 500 [IU] via INTRAVENOUS

## 2023-06-21 MED ORDER — CABOZANTINIB S-MALATE 40 MG PO TABS
40.0000 mg | ORAL_TABLET | Freq: Every day | ORAL | 1 refills | Status: DC
  Filled 2023-06-22: qty 30, 30d supply, fill #0
  Filled 2023-07-13: qty 30, 30d supply, fill #1

## 2023-06-21 NOTE — Telephone Encounter (Signed)
Oral Oncology Patient Advocate Encounter  Prior Authorization for Cabometyx has been approved.    PA# [id number]  Effective dates: 09/14/22 through 09/13/24  Patients co-pay is $0.00.    Ardeen Fillers, CPhT Oncology Pharmacy Patient Advocate  [facility]  [phone] (phone) [phone] (fax) 06/21/2023 3:36 PM

## 2023-06-21 NOTE — Progress Notes (Signed)
Patients port flushed without difficulty.  Good blood return noted with no bruising or swelling noted at site.  Band aid applied.  VSS with discharge and left in satisfactory condition with no s/s of distress noted.   

## 2023-06-21 NOTE — Progress Notes (Signed)
[facility] [address], [place] [address]    Clinic Day:  06/21/2023  Referring physician: Assunta Found, MD  Patient Care Team: Assunta Found, MD as PCP - General (Family Medicine) Doreatha Massed, MD as Medical Oncologist (Medical Oncology) Therese Sarah, RN as Oncology Nurse Navigator (Medical Oncology)   ASSESSMENT & PLAN:   Assessment: 1.  Metastatic left kidney clear-cell RCC: - Presentation with left shoulder pain.  Weight loss of 50 pounds in the last 6 months but was trying to lose weight by cutting back on eating and doing some walking. - Left robotic assisted laparoscopic radical nephrectomy by Dr. Ronne Binning on 08/13/2022 - Pathology: 7 cm clear-cell RCC with focal sarcomatoid and rhabdoid features, nuclear grade 4.  Tumor extends into the renal vein and renal sinus fat (PT3a).  Ureteral, vascular and all margins negative for tumor. - PET scan (12/24/2022): Enlarging hypermetabolic left adrenal metastasis.  2 small hypermetabolic soft tissue nodules in the left nephrectomy bed.  Hypermetabolic lytic lesions involving distal left clavicle and left temporal bone.  Indeterminate small hypermetabolic parotid nodules bilaterally. - IMDC: At least intermediate pending labs - Lenvatinib 14 mg daily started on 01/18/2023, Keytruda on 01/22/2023, dose decreased to 10 mg on 01/28/2023 due to elevated blood pressure   2.  Social/family history: - Lives by himself and is independent of ADLs and IADLs.  He does not drive.  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  Current active smoker, half pack per day, started at age 23. - Mother might have had cancer, he is not sure.    Plan: 1.  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  He reports that left shoulder pain is starting to come back. - PET scan on 05/06/2023: Mixed response with improvement in prior metastasis but new nodal/bone  lesions. - We talked about second line therapy with cabozantinib now that the skin lesions have improved. - We will start him at low dose of cabozantinib 40 mg daily.  We discussed side effects in detail. - We will send prescription to specialty pharmacy.  He will start taking it once he receives the shipment.  RTC 3 weeks for follow-up.   2.  Hypertension: - Lisinopril and amlodipine are on hold.  Blood pressure was 140/60.  Will closely monitor.  We may have to start them back once he started on cabozantinib.   3.  Epidermal necrolysis: - Generalized rash and skin peeling has completely improved. - Has some skin peeling in the bottom of the soles without any blistering. - Also has 1 raw denuded area in the left waist laterally.  He is continuing to apply steroid cream.    No orders of the defined types were placed in this encounter.      Doreatha Massed, MD   10/7/20242:53 PM  CHIEF COMPLAINT:   Diagnosis: metastatic clear cell renal cell carcinoma    Cancer Staging  Clear cell renal cell carcinoma, left (HCC) Staging form: Kidney, AJCC 8th Edition - Clinical stage from 01/12/2023: Stage IV (cT3a, cNX, cM1) - Unsigned    Prior Therapy: left nephrectomy 08/13/22   Current Therapy:  Lenvatinib and pembrolizumab    HISTORY OF PRESENT ILLNESS:   Oncology History  Clear cell renal cell carcinoma, left (HCC)  01/12/2023 Initial Diagnosis   Clear cell renal cell carcinoma, left (HCC)   01/22/2023 -  Chemotherapy   Patient is on Treatment Plan :  KIDNEY Pembrolizumab (200) q21d        INTERVAL HISTORY:   David [name] is a 72 y.o. male seen for follow-up of metastatic clear-cell renal cell carcinoma.  He reports that his skin rash has improved.  However skin peeling on the bottom of the feet.  Appetite is 70%.  Energy levels are 50%.  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 Ronal Fear note   Pneumonia    Seizure [facility])     Surgical History: Past Surgical History:  Procedure Laterality Date   COLONOSCOPY  05/31/2012   Procedure: COLONOSCOPY;  Surgeon: Dalia Heading, MD;  Location: [facility] ENDO [facility]ITE;  Service: Gastroenterology;  Laterality: N/A;   PORTACATH PLACEMENT Right 01/27/2023   Procedure: INSERTION PORT-A-CATH, RIJ;  Surgeon: Lewie Chamber, DO;  Location: [facility] ORS;  Service: General;  Laterality: Right;   ROBOT ASSISTED LAPAROSCOPIC NEPHRECTOMY Left 08/13/2022   Procedure: XI ROBOTIC ASSISTED LAPAROSCOPIC NEPHRECTOMY;  Surgeon: Malen Gauze, MD;  Location: [facility] ORS;  Service: Urology;  Laterality: Left;   TONSILLECTOMY      Social History: Social History   Socioeconomic History   Marital status: Divorced    Spouse name: Not on file   Number of children: Not on file   Years of education: Not on file   Highest education level: Not on file  Occupational History   Not on file  Tobacco Use   Smoking status: Every Day    Current packs/day: 0.50    Average packs/day: 0.5 packs/day for 65.0 years (32.5 ttl pk-yrs)    Types: Cigarettes   Smokeless tobacco: Never  Vaping Use   Vaping status: Never Used  Substance and Sexual Activity   Alcohol use: No   Drug use: No   Sexual activity: Not Currently  Other Topics Concern   Not on file  Social History Narrative   Not on file   Social Determinants of Health   Financial Resource Strain: Low Risk  (03/24/2023)   Received from [facility]   Overall Financial Resource Strain (CARDIA)    Difficulty of Paying Living Expenses: Not hard at all  Recent Concern: Financial Resource Strain - High Risk (02/01/2023)   Overall Financial Resource Strain (CARDIA)    Difficulty of Paying Living Expenses: Very hard  Food Insecurity: No Food Insecurity (03/24/2023)   Received from [facility]   Hunger Vital Sign     Worried About Running Out of Food in the Last Year: Never true    Ran Out of Food in the Last Year: Never true  Transportation Needs: No Transportation Needs (03/24/2023)   Received from [facility] - Transportation    Lack of Transportation (Medical): No    Lack of Transportation (Non-Medical): No  Physical Activity: Not on file  Stress: Patient Declined (03/07/2023)   Received from [facility], [facility] - Occupational Stress Questionnaire    Feeling of Stress : Patient declined  Social Connections: Unknown (03/07/2023)   Received from [facility], [facility]   Social Network    Social Network: Not on file  Intimate Partner Violence: Not At Risk (03/08/2023)   Received from [facility], [facility]   HITS    Over the last 12 months  how often did your partner physically hurt you?: 1    Over the last 12 months how often did your partner insult you or talk down to you?: 1    Over the last 12 months how often did your partner threaten you with physical harm?: 1    Over the last 12 months how often did your partner scream or curse at you?: 1    Family History: No family history on file.  Current Medications:  Current Outpatient Medications:    amLODipine (NORVASC) 10 MG tablet, Take 1 tablet (10 mg total) by mouth daily., Disp: 30 tablet, Rfl: 6   cephALEXin (KEFLEX) 500 MG capsule, Take 500 mg by mouth 3 (three) times daily., Disp: , Rfl:    Clobetasol Prop Emollient Base (CLOBETASOL PROPIONATE E) 0.05 % emollient cream, Apply 1 Application topically 2 (two) times daily., Disp: 30 g, Rfl: 0   levETIRAcetam (KEPPRA) 500 MG tablet, Take by mouth., Disp: , Rfl:    lidocaine (XYLOCAINE) 5 % ointment, Apply 1 Application topically 2 (two) times daily as needed for moderate pain., Disp: 35.44 g, Rfl: 0   lidocaine-prilocaine (EMLA) cream, Apply to affected area once, Disp: 30 g, Rfl: 3   Pembrolizumab (KEYTRUDA IV),  Inject into the vein every 21 ( twenty-one) days., Disp: , Rfl:    prochlorperazine (COMPAZINE) 10 MG tablet, Take 1 tablet (10 mg total) by mouth every 6 (six) hours as needed for nausea or vomiting., Disp: 30 tablet, Rfl: 1   tamsulosin (FLOMAX) 0.4 MG CAPS capsule, Take 0.4 mg by mouth daily. , Disp: , Rfl:    triamcinolone cream (KENALOG) 0.1 %, SMARTSIG:1 Application Topical 2-3 Times Daily, Disp: , Rfl:    lisinopril (ZESTRIL) 10 MG tablet, Take by mouth., Disp: , Rfl:    Allergies: No Known Allergies  REVIEW OF SYSTEMS:   Review of Systems  Constitutional:  Negative for chills, fatigue and fever.  HENT:   Negative for lump/mass, mouth sores, nosebleeds and sore throat.   Eyes:  Negative for eye problems.  Respiratory:  Positive for shortness of breath. Negative for cough.   Cardiovascular:  Negative for chest pain, leg swelling and palpitations.  Gastrointestinal:  Negative for abdominal pain, constipation, diarrhea, nausea and vomiting.  Genitourinary:  Negative for bladder incontinence, difficulty urinating, dysuria, frequency, hematuria and nocturia.   Musculoskeletal:  Positive for arthralgias (left shoulder/back). Negative for back pain, flank pain, myalgias and neck pain.  Neurological:  Positive for numbness. Negative for dizziness and headaches.  Hematological:  Does not bruise/bleed easily.  Psychiatric/Behavioral:  Negative for depression and suicidal ideas. The patient is not nervous/anxious.   All other systems reviewed and are negative.    VITALS:   There were no vitals taken for this visit.  Wt Readings from Last 3 Encounters:  06/21/23 220 lb (99.8 kg)  05/12/23 235 lb 6.4 oz (106.8 kg)  04/20/23 232 lb (105.2 kg)    There is no height or weight on file to calculate BMI.  Performance status (ECOG): 1 - Symptomatic but completely ambulatory  PHYSICAL EXAM:   Physical Exam Vitals and nursing note reviewed. Exam conducted with a chaperone present.   Constitutional:      Appearance: Normal appearance.  Cardiovascular:     Rate and Rhythm: Normal rate and regular rhythm.     Pulses: Normal pulses.     Heart sounds: Normal heart sounds.  Pulmonary:     Effort: Pulmonary effort is normal.     Breath  sounds: Normal breath sounds.  Abdominal:     Palpations: Abdomen is soft. There is no hepatomegaly, splenomegaly or mass.     Tenderness: There is no abdominal tenderness.  Musculoskeletal:     Right lower leg: No edema.     Left lower leg: No edema.  Lymphadenopathy:     Cervical: No cervical adenopathy.     Right cervical: No superficial, deep or posterior cervical adenopathy.    Left cervical: No superficial, deep or posterior cervical adenopathy.     Upper Body:     Right upper body: No supraclavicular or axillary adenopathy.     Left upper body: No supraclavicular or axillary adenopathy.  Neurological:     General: No focal deficit present.     Mental Status: He is alert and oriented to person, place, and time.  Psychiatric:        Mood and Affect: Mood normal.        Behavior: Behavior normal.     LABS:      Latest Ref Rng & Units 06/21/2023    2:34 PM 05/12/2023    9:38 AM 05/10/2023   12:55 PM  CBC  WBC 4.0 - 10.5 K/uL 5.5  4.5  7.7   Hemoglobin 13.0 - 17.0 g/dL 93.7  8.5  9.1   Hematocrit 39.0 - 52.0 % 32.2  25.6  27.7   Platelets 150 - 400 K/uL 199  217  237       Latest Ref Rng & Units 05/12/2023    9:38 AM 05/10/2023   12:55 PM 04/20/2023    9:50 AM  CMP  Glucose 70 - 99 mg/dL 169  678  938   BUN 8 - 23 mg/dL 33  35  27   Creatinine 0.61 - 1.24 mg/dL 1.01  7.51  0.25   Sodium 135 - 145 mmol/L 140  141  132   Potassium 3.5 - 5.1 mmol/L 4.1  4.0  3.9   Chloride 98 - 111 mmol/L 113  113  106   CO2 22 - 32 mmol/L 17  18  17    Calcium 8.9 - 10.3 mg/dL 8.3  8.1  8.4   Total Protein 6.5 - 8.1 g/dL 6.7  6.2  6.6   Total Bilirubin 0.3 - 1.2 mg/dL 0.7  0.9  0.7   Alkaline Phos 38 - 126 U/L 66  67  122   AST 15 -  41 U/L 28  34  48   ALT 0 - 44 U/L 38  35  45      No results found for: "CEA1", "CEA" / No results found for: "CEA1", "CEA" No results found for: "PSA1" No results found for: "ENI778" No results found for: "CAN125"  No results found for: "TOTALPROTELP", "ALBUMINELP", "A1GS", "A2GS", "BETS", "BETA2SER", "GAMS", "MSPIKE", "SPEI" Lab Results  Component Value Date   TIBC 250 01/20/2023   TIBC 271 04/03/2009   FERRITIN 655 (H) 01/20/2023   FERRITIN 870 (H) 04/03/2009   IRONPCTSAT 26 01/20/2023   IRONPCTSAT 72 (H) 04/03/2009   Lab Results  Component Value Date   LDH 248 (H) 01/20/2023     STUDIES:   No results found.

## 2023-06-21 NOTE — Telephone Encounter (Signed)
Clinical Pharmacist Practitioner Encounter   Received new prescription for Cabometyx (cabozantinib) for the treatment of metastatic clear cell RCC, planned duration until disease progression or unacceptable drug toxicity.  CMP from 06/21/23 assessed, no relevant lab abnormalities. Prescription dose and frequency assessed. MD plans to dose increase if tolerated.   Current medication list in Epic reviewed, no DDIs with cabozantinib identified.   Evaluated chart and no patient barriers to medication adherence identified.   Prescription has been e-scribed to the [facility] for benefits analysis and approval.  Oral Oncology Clinic will continue to follow for insurance authorization, copayment issues, initial counseling and start date.   Remi Haggard, PharmD, BCPS, BCOP, CPP Hematology/Oncology Clinical Pharmacist Practitioner [place] [phone]  06/21/2023 3:22 PM

## 2023-06-21 NOTE — Patient Instructions (Addendum)
[facility]  Discharge Instructions  You were seen and examined today by Dr. Ellin Saba.  Dr. Ellin Saba discussed your most recent lab work which revealed that everything looks stable.  Dr. Ellin Saba discussed switching to a new pill cabozantinib.   Follow-up as scheduled in 3 weeks.    Thank you for choosing [name] to provide your oncology and hematology care.   To afford each patient quality time with our provider, please arrive at least 15 minutes before your scheduled appointment time. You may need to reschedule your appointment if you arrive late (10 or more minutes). Arriving late affects you and other patients whose appointments are after yours.  Also, if you miss three or more appointments without notifying the office, you may be dismissed from the clinic at the provider's discretion.    Again, thank you for choosing [facility].  Our hope is that these requests will decrease the amount of time that you wait before being seen by our physicians.   If you have a lab appointment with the Cancer Center - please note that after April 8th, all labs will be drawn in the cancer center.  You do not have to check in or register with the main entrance as you have in the past but will complete your check-in at the cancer center.            _____________________________________________________________  Should you have questions after your visit to [facility], please contact our office at [phone] and follow the prompts.  Our office hours are 8:00 a.m. to 4:30 p.m. Monday - Thursday and 8:00 a.m. to 2:30 p.m. Friday.  Please note that voicemails left after 4:00 p.m. may not be returned until the following business day.  We are closed weekends and all major holidays.  You do have access to a nurse 24-7, just call the main number to the clinic [phone] and do not press any options, hold on the line and a  nurse will answer the phone.    For prescription refill requests, have your pharmacy contact our office and allow 72 hours.    Masks are no longer required in the cancer centers. If you would like for your care team to wear a mask while they are taking care of you, please let them know. You may have one support person who is at least 72 years old accompany you for your appointments.

## 2023-06-21 NOTE — Telephone Encounter (Signed)
Oral Oncology Patient Advocate Encounter  New authorization   Received notification that prior authorization for Cabometyx is required.   PA submitted on 06/21/23  Key [id number]  Status is pending     Ardeen Fillers, CPhT Oncology Pharmacy Patient Advocate  [facility]  [phone] (phone) [phone] (fax) 06/21/2023 3:33 PM

## 2023-06-22 ENCOUNTER — Other Ambulatory Visit: Payer: Self-pay

## 2023-06-22 NOTE — Telephone Encounter (Signed)
Patient successfully OnBoarded and drug education provided by pharmacist. Medication scheduled to be shipped on Wednesday, 06/23/23 for delivery on Thursday, 06/24/23 from [facility] to patient's address. Patient also knows to call me at [phone] with any questions or concerns regarding receiving medication or if there is any unexpected change in co-pay.   Ardeen Fillers, CPhT Oncology Pharmacy Patient Advocate  [facility]  [phone] (phone) [phone] (fax) 06/22/2023 3:50 PM

## 2023-06-22 NOTE — Progress Notes (Signed)
Patient education documented in EPIC note on 06/22/23.

## 2023-06-22 NOTE — Progress Notes (Signed)
Specialty Pharmacy Initial Fill Coordination Note  David [name] is a 72 y.o. male contacted today regarding refills of specialty medication(s) Cabozantinib S-Malate (Antineoplastic Enzyme Inhibitors)   Patient requested Delivery   Delivery date: 06/24/23   Verified address: [address], [place], [place] [address]   Medication will be filled on 06/23/23.   Patient is aware of $0.00 copayment.

## 2023-06-22 NOTE — Telephone Encounter (Signed)
Clinical Pharmacist Practitioner Encounter   [facility] (Specialty) to deliver medication to patient on 06/24/23. He will start when he has medication in hand.  Patient Education I spoke with patient for overview of new oral chemotherapy medication: Cabometyx (cabozantinib) for the treatment of metastatic clear cell RCC, planned duration until disease progression or unacceptable drug toxicity.   Counseled patient on administration, dosing, side effects, monitoring, drug-food interactions, safe handling, storage, and disposal. Patient will take 1 tablet (40 mg total) by mouth daily. Take on an empty stomach, 1 hour before or 2 hours after meals.   Side effects include but not limited to: diarrhea, hand-foot syndrome, fatigue, nausea, mouth sores, hypertension. Diarrhea: recommended the use loperamide, He knows to call the office if he is having 4 or more loose stools per day Hand-foot syndrome: recommended the use of Udderly Smooth Extra Care 20 Hypertension: reviewed s/sx of hypertension with patient Mouth sores: patient knows to request magic mouthwash if needed   Reviewed with patient importance of keeping a medication schedule and plan for any missed doses.  After discussion with patient no patient barriers to medication adherence identified.   Mr. Mayberry voiced understanding and appreciation. All questions answered. Medication handout provided.  Provided patient with Oral Chemotherapy Navigation Clinic phone number. Patient knows to call the office with questions or concerns. Oral Chemotherapy Navigation Clinic will continue to follow.  Remi Haggard, PharmD, BCPS, BCOP, CPP Hematology/Oncology Clinical Pharmacist Practitioner [place] [phone]  06/22/2023 3:43 PM

## 2023-07-12 ENCOUNTER — Inpatient Hospital Stay: Payer: Medicare PPO

## 2023-07-12 ENCOUNTER — Inpatient Hospital Stay (HOSPITAL_BASED_OUTPATIENT_CLINIC_OR_DEPARTMENT_OTHER): Payer: Medicare PPO | Admitting: Hematology

## 2023-07-12 DIAGNOSIS — I129 Hypertensive chronic kidney disease with stage 1 through stage 4 chronic kidney disease, or unspecified chronic kidney disease: Secondary | ICD-10-CM | POA: Diagnosis not present

## 2023-07-12 DIAGNOSIS — C7972 Secondary malignant neoplasm of left adrenal gland: Secondary | ICD-10-CM | POA: Diagnosis not present

## 2023-07-12 DIAGNOSIS — Z452 Encounter for adjustment and management of vascular access device: Secondary | ICD-10-CM | POA: Diagnosis not present

## 2023-07-12 DIAGNOSIS — C642 Malignant neoplasm of left kidney, except renal pelvis: Secondary | ICD-10-CM

## 2023-07-12 DIAGNOSIS — N189 Chronic kidney disease, unspecified: Secondary | ICD-10-CM | POA: Diagnosis not present

## 2023-07-12 LAB — CBC WITH DIFFERENTIAL/PLATELET
Abs Immature Granulocytes: 0.03 10*3/uL (ref 0.00–0.07)
Basophils Absolute: 0 10*3/uL (ref 0.0–0.1)
Basophils Relative: 0 %
Eosinophils Absolute: 0.2 10*3/uL (ref 0.0–0.5)
Eosinophils Relative: 4 %
HCT: 36.7 % — ABNORMAL LOW (ref 39.0–52.0)
Hemoglobin: 12 g/dL — ABNORMAL LOW (ref 13.0–17.0)
Immature Granulocytes: 1 %
Lymphocytes Relative: 62 %
Lymphs Abs: 3.6 10*3/uL (ref 0.7–4.0)
MCH: 33 pg (ref 26.0–34.0)
MCHC: 32.7 g/dL (ref 30.0–36.0)
MCV: 100.8 fL — ABNORMAL HIGH (ref 80.0–100.0)
Monocytes Absolute: 0.8 10*3/uL (ref 0.1–1.0)
Monocytes Relative: 14 %
Neutro Abs: 1.1 10*3/uL — ABNORMAL LOW (ref 1.7–7.7)
Neutrophils Relative %: 19 %
Platelets: 167 10*3/uL (ref 150–400)
RBC: 3.64 MIL/uL — ABNORMAL LOW (ref 4.22–5.81)
RDW: 21.7 % — ABNORMAL HIGH (ref 11.5–15.5)
WBC: 5.8 10*3/uL (ref 4.0–10.5)
nRBC: 0 % (ref 0.0–0.2)

## 2023-07-12 LAB — COMPREHENSIVE METABOLIC PANEL
ALT: 29 U/L (ref 0–44)
AST: 35 U/L (ref 15–41)
Albumin: 3.5 g/dL (ref 3.5–5.0)
Alkaline Phosphatase: 122 U/L (ref 38–126)
Anion gap: 10 (ref 5–15)
BUN: 18 mg/dL (ref 8–23)
CO2: 22 mmol/L (ref 22–32)
Calcium: 8.6 mg/dL — ABNORMAL LOW (ref 8.9–10.3)
Chloride: 102 mmol/L (ref 98–111)
Creatinine, Ser: 1.11 mg/dL (ref 0.61–1.24)
GFR, Estimated: >60 mL/min (ref >60)
Glucose, Bld: 107 mg/dL — ABNORMAL HIGH (ref 70–99)
Potassium: 4.2 mmol/L (ref 3.5–5.1)
Sodium: 134 mmol/L — ABNORMAL LOW (ref 135–145)
Total Bilirubin: 0.5 mg/dL (ref 0.3–1.2)
Total Protein: 6.7 g/dL (ref 6.5–8.1)

## 2023-07-12 LAB — MAGNESIUM: Magnesium: 2 mg/dL (ref 1.7–2.4)

## 2023-07-12 MED ORDER — HEPARIN SOD (PORK) LOCK FLUSH 100 UNIT/ML IV SOLN
500.0000 [IU] | Freq: Once | INTRAVENOUS | Status: AC
Start: 2023-07-12 — End: 2023-07-12
  Administered 2023-07-12: 500 [IU] via INTRAVENOUS

## 2023-07-12 MED ORDER — SODIUM CHLORIDE 0.9% FLUSH
10.0000 mL | Freq: Once | INTRAVENOUS | Status: AC
  Administered 2023-07-12: 10 mL via INTRAVENOUS

## 2023-07-12 MED ORDER — AMLODIPINE BESYLATE 10 MG PO TABS: 10.0000 mg | ORAL_TABLET | Freq: Every day | ORAL | 6 refills | Status: DC

## 2023-07-12 NOTE — Progress Notes (Signed)
[facility] [address], [place] [address]    Clinic Day:  07/12/2023  Referring physician: Assunta Found, MD  Patient Care Team: Assunta Found, MD as PCP - General (Family Medicine) Doreatha Massed, MD as Medical Oncologist (Medical Oncology) Therese Sarah, RN as Oncology Nurse Navigator (Medical Oncology)   ASSESSMENT & PLAN:   Assessment: 1.  Metastatic left kidney clear-cell RCC: - Presentation with left shoulder pain.  Weight loss of 50 pounds in the last 6 months but was trying to lose weight by cutting back on eating and doing some walking. - Left robotic assisted laparoscopic radical nephrectomy by Dr. Ronne Binning on 08/13/2022 - Pathology: 7 cm clear-cell RCC with focal sarcomatoid and rhabdoid features, nuclear grade 4.  Tumor extends into the renal vein and renal sinus fat (PT3a).  Ureteral, vascular and all margins negative for tumor. - PET scan (12/24/2022): Enlarging hypermetabolic left adrenal metastasis.  2 small hypermetabolic soft tissue nodules in the left nephrectomy bed.  Hypermetabolic lytic lesions involving distal left clavicle and left temporal bone.  Indeterminate small hypermetabolic parotid nodules bilaterally. - IMDC: At least intermediate pending labs - Lenvatinib 14 mg daily started on 01/18/2023, Keytruda on 01/22/2023, dose decreased to 10 mg on 01/28/2023 due to elevated blood pressure, discontinued due to progression on PET scan on 05/06/2023 - Cabozantinib 40 mg daily started on 06/24/2023   2.  Social/family history: - Lives by himself and is independent of ADLs and IADLs.  He does not drive.  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  Current active smoker, half pack per day, started at age 30. - Mother might have had cancer, he is not sure.    Plan: 1.  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 - He started cabozantinib 40 mg daily on 06/24/2023. - Reports left shoulder pain and back pain but does not require any pain medication. - Reviewed labs today: Normal LFTs and creatinine.  Electrolytes are normal.  CBC grossly normal with hemoglobin improved to 12. - Continue cabozantinib 40 mg daily.  RTC 6 weeks for follow-up with repeat labs.   2.  Hypertension: - Lisinopril and amlodipine are on hold.  Blood pressure today is 157/82. - Recommend starting back on amlodipine 10 mg daily.   3.  Epidermal necrolysis: - Generalized rash and skin peeling is resolved.  He has a ulcer along the left lateral waistline and 2 shallow ulcers in the upper part of gluteal cleft. - Recommend using barrier cream.  Recommend evaluation by wound care nurse.    Orders Placed This Encounter  Procedures   CBC with Differential    Standing Status:   Future    Standing Expiration Date:   07/11/2024   Comprehensive metabolic panel    Standing Status:   Future    Standing Expiration Date:   07/11/2024   Magnesium    Standing Status:   Future    Standing Expiration Date:   07/11/2024      I,Katie Daubenspeck,acting as a scribe for Doreatha Massed, MD.,have documented all relevant documentation on the behalf of Doreatha Massed, MD,as directed by  Doreatha Massed, MD while in the presence of Doreatha Massed, MD.   I, Doreatha Massed MD, have reviewed the above documentation for accuracy and completeness, and I agree with the above.   Doreatha Massed, MD  10/28/202412:48 PM  CHIEF COMPLAINT:   Diagnosis: metastatic clear cell renal cell carcinoma    Cancer Staging  Clear cell renal cell carcinoma, left (HCC) Staging form: Kidney, AJCC 8th Edition - Clinical stage from 01/12/2023: Stage IV (cT3a, cNX, cM1) - Unsigned    Prior Therapy:  1. left nephrectomy 08/13/22  2. Lenvatinib and pembrolizumab, 01/18/23 - 06/21/23  Current  Therapy:  cabozantinib    HISTORY OF PRESENT ILLNESS:   Oncology History  Clear cell renal cell carcinoma, left (HCC)  01/12/2023 Initial Diagnosis   Clear cell renal cell carcinoma, left (HCC)   01/22/2023 -  Chemotherapy   Patient is on Treatment Plan : KIDNEY Pembrolizumab (200) q21d        INTERVAL HISTORY:   David [name] is a 72 y.o. male presenting to clinic today for follow up of metastatic clear cell renal cell carcinoma. He was last seen by me on 06/21/23.  Today, he states that he is doing well overall. His appetite level is at 100%. His energy level is at 60%.  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 Ronal Fear note   Pneumonia    Seizure [facility])     Surgical History: Past Surgical History:  Procedure Laterality Date   COLONOSCOPY  05/31/2012   Procedure: COLONOSCOPY;  Surgeon: Dalia Heading, MD;  Location: [facility] ENDO SUITE;  Service: Gastroenterology;  Laterality: N/A;   PORTACATH PLACEMENT Right 01/27/2023   Procedure: INSERTION PORT-A-CATH, RIJ;  Surgeon: Lewie Chamber, DO;  Location: [facility] ORS;  Service: General;  Laterality: Right;   ROBOT ASSISTED LAPAROSCOPIC NEPHRECTOMY Left 08/13/2022   Procedure: XI ROBOTIC ASSISTED LAPAROSCOPIC NEPHRECTOMY;  Surgeon: Malen Gauze, MD;  Location: [facility] ORS;  Service: Urology;  Laterality: Left;   TONSILLECTOMY      Social History: Social History   Socioeconomic History   Marital status: Divorced    Spouse name: Not on file   Number of children: Not on file   Years of education: Not on file   Highest education level: Not on file  Occupational History   Not on file  Tobacco Use   Smoking status: Every Day    Current packs/day: 0.50    Average packs/day: 0.5 packs/day for 65.0 years (32.5 ttl pk-yrs)    Types: Cigarettes    Smokeless tobacco: Never  Vaping Use   Vaping status: Never Used  Substance and Sexual Activity   Alcohol use: No   Drug use: No   Sexual activity: Not Currently  Other Topics Concern   Not on file  Social History Narrative   Not on file   Social Determinants of Health   Financial Resource Strain: Low Risk  (03/24/2023)   Received from [facility]   Overall Financial Resource Strain (CARDIA)    Difficulty of Paying Living Expenses: Not hard at all  Recent Concern: Financial Resource Strain - High Risk (02/01/2023)   Overall Financial Resource Strain (CARDIA)    Difficulty of Paying Living Expenses: Very hard  Food Insecurity: No Food Insecurity (03/24/2023)   Received from [facility]   Hunger Vital Sign    Worried About Running Out of Food in the Last Year: Never true    Ran Out of Food in the Last Year: Never true  Transportation Needs: No  Transportation Needs (03/24/2023)   Received from [facility] - Transportation    Lack of Transportation (Medical): No    Lack of Transportation (Non-Medical): No  Physical Activity: Not on file  Stress: Patient Declined (03/07/2023)   Received from [facility], [facility] - Occupational Stress Questionnaire    Feeling of Stress : Patient declined  Social Connections: Unknown (03/07/2023)   Received from [facility], [facility]   Social Network    Social Network: Not on file  Intimate Partner Violence: Not At Risk (03/08/2023)   Received from [facility], [facility]   HITS    Over the last 12 months how often did your partner physically hurt you?: 1    Over the last 12 months how often did your partner insult you or talk down to you?: 1    Over the last 12 months how often did your partner threaten you with physical harm?: 1    Over the last 12 months how often did your partner scream or curse at you?: 1    Family History: No family history on file.  Current  Medications:  Current Outpatient Medications:    cabozantinib (CABOMETYX) 40 MG tablet, Take 1 tablet (40 mg total) by mouth daily. Take on an empty stomach, 1 hour before or 2 hours after meals., Disp: 30 tablet, Rfl: 1   Clobetasol Prop Emollient Base (CLOBETASOL PROPIONATE E) 0.05 % emollient cream, Apply 1 Application topically 2 (two) times daily., Disp: 30 g, Rfl: 0   levETIRAcetam (KEPPRA) 500 MG tablet, Take by mouth., Disp: , Rfl:    lidocaine (XYLOCAINE) 5 % ointment, Apply 1 Application topically 2 (two) times daily as needed for moderate pain., Disp: 35.44 g, Rfl: 0   lidocaine-prilocaine (EMLA) cream, Apply to affected area once, Disp: 30 g, Rfl: 3   Pembrolizumab (KEYTRUDA IV), Inject into the vein every 21 ( twenty-one) days., Disp: , Rfl:    tamsulosin (FLOMAX) 0.4 MG CAPS capsule, Take 0.4 mg by mouth daily. , Disp: , Rfl:    triamcinolone cream (KENALOG) 0.1 %, SMARTSIG:1 Application Topical 2-3 Times Daily, Disp: , Rfl:    amLODipine (NORVASC) 10 MG tablet, Take 1 tablet (10 mg total) by mouth daily., Disp: 30 tablet, Rfl: 6   lisinopril (ZESTRIL) 10 MG tablet, Take by mouth., Disp: , Rfl:    prochlorperazine (COMPAZINE) 10 MG tablet, Take 1 tablet (10 mg total) by mouth every 6 (six) hours as needed for nausea or vomiting. (Patient not taking: Reported on 07/12/2023), Disp: 30 tablet, Rfl: 1   Allergies: No Known Allergies  REVIEW OF SYSTEMS:   Review of Systems  Constitutional:  Negative for chills, fatigue and fever.  HENT:   Negative for lump/mass, mouth sores, nosebleeds, sore throat and trouble swallowing.   Eyes:  Negative for eye problems.  Respiratory:  Positive for shortness of breath. Negative for cough.   Cardiovascular:  Negative for chest pain, leg swelling and palpitations.  Gastrointestinal:  Negative for abdominal pain, constipation, diarrhea, nausea and vomiting.  Genitourinary:  Negative for bladder incontinence, difficulty urinating, dysuria,  frequency, hematuria and nocturia.   Musculoskeletal:  Negative for arthralgias, back pain, flank pain, myalgias and neck pain.  Skin:  Negative for itching and rash.  Neurological:  Positive for numbness. Negative for dizziness and headaches.  Hematological:  Does not bruise/bleed easily.  Psychiatric/Behavioral:  Negative for depression, sleep disturbance and suicidal ideas. The patient is not  nervous/anxious.   All other systems reviewed and are negative.    VITALS:   There were no vitals taken for this visit.  Wt Readings from Last 3 Encounters:  07/12/23 219 lb 9.6 oz (99.6 kg)  06/21/23 220 lb (99.8 kg)  05/12/23 235 lb 6.4 oz (106.8 kg)    There is no height or weight on file to calculate BMI.  Performance status (ECOG): 1 - Symptomatic but completely ambulatory  PHYSICAL EXAM:   Physical Exam Vitals and nursing note reviewed. Exam conducted with a chaperone present.  Constitutional:      Appearance: Normal appearance.  Cardiovascular:     Rate and Rhythm: Normal rate and regular rhythm.     Pulses: Normal pulses.     Heart sounds: Normal heart sounds.  Pulmonary:     Effort: Pulmonary effort is normal.     Breath sounds: Normal breath sounds.  Abdominal:     Palpations: Abdomen is soft. There is no hepatomegaly, splenomegaly or mass.     Tenderness: There is no abdominal tenderness.  Musculoskeletal:     Right lower leg: No edema.     Left lower leg: No edema.  Lymphadenopathy:     Cervical: No cervical adenopathy.     Right cervical: No superficial, deep or posterior cervical adenopathy.    Left cervical: No superficial, deep or posterior cervical adenopathy.     Upper Body:     Right upper body: No supraclavicular or axillary adenopathy.     Left upper body: No supraclavicular or axillary adenopathy.  Neurological:     General: No focal deficit present.     Mental Status: He is alert and oriented to person, place, and time.  Psychiatric:        Mood and  Affect: Mood normal.        Behavior: Behavior normal.     LABS:      Latest Ref Rng & Units 07/12/2023    9:49 AM 06/21/2023    2:34 PM 05/12/2023    9:38 AM  CBC  WBC 4.0 - 10.5 K/uL 5.8  5.5  4.5   Hemoglobin 13.0 - 17.0 g/dL 40.3  47.4  8.5   Hematocrit 39.0 - 52.0 % 36.7  32.2  25.6   Platelets 150 - 400 K/uL 167  199  217       Latest Ref Rng & Units 07/12/2023    9:49 AM 06/21/2023    2:34 PM 05/12/2023    9:38 AM  CMP  Glucose 70 - 99 mg/dL 259  563  875   BUN 8 - 23 mg/dL 18  23  33   Creatinine 0.61 - 1.24 mg/dL 6.43  3.29  5.18   Sodium 135 - 145 mmol/L 134  134  140   Potassium 3.5 - 5.1 mmol/L 4.2  3.8  4.1   Chloride 98 - 111 mmol/L 102  105  113   CO2 22 - 32 mmol/L 22  20  17    Calcium 8.9 - 10.3 mg/dL 8.6  8.6  8.3   Total Protein 6.5 - 8.1 g/dL 6.7  7.0  6.7   Total Bilirubin 0.3 - 1.2 mg/dL 0.5  0.7  0.7   Alkaline Phos 38 - 126 U/L 122  106  66   AST 15 - 41 U/L 35  20  28   ALT 0 - 44 U/L 29  18  38      No results found for: "CEA1", "  CEA" / No results found for: "CEA1", "CEA" No results found for: "PSA1" No results found for: "LOV564" No results found for: "CAN125"  No results found for: "TOTALPROTELP", "ALBUMINELP", "A1GS", "A2GS", "BETS", "BETA2SER", "GAMS", "MSPIKE", "SPEI" Lab Results  Component Value Date   TIBC 221 (L) 06/21/2023   TIBC 250 01/20/2023   TIBC 271 04/03/2009   FERRITIN 759 (H) 06/21/2023   FERRITIN 655 (H) 01/20/2023   FERRITIN 870 (H) 04/03/2009   IRONPCTSAT 18 06/21/2023   IRONPCTSAT 26 01/20/2023   IRONPCTSAT 72 (H) 04/03/2009   Lab Results  Component Value Date   LDH 248 (H) 01/20/2023     STUDIES:   No results found.

## 2023-07-12 NOTE — Progress Notes (Signed)
Patients port flushed without difficulty.  Good blood return noted with no bruising or swelling noted at site.  Band aid applied.  VSS with discharge and left in satisfactory condition with no s/s of distress noted.   

## 2023-07-12 NOTE — Patient Instructions (Addendum)
[facility] at [facility] Discharge Instructions   You were seen and examined today by Dr. Ellin Saba.  He reviewed the results of your lab work which are normal/stable.   Continue Cabometyx as prescribed. Dr. Kirtland Bouchard would like for you to restart the amlodipine (Norvasc) for your blood pressure.   We will see you back in 6 weeks. We will repeat blood work at that time.    Return as scheduled.    Thank you for choosing [facility] at [facility] to provide your oncology and hematology care.  To afford each patient quality time with our provider, please arrive at least 15 minutes before your scheduled appointment time.   If you have a lab appointment with the Cancer Center please come in thru the Main Entrance and check in at the main information desk.  You need to re-schedule your appointment should you arrive 10 or more minutes late.  We strive to give you quality time with our providers, and arriving late affects you and other patients whose appointments are after yours.  Also, if you no show three or more times for appointments you may be dismissed from the clinic at the providers discretion.     Again, thank you for choosing [facility].  Our hope is that these requests will decrease the amount of time that you wait before being seen by our physicians.       _____________________________________________________________  Should you have questions after your visit to [facility], please contact our office at [phone] and follow the prompts.  Our office hours are 8:00 a.m. and 4:30 p.m. Monday - Friday.  Please note that voicemails left after 4:00 p.m. may not be returned until the following business day.  We are closed weekends and major holidays.  You do have access to a nurse 24-7, just call the main number to the clinic [phone] and do not press any options, hold on the line and a nurse will answer the phone.     For prescription refill requests, have your pharmacy contact our office and allow 72 hours.    Due to Covid, you will need to wear a mask upon entering the hospital. If you do not have a mask, a mask will be given to you at the Main Entrance upon arrival. For doctor visits, patients may have 1 support person age 53 or older with them. For treatment visits, patients can not have anyone with them due to social distancing guidelines and our immunocompromised population.

## 2023-07-12 NOTE — Progress Notes (Signed)
Patient is taking Cabometyx as prescribed.  He has not missed any doses and reports no side effects at this time.

## 2023-07-13 ENCOUNTER — Other Ambulatory Visit: Payer: Self-pay

## 2023-07-13 ENCOUNTER — Ambulatory Visit (HOSPITAL_COMMUNITY): Payer: Medicare PPO | Admitting: Physical Therapy

## 2023-07-13 NOTE — Progress Notes (Signed)
Specialty Pharmacy Refill Coordination Note  David [name] is a 72 y.o. male contacted today regarding refills of specialty medication(s) Cabozantinib S-Malate (Antineoplastic Enzyme Inhibitors)   Patient requested Delivery   Delivery date: 07/20/23   Verified address: [address]   Medication will be filled on 07/19/23.

## 2023-07-13 NOTE — Progress Notes (Signed)
Specialty Pharmacy Ongoing Clinical Assessment Note  David [name] is a 72 y.o. male who is being followed by the specialty pharmacy service for RxSp Oncology   Patient's specialty medication(s) reviewed today: Cabozantinib S-Malate (Antineoplastic Enzyme Inhibitors)   Missed doses in the last 4 weeks: 0   Patient/Caregiver did not have any additional questions or concerns.   Therapeutic benefit summary: Unable to assess   Adverse events/side effects summary: No adverse events/side effects   Patient's therapy is appropriate to: Continue    Goals Addressed             This Visit's Progress    Stabilization of disease       Patient is on track. Patient will maintain adherence         Follow up:  3 months  [name] Specialty Pharmacist

## 2023-07-15 ENCOUNTER — Ambulatory Visit (HOSPITAL_COMMUNITY): Payer: Medicare PPO | Attending: Hematology | Admitting: Physical Therapy

## 2023-07-15 DIAGNOSIS — S31829S Unspecified open wound of left buttock, sequela: Secondary | ICD-10-CM | POA: Diagnosis not present

## 2023-07-15 DIAGNOSIS — M7918 Myalgia, other site: Secondary | ICD-10-CM | POA: Diagnosis not present

## 2023-07-15 DIAGNOSIS — M25552 Pain in left hip: Secondary | ICD-10-CM | POA: Insufficient documentation

## 2023-07-15 DIAGNOSIS — S71002S Unspecified open wound, left hip, sequela: Secondary | ICD-10-CM | POA: Insufficient documentation

## 2023-07-15 NOTE — Therapy (Signed)
OUTPATIENT PHYSICAL THERAPY Wound EVALUATION   Patient Name: David [name] MRN: [id number] DOB:09-30-1950, 72 y.o., male Today's Date: 07/15/2023   PCP: Dr. Magda Paganini PROVIDER: Doreatha Massed,  END OF SESSION:  PT End of Session - 07/15/23 1008     Visit Number 1    Number of Visits 12    Date for PT Re-Evaluation 08/26/23    Authorization Type [employer]/Medicaid    PT Start Time 0920    PT Stop Time 1002    PT Time Calculation (min) 42 min    Activity Tolerance Patient tolerated treatment well    Behavior During Therapy WFL for tasks assessed/performed             Past Medical History:  Diagnosis Date   Chronic kidney disease    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 Ronal Fear note   Pneumonia    Seizure [facility])    Past Surgical History:  Procedure Laterality Date   COLONOSCOPY  05/31/2012   Procedure: COLONOSCOPY;  Surgeon: Dalia Heading, MD;  [facility];  Service: Gastroenterology;  Laterality: N/A;   PORTACATH PLACEMENT Right 01/27/2023   Procedure: INSERTION PORT-A-CATH, RIJ;  Surgeon: Lewie Chamber, DO;  Location: [facility] OR[facility];  Service: General;  Laterality: Right;   ROBOT ASSISTED LAPAROSCOPIC NEPHRECTOMY Left 08/13/2022   Procedure: XI ROBOTIC ASSISTED LAPAROSCOPIC NEPHRECTOMY;  Surgeon: Malen Gauze, MD;  Location: [facility];  Service: Urology;  Laterality: Left;   TONSILLECTOMY     Patient Active Problem List   Diagnosis Date Noted   Clear cell renal cell carcinoma, left (HCC) 01/12/2023   Abdominal adhesions 08/13/2022   AKI (acute kidney injury) (HCC) 07/23/2022   Renal mass 07/23/2022   Lung nodule 07/23/2022   Urinary retention 07/23/2022   BPH (benign prostatic hyperplasia) 07/23/2022   Hepatitis C virus infection cured after antiviral drug therapy 07/13/2019   Elevated LFTs 07/13/2019   Seizure disorder (HCC) 01/14/2012    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    ONSET DATE: 03/30/23  REFERRING DIAG: Wound care for gludial fold  THERAPY DIAG:  Complicated open wound of left hip, sequela  Pain in left hip  Gluteal cleft wound, left, sequela  Gluteal pain  Rationale for Evaluation and Treatment: Rehabilitation     Wound Therapy - 07/15/23 0001     Subjective Pt states he is not sure what caused his wounds.  He states that he has had them for two months.  He has been to a dermatologist who gave him ointment to put on the wounds but is does not seem to be healing.  He has quite a bit of pain with it and states that he takes pain well.  He had 14 teeth pulled and did not take any pain medication.    Patient and Family Stated Goals wound to heal, pain to be gone.    Date of Onset 03/30/23    Prior Treatments self care, hh no longer getting HH    Pain Scale 0-10    Pain Score 8     Pain Location Buttocks   and left hip   Pain Descriptors / Indicators Aching    Pain Onset --   with sittng   Evaluation and Treatment Procedures Explained to Patient/Family Yes    Evaluation and Treatment Procedures agreed to    Wound Properties Date First Assessed: 07/15/23 Time First Assessed: 0920 Wound  Type: Other (Comment) , pressure  Location: Hip Location Orientation: Left Present on Admission: Yes   Wound Image Images linked: 2    Dressing Type None    Dressing Status None    Dressing Change Frequency PRN    Site / Wound Assessment Dry;Pink;Yellow    % Wound base Red or Granulating 80%    % Wound base Yellow/Fibrinous Exudate 20%    Wound Length (cm) 2.3 cm    Wound Width (cm) 0.8 cm    Wound Depth (cm) 0.2 cm    Wound Volume (cm^3) 0.37 cm^3    Wound Surface Area (cm^2) 1.84 cm^2    Margins Epibole (rolled edges)    Drainage Amount None    Treatment Cleansed;Debridement (Selective)    Wound Properties Date First Assessed: 07/15/23 Location: Buttocks Wound Description (Comments): medial wound on Lt buttock  Present on Admission: Yes   Wound Image --   see above   Dressing Type None    Dressing Status Dry    Dressing Change Frequency PRN    Site / Wound Assessment Dusky    % Wound base Red or Granulating 50%    % Wound base Yellow/Fibrinous Exudate 50%    Wound Length (cm) 2.6 cm    Wound Width (cm) 2.1 cm    Wound Depth (cm) 0.1 cm    Wound Volume (cm^3) 0.55 cm^3    Wound Surface Area (cm^2) 5.46 cm^2    Drainage Amount None    Treatment Cleansed;Debridement (Selective)    Wound Properties Date First Assessed: 07/15/23 Time First Assessed: 0955 Wound Type: Other (Comment) Location: Buttocks Location Orientation: Left Wound Description (Comments): More lateral of the two wounds Present on Admission: Yes   Dressing Type None    Dressing Change Frequency PRN    Site / Wound Assessment Dry;Dusky    % Wound base Red or Granulating 20%    % Wound base Yellow/Fibrinous Exudate 80%    Wound Length (cm) 1 cm    Wound Width (cm) 0.4 cm    Wound Depth (cm) 0.1 cm    Wound Volume (cm^3) 0.04 cm^3    Wound Surface Area (cm^2) 0.4 cm^2    Margins Epibole (rolled edges)    Drainage Amount None    Treatment Cleansed;Debridement (Selective)    Selective Debridement (non-excisional) - Location wound bed    Selective Debridement (non-excisional) - Tools Used Scalpel    Selective Debridement (non-excisional) - Tissue Removed eschar, slough    Wound Therapy - Clinical Statement Pt is49 yo male who has a hx of renal cell carcinoma on the lt who has had chronic wounds on his Lt hip and Lt buttock.  He has not been abel to heal them therefore his cancer MD has referred him to this clinic to assist in creating an imrproved healing environment. and progress the wound to heal.    Wound Therapy - Functional Problem List difficult to lie or sit    Factors Delaying/Impairing Wound Healing Altered sensation;Immobility;Multiple medical problems;Polypharmacy;Vascular compromise    Hydrotherapy Plan  Debridement;Dressing change;Patient/family education    Wound Therapy - Frequency --   for 6 weeks   Wound Therapy - Current Recommendations PT    Wound Therapy - Follow Up Recommendations Other (comment)    Wound Plan debridement., PT education and dressing    Dressing  medihoney 2x2 and medipore to Lt hip; medihoney 2x2; ab pad with cut out for wounds and medipore tape on Lt gluteal wounds  PATIENT EDUCATION: Education details: Keep dressing clean and dry, if soiled or wet remove,  Keep off buttock try and be Right side lying as much as possible.  Complete 10 glut sets every hour, increase protein intake.   Person educated: Patient Education method: Explanation Education comprehension: verbalized understanding   HOME EXERCISE PROGRAM: Glut sets    GOALS: Goals reviewed with patient? No  SHORT TERM GOALS: Target date: 08/05/23  Wounds to be 100% granulated to decrease risk of infection.  Baseline: Goal status: INITIAL  2.  PT pain to be no greater than a 5/10 Baseline:  Goal status: INITIAL  3.  PT wounds to be approximating.  Baseline:  Goal status: INITIAL   LONG TERM GOALS: Target date: 08/26/23  Wounds to be healed.  Baseline:  Goal status: INITIAL  2.  Pain in Lt buttock to be no greater than 2/10 Baseline:  Goal status: IN PROGRESS  ASSESSMENT:  CLINICAL IMPRESSION: Patient is a 72 y.o. male who was seen today for physical therapy evaluation and treatment for multiple non healing wounds. PT lives by himself and has nobody to assist in treating the wounds.  He has been trying to manage them for two months now but the pain is increasing and they are not healing therefore he was referred to skilled therapy wound care.   Evaluation demonstrates three non-healing wounds that are dry and have adherent eschar in the wound bed.  Mr. Horrocks will benefit from skilled PT to create a healing environment  to allow his wounds to heal as well as to  decrease his pain .    OBJECTIVE IMPAIRMENTS: decreased mobility, pain, and decreased skin integrity  .   ACTIVITY LIMITATIONS: sitting, sleeping, bathing, toileting, dressing, and hygiene/grooming   PERSONAL FACTORS: Fitness, Time since onset of injury/illness/exacerbation, and 1 comorbidity: cancer  are also affecting patient's functional outcome.   REHAB POTENTIAL: Good  CLINICAL DECISION MAKING: Evolving/moderate complexity  EVALUATION COMPLEXITY: Moderate  PLAN: PT FREQUENCY: 2x/week  PT DURATION: 6 weeks  PLANNED INTERVENTIONS: 97110-Therapeutic exercises, 97535- Self Care, and debridement and dressing change   PLAN FOR NEXT SESSION: Cut interior of a rectangular 1/2" foam out to put over buttock wound to decrease pressure as well as improve comfort of sitting.   Virgina Organ, PT CLT [phone]  07/15/2023, 12:05 PM

## 2023-07-16 ENCOUNTER — Ambulatory Visit (HOSPITAL_COMMUNITY): Payer: Medicare PPO | Admitting: Physical Therapy

## 2023-07-19 ENCOUNTER — Ambulatory Visit (HOSPITAL_COMMUNITY): Payer: Medicare PPO | Attending: Hematology

## 2023-07-19 DIAGNOSIS — S71002S Unspecified open wound, left hip, sequela: Secondary | ICD-10-CM | POA: Insufficient documentation

## 2023-07-19 DIAGNOSIS — M7918 Myalgia, other site: Secondary | ICD-10-CM | POA: Insufficient documentation

## 2023-07-19 DIAGNOSIS — M25552 Pain in left hip: Secondary | ICD-10-CM | POA: Insufficient documentation

## 2023-07-19 DIAGNOSIS — S31829S Unspecified open wound of left buttock, sequela: Secondary | ICD-10-CM | POA: Insufficient documentation

## 2023-07-20 ENCOUNTER — Encounter (HOSPITAL_COMMUNITY): Payer: Self-pay

## 2023-07-20 NOTE — Therapy (Signed)
OUTPATIENT PHYSICAL THERAPY Wound TREATMENT   Patient Name: David [name] MRN: [id number] DOB:07-28-1951, 72 y.o., male Today's Date: 07/20/2023   PCP: Dr. Magda Paganini PROVIDER: Doreatha Massed,  END OF SESSION:  PT End of Session - 07/20/23 0844     Visit Number 2    Number of Visits 12    Date for PT Re-Evaluation 08/26/23    Authorization Type [employer]/Medicaid    Authorization Time Period cohere approved 12 visits from 10/31-->08/26/23    Authorization - Visit Number 2    Authorization - Number of Visits 12    Progress Note Due on Visit 10    PT Start Time 1110    PT Stop Time 1145    PT Time Calculation (min) 35 min    Activity Tolerance Patient tolerated treatment well    Behavior During Therapy [facility] for tasks assessed/performed             Past Medical History:  Diagnosis Date   Chronic kidney disease    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 Ronal Fear note   Pneumonia    Seizure [facility])    Past Surgical History:  Procedure Laterality Date   COLONOSCOPY  05/31/2012   Procedure: COLONOSCOPY;  Surgeon: Dalia Heading, MD;  [facility];  Service: Gastroenterology;  Laterality: N/A;   PORTACATH PLACEMENT Right 01/27/2023   Procedure: INSERTION PORT-A-CATH, RIJ;  Surgeon: Lewie Chamber, DO;  Location: [facility];  Service: General;  Laterality: Right;   ROBOT ASSISTED LAPAROSCOPIC NEPHRECTOMY Left 08/13/2022   Procedure: XI ROBOTIC ASSISTED LAPAROSCOPIC NEPHRECTOMY;  Surgeon: Malen Gauze, MD;  Location: [facility];  Service: Urology;  Laterality: Left;   TONSILLECTOMY     Patient Active Problem List   Diagnosis Date Noted   Clear cell renal cell carcinoma, left (HCC) 01/12/2023   Abdominal adhesions 08/13/2022   AKI (acute kidney injury) (HCC) 07/23/2022   Renal mass 07/23/2022   Lung nodule 07/23/2022   Urinary retention 07/23/2022   BPH (benign  prostatic hyperplasia) 07/23/2022   Hepatitis C virus infection cured after antiviral drug therapy 07/13/2019   Elevated LFTs 07/13/2019   Seizure disorder (HCC) 01/14/2012   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    ONSET DATE: 03/30/23  REFERRING DIAG: Wound care for gludial fold  THERAPY DIAG:  Complicated open wound of left hip, sequela  Pain in left hip  Gluteal cleft wound, left, sequela  Gluteal pain  Rationale for Evaluation and Treatment: Rehabilitation     Wound Therapy - 07/20/23 0001     Subjective Pt arrived wiht dressings intact, reports of some ache in buttocks.    Patient and Family Stated Goals wound to heal, pain to be gone.    Date of Onset 03/30/23    Prior Treatments self care, hh no longer getting HH    Pain Scale 0-10    Pain Score 6     Pain Location Buttocks   Lt   Pain Orientation Left    Pain Descriptors / Indicators Aching    Pain Onset --   with sitting   Evaluation and Treatment Procedures Explained to Patient/Family Yes    Evaluation and Treatment Procedures agreed to    Wound Properties Date First Assessed: 07/15/23 Time First Assessed: 0920 Wound Type: Other (Comment) , pressure  Location: Hip Location Orientation: Left Present on Admission: Yes   Wound Image Images linked: 1  Dressing Type Gauze (Comment)   medihoney, 2x2, medipore tape   Dressing Changed Changed    Dressing Status Old drainage    Dressing Change Frequency --   medihoney, 2x2, medipore tape   Site / Wound Assessment Dry;Pink;Yellow    % Wound base Red or Granulating 80%    % Wound base Yellow/Fibrinous Exudate 20%    Margins Epibole (rolled edges)    Drainage Amount Scant    Drainage Description Serous    Treatment Cleansed;Debridement (Selective)    Wound Properties Date First Assessed: 07/15/23 Location: Buttocks Wound Description (Comments): medial wound on Lt buttock Present on Admission: Yes   Wound Image Images linked: 1    Dressing Type Gauze (Comment);Foam -  Lift dressing to assess site every shift   medihoney, 2x2, 1/2in foam, ABDmedipore tape   Dressing Status Dry    Dressing Change Frequency PRN    Site / Wound Assessment Dusky    % Wound base Red or Granulating 50%    % Wound base Yellow/Fibrinous Exudate 50%    Drainage Amount Scant    Drainage Description Serous    Treatment Cleansed;Debridement (Selective)    Wound Properties Date First Assessed: 07/15/23 Time First Assessed: 0955 Wound Type: Other (Comment) Location: Buttocks Location Orientation: Left Wound Description (Comments): More lateral of the two wounds Present on Admission: Yes   Dressing Type Foam - Lift dressing to assess site every shift;Gauze (Comment)   medihoney, 2x2, medipore tape   Dressing Changed Changed    Dressing Status Old drainage    Dressing Change Frequency PRN    Site / Wound Assessment Dry;Dusky    % Wound base Red or Granulating 20%    % Wound base Yellow/Fibrinous Exudate 80%    Margins Epibole (rolled edges)    Drainage Amount Scant    Treatment Cleansed;Debridement (Selective)    Selective Debridement (non-excisional) - Location wound bed    Selective Debridement (non-excisional) - Tools Used Scalpel    Selective Debridement (non-excisional) - Tissue Removed eschar, slough    Wound Therapy - Clinical Statement Pt is70 yo male who has a hx of renal cell carcinoma on the lt who has had chronic wounds on his Lt hip and Lt buttock.  He has not been abel to heal them therefore his cancer MD has referred him to this clinic to assist in creating an imrproved healing environment. and progress the wound to heal.    Wound Therapy - Functional Problem List difficult to lie or sit    Factors Delaying/Impairing Wound Healing Altered sensation;Immobility;Multiple medical problems;Polypharmacy;Vascular compromise    Hydrotherapy Plan Debridement;Dressing change;Patient/family education    Wound Therapy - Frequency 2X / week    Wound Therapy - Current Recommendations  PT    Wound Plan debridement., PT education and dressing    Dressing  medihoney 2x2 and medipore to Lt hip; medihoney 2x2; 1/2in foam and abd pad with cut out for wounds and medipore tape on Lt gluteal wounds               PATIENT EDUCATION: Education details: Keep dressing clean and dry, if soiled or wet remove,  Keep off buttock try and be Right side lying as much as possible.  Complete 10 glut sets every hour, increase protein intake.   Person educated: Patient Education method: Explanation Education comprehension: verbalized understanding   HOME EXERCISE PROGRAM: Glut sets    GOALS: Goals reviewed with patient? No  SHORT TERM GOALS: Target date: 08/05/23  Wounds  to be 100% granulated to decrease risk of infection.  Baseline: Goal status: INITIAL  2.  PT pain to be no greater than a 5/10 Baseline:  Goal status: INITIAL  3.  PT wounds to be approximating.  Baseline:  Goal status: INITIAL   LONG TERM GOALS: Target date: 08/26/23  Wounds to be healed.  Baseline:  Goal status: INITIAL  2.  Pain in Lt buttock to be no greater than 2/10 Baseline:  Goal status: IN PROGRESS  ASSESSMENT:  CLINICAL IMPRESSION: Selective debridment with focus on cross hatching to loosen adherent slough on wound bed.  Continued with medihoney to address slough.  Added 1/77foam for pressure relief over buttocks wound with reports of comfort at EOS.  Eval:  Patient is a 72 y.o. male who was seen today for physical therapy evaluation and treatment for multiple non healing wounds. PT lives by himself and has nobody to assist in treating the wounds.  He has been trying to manage them for two months now but the pain is increasing and they are not healing therefore he was referred to skilled therapy wound care.   Evaluation demonstrates three non-healing wounds that are dry and have adherent eschar in the wound bed.  David [name] will benefit from skilled PT to create a healing environment  to  allow his wounds to heal as well as to decrease his pain .    OBJECTIVE IMPAIRMENTS: decreased mobility, pain, and decreased skin integrity  .   ACTIVITY LIMITATIONS: sitting, sleeping, bathing, toileting, dressing, and hygiene/grooming   PERSONAL FACTORS: Fitness, Time since onset of injury/illness/exacerbation, and 1 comorbidity: cancer  are also affecting patient's functional outcome.   REHAB POTENTIAL: Good  CLINICAL DECISION MAKING: Evolving/moderate complexity  EVALUATION COMPLEXITY: Moderate  PLAN: PT FREQUENCY: 2x/week  PT DURATION: 6 weeks  PLANNED INTERVENTIONS: 97110-Therapeutic exercises, 97535- Self Care, and debridement and dressing change   PLAN FOR NEXT SESSION: Cut interior of a rectangular 1/2" foam out to put over buttock wound to decrease pressure as well as improve comfort of sitting.   David [name], LPTA/CLT; CBIS [phone]  David [name], PTA 07/20/2023, 8:45 AM  07/20/2023, 8:45 AM

## 2023-07-22 ENCOUNTER — Ambulatory Visit (HOSPITAL_COMMUNITY): Payer: Medicare PPO | Admitting: Physical Therapy

## 2023-07-22 DIAGNOSIS — S31829S Unspecified open wound of left buttock, sequela: Secondary | ICD-10-CM

## 2023-07-22 DIAGNOSIS — M7918 Myalgia, other site: Secondary | ICD-10-CM | POA: Diagnosis not present

## 2023-07-22 DIAGNOSIS — M25552 Pain in left hip: Secondary | ICD-10-CM

## 2023-07-22 DIAGNOSIS — S71002S Unspecified open wound, left hip, sequela: Secondary | ICD-10-CM | POA: Diagnosis not present

## 2023-07-22 NOTE — Therapy (Signed)
OUTPATIENT PHYSICAL THERAPY Wound TREATMENT   Patient Name: David [name] MRN: [id number] DOB:11-Aug-1951, 72 y.o., male Today's Date: 07/22/2023   PCP: Dr. Magda Paganini PROVIDER: Doreatha Massed,  END OF SESSION:  PT End of Session - 07/22/23 1052     Visit Number 3    Number of Visits 12    Date for PT Re-Evaluation 08/26/23    Authorization Type [employer]/Medicaid    Authorization Time Period cohere approved 12 visits from 10/31-->08/26/23    Authorization - Visit Number 3    Authorization - Number of Visits 12    Progress Note Due on Visit 10    PT Start Time 1010    PT Stop Time 1046    PT Time Calculation (min) 36 min    Activity Tolerance Patient tolerated treatment well    Behavior During Therapy WFL for tasks assessed/performed             Past Medical History:  Diagnosis Date   Chronic kidney disease    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 Ronal Fear note   Pneumonia    Seizure [facility])    Past Surgical History:  Procedure Laterality Date   COLONOSCOPY  05/31/2012   Procedure: COLONOSCOPY;  Surgeon: Dalia Heading, MD;  [facility];  Service: Gastroenterology;  Laterality: N/A;   PORTACATH PLACEMENT Right 01/27/2023   Procedure: INSERTION PORT-A-CATH, RIJ;  Surgeon: Lewie Chamber, DO;  Location: [facility];  Service: General;  Laterality: Right;   ROBOT ASSISTED LAPAROSCOPIC NEPHRECTOMY Left 08/13/2022   Procedure: XI ROBOTIC ASSISTED LAPAROSCOPIC NEPHRECTOMY;  Surgeon: Malen Gauze, MD;  Location: [facility];  Service: Urology;  Laterality: Left;   TONSILLECTOMY     Patient Active Problem List   Diagnosis Date Noted   Clear cell renal cell carcinoma, left (HCC) 01/12/2023   Abdominal adhesions 08/13/2022   AKI (acute kidney injury) (HCC) 07/23/2022   Renal mass 07/23/2022   Lung nodule 07/23/2022   Urinary retention 07/23/2022   BPH (benign  prostatic hyperplasia) 07/23/2022   Hepatitis C virus infection cured after antiviral drug therapy 07/13/2019   Elevated LFTs 07/13/2019   Seizure disorder (HCC) 01/14/2012   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    ONSET DATE: 03/30/23  REFERRING DIAG: Wound care for gludial fold  THERAPY DIAG:  Complicated open wound of left hip, sequela  Pain in left hip  Gluteal cleft wound, left, sequela  Gluteal pain  Rationale for Evaluation and Treatment: Rehabilitation     Wound Therapy - 07/22/23 0001     Subjective dressings intact.  No pain reported    Patient and Family Stated Goals wound to heal, pain to be gone.    Date of Onset 03/30/23    Prior Treatments self care, hh no longer getting HH    Wound Properties Date First Assessed: 07/15/23 Time First Assessed: 0920 Wound Type: Other (Comment) , pressure  Location: Hip Location Orientation: Left Present on Admission: Yes   Wound Image Images linked: 1    Dressing Type Honey;Gauze (Comment)    Dressing Changed Changed    Dressing Status Old drainage    Dressing Change Frequency PRN    Site / Wound Assessment Dry;Pink;Yellow    % Wound base Red or Granulating 90%    % Wound base Yellow/Fibrinous Exudate 10%    Wound Length (cm) 1.2 cm    Wound Width (cm) 3.2  cm    Wound Surface Area (cm^2) 3.84 cm^2    Margins Attached edges (approximated)    Drainage Amount Scant    Drainage Description Serous    Treatment Cleansed;Debridement (Selective)    Wound Properties Date First Assessed: 07/15/23 Location: Buttocks Wound Description (Comments): medial wound on Lt buttock Present on Admission: Yes   Wound Image Images linked: 1    Dressing Type Gauze (Comment);Honey    Dressing Status Clean, Dry, Intact    Dressing Change Frequency PRN    Site / Wound Assessment Clean;Dry    % Wound base Red or Granulating 50%    % Wound base Yellow/Fibrinous Exudate 50%    Wound Length (cm) 2.3 cm    Wound Width (cm) 1.8 cm    Wound Depth (cm)  0.1 cm    Wound Volume (cm^3) 0.41 cm^3    Wound Surface Area (cm^2) 4.14 cm^2    Undermining (cm) 0.2 inferior border from 4 o'clock to 8 o'clock    Drainage Amount Minimal    Drainage Description Serosanguineous    Treatment Cleansed;Debridement (Selective)    Wound Properties Date First Assessed: 07/15/23 Time First Assessed: 2841 Wound Type: Other (Comment) Location: Buttocks Location Orientation: Left Wound Description (Comments): More lateral of the two wounds Present on Admission: Yes   Dressing Type Gauze (Comment);Honey    Dressing Changed Changed    Dressing Status Old drainage    Dressing Change Frequency PRN    Site / Wound Assessment Clean;Dry    % Wound base Red or Granulating 40%    % Wound base Yellow/Fibrinous Exudate 60%    Wound Length (cm) 0.9 cm    Wound Width (cm) 0.4 cm    Wound Depth (cm) 0.1 cm    Wound Volume (cm^3) 0.04 cm^3    Wound Surface Area (cm^2) 0.36 cm^2    Margins Attached edges (approximated)    Drainage Amount Scant    Drainage Description Serosanguineous    Treatment Cleansed;Debridement (Selective)    Selective Debridement (non-excisional) - Location wound bed    Selective Debridement (non-excisional) - Tools Used Scalpel;Forceps    Selective Debridement (non-excisional) - Tissue Removed eschar, slough    Wound Therapy - Clinical Statement see below    Wound Therapy - Functional Problem List difficult to lie or sit    Factors Delaying/Impairing Wound Healing Altered sensation;Immobility;Multiple medical problems;Polypharmacy;Vascular compromise    Hydrotherapy Plan Debridement;Dressing change;Patient/family education    Wound Therapy - Frequency 2X / week    Wound Therapy - Current Recommendations PT    Wound Plan debridement., PT education and dressing    Dressing  xeroform, 2x2 and medipore to Lt hip; medihoney 2x2; 1/2in foam and abd pad with cut out for wounds and medipore tape on Lt gluteal wounds               PATIENT  EDUCATION: Education details: Keep dressing clean and dry, if soiled or wet remove,  Keep off buttock try and be Right side lying as much as possible.  Complete 10 glut sets every hour, increase protein intake.   Person educated: Patient Education method: Explanation Education comprehension: verbalized understanding   HOME EXERCISE PROGRAM: Glut sets    GOALS: Goals reviewed with patient? No  SHORT TERM GOALS: Target date: 08/05/23  Wounds to be 100% granulated to decrease risk of infection.  Baseline: Goal status: INITIAL  2.  PT pain to be no greater than a 5/10 Baseline:  Goal status: INITIAL  3.  PT wounds to be approximating.  Baseline:  Goal status: INITIAL   LONG TERM GOALS: Target date: 08/26/23  Wounds to be healed.  Baseline:  Goal status: INITIAL  2.  Pain in Lt buttock to be no greater than 2/10 Baseline:  Goal status: IN PROGRESS  ASSESSMENT:  CLINICAL IMPRESSION: Wounds measured and photographed this session.  Buttock wounds are approximating, however most inferior border is undermined from 4-8 o'clock approx 0.2cm.  Measurements for Lt hip are larger than initial evaluation, however pictures represent similarities go suspect was measurement error.  Hip wound is approximating and showing increase in granulation.  Changed dressing to xeroform for Lt hip wound and continued with medihoney for buttocks.  Noted pt with diffuculty standing from chair and walking back to wound room due to weakness/deconditioning.  Pt would benefit from initiation of therex for HEP.  Continued with added 1/2 inch foam for pressure relief over buttocks wound with reports of comfort at EOS.  Eval:  Patient is a 72 y.o. male who was seen today for physical therapy evaluation and treatment for multiple non healing wounds. PT lives by himself and has nobody to assist in treating the wounds.  He has been trying to manage them for two months now but the pain is increasing and they are not  healing therefore he was referred to skilled therapy wound care.   Evaluation demonstrates three non-healing wounds that are dry and have adherent eschar in the wound bed.  Mr. Finigan will benefit from skilled PT to create a healing environment  to allow his wounds to heal as well as to decrease his pain .    OBJECTIVE IMPAIRMENTS: decreased mobility, pain, and decreased skin integrity  .   ACTIVITY LIMITATIONS: sitting, sleeping, bathing, toileting, dressing, and hygiene/grooming   PERSONAL FACTORS: Fitness, Time since onset of injury/illness/exacerbation, and 1 comorbidity: cancer  are also affecting patient's functional outcome.   REHAB POTENTIAL: Good  CLINICAL DECISION MAKING: Evolving/moderate complexity  EVALUATION COMPLEXITY: Moderate  PLAN: PT FREQUENCY: 2x/week  PT DURATION: 6 weeks  PLANNED INTERVENTIONS: 97110-Therapeutic exercises, 97535- Self Care, and debridement and dressing change   PLAN FOR NEXT SESSION: Give pt LE strengthening HEP (I.e. sit to stands, LAQ, seated march, heel/toe raises standing).  Continue with foam for pressure relief; weekly measurements/phototgraphs.  Cleanse, debride and Change dressing as needed to promote healing environment.     Bascom Levels, Anivea Velasques B, PTA 07/22/2023, 11:12 AM  07/22/2023, 11:12 AM

## 2023-07-26 ENCOUNTER — Ambulatory Visit (HOSPITAL_COMMUNITY): Payer: Medicare PPO | Admitting: Physical Therapy

## 2023-07-26 DIAGNOSIS — M7918 Myalgia, other site: Secondary | ICD-10-CM

## 2023-07-26 DIAGNOSIS — S71002S Unspecified open wound, left hip, sequela: Secondary | ICD-10-CM

## 2023-07-26 DIAGNOSIS — S31829S Unspecified open wound of left buttock, sequela: Secondary | ICD-10-CM

## 2023-07-26 DIAGNOSIS — M25552 Pain in left hip: Secondary | ICD-10-CM | POA: Diagnosis not present

## 2023-07-26 NOTE — Therapy (Signed)
OUTPATIENT PHYSICAL THERAPY Wound TREATMENT   Patient Name: David [name] MRN: [id number] DOB:August 16, 1951, 72 y.o., male Today's Date: 07/26/2023   PCP: Dr. Magda Paganini PROVIDER: Doreatha Massed,  END OF SESSION:  PT End of Session - 07/26/23 1234     Visit Number 4    Number of Visits 12    Date for PT Re-Evaluation 08/26/23    Authorization Type [employer]/Medicaid    Authorization Time Period cohere approved 12 visits from 10/31-->08/26/23    Authorization - Number of Visits 12    Progress Note Due on Visit 10    PT Start Time 1104    PT Stop Time 1135    PT Time Calculation (min) 31 min    Activity Tolerance Patient tolerated treatment well    Behavior During Therapy [facility] for tasks assessed/performed              Past Medical History:  Diagnosis Date   Chronic kidney disease    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 Ronal Fear note   Pneumonia    Seizure [facility])    Past Surgical History:  Procedure Laterality Date   COLONOSCOPY  05/31/2012   Procedure: COLONOSCOPY;  Surgeon: Dalia Heading, MD;  [facility];  Service: Gastroenterology;  Laterality: N/A;   PORTACATH PLACEMENT Right 01/27/2023   Procedure: INSERTION PORT-A-CATH, RIJ;  Surgeon: Lewie Chamber, DO;  Location: [facility];  Service: General;  Laterality: Right;   ROBOT ASSISTED LAPAROSCOPIC NEPHRECTOMY Left 08/13/2022   Procedure: XI ROBOTIC ASSISTED LAPAROSCOPIC NEPHRECTOMY;  Surgeon: Malen Gauze, MD;  Location: [facility];  Service: Urology;  Laterality: Left;   TONSILLECTOMY     Patient Active Problem List   Diagnosis Date Noted   Clear cell renal cell carcinoma, left (HCC) 01/12/2023   Abdominal adhesions 08/13/2022   AKI (acute kidney injury) (HCC) 07/23/2022   Renal mass 07/23/2022   Lung nodule 07/23/2022   Urinary retention 07/23/2022   BPH (benign prostatic hyperplasia) 07/23/2022    Hepatitis C virus infection cured after antiviral drug therapy 07/13/2019   Elevated LFTs 07/13/2019   Seizure disorder (HCC) 01/14/2012   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    ONSET DATE: 03/30/23  REFERRING DIAG: Wound care for gludial fold  THERAPY DIAG:  Complicated open wound of left hip, sequela  Pain in left hip  Gluteal cleft wound, left, sequela  Gluteal pain  Rationale for Evaluation and Treatment: Rehabilitation     Wound Therapy - 07/26/23 1224     Subjective dressings intact.  No pain reported..  Difficulty rising from chair in waiting room.    Patient and Family Stated Goals wound to heal, pain to be gone.    Date of Onset 03/30/23    Prior Treatments self care, hh no longer getting HH    Pain Scale 0-10    Pain Score 0-No pain    Wound Properties Date First Assessed: 07/15/23 Time First Assessed: 0920 Wound Type: Other (Comment) , pressure  Location: Hip Location Orientation: Left Present on Admission: Yes   Dressing Type Honey;Gauze (Comment)    Dressing Changed Changed    Dressing Status Old drainage    Dressing Change Frequency PRN    Site / Wound Assessment Dry;Pink;Yellow    % Wound base Red or Granulating 95%    % Wound base Yellow/Fibrinous Exudate 5%    Margins Attached edges (approximated)  Drainage Amount Minimal    Drainage Description Serosanguineous    Treatment Cleansed;Debridement (Selective)    Wound Properties Date First Assessed: 07/15/23 Location: Buttocks Wound Description (Comments): medial wound on Lt buttock Present on Admission: Yes   Dressing Type Gauze (Comment);Honey    Dressing Changed Changed    Dressing Status Clean, Dry, Intact    Dressing Change Frequency PRN    Site / Wound Assessment Clean;Dry    % Wound base Red or Granulating 60%    % Wound base Yellow/Fibrinous Exudate 40%    Drainage Amount Minimal    Drainage Description Serosanguineous    Treatment Debridement (Selective);Cleansed    Wound Properties Date  First Assessed: 07/15/23 Time First Assessed: 0955 Wound Type: Other (Comment) Location: Buttocks Location Orientation: Left Wound Description (Comments): More lateral of the two wounds Present on Admission: Yes   Dressing Type Gauze (Comment);Honey    Dressing Changed Changed    Dressing Status Old drainage    Dressing Change Frequency PRN    Site / Wound Assessment Clean;Dry    % Wound base Red or Granulating 90%    % Wound base Yellow/Fibrinous Exudate 10%    Margins Attached edges (approximated)    Drainage Amount Minimal    Drainage Description Serosanguineous    Treatment Debridement (Selective);Cleansed    Selective Debridement (non-excisional) - Location wound bed    Selective Debridement (non-excisional) - Tools Used Scalpel;Forceps    Selective Debridement (non-excisional) - Tissue Removed eschar, slough    Wound Therapy - Clinical Statement see below    Wound Therapy - Functional Problem List difficult to lie or sit    Factors Delaying/Impairing Wound Healing Altered sensation;Immobility;Multiple medical problems;Polypharmacy;Vascular compromise    Hydrotherapy Plan Debridement;Dressing change;Patient/family education    Wound Therapy - Frequency 2X / week    Wound Therapy - Current Recommendations PT    Wound Plan debridement., PT education and dressing    Dressing  xeroform to al wounds, 2x2 and medipore.  1/2in foam cut out placed over buttock wound to reduce pressure.                PATIENT EDUCATION: Education details: Keep dressing clean and dry, if soiled or wet remove,  Keep off buttock try and be Right side lying as much as possible.  Complete 10 glut sets every hour, increase protein intake.   Person educated: Patient Education method: Explanation Education comprehension: verbalized understanding   HOME EXERCISE PROGRAM: Evaluation:  Glut sets   Access Code: [email or web address] URL: [email or web address] Date: 07/26/2023 Prepared by: Emeline Gins  Exercises - Seated Long Arc Quad  - 2 x daily - 7 x weekly - 2 sets - 10 reps - 5 second hold - Seated March  - 2 x daily - 7 x weekly - 2 sets - 10 reps - 5 second hold - Sit to Stand with Hands on Knees  - 2 x daily - 7 x weekly - 1 sets - 10 reps - Seated Heel Toe Raises  - 2 x daily - 7 x weekly - 1 sets - 10 reps   GOALS: Goals reviewed with patient? No  SHORT TERM GOALS: Target date: 08/05/23  Wounds to be 100% granulated to decrease risk of infection.  Baseline: Goal status: INITIAL  2.  PT pain to be no greater than a 5/10 Baseline:  Goal status: INITIAL  3.  PT wounds to be approximating.  Baseline:  Goal status: INITIAL   LONG TERM GOALS: Target date:  08/26/23  Wounds to be healed.  Baseline:  Goal status: INITIAL  2.  Pain in Lt buttock to be no greater than 2/10 Baseline:  Goal status: IN PROGRESS  ASSESSMENT:  CLINICAL IMPRESSION: Instructed with exercises and given written HEP to complete.  Wounds much improved today.  Debrided borders and changed remaining wound over to xeroform as well as the other two were much improved after switching last session. Less undermining today also at medial buttock wound.  Continued with added 1/2 inch foam for pressure relief over buttocks wound with reports of comfort at EOS.  Eval:  Patient is a 72 y.o. male who was seen today for physical therapy evaluation and treatment for multiple non healing wounds. PT lives by himself and has nobody to assist in treating the wounds.  He has been trying to manage them for two months now but the pain is increasing and they are not healing therefore he was referred to skilled therapy wound care.   Evaluation demonstrates three non-healing wounds that are dry and have adherent eschar in the wound bed.  Mr. Gawronski will benefit from skilled PT to create a healing environment  to allow his wounds to heal as well as to decrease his pain .    OBJECTIVE IMPAIRMENTS: decreased  mobility, pain, and decreased skin integrity  .   ACTIVITY LIMITATIONS: sitting, sleeping, bathing, toileting, dressing, and hygiene/grooming   PERSONAL FACTORS: Fitness, Time since onset of injury/illness/exacerbation, and 1 comorbidity: cancer  are also affecting patient's functional outcome.   REHAB POTENTIAL: Good  CLINICAL DECISION MAKING: Evolving/moderate complexity  EVALUATION COMPLEXITY: Moderate  PLAN: PT FREQUENCY: 2x/week  PT DURATION: 6 weeks  PLANNED INTERVENTIONS: 97110-Therapeutic exercises, 97535- Self Care, and debridement and dressing change   PLAN FOR NEXT SESSION: Continue with foam for pressure relief; weekly measurements/phototgraphs.  Cleanse, debride and Change dressing as needed to promote healing environment.     Emeline Gins B, PTA 07/26/2023, 12:34 PM  07/26/2023, 12:34 PM

## 2023-07-28 ENCOUNTER — Other Ambulatory Visit: Payer: Self-pay

## 2023-07-29 ENCOUNTER — Ambulatory Visit (HOSPITAL_COMMUNITY): Payer: Medicare PPO | Admitting: Physical Therapy

## 2023-07-29 DIAGNOSIS — M7918 Myalgia, other site: Secondary | ICD-10-CM

## 2023-07-29 DIAGNOSIS — M25552 Pain in left hip: Secondary | ICD-10-CM | POA: Diagnosis not present

## 2023-07-29 DIAGNOSIS — S31829S Unspecified open wound of left buttock, sequela: Secondary | ICD-10-CM

## 2023-07-29 DIAGNOSIS — S71002S Unspecified open wound, left hip, sequela: Secondary | ICD-10-CM | POA: Diagnosis not present

## 2023-07-29 NOTE — Therapy (Signed)
OUTPATIENT PHYSICAL THERAPY Wound TREATMENT   Patient Name: TAKI CURRIER MRN: [id number] DOB:08-17-1951, 72 y.o., male Today's Date: 07/29/2023   PCP: Dr. Magda Paganini PROVIDER: Doreatha Massed,  END OF SESSION:  PT End of Session - 07/29/23 1146     Visit Number 5    Number of Visits 12    Date for PT Re-Evaluation 08/26/23    Authorization Type [employer]/Medicaid    Authorization Time Period cohere approved 12 visits from 10/31-->08/26/23    Authorization - Visit Number 5    Authorization - Number of Visits 12    Progress Note Due on Visit 10    PT Start Time 1103    PT Stop Time 1135    PT Time Calculation (min) 32 min    Activity Tolerance Patient tolerated treatment well    Behavior During Therapy WFL for tasks assessed/performed               Past Medical History:  Diagnosis Date   Chronic kidney disease    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 Ronal Fear note   Pneumonia    Seizure [facility])    Past Surgical History:  Procedure Laterality Date   COLONOSCOPY  05/31/2012   Procedure: COLONOSCOPY;  Surgeon: Dalia Heading, MD;  [facility];  Service: Gastroenterology;  Laterality: N/A;   PORTACATH PLACEMENT Right 01/27/2023   Procedure: INSERTION PORT-A-CATH, RIJ;  Surgeon: Lewie Chamber, DO;  Location: [facility];  Service: General;  Laterality: Right;   ROBOT ASSISTED LAPAROSCOPIC NEPHRECTOMY Left 08/13/2022   Procedure: XI ROBOTIC ASSISTED LAPAROSCOPIC NEPHRECTOMY;  Surgeon: Malen Gauze, MD;  Location: [facility];  Service: Urology;  Laterality: Left;   TONSILLECTOMY     Patient Active Problem List   Diagnosis Date Noted   Clear cell renal cell carcinoma, left (HCC) 01/12/2023   Abdominal adhesions 08/13/2022   AKI (acute kidney injury) (HCC) 07/23/2022   Renal mass 07/23/2022   Lung nodule 07/23/2022   Urinary retention 07/23/2022   BPH (benign  prostatic hyperplasia) 07/23/2022   Hepatitis C virus infection cured after antiviral drug therapy 07/13/2019   Elevated LFTs 07/13/2019   Seizure disorder (HCC) 01/14/2012   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    ONSET DATE: 03/30/23  REFERRING DIAG: Wound care for gludial fold  THERAPY DIAG:  Complicated open wound of left hip, sequela  Pain in left hip  Gluteal cleft wound, left, sequela  Gluteal pain  Rationale for Evaluation and Treatment: Rehabilitation     Wound Therapy - 07/29/23 0001     Subjective dressings intact.  No pain reported..  Difficulty rising from chair in waiting room.    Patient and Family Stated Goals wound to heal, pain to be gone.    Date of Onset 03/30/23    Prior Treatments self care, hh no longer getting HH    Pain Score 3     Evaluation and Treatment Procedures Explained to Patient/Family Yes    Evaluation and Treatment Procedures agreed to    Wound Properties Date First Assessed: 07/15/23 Time First Assessed: 0920 Wound Type: Other (Comment) , pressure  Location: Hip Location Orientation: Left Present on Admission: Yes   Wound Image Images linked: 2    Dressing Type Honey;Gauze (Comment)    Dressing Changed Changed    Dressing Status Old drainage    Dressing Change Frequency PRN    Site /  Wound Assessment Dry;Pink;Yellow    % Wound base Red or Granulating 90%    % Wound base Yellow/Fibrinous Exudate 10%    Wound Length (cm) 3.3 cm    Wound Width (cm) 0.9 cm    Wound Surface Area (cm^2) 2.97 cm^2    Margins Epibole (rolled edges)    Drainage Amount Scant    Drainage Description Serosanguineous    Treatment Cleansed;Debridement (Selective)    Wound Properties Date First Assessed: 07/15/23 Location: Buttocks Wound Description (Comments): medial wound on Lt buttock Present on Admission: Yes   Dressing Type Gauze (Comment);Honey    Dressing Changed Changed    Dressing Status Clean, Dry, Intact    Dressing Change Frequency PRN    Site / Wound  Assessment Clean;Dry    % Wound base Red or Granulating 50%    % Wound base Yellow/Fibrinous Exudate 50%    Wound Length (cm) 2.2 cm    Wound Width (cm) 1.3 cm    Wound Depth (cm) 0.1 cm    Wound Volume (cm^3) 0.29 cm^3    Wound Surface Area (cm^2) 2.86 cm^2    Drainage Amount Moderate    Drainage Description Serous    Treatment Cleansed;Debridement (Selective)    Wound Properties Date First Assessed: 07/15/23 Time First Assessed: 0955 Wound Type: Other (Comment) Location: Buttocks Location Orientation: Left Wound Description (Comments): More lateral of the two wounds Present on Admission: Yes   Dressing Type Gauze (Comment);Honey    Dressing Changed Changed    Dressing Status Old drainage    Dressing Change Frequency PRN    Site / Wound Assessment Clean;Dry    % Wound base Red or Granulating 50%    % Wound base Yellow/Fibrinous Exudate 50%    Wound Length (cm) 0.9 cm    Wound Width (cm) 0.3 cm    Wound Surface Area (cm^2) 0.27 cm^2    Margins Attached edges (approximated)    Drainage Amount Scant    Treatment Cleansed    Selective Debridement (non-excisional) - Location wound bed; epiboled edges    Selective Debridement (non-excisional) - Tools Used Scalpel;Forceps    Selective Debridement (non-excisional) - Tissue Removed eschar, slough    Wound Therapy - Clinical Statement see below    Wound Therapy - Functional Problem List difficult to lie or sit    Factors Delaying/Impairing Wound Healing Altered sensation;Immobility;Multiple medical problems;Polypharmacy;Vascular compromise    Hydrotherapy Plan Debridement;Dressing change;Patient/family education    Wound Therapy - Frequency 2X / week    Wound Therapy - Current Recommendations PT    Wound Plan debridement., PT education and dressing    Dressing  medihoney to hip and lateral wound.  Medial wound had increased drainage thereefore therapist put calcium alginate on this area.  Hip wound coverd with medipore tape, buttock wounds  covered with 2x2 sthen tegaderm, 1/2 in foam for preassure relief followed secured with medipore tape.                PATIENT EDUCATION: Education details: Keep dressing clean and dry, if soiled or wet remove,  Keep off buttock try and be Right side lying as much as possible.  Complete 10 glut sets every hour, increase protein intake.   Person educated: Patient Education method: Explanation Education comprehension: verbalized understanding   HOME EXERCISE PROGRAM: Evaluation:  Glut sets   Access Code: [email or web address] URL: [email or web address] Date: 07/26/2023 Prepared by: Emeline Gins  Exercises - Seated Long Arc Quad  - 2 x daily - 7 x weekly -  2 sets - 10 reps - 5 second hold - Seated March  - 2 x daily - 7 x weekly - 2 sets - 10 reps - 5 second hold - Sit to Stand with Hands on Knees  - 2 x daily - 7 x weekly - 1 sets - 10 reps - Seated Heel Toe Raises  - 2 x daily - 7 x weekly - 1 sets - 10 reps   GOALS: Goals reviewed with patient? No  SHORT TERM GOALS: Target date: 08/05/23  Wounds to be 100% granulated to decrease risk of infection.  Baseline: Goal status: IN PROGRESS  2.  PT pain to be no greater than a 5/10 Baseline:  Goal status: MET  3.  PT wounds to be approximating.  Baseline:  Goal status: IN PROGRESS   LONG TERM GOALS: Target date: 08/26/23  Wounds to be healed.  Baseline:  Goal status: IN PROGRESS  2.  Pain in Lt buttock to be no greater than 2/10 Baseline:  Goal status: IN PROGRESS  ASSESSMENT:  CLINICAL IMPRESSION: PT wounds are slowly granulating.  Noted epiboled edges therefore therapist attempted to debride at these areas.  Pt had two wound bags in cabinet which were combined into one.  Pt will continue to benefit from skilled PT to assist in healing wounds. .  Eval:  Patient is a 72 y.o. male who was seen today for physical therapy evaluation and treatment for multiple non healing wounds. PT lives by himself and has nobody  to assist in treating the wounds.  He has been trying to manage them for two months now but the pain is increasing and they are not healing therefore he was referred to skilled therapy wound care.   Evaluation demonstrates three non-healing wounds that are dry and have adherent eschar in the wound bed.  Mr. Easterwood will benefit from skilled PT to create a healing environment  to allow his wounds to heal as well as to decrease his pain .    OBJECTIVE IMPAIRMENTS: decreased mobility, pain, and decreased skin integrity  .   ACTIVITY LIMITATIONS: sitting, sleeping, bathing, toileting, dressing, and hygiene/grooming   PERSONAL FACTORS: Fitness, Time since onset of injury/illness/exacerbation, and 1 comorbidity: cancer  are also affecting patient's functional outcome.   REHAB POTENTIAL: Good  CLINICAL DECISION MAKING: Evolving/moderate complexity  EVALUATION COMPLEXITY: Moderate  PLAN: PT FREQUENCY: 2x/week  PT DURATION: 6 weeks  PLANNED INTERVENTIONS: 97110-Therapeutic exercises, 97535- Self Care, and debridement and dressing change   PLAN FOR NEXT SESSION: Continue with foam for pressure relief; weekly measurements/phototgraphs.  Cleanse, debride and Change dressing as needed to promote healing environment.    Virgina Organ, PT CLT [phone]

## 2023-08-02 ENCOUNTER — Ambulatory Visit (HOSPITAL_COMMUNITY): Payer: Medicare PPO | Admitting: Physical Therapy

## 2023-08-02 DIAGNOSIS — M25552 Pain in left hip: Secondary | ICD-10-CM | POA: Diagnosis not present

## 2023-08-02 DIAGNOSIS — S31829S Unspecified open wound of left buttock, sequela: Secondary | ICD-10-CM

## 2023-08-02 DIAGNOSIS — M7918 Myalgia, other site: Secondary | ICD-10-CM

## 2023-08-02 DIAGNOSIS — S71002S Unspecified open wound, left hip, sequela: Secondary | ICD-10-CM | POA: Diagnosis not present

## 2023-08-02 NOTE — Therapy (Signed)
OUTPATIENT PHYSICAL THERAPY Wound TREATMENT   Patient Name: David [name] MRN: [id number] DOB:08-02-1951, 72 y.o., male Today's Date: 08/02/2023   PCP: Dr. Magda Paganini PROVIDER: Doreatha Massed,  END OF SESSION:  PT End of Session - 08/02/23 1137     Visit Number 6    Number of Visits 12    Date for PT Re-Evaluation 08/26/23    Authorization Type [employer]/Medicaid    Authorization Time Period cohere approved 12 visits from 10/31-->08/26/23    Authorization - Visit Number 6    Authorization - Number of Visits 12    Progress Note Due on Visit 10    PT Start Time 1100    PT Stop Time 1130    PT Time Calculation (min) 30 min    Activity Tolerance Patient tolerated treatment well    Behavior During Therapy [facility] for tasks assessed/performed               Past Medical History:  Diagnosis Date   Chronic kidney disease    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 Ronal Fear note   Pneumonia    Seizure [facility])    Past Surgical History:  Procedure Laterality Date   COLONOSCOPY  05/31/2012   Procedure: COLONOSCOPY;  Surgeon: Dalia Heading, MD;  [facility];  Service: Gastroenterology;  Laterality: N/A;   PORTACATH PLACEMENT Right 01/27/2023   Procedure: INSERTION PORT-A-CATH, RIJ;  Surgeon: Lewie Chamber, DO;  Location: [facility];  Service: General;  Laterality: Right;   ROBOT ASSISTED LAPAROSCOPIC NEPHRECTOMY Left 08/13/2022   Procedure: XI ROBOTIC ASSISTED LAPAROSCOPIC NEPHRECTOMY;  Surgeon: Malen Gauze, MD;  Location: [facility];  Service: Urology;  Laterality: Left;   TONSILLECTOMY     Patient Active Problem List   Diagnosis Date Noted   Clear cell renal cell carcinoma, left (HCC) 01/12/2023   Abdominal adhesions 08/13/2022   AKI (acute kidney injury) (HCC) 07/23/2022   Renal mass 07/23/2022   Lung nodule 07/23/2022   Urinary retention 07/23/2022   BPH (benign  prostatic hyperplasia) 07/23/2022   Hepatitis C virus infection cured after antiviral drug therapy 07/13/2019   Elevated LFTs 07/13/2019   Seizure disorder (HCC) 01/14/2012   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    ONSET DATE: 03/30/23  REFERRING DIAG: Wound care for gludial fold  THERAPY DIAG:  Complicated open wound of left hip, sequela  Gluteal cleft wound, left, sequela  Pain in left hip  Gluteal pain  Rationale for Evaluation and Treatment: Rehabilitation     Wound Therapy - 08/02/23 0001     Subjective Pt states that the dressing on his hip came off.    Patient and Family Stated Goals wound to heal, pain to be gone.    Date of Onset 03/30/23    Prior Treatments self care, hh no longer getting HH    Pain Score 3     Evaluation and Treatment Procedures Explained to Patient/Family Yes    Evaluation and Treatment Procedures agreed to    Wound Properties Date First Assessed: 07/15/23 Time First Assessed: 0920 Wound Type: Other (Comment) , pressure  Location: Hip Location Orientation: Left Present on Admission: Yes   Dressing Type Honey;Gauze (Comment)    Dressing Changed Changed    Dressing Status Old drainage    Dressing Change Frequency PRN    Site / Wound Assessment Dry;Pink;Yellow    % Wound base Red or Granulating  90%    % Wound base Yellow/Fibrinous Exudate 10%    Margins Epibole (rolled edges)    Drainage Amount Scant    Drainage Description Serous    Treatment Cleansed;Debridement (Selective)    Wound Properties Date First Assessed: 07/15/23 Location: Buttocks Wound Description (Comments): medial wound on Lt buttock Present on Admission: Yes   Dressing Type Gauze (Comment);Honey    Dressing Changed Changed    Dressing Status Clean, Dry, Intact    Dressing Change Frequency PRN    Site / Wound Assessment Clean;Dry    % Wound base Red or Granulating 55%    % Wound base Yellow/Fibrinous Exudate 45%    Margins Epibole (rolled edges)    Drainage Amount Minimal     Drainage Description Serous    Treatment Cleansed;Debridement (Selective)    Wound Properties Date First Assessed: 07/15/23 Time First Assessed: 0955 Wound Type: Other (Comment) Location: Buttocks Location Orientation: Left Wound Description (Comments): More lateral of the two wounds Present on Admission: Yes   Dressing Type Gauze (Comment);Honey    Dressing Changed Changed    Dressing Status Old drainage    Dressing Change Frequency PRN    Site / Wound Assessment Clean;Dry    % Wound base Red or Granulating 50%    % Wound base Yellow/Fibrinous Exudate 50%    Peri-wound Assessment Erythema (blanchable)    Margins Epibole (rolled edges)    Drainage Amount Scant    Treatment Cleansed;Debridement (Selective)    Selective Debridement (non-excisional) - Location wound bed; epiboled edges    Selective Debridement (non-excisional) - Tools Used Scalpel;Forceps    Selective Debridement (non-excisional) - Tissue Removed eschar, slough    Wound Therapy - Clinical Statement see below    Wound Therapy - Functional Problem List difficult to lie or sit    Factors Delaying/Impairing Wound Healing Altered sensation;Immobility;Multiple medical problems;Polypharmacy;Vascular compromise    Hydrotherapy Plan Debridement;Dressing change;Patient/family education    Wound Therapy - Frequency 2X / week    Wound Therapy - Current Recommendations PT    Wound Plan debridement., PT education and dressing    Dressing  xeroform followed by tegaderm to LT hip; medihoney, 2x2 followed by tegaderm then AB pad with wound area cut out followed by medipore tape for pressure relief                PATIENT EDUCATION: Education details: Keep dressing clean and dry, if soiled or wet remove,  Keep off buttock try and be Right side lying as much as possible.  Complete 10 glut sets every hour, increase protein intake.   Person educated: Patient Education method: Explanation Education comprehension: verbalized  understanding   HOME EXERCISE PROGRAM: Evaluation:  Glut sets   Access Code: [email or web address] URL: [email or web address] Date: 07/26/2023 Prepared by: Emeline Gins  Exercises - Seated Long Arc Quad  - 2 x daily - 7 x weekly - 2 sets - 10 reps - 5 second hold - Seated March  - 2 x daily - 7 x weekly - 2 sets - 10 reps - 5 second hold - Sit to Stand with Hands on Knees  - 2 x daily - 7 x weekly - 1 sets - 10 reps - Seated Heel Toe Raises  - 2 x daily - 7 x weekly - 1 sets - 10 reps   GOALS: Goals reviewed with patient? No  SHORT TERM GOALS: Target date: 08/05/23  Wounds to be 100% granulated to decrease risk of infection.  Baseline: Goal status: IN  PROGRESS  2.  PT pain to be no greater than a 5/10 Baseline:  Goal status: MET  3.  PT wounds to be approximating.  Baseline:  Goal status: IN PROGRESS   LONG TERM GOALS: Target date: 08/26/23  Wounds to be healed.  Baseline:  Goal status: IN PROGRESS  2.  Pain in Lt buttock to be no greater than 2/10 Baseline:  Goal status: IN PROGRESS  ASSESSMENT:  CLINICAL IMPRESSION: All wounds have epiboled edges therefore therapist  debrided the edges within pt tolerance.  Wound beds cross hatched with scalpel to improve granulation.  Skin around wounds appeared irritated most likely from foam therefore therapist returned to ab pad for pressure relief.  .  Pt will continue to benefit from skilled PT to assist in healing wounds. .  Eval:  Patient is a 72 y.o. male who was seen today for physical therapy evaluation and treatment for multiple non healing wounds. PT lives by himself and has nobody to assist in treating the wounds.  He has been trying to manage them for two months now but the pain is increasing and they are not healing therefore he was referred to skilled therapy wound care.   Evaluation demonstrates three non-healing wounds that are dry and have adherent eschar in the wound bed.  Mr. Cowdin will benefit from  skilled PT to create a healing environment  to allow his wounds to heal as well as to decrease his pain .    OBJECTIVE IMPAIRMENTS: decreased mobility, pain, and decreased skin integrity  .   ACTIVITY LIMITATIONS: sitting, sleeping, bathing, toileting, dressing, and hygiene/grooming   PERSONAL FACTORS: Fitness, Time since onset of injury/illness/exacerbation, and 1 comorbidity: cancer  are also affecting patient's functional outcome.   REHAB POTENTIAL: Good  CLINICAL DECISION MAKING: Evolving/moderate complexity  EVALUATION COMPLEXITY: Moderate  PLAN: PT FREQUENCY: 2x/week  PT DURATION: 6 weeks  PLANNED INTERVENTIONS: 97110-Therapeutic exercises, 97535- Self Care, and debridement and dressing change   PLAN FOR NEXT SESSION: Continue with foam for pressure relief; weekly measurements/phototgraphs.  Cleanse, debride and Change dressing as needed to promote healing environment.    Virgina Organ, PT CLT [phone]

## 2023-08-05 ENCOUNTER — Other Ambulatory Visit: Payer: Self-pay | Admitting: Hematology

## 2023-08-05 ENCOUNTER — Other Ambulatory Visit: Payer: Self-pay

## 2023-08-05 ENCOUNTER — Ambulatory Visit (HOSPITAL_COMMUNITY): Payer: Medicare PPO | Admitting: Physical Therapy

## 2023-08-05 DIAGNOSIS — M7918 Myalgia, other site: Secondary | ICD-10-CM | POA: Diagnosis not present

## 2023-08-05 DIAGNOSIS — S31829S Unspecified open wound of left buttock, sequela: Secondary | ICD-10-CM | POA: Diagnosis not present

## 2023-08-05 DIAGNOSIS — M25552 Pain in left hip: Secondary | ICD-10-CM

## 2023-08-05 DIAGNOSIS — S71002S Unspecified open wound, left hip, sequela: Secondary | ICD-10-CM | POA: Diagnosis not present

## 2023-08-05 DIAGNOSIS — C642 Malignant neoplasm of left kidney, except renal pelvis: Secondary | ICD-10-CM

## 2023-08-05 NOTE — Progress Notes (Signed)
Specialty Pharmacy Refill Coordination Note  David [name] is a 72 y.o. male contacted today regarding refills of specialty medication(s) Cabozantinib S-Malate (Antineoplastic Enzyme Inhibitors)   Patient requested Delivery   Delivery date: 08/16/23   Verified address: [address] [place] [address]   Medication will be filled on 08/13/23. *Pending Refill Request* Call if any delays.

## 2023-08-05 NOTE — Therapy (Signed)
OUTPATIENT PHYSICAL THERAPY Wound TREATMENT   Patient Name: David [name] MRN: [id number] DOB:1950-11-06, 72 y.o., male Today's Date: 08/05/2023   PCP: Dr. Magda Paganini PROVIDER: Doreatha Massed,  END OF SESSION:  PT End of Session - 08/05/23 1141     Visit Number 7    Number of Visits 12    Date for PT Re-Evaluation 08/26/23    Authorization Type [employer]/Medicaid    Authorization Time Period cohere approved 12 visits from 10/31-->08/26/23    Authorization - Visit Number 7    Authorization - Number of Visits 12    Progress Note Due on Visit 10    PT Start Time 1015    PT Stop Time 1050    PT Time Calculation (min) 35 min    Activity Tolerance Patient tolerated treatment well    Behavior During Therapy [facility] for tasks assessed/performed               Past Medical History:  Diagnosis Date   Chronic kidney disease    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 Ronal Fear note   Pneumonia    Seizure [facility])    Past Surgical History:  Procedure Laterality Date   COLONOSCOPY  05/31/2012   Procedure: COLONOSCOPY;  Surgeon: Dalia Heading, MD;  [facility];  Service: Gastroenterology;  Laterality: N/A;   PORTACATH PLACEMENT Right 01/27/2023   Procedure: INSERTION PORT-A-CATH, RIJ;  Surgeon: Lewie Chamber, DO;  Location: [facility] ORS;  Service: General;  Laterality: Right;   ROBOT ASSISTED LAPAROSCOPIC NEPHRECTOMY Left 08/13/2022   Procedure: XI ROBOTIC ASSISTED LAPAROSCOPIC NEPHRECTOMY;  Surgeon: Malen Gauze, MD;  Location: [facility] ORS;  Service: Urology;  Laterality: Left;   TONSILLECTOMY     Patient Active Problem List   Diagnosis Date Noted   Clear cell renal cell carcinoma, left (HCC) 01/12/2023   Abdominal adhesions 08/13/2022   AKI (acute kidney injury) (HCC) 07/23/2022   Renal mass 07/23/2022   Lung nodule 07/23/2022   Urinary retention 07/23/2022   BPH (benign  prostatic hyperplasia) 07/23/2022   Hepatitis C virus infection cured after antiviral drug therapy 07/13/2019   Elevated LFTs 07/13/2019   Seizure disorder (HCC) 01/14/2012   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    ONSET DATE: 03/30/23  REFERRING DIAG: Wound care for gludial fold  THERAPY DIAG:  Complicated open wound of left hip, sequela  Gluteal cleft wound, left, sequela  Pain in left hip  Rationale for Evaluation and Treatment: Rehabilitation     Wound Therapy - 08/05/23 0001     Subjective Pt has no complaints    Patient and Family Stated Goals wound to heal, pain to be gone.    Date of Onset 03/30/23    Prior Treatments self care, hh no longer getting HH    Pain Score 1     Evaluation and Treatment Procedures Explained to Patient/Family Yes    Evaluation and Treatment Procedures agreed to    Wound Properties Date First Assessed: 08/05/23 Time First Assessed: 1030 Wound Type: Other (Comment) Location: Buttocks Location Orientation: Left;Distal Wound Description (Comments): skin came off with tape removal. Present on Admission: No   Dressing Type Gauze (Comment)    Dressing Changed Changed    Dressing Change Frequency PRN    % Wound base Red or Granulating 100%    Peri-wound Assessment Erythema (blanchable)    Wound Length (cm) 0.5 cm  Wound Width (cm) 2 cm    Wound Surface Area (cm^2) 1 cm^2    Treatment Cleansed;Packing (Plain strip)    Wound Properties Date First Assessed: 07/15/23 Time First Assessed: 0920 Wound Type: Other (Comment) , pressure  Location: Hip Location Orientation: Left Present on Admission: Yes   Dressing Type Honey;Gauze (Comment)    Dressing Changed Changed    Dressing Status Old drainage    Dressing Change Frequency PRN    Site / Wound Assessment Dry;Pink;Yellow    % Wound base Red or Granulating 70%    % Wound base Yellow/Fibrinous Exudate 30%    Wound Length (cm) 3.1 cm    Wound Width (cm) 0.7 cm    Wound Depth (cm) 0.2 cm    Wound Volume  (cm^3) 0.43 cm^3    Wound Surface Area (cm^2) 2.17 cm^2    Margins Epibole (rolled edges)    Drainage Amount None    Treatment Cleansed;Debridement (Selective)    Wound Properties Date First Assessed: 07/15/23 Location: Buttocks Wound Description (Comments): medial wound on Lt buttock Present on Admission: Yes   Dressing Type Gauze (Comment);Honey    Dressing Status Clean, Dry, Intact    Dressing Change Frequency PRN    Site / Wound Assessment Clean;Dry    % Wound base Red or Granulating 55%    % Wound base Yellow/Fibrinous Exudate 45%    Wound Length (cm) 2.2 cm    Wound Width (cm) 1.2 cm    Wound Depth (cm) 0.1 cm    Wound Volume (cm^3) 0.26 cm^3    Wound Surface Area (cm^2) 2.64 cm^2    Margins Epibole (rolled edges)    Drainage Amount Scant    Drainage Description Serous    Treatment Cleansed;Debridement (Selective)    Wound Properties Date First Assessed: 07/15/23 Time First Assessed: 0955 Wound Type: Other (Comment) Location: Buttocks Location Orientation: Left Wound Description (Comments): More lateral of the two wounds Present on Admission: Yes   Dressing Type Gauze (Comment);Honey    Dressing Changed Changed    Dressing Status Old drainage    Dressing Change Frequency PRN    Site / Wound Assessment Clean;Dry    % Wound base Red or Granulating 50%    % Wound base Yellow/Fibrinous Exudate 50%    Peri-wound Assessment Erythema (blanchable)    Wound Length (cm) 0.8 cm    Wound Width (cm) 0.3 cm    Wound Surface Area (cm^2) 0.24 cm^2    Margins Epibole (rolled edges)    Drainage Amount Scant    Treatment Cleansed;Debridement (Selective)    Selective Debridement (non-excisional) - Location wound bed; epiboled edges    Selective Debridement (non-excisional) - Tools Used Scalpel;Forceps    Selective Debridement (non-excisional) - Tissue Removed eschar, slough    Wound Therapy - Clinical Statement see below    Wound Therapy - Functional Problem List difficult to lie or sit     Factors Delaying/Impairing Wound Healing Altered sensation;Immobility;Multiple medical problems;Polypharmacy;Vascular compromise    Hydrotherapy Plan Debridement;Dressing change;Patient/family education    Wound Therapy - Frequency 2X / week    Wound Therapy - Current Recommendations PT    Wound Plan debridement., PT education and dressing    Dressing  xeroformx 2x2 followed by tegaderm.  Therapist did not use medipore tape as it pulled pt skin off .                PATIENT EDUCATION: Education details: Keep dressing clean and dry, if soiled or wet remove,  Keep off buttock try and be Right side lying as much as possible.  Complete 10 glut sets every hour, increase protein intake.   Person educated: Patient Education method: Explanation Education comprehension: verbalized understanding   HOME EXERCISE PROGRAM: Evaluation:  Glut sets   Access Code: [email or web address] URL: [email or web address] Date: 07/26/2023 Prepared by: Emeline Gins  Exercises - Seated Long Arc Quad  - 2 x daily - 7 x weekly - 2 sets - 10 reps - 5 second hold - Seated March  - 2 x daily - 7 x weekly - 2 sets - 10 reps - 5 second hold - Sit to Stand with Hands on Knees  - 2 x daily - 7 x weekly - 1 sets - 10 reps - Seated Heel Toe Raises  - 2 x daily - 7 x weekly - 1 sets - 10 reps   GOALS: Goals reviewed with patient? No  SHORT TERM GOALS: Target date: 08/05/23  Wounds to be 100% granulated to decrease risk of infection.  Baseline: Goal status: IN PROGRESS  2.  PT pain to be no greater than a 5/10 Baseline:  Goal status: MET  3.  PT wounds to be approximating.  Baseline:  Goal status: IN PROGRESS   LONG TERM GOALS: Target date: 08/26/23  Wounds to be healed.  Baseline:  Goal status: IN PROGRESS  2.  Pain in Lt buttock to be no greater than 2/10 Baseline:  Goal status: IN PROGRESS  ASSESSMENT:  CLINICAL IMPRESSION: All wounds continue to have epiboled edges.  Upon removing  medipore tape off pt tape pulled a skin patch off inferior to the two buttock wounds.  Therapist will try tegaderm only to hold dressing in place.   OBJECTIVE IMPAIRMENTS: decreased mobility, pain, and decreased skin integrity  .   ACTIVITY LIMITATIONS: sitting, sleeping, bathing, toileting, dressing, and hygiene/grooming   PERSONAL FACTORS: Fitness, Time since onset of injury/illness/exacerbation, and 1 comorbidity: cancer  are also affecting patient's functional outcome.   REHAB POTENTIAL: Good  CLINICAL DECISION MAKING: Evolving/moderate complexity  EVALUATION COMPLEXITY: Moderate  PLAN: PT FREQUENCY: 2x/week  PT DURATION: 6 weeks  PLANNED INTERVENTIONS: 97110-Therapeutic exercises, 97535- Self Care, and debridement and dressing change   PLAN FOR NEXT SESSION: Trial of just tegaderm to hold dressing as pt skin is fragile medipore tape pulled skin off upon removal.    Cleanse, debride and Change dressing as needed to promote healing environment.    Virgina Organ, PT CLT [phone]

## 2023-08-06 ENCOUNTER — Other Ambulatory Visit (HOSPITAL_COMMUNITY): Payer: Self-pay

## 2023-08-06 ENCOUNTER — Other Ambulatory Visit: Payer: Self-pay

## 2023-08-10 ENCOUNTER — Ambulatory Visit (HOSPITAL_COMMUNITY): Payer: Medicare PPO | Admitting: Physical Therapy

## 2023-08-10 DIAGNOSIS — S31829S Unspecified open wound of left buttock, sequela: Secondary | ICD-10-CM | POA: Diagnosis not present

## 2023-08-10 DIAGNOSIS — M25552 Pain in left hip: Secondary | ICD-10-CM | POA: Diagnosis not present

## 2023-08-10 DIAGNOSIS — M7918 Myalgia, other site: Secondary | ICD-10-CM

## 2023-08-10 DIAGNOSIS — S71002S Unspecified open wound, left hip, sequela: Secondary | ICD-10-CM | POA: Diagnosis not present

## 2023-08-10 NOTE — Therapy (Signed)
OUTPATIENT PHYSICAL THERAPY Wound TREATMENT   Patient Name: David [name] MRN: [id number] DOB:06-08-1951, 72 y.o., male Today's Date: 08/10/2023   PCP: Dr. Magda Paganini PROVIDER: Doreatha Massed,  END OF SESSION:  PT End of Session - 08/10/23 0921     Visit Number 8    Number of Visits 12    Date for PT Re-Evaluation 08/26/23    Authorization Type [employer]/Medicaid    Authorization Time Period cohere approved 12 visits from 10/31-->08/26/23    Authorization - Visit Number 8    Authorization - Number of Visits 12    Progress Note Due on Visit 10    PT Start Time 0850    PT Stop Time 0915    PT Time Calculation (min) 25 min    Activity Tolerance Patient tolerated treatment well    Behavior During Therapy [facility] for tasks assessed/performed               Past Medical History:  Diagnosis Date   Chronic kidney disease    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 Ronal Fear note   Pneumonia    Seizure [facility])    Past Surgical History:  Procedure Laterality Date   COLONOSCOPY  05/31/2012   Procedure: COLONOSCOPY;  Surgeon: Dalia Heading, MD;  [facility];  Service: Gastroenterology;  Laterality: N/A;   PORTACATH PLACEMENT Right 01/27/2023   Procedure: INSERTION PORT-A-CATH, RIJ;  Surgeon: Lewie Chamber, DO;  Location: [facility];  Service: General;  Laterality: Right;   ROBOT ASSISTED LAPAROSCOPIC NEPHRECTOMY Left 08/13/2022   Procedure: XI ROBOTIC ASSISTED LAPAROSCOPIC NEPHRECTOMY;  Surgeon: Malen Gauze, MD;  Location: [facility];  Service: Urology;  Laterality: Left;   TONSILLECTOMY     Patient Active Problem List   Diagnosis Date Noted   Clear cell renal cell carcinoma, left (HCC) 01/12/2023   Abdominal adhesions 08/13/2022   AKI (acute kidney injury) (HCC) 07/23/2022   Renal mass 07/23/2022   Lung nodule 07/23/2022   Urinary retention 07/23/2022   BPH (benign  prostatic hyperplasia) 07/23/2022   Hepatitis C virus infection cured after antiviral drug therapy 07/13/2019   Elevated LFTs 07/13/2019   Seizure disorder (HCC) 01/14/2012   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    ONSET DATE: 03/30/23  REFERRING DIAG: Wound care for gludial fold  THERAPY DIAG:  Complicated open wound of left hip, sequela  Gluteal cleft wound, left, sequela  Pain in left hip  Gluteal pain  Rationale for Evaluation and Treatment: Rehabilitation     Wound Therapy - 08/10/23 0923     Subjective pt states the sacral dressing stayed on for 2 days before falling off.  Hip dressing still on but crinkled up.    Patient and Family Stated Goals wound to heal, pain to be gone.    Date of Onset 03/30/23    Prior Treatments self care, hh no longer getting HH    Pain Scale 0-10    Pain Score 0-No pain    Evaluation and Treatment Procedures Explained to Patient/Family Yes    Evaluation and Treatment Procedures agreed to    Wound Properties Date First Assessed: 08/05/23 Time First Assessed: 1030 Wound Type: Other (Comment) Location: Buttocks Location Orientation: Left;Distal Wound Description (Comments): skin came off with tape removal. Present on Admission: No   Wound Image Images linked: 1    Dressing Type Gauze (Comment)    Dressing Changed Changed  Dressing Status Old drainage    Dressing Change Frequency PRN    % Wound base Red or Granulating 100%    Peri-wound Assessment Erythema (blanchable)    Wound Length (cm) 0.5 cm    Wound Width (cm) 0.2 cm    Wound Surface Area (cm^2) 0.1 cm^2    Drainage Amount Scant    Drainage Description Serosanguineous    Treatment Cleansed;Debridement (Selective)    Wound Properties Date First Assessed: 07/15/23 Time First Assessed: 0920 Wound Type: Other (Comment) , pressure  Location: Hip Location Orientation: Left Present on Admission: Yes   Wound Image Images linked: 1    Dressing Type Gauze (Comment)    Dressing Changed Changed     Dressing Status Old drainage    Dressing Change Frequency PRN    Site / Wound Assessment Dry;Pink;Yellow    % Wound base Red or Granulating 85%    % Wound base Yellow/Fibrinous Exudate 15%    Wound Length (cm) 3 cm    Wound Width (cm) 0.7 cm    Wound Depth (cm) 0.2 cm    Wound Volume (cm^3) 0.42 cm^3    Wound Surface Area (cm^2) 2.1 cm^2    Margins Epibole (rolled edges)    Drainage Amount Minimal    Drainage Description Serosanguineous    Treatment Cleansed;Debridement (Selective)    Wound Properties Date First Assessed: 07/15/23 Location: Buttocks Wound Description (Comments): medial wound on Lt buttock Present on Admission: Yes   Dressing Type Gauze (Comment);Honey    Dressing Changed Changed    Dressing Status Clean, Dry, Intact    Dressing Change Frequency PRN    Site / Wound Assessment Clean;Dry    Wound Length (cm) 2.2 cm    Wound Width (cm) 1.2 cm    Wound Depth (cm) 0.1 cm    Wound Volume (cm^3) 0.26 cm^3    Wound Surface Area (cm^2) 2.64 cm^2    Margins Epibole (rolled edges)    Drainage Amount Minimal    Drainage Description Serosanguineous    Treatment Cleansed;Debridement (Selective)    Wound Properties Date First Assessed: 07/15/23 Time First Assessed: 0955 Wound Type: Other (Comment) Location: Buttocks Location Orientation: Left Wound Description (Comments): More lateral of the two wounds Present on Admission: Yes   Dressing Type Gauze (Comment);Honey    Dressing Status Old drainage    Dressing Change Frequency --    Site / Wound Assessment --    Peri-wound Assessment --    Margins --    Selective Debridement (non-excisional) - Location wound bed; epiboled edges    Selective Debridement (non-excisional) - Tools Used Scalpel;Forceps    Selective Debridement (non-excisional) - Tissue Removed eschar, slough    Wound Therapy - Clinical Statement see below    Wound Therapy - Functional Problem List difficult to lie or sit    Factors Delaying/Impairing Wound  Healing Altered sensation;Immobility;Multiple medical problems;Polypharmacy;Vascular compromise    Hydrotherapy Plan Debridement;Dressing change;Patient/family education    Wound Therapy - Frequency 2X / week    Wound Therapy - Current Recommendations PT    Wound Plan debridement., PT education and dressing    Dressing  xeroformx 2x2 followed by tegaderm.  Therapist did not use medipore tape as it pulled pt skin off .                PATIENT EDUCATION: Education details: Keep dressing clean and dry, if soiled or wet remove,  Keep off buttock try and be Right side lying as much as possible.  Complete 10 glut sets every hour, increase protein intake.   Person educated: Patient Education method: Explanation Education comprehension: verbalized understanding   HOME EXERCISE PROGRAM: Evaluation:  Glut sets   Access Code: [email or web address] URL: [email or web address] Date: 07/26/2023 Prepared by: Emeline Gins  Exercises - Seated Long Arc Quad  - 2 x daily - 7 x weekly - 2 sets - 10 reps - 5 second hold - Seated March  - 2 x daily - 7 x weekly - 2 sets - 10 reps - 5 second hold - Sit to Stand with Hands on Knees  - 2 x daily - 7 x weekly - 1 sets - 10 reps - Seated Heel Toe Raises  - 2 x daily - 7 x weekly - 1 sets - 10 reps   GOALS: Goals reviewed with patient? No  SHORT TERM GOALS: Target date: 08/05/23  Wounds to be 100% granulated to decrease risk of infection.  Baseline: Goal status: IN PROGRESS  2.  PT pain to be no greater than a 5/10 Baseline:  Goal status: MET  3.  PT wounds to be approximating.  Baseline:  Goal status: IN PROGRESS   LONG TERM GOALS: Target date: 08/26/23  Wounds to be healed.  Baseline:  Goal status: IN PROGRESS  2.  Pain in Lt buttock to be no greater than 2/10 Baseline:  Goal status: IN PROGRESS  ASSESSMENT:  CLINICAL IMPRESSION: PT comes today with no dressing on Lt buttock and Lt hip dressing peeled back half way.  Noted  maceration around hip wound due to moisture.  Lt buttock dry perimeter but wounds with moisture.  Changed all dressing to hydrofiber due to nature of tegaderm traping moisture.  Clenased well and photographed (in media). Wounds continue to have epiboled edges but noted sacral wound with less undermining present that orginally.  PT admits to sitting mostly.  Educated on how this heads healing due to pressure. Encouraged to get a cushion for pressure relief and to move hip to hip, get up frequently throughout the day.      OBJECTIVE IMPAIRMENTS: decreased mobility, pain, and decreased skin integrity  .   ACTIVITY LIMITATIONS: sitting, sleeping, bathing, toileting, dressing, and hygiene/grooming   PERSONAL FACTORS: Fitness, Time since onset of injury/illness/exacerbation, and 1 comorbidity: cancer  are also affecting patient's functional outcome.   REHAB POTENTIAL: Good  CLINICAL DECISION MAKING: Evolving/moderate complexity  EVALUATION COMPLEXITY: Moderate  PLAN: PT FREQUENCY: 2x/week  PT DURATION: 6 weeks  PLANNED INTERVENTIONS: 97110-Therapeutic exercises, 97535- Self Care, and debridement and dressing change   PLAN FOR NEXT SESSION: Cleanse, debride and Change dressing as needed to promote healing environment.    Lurena Nida, PTA/CLT [facility] [place] Ph: [phone]  Lurena Nida, PTA 08/10/2023, 11:42 AM

## 2023-08-11 ENCOUNTER — Other Ambulatory Visit: Payer: Self-pay

## 2023-08-11 ENCOUNTER — Encounter: Payer: Self-pay | Admitting: Hematology

## 2023-08-11 MED ORDER — CABOMETYX 40 MG PO TABS
40.0000 mg | ORAL_TABLET | Freq: Every day | ORAL | 3 refills | Status: DC
  Filled 2023-08-11: qty 30, 30d supply, fill #0
  Filled 2023-09-02: qty 30, 30d supply, fill #1
  Filled 2023-10-04: qty 30, 30d supply, fill #2

## 2023-08-11 NOTE — Telephone Encounter (Signed)
Chart reviewed. Cabometyx refilled per last office note with Dr. Ellin Saba.

## 2023-08-13 ENCOUNTER — Other Ambulatory Visit: Payer: Self-pay

## 2023-08-16 ENCOUNTER — Ambulatory Visit (HOSPITAL_COMMUNITY): Payer: Medicare PPO | Attending: Hematology | Admitting: Physical Therapy

## 2023-08-16 DIAGNOSIS — S31829S Unspecified open wound of left buttock, sequela: Secondary | ICD-10-CM | POA: Diagnosis not present

## 2023-08-16 DIAGNOSIS — M7918 Myalgia, other site: Secondary | ICD-10-CM | POA: Diagnosis not present

## 2023-08-16 DIAGNOSIS — S71002S Unspecified open wound, left hip, sequela: Secondary | ICD-10-CM | POA: Insufficient documentation

## 2023-08-16 DIAGNOSIS — M25552 Pain in left hip: Secondary | ICD-10-CM | POA: Insufficient documentation

## 2023-08-16 NOTE — Therapy (Signed)
OUTPATIENT PHYSICAL THERAPY Wound TREATMENT   Patient Name: David [name] MRN: [id number] DOB:August 08, 1951, 72 y.o., male Today's Date: 08/16/2023   PCP: Dr. Magda Paganini PROVIDER: Doreatha Massed,  END OF SESSION:  PT End of Session - 08/16/23 1005     Visit Number 9    Number of Visits 12    Date for PT Re-Evaluation 08/26/23    Authorization Type [employer]/Medicaid    Authorization Time Period cohere approved 12 visits from 10/31-->08/26/23    Authorization - Visit Number 9    Authorization - Number of Visits 12    Progress Note Due on Visit 10    PT Start Time 0920    PT Stop Time 1000    PT Time Calculation (min) 40 min    Activity Tolerance Patient tolerated treatment well    Behavior During Therapy [facility] for tasks assessed/performed                Past Medical History:  Diagnosis Date   Chronic kidney disease    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 Ronal Fear note   Pneumonia    Seizure [facility])    Past Surgical History:  Procedure Laterality Date   COLONOSCOPY  05/31/2012   Procedure: COLONOSCOPY;  Surgeon: Dalia Heading, MD;  Location: [facility];  Service: Gastroenterology;  Laterality: N/A;   PORTACATH PLACEMENT Right 01/27/2023   Procedure: INSERTION PORT-A-CATH, RIJ;  Surgeon: Lewie Chamber, DO;  Location: [facility] OR[facility];  Service: General;  Laterality: Right;   ROBOT ASSISTED LAPAROSCOPIC NEPHRECTOMY Left 08/13/2022   Procedure: XI ROBOTIC ASSISTED LAPAROSCOPIC NEPHRECTOMY;  Surgeon: Malen Gauze, MD;  Location: [facility];  Service: Urology;  Laterality: Left;   TONSILLECTOMY     Patient Active Problem List   Diagnosis Date Noted   Clear cell renal cell carcinoma, left (HCC) 01/12/2023   Abdominal adhesions 08/13/2022   AKI (acute kidney injury) (HCC) 07/23/2022   Renal mass 07/23/2022   Lung nodule 07/23/2022   Urinary retention 07/23/2022   BPH (benign  prostatic hyperplasia) 07/23/2022   Hepatitis C virus infection cured after antiviral drug therapy 07/13/2019   Elevated LFTs 07/13/2019   Seizure disorder (HCC) 01/14/2012   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    ONSET DATE: 03/30/23  REFERRING DIAG: Wound care for gludial fold  THERAPY DIAG:  Complicated open wound of left hip, sequela  Gluteal cleft wound, left, sequela  Pain in left hip  Gluteal pain  Rationale for Evaluation and Treatment: Rehabilitation     Wound Therapy - 08/16/23 0001     Subjective Pt has no complaints    Patient and Family Stated Goals wound to heal, pain to be gone.    Date of Onset 03/30/23    Prior Treatments self care, hh no longer getting HH    Pain Scale 0-10    Pain Score 4    back; an old issue   Evaluation and Treatment Procedures Explained to Patient/Family Yes    Evaluation and Treatment Procedures agreed to    Wound Properties Date First Assessed: 08/05/23 Time First Assessed: 1030 Wound Type: Other (Comment) Location: Buttocks Location Orientation: Left;Distal Wound Description (Comments): skin came off with tape removal. Present on Admission: No Final Assessment Date: 08/16/23 Final Assessment Time: 1001   Dressing Type --    Dressing Status --    Dressing Change Frequency --    Peri-wound Assessment --  Wound Properties Date First Assessed: 07/15/23 Time First Assessed: 0920 Wound Type: Other (Comment) , pressure  Location: Hip Location Orientation: Left Present on Admission: Yes   Dressing Type None    Dressing Status --    Dressing Change Frequency PRN    Site / Wound Assessment Dry;Pink;Yellow    % Wound base Red or Granulating 80%    % Wound base Yellow/Fibrinous Exudate 20%    Margins Epibole (rolled edges)    Drainage Amount None    Treatment Cleansed;Debridement (Selective)    Wound Properties Date First Assessed: 07/15/23 Location: Buttocks Wound Description (Comments): medial wound on Lt buttock Present on Admission:  Yes   Dressing Type Gauze (Comment);Honey;None    Dressing Status Clean, Dry, Intact    Dressing Change Frequency PRN    Site / Wound Assessment Clean;Dry    % Wound base Red or Granulating 50%    % Wound base Yellow/Fibrinous Exudate 50%    Margins Epibole (rolled edges)    Drainage Amount None    Treatment Cleansed;Debridement (Selective)    Wound Properties Date First Assessed: 07/15/23 Time First Assessed: 0955 Wound Type: Other (Comment) Location: Buttocks Location Orientation: Left Wound Description (Comments): More lateral of the two wounds Present on Admission: Yes   Dressing Type Honey;None    Dressing Status Old drainage    % Wound base Red or Granulating 40%    % Wound base Yellow/Fibrinous Exudate 60%    Margins Epibole (rolled edges)    Treatment Cleansed;Debridement (Selective)    Selective Debridement (non-excisional) - Location wound bed; epiboled edges    Selective Debridement (non-excisional) - Tools Used Scalpel;Forceps    Selective Debridement (non-excisional) - Tissue Removed eschar, slough    Wound Therapy - Clinical Statement see below    Wound Therapy - Functional Problem List difficult to lie or sit    Factors Delaying/Impairing Wound Healing Altered sensation;Immobility;Multiple medical problems;Polypharmacy;Vascular compromise    Hydrotherapy Plan Debridement;Dressing change;Patient/family education    Wound Therapy - Frequency 2X / week    Wound Therapy - Current Recommendations PT    Wound Plan debridement., PT education and dressing    Dressing  Lt hip medihoney 2x2 and tegaderm    Dressing Lt buttock medihoney 4x4 ab pad and medipore tape.                 PATIENT EDUCATION: Education details: Keep dressing clean and dry, if soiled or wet remove,  Keep off buttock try and be Right side lying as much as possible.  Complete 10 glut sets every hour, increase protein intake.   Person educated: Patient Education method: Explanation Education  comprehension: verbalized understanding   HOME EXERCISE PROGRAM: Evaluation:  Glut sets   Access Code: [email or web address] URL: [email or web address] Date: 07/26/2023 Prepared by: Emeline Gins  Exercises - Seated Long Arc Quad  - 2 x daily - 7 x weekly - 2 sets - 10 reps - 5 second hold - Seated March  - 2 x daily - 7 x weekly - 2 sets - 10 reps - 5 second hold - Sit to Stand with Hands on Knees  - 2 x daily - 7 x weekly - 1 sets - 10 reps - Seated Heel Toe Raises  - 2 x daily - 7 x weekly - 1 sets - 10 reps   GOALS: Goals reviewed with patient? No  SHORT TERM GOALS: Target date: 08/05/23  Wounds to be 100% granulated to decrease risk of infection.  Baseline: Goal status:  IN PROGRESS  2.  PT pain to be no greater than a 5/10 Baseline:  Goal status: MET  3.  PT wounds to be approximating.  Baseline:  Goal status: IN PROGRESS   LONG TERM GOALS: Target date: 08/26/23  Wounds to be healed.  Baseline:  Goal status: IN PROGRESS  2.  Pain in Lt buttock to be no greater than 2/10 Baseline:  Goal status: IN PROGRESS  ASSESSMENT:  CLINICAL IMPRESSION: Pt has no dressings on wounds.  Pt appears SOB O2 taken at 83.  Therapist worked with pt on diaphragm breathing for several minutes and was able to get O2 up to 92.  Pt states he does not feel different than normal.  Therapist again went over diaphragm breathing  techniques and instructed pt to complete 10 breaths this way every waking hour.  Pt wounds were uncovered as dressings came off decreased granulation in all aspects.  Superficial skin layer sloughed off between the two LT buttock wounds making a wound 6 cm long with an oval area of depth on each end.  Therapist stressed the importance of keeping off wounds.  Wound dressing changed once again in attempt to find a dressing that will stay of the pt.     OBJECTIVE IMPAIRMENTS: decreased mobility, pain, and decreased skin integrity  .   ACTIVITY LIMITATIONS: sitting,  sleeping, bathing, toileting, dressing, and hygiene/grooming   PERSONAL FACTORS: Fitness, Time since onset of injury/illness/exacerbation, and 1 comorbidity: cancer  are also affecting patient's functional outcome.   REHAB POTENTIAL: Good  CLINICAL DECISION MAKING: Evolving/moderate complexity  EVALUATION COMPLEXITY: Moderate  PLAN: PT FREQUENCY: 2x/week  PT DURATION: 6 weeks  PLANNED INTERVENTIONS: 97110-Therapeutic exercises, 97535- Self Care, and debridement and dressing change   PLAN FOR NEXT SESSION: Cleanse, debride and continue to work with dressing to find a dressing that will stay intact to promote healing environment.    Virgina Organ, PT CLT [phone]  08/16/2023, 10:11 AM

## 2023-08-18 ENCOUNTER — Ambulatory Visit (HOSPITAL_COMMUNITY): Payer: Medicare PPO | Admitting: Physical Therapy

## 2023-08-18 DIAGNOSIS — M25552 Pain in left hip: Secondary | ICD-10-CM | POA: Diagnosis not present

## 2023-08-18 DIAGNOSIS — S71002S Unspecified open wound, left hip, sequela: Secondary | ICD-10-CM

## 2023-08-18 DIAGNOSIS — S31829S Unspecified open wound of left buttock, sequela: Secondary | ICD-10-CM | POA: Diagnosis not present

## 2023-08-18 DIAGNOSIS — M7918 Myalgia, other site: Secondary | ICD-10-CM | POA: Diagnosis not present

## 2023-08-18 NOTE — Therapy (Signed)
OUTPATIENT PHYSICAL THERAPY Wound TREATMENT   Patient Name: David [name] MRN: [id number] DOB:Apr 15, 1951, 72 y.o., male Today's Date: 08/18/2023   PCP: Dr. Magda Paganini PROVIDER: Doreatha Massed,  END OF SESSION:  PT End of Session - 08/18/23 0909     Visit Number 10    Number of Visits 24    Date for PT Re-Evaluation 10/07/23    Authorization Type [employer]/Medicaid    Authorization Time Period cohere approved 12 visits from 10/31-->08/26/23    Authorization - Visit Number 10    Authorization - Number of Visits 12    Progress Note Due on Visit 20    PT Start Time 0803    PT Stop Time 0845    PT Time Calculation (min) 42 min    Activity Tolerance Patient tolerated treatment well    Behavior During Therapy [facility] for tasks assessed/performed                Past Medical History:  Diagnosis Date   Chronic kidney disease    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 Ronal Fear note   Pneumonia    Seizure [facility])    Past Surgical History:  Procedure Laterality Date   COLONOSCOPY  05/31/2012   Procedure: COLONOSCOPY;  Surgeon: Dalia Heading, MD;  Location[facility];  Service: Gastroenterology;  Laterality: N/A;   PORTACATH PLACEMENT Right 01/27/2023   Procedure: INSERTION PORT-A-CATH, RIJ;  Surgeon: Lewie Chamber, DO;  Location: [facility];  Service: General;  Laterality: Right;   ROBOT ASSISTED LAPAROSCOPIC NEPHRECTOMY Left 08/13/2022   Procedure: XI ROBOTIC ASSISTED LAPAROSCOPIC NEPHRECTOMY;  Surgeon: Malen Gauze, MD;  Location: [facility];  Service: Urology;  Laterality: Left;   TONSILLECTOMY     Patient Active Problem List   Diagnosis Date Noted   Clear cell renal cell carcinoma, left (HCC) 01/12/2023   Abdominal adhesions 08/13/2022   AKI (acute kidney injury) (HCC) 07/23/2022   Renal mass 07/23/2022   Lung nodule 07/23/2022   Urinary retention 07/23/2022   BPH (benign  prostatic hyperplasia) 07/23/2022   Hepatitis C virus infection cured after antiviral drug therapy 07/13/2019   Elevated LFTs 07/13/2019   Seizure disorder (HCC) 01/14/2012   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    ONSET DATE: 03/30/23  REFERRING DIAG: Wound care for gludial fold  THERAPY DIAG:  Gluteal cleft wound, left, sequela  Gluteal pain  Pain in left hip  Complicated open wound of left hip, sequela  Rationale for Evaluation and Treatment: Rehabilitation     Wound Therapy - 08/18/23 0001     Subjective Pt has no complaints    Patient and Family Stated Goals wound to heal, pain to be gone.    Date of Onset 03/30/23    Prior Treatments self care, hh no longer getting HH    Pain Scale 0-10    Pain Score 3     Evaluation and Treatment Procedures Explained to Patient/Family Yes    Evaluation and Treatment Procedures agreed to    Wound Properties Date First Assessed: 07/15/23 Time First Assessed: 0920 Wound Type: Other (Comment) , pressure  Location: Hip Location Orientation: Left Present on Admission: Yes   Dressing Type None    Dressing Changed Changed    Dressing Status None    Dressing Change Frequency PRN    Site / Wound Assessment Dry;Pink;Yellow    % Wound base Red or Granulating 80%  was 80%   % Wound base Yellow/Fibrinous Exudate 20%   was 20%   Wound Length (cm) 3 cm   was 2.3   Wound Width (cm) 0.7 cm   was .8   Wound Depth (cm) 0.2 cm    Wound Volume (cm^3) 0.42 cm^3    Wound Surface Area (cm^2) 2.1 cm^2    Margins Unattached edges (unapproximated)   was epiboled   Drainage Amount None    Treatment Cleansed;Debridement (Selective)    Wound Properties Date First Assessed: 07/15/23 Location: Buttocks Wound Description (Comments): medial wound on Lt buttock Present on Admission: Yes   Dressing Type Gauze (Comment);Honey;None    Dressing Changed Changed    Dressing Status Clean, Dry, Intact    Dressing Change Frequency PRN    Site / Wound Assessment  Clean;Dry    % Wound base Red or Granulating 60%   was 50   % Wound base Yellow/Fibrinous Exudate 40%    Wound Length (cm) 2.1 cm   was 2.6   Wound Width (cm) 1.2 cm   was 2.1   Wound Surface Area (cm^2) 2.52 cm^2    Margins Epibole (rolled edges)    Drainage Amount Minimal    Drainage Description Green;Serosanguineous    Treatment Cleansed;Debridement (Selective)    Wound Properties Date First Assessed: 07/15/23 Time First Assessed: 0955 Wound Type: Other (Comment) Location: Buttocks Location Orientation: Left Wound Description (Comments): More lateral of the two wounds Present on Admission: Yes   Dressing Type Honey;None    Dressing Status Old drainage    Dressing Change Frequency PRN    % Wound base Red or Granulating 50%   was 20   % Wound base Yellow/Fibrinous Exudate 50%    Wound Width (cm) 0.9 cm    Wound Depth (cm) 0.4 cm    Margins Epibole (rolled edges)    Treatment Cleansed;Debridement (Selective)    Selective Debridement (non-excisional) - Location wound bed; epiboled edges    Selective Debridement (non-excisional) - Tools Used Scalpel;Forceps    Selective Debridement (non-excisional) - Tissue Removed eschar, slough    Wound Therapy - Clinical Statement see below    Wound Therapy - Functional Problem List difficult to lie or sit    Factors Delaying/Impairing Wound Healing Altered sensation;Immobility;Multiple medical problems;Polypharmacy;Vascular compromise    Hydrotherapy Plan Debridement;Dressing change;Patient/family education    Wound Therapy - Frequency 2X / week    Wound Therapy - Current Recommendations PT    Wound Plan debridement., PT education and dressing    Dressing  Lt hip medihoney 2x2 and tegaderm    Dressing silverhydrofiber to wound beds, vaseline around wounds to fabrile tissue.  xeroform, 4x4 and  ab pad.                 PATIENT EDUCATION: Education details: Keep dressing clean and dry, if soiled or wet remove,  Keep off buttock try and be  Right side lying as much as possible.  Complete 10 glut sets every hour, increase protein intake.   Person educated: Patient Education method: Explanation Education comprehension: verbalized understanding   HOME EXERCISE PROGRAM: Evaluation:  Glut sets   Access Code: [email or web address] URL: [email or web address] Date: 07/26/2023 Prepared by: Emeline Gins  Exercises - Seated Long Arc Quad  - 2 x daily - 7 x weekly - 2 sets - 10 reps - 5 second hold - Seated March  - 2 x daily - 7 x weekly - 2 sets - 10 reps - 5 second hold -  Sit to Stand with Hands on Knees  - 2 x daily - 7 x weekly - 1 sets - 10 reps - Seated Heel Toe Raises  - 2 x daily - 7 x weekly - 1 sets - 10 reps   GOALS: Goals reviewed with patient? No  SHORT TERM GOALS: Target date: 08/05/23  Wounds to be 100% granulated to decrease risk of infection.  Baseline: Goal status: IN PROGRESS  2.  PT pain to be no greater than a 5/10 Baseline:  Goal status: MET  3.  PT wounds to be approximating.  Baseline:  Goal status: IN PROGRESS   LONG TERM GOALS: Target date: 08/26/23  Wounds to be healed.  Baseline:  Goal status: IN PROGRESS  2.  Pain in Lt buttock to be no greater than 2/10 Baseline:  Goal status: IN PROGRESS  ASSESSMENT:  CLINICAL IMPRESSION: Pt has no dressings on hip wound but buttock wounds dressing stayed on this treatment. .  Pt appears SOB O2 taken at 85.  Therapist worked with pt on diaphragm breathing for several minutes and was able to get O2 up to 95. Pt is completing diaphragm breathing as instructed. .  Therapist stressed the importance of keeping off wounds. Due to cancer , chronic kidney disease and sleep apnea as well as decreased activity tolerance pt wound are healing at a slower rate.  Pt will benefit from continued wound care  to prevent infection as he lives by himself and is unable to care for these wounds himself.    OBJECTIVE IMPAIRMENTS: decreased mobility, pain, and decreased  skin integrity  .   ACTIVITY LIMITATIONS: sitting, sleeping, bathing, toileting, dressing, and hygiene/grooming   PERSONAL FACTORS: Fitness, Time since onset of injury/illness/exacerbation, and 1 comorbidity: cancer  are also affecting patient's functional outcome.   REHAB POTENTIAL: Good  CLINICAL DECISION MAKING: Evolving/moderate complexity  EVALUATION COMPLEXITY: Moderate  PLAN: PT FREQUENCY: 2x/week  PT DURATION: 6 weeks; continue for 6 additional weeks    PLANNED INTERVENTIONS: 97110-Therapeutic exercises, 97535- Self Care, 09811- Wound care (first 20 sq cm), and debridement and dressing change   PLAN FOR NEXT SESSION: Cleanse, debride and continue to work with dressing to find a dressing that will stay intact to promote healing environment.    Virgina Organ, PT CLT [phone]  08/18/2023, 11:33 AM  Requesting 12 more visits beginning 08/27/23 [facility] Request  Referring diagnosis code (ICD 10)? Z30.865H Treatment diagnosis codes (ICD 10)? (if different than referring diagnosis)  S31.829S, M79.18,M25.512, S71.002S What was this (referring dx) caused by? []  Surgery []  Fall []  Ongoing issue []  Arthritis [x]  Other: ___multiple medical issues _________  Laterality: []  Rt [x]  Lt []  Both  Deficits: [x]  Pain []  Stiffness []  Weakness [x]  Edema []  Balance Deficits []  Coordination []  Gait Disturbance []  ROM [x]  Other wojnd     CPT codes: See Planned Interventions listed in the Plan section of the Evaluation.   97110-Therapeutic exercises, 97535- Self Care, 84696- Wound care (first 20 sq cm), and debridement and dressing change

## 2023-08-24 ENCOUNTER — Ambulatory Visit (HOSPITAL_COMMUNITY): Payer: Medicare PPO | Admitting: Physical Therapy

## 2023-08-24 DIAGNOSIS — M25552 Pain in left hip: Secondary | ICD-10-CM

## 2023-08-24 DIAGNOSIS — M7918 Myalgia, other site: Secondary | ICD-10-CM

## 2023-08-24 DIAGNOSIS — S31829S Unspecified open wound of left buttock, sequela: Secondary | ICD-10-CM | POA: Diagnosis not present

## 2023-08-24 DIAGNOSIS — S71002S Unspecified open wound, left hip, sequela: Secondary | ICD-10-CM

## 2023-08-24 NOTE — Therapy (Signed)
OUTPATIENT PHYSICAL THERAPY Wound TREATMENT   Patient Name: David [name] MRN: [id number] DOB:March 25, 1951, 72 y.o., male Today's Date: 08/24/2023   PCP: Dr. Magda Paganini PROVIDER: Doreatha Massed,  END OF SESSION:  PT End of Session - 08/24/23 0923     Visit Number 11    Number of Visits 24    Date for PT Re-Evaluation 10/07/23    Authorization Type [employer]/Medicaid    Authorization Time Period cohere approved 12 visits from 10/31-->08/26/23    Authorization - Visit Number 11    Authorization - Number of Visits 12    Progress Note Due on Visit 20    PT Start Time 0850    PT Stop Time 0925    PT Time Calculation (min) 35 min    Activity Tolerance Patient tolerated treatment well    Behavior During Therapy [facility] for tasks assessed/performed                Past Medical History:  Diagnosis Date   Chronic kidney disease    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 Ronal Fear note   Pneumonia    Seizure [facility])    Past Surgical History:  Procedure Laterality Date   COLONOSCOPY  05/31/2012   Procedure: COLONOSCOPY;  Surgeon: Dalia Heading, MD;  [facility];  Service: Gastroenterology;  Laterality: N/A;   PORTACATH PLACEMENT Right 01/27/2023   Procedure: INSERTION PORT-A-CATH, RIJ;  Surgeon: Lewie Chamber, DO;  Location: [facility] ORS;  Service: General;  Laterality: Right;   ROBOT ASSISTED LAPAROSCOPIC NEPHRECTOMY Left 08/13/2022   Procedure: XI ROBOTIC ASSISTED LAPAROSCOPIC NEPHRECTOMY;  Surgeon: Malen Gauze, MD;  Location: [facility] ORS;  Service: Urology;  Laterality: Left;   TONSILLECTOMY     Patient Active Problem List   Diagnosis Date Noted   Clear cell renal cell carcinoma, left (HCC) 01/12/2023   Abdominal adhesions 08/13/2022   AKI (acute kidney injury) (HCC) 07/23/2022   Renal mass 07/23/2022   Lung nodule 07/23/2022   Urinary retention 07/23/2022   BPH  (benign prostatic hyperplasia) 07/23/2022   Hepatitis C virus infection cured after antiviral drug therapy 07/13/2019   Elevated LFTs 07/13/2019   Seizure disorder (HCC) 01/14/2012   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    ONSET DATE: 03/30/23  REFERRING DIAG: Wound care for gludial fold  THERAPY DIAG:  Gluteal cleft wound, left, sequela  Gluteal pain  Pain in left hip  Complicated open wound of left hip, sequela  Rationale for Evaluation and Treatment: Rehabilitation     Wound Therapy - 08/24/23 0001     Subjective Pt has no complaints    Patient and Family Stated Goals wound to heal, pain to be gone.    Date of Onset 03/30/23    Prior Treatments self care, hh no longer getting HH    Pain Scale 0-10    Pain Score 3    when working with wound   Evaluation and Treatment Procedures Explained to Patient/Family Yes    Evaluation and Treatment Procedures agreed to    Wound Properties Date First Assessed: 07/15/23 Time First Assessed: 0920 Wound Type: Other (Comment) , pressure  Location: Hip Location Orientation: Left Present on Admission: Yes   Dressing Type None    Dressing Status None    Dressing Change Frequency PRN    Site / Wound Assessment Dry;Pink;Yellow    Margins Unattached edges (unapproximated)   was epiboled  Treatment Cleansed;Debridement (Selective)    Wound Properties Date First Assessed: 07/15/23 Location: Buttocks Wound Description (Comments): medial wound on Lt buttock Present on Admission: Yes   Dressing Type Gauze (Comment);Honey;None    Dressing Changed Changed    Dressing Status Clean, Dry, Intact    Dressing Change Frequency PRN    Site / Wound Assessment Clean;Dry    Margins Epibole (rolled edges)    Wound Properties Date First Assessed: 07/15/23 Time First Assessed: 0955 Wound Type: Other (Comment) Location: Buttocks Location Orientation: Left Wound Description (Comments): More lateral of the two wounds Present on Admission: Yes   Dressing Type  Honey;None    Dressing Status Old drainage    Dressing Change Frequency PRN    Margins Epibole (rolled edges)    Treatment Cleansed;Debridement (Selective)    Selective Debridement (non-excisional) - Location wound bed; epiboled edges    Selective Debridement (non-excisional) - Tools Used Scalpel;Forceps    Selective Debridement (non-excisional) - Tissue Removed eschar, slough    Wound Therapy - Clinical Statement see below    Wound Therapy - Functional Problem List difficult to lie or sit    Factors Delaying/Impairing Wound Healing Altered sensation;Immobility;Multiple medical problems;Polypharmacy;Vascular compromise    Hydrotherapy Plan Debridement;Dressing change;Patient/family education    Wound Therapy - Frequency 2X / week    Wound Therapy - Current Recommendations PT    Wound Plan debridement., PT education and dressing    Dressing  Lt hip medihoney ,4x4 and medipore tape    Dressing medihoney f/b silverhydrofiber to wound beds, vaseline around wounds to fabrile tissue.  , 4x4 and  ab pad. Foam cut in a C to try to alleve pressure                  PATIENT EDUCATION: Education details: Keep dressing clean and dry, if soiled or wet remove,  Keep off buttock try and be Right side lying as much as possible.  Complete 10 glut sets every hour, increase protein intake.   Person educated: Patient Education method: Explanation Education comprehension: verbalized understanding   HOME EXERCISE PROGRAM: Evaluation:  Glut sets   Access Code: [email or web address] URL: [email or web address] Date: 07/26/2023 Prepared by: Emeline Gins  Exercises - Seated Long Arc Quad  - 2 x daily - 7 x weekly - 2 sets - 10 reps - 5 second hold - Seated March  - 2 x daily - 7 x weekly - 2 sets - 10 reps - 5 second hold - Sit to Stand with Hands on Knees  - 2 x daily - 7 x weekly - 1 sets - 10 reps - Seated Heel Toe Raises  - 2 x daily - 7 x weekly - 1 sets - 10 reps   GOALS: Goals reviewed  with patient? No  SHORT TERM GOALS: Target date: 08/05/23  Wounds to be 100% granulated to decrease risk of infection.  Baseline: Goal status: IN PROGRESS  2.  PT pain to be no greater than a 5/10 Baseline:  Goal status: MET  3.  PT wounds to be approximating.  Baseline:  Goal status: IN PROGRESS   LONG TERM GOALS: Target date: 08/26/23  Wounds to be healed.  Baseline:  Goal status: IN PROGRESS  2.  Pain in Lt buttock to be no greater than 2/10 Baseline:  Goal status: IN PROGRESS  ASSESSMENT:  CLINICAL IMPRESSION: Pt has no dressings on hip wound but buttock wounds dressing stayed on this treatment. .  .  Therapist stressed the importance of  keeping off his buttock as skin integrity continues to decrease due to pressure.  Therapist attempted to use foam around wounds to keep pressure off. . Due to cancer , chronic kidney disease and sleep apnea as well as decreased activity tolerance pt wound are healing at a slower rate.  Pt will benefit from continued wound care  to prevent infection as he lives by himself and is unable to care for these wounds himself.    OBJECTIVE IMPAIRMENTS: decreased mobility, pain, and decreased skin integrity  .   ACTIVITY LIMITATIONS: sitting, sleeping, bathing, toileting, dressing, and hygiene/grooming   PERSONAL FACTORS: Fitness, Time since onset of injury/illness/exacerbation, and 1 comorbidity: cancer  are also affecting patient's functional outcome.   REHAB POTENTIAL: Good  CLINICAL DECISION MAKING: Evolving/moderate complexity  EVALUATION COMPLEXITY: Moderate  PLAN: PT FREQUENCY: 2x/week  PT DURATION: 6 weeks; continue for 6 additional weeks    PLANNED INTERVENTIONS: 97110-Therapeutic exercises, 97535- Self Care, 16109- Wound care (first 20 sq cm), and debridement and dressing change   PLAN FOR NEXT SESSION: Cleanse, debride and continue to work with dressing to find a dressing that will stay intact to promote healing environment.     Virgina Organ, PT CLT [phone]  08/24/2023, 9:28 AM

## 2023-08-25 ENCOUNTER — Telehealth (HOSPITAL_COMMUNITY): Payer: Self-pay | Admitting: Physical Therapy

## 2023-08-25 NOTE — Telephone Encounter (Signed)
Sister called regarding need for pressure relief pillow for recliner chair.  Link sent through [employer] and states they will get this ordered for him.  Lurena Nida, PTA/CLT [facility] Ph: [phone]

## 2023-08-26 ENCOUNTER — Ambulatory Visit (HOSPITAL_COMMUNITY): Payer: Medicare PPO | Admitting: Physical Therapy

## 2023-08-26 DIAGNOSIS — M25552 Pain in left hip: Secondary | ICD-10-CM | POA: Diagnosis not present

## 2023-08-26 DIAGNOSIS — S31829S Unspecified open wound of left buttock, sequela: Secondary | ICD-10-CM | POA: Diagnosis not present

## 2023-08-26 DIAGNOSIS — S71002S Unspecified open wound, left hip, sequela: Secondary | ICD-10-CM

## 2023-08-26 DIAGNOSIS — M7918 Myalgia, other site: Secondary | ICD-10-CM

## 2023-08-26 NOTE — Therapy (Signed)
OUTPATIENT PHYSICAL THERAPY Wound TREATMENT   Patient Name: David [name] MRN: [id number] DOB:[birth year], 72 y.o., male Today's Date: 08/26/2023   PCP: Dr. Magda Paganini PROVIDER: Doreatha Massed,  END OF SESSION:  PT End of Session - 08/26/23 0919     Visit Number 12    Number of Visits 24    Date for PT Re-Evaluation 10/07/23    Authorization Type [employer]/Medicaid    Authorization Time Period cohere approved 12 more visits from 12/16 to 1/25    Authorization - Visit Number 12    Authorization - Number of Visits 12    Progress Note Due on Visit 20    PT Start Time 0850    PT Stop Time 0915    PT Time Calculation (min) 25 min    Activity Tolerance Patient tolerated treatment well    Behavior During Therapy [facility] for tasks assessed/performed                Past Medical History:  Diagnosis Date   Chronic kidney disease    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 Ronal Fear note   Pneumonia    Seizure [facility])    Past Surgical History:  Procedure Laterality Date   COLONOSCOPY  05/31/2012   Procedure: COLONOSCOPY;  Surgeon: Dalia Heading, MD;  Location[facility];  Service: Gastroenterology;  Laterality: N/A;   PORTACATH PLACEMENT Right 01/27/2023   Procedure: INSERTION PORT-A-CATH, RIJ;  Surgeon: Lewie Chamber, DO;  Location: [facility] ORS;  Service: General;  Laterality: Right;   ROBOT ASSISTED LAPAROSCOPIC NEPHRECTOMY Left 08/13/2022   Procedure: XI ROBOTIC ASSISTED LAPAROSCOPIC NEPHRECTOMY;  Surgeon: Malen Gauze, MD;  Location: [facility] ORS;  Service: Urology;  Laterality: Left;   TONSILLECTOMY     Patient Active Problem List   Diagnosis Date Noted   Clear cell renal cell carcinoma, left (HCC) 01/12/2023   Abdominal adhesions 08/13/2022   AKI (acute kidney injury) (HCC) 07/23/2022   Renal mass 07/23/2022   Lung nodule 07/23/2022   Urinary retention 07/23/2022   BPH  (benign prostatic hyperplasia) 07/23/2022   Hepatitis C virus infection cured after antiviral drug therapy 07/13/2019   Elevated LFTs 07/13/2019   Seizure disorder (HCC) 01/14/2012   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    ONSET DATE: 03/30/23  REFERRING DIAG: Wound care for gludial fold  THERAPY DIAG:  Gluteal cleft wound, left, sequela  Gluteal pain  Pain in left hip  Complicated open wound of left hip, sequela  Rationale for Evaluation and Treatment: Rehabilitation     Wound Therapy - 08/26/23 0919     Subjective Pt has no complaints    Patient and Family Stated Goals wound to heal, pain to be gone.    Date of Onset 03/30/23    Prior Treatments self care, hh no longer getting HH    Evaluation and Treatment Procedures Explained to Patient/Family Yes    Evaluation and Treatment Procedures agreed to    Wound Properties Date First Assessed: 07/15/23 Time First Assessed: 0920 Wound Type: Other (Comment) , pressure  Location: Hip Location Orientation: Left Present on Admission: Yes   Wound Image Images linked: 1    Dressing Type None    Dressing Changed Changed    Dressing Status None    Dressing Change Frequency PRN    Site / Wound Assessment Dry;Pink;Yellow    % Wound base Red or Granulating 80%    %  Wound base Yellow/Fibrinous Exudate 20%    Wound Length (cm) 3.6 cm    Wound Width (cm) 1.4 cm    Wound Depth (cm) 0.2 cm    Wound Volume (cm^3) 1.01 cm^3    Wound Surface Area (cm^2) 5.04 cm^2    Margins Unattached edges (unapproximated)   was epiboled   Drainage Amount Minimal    Drainage Description Serosanguineous    Treatment Cleansed;Debridement (Selective)    Wound Properties Date First Assessed: 07/15/23 Location: Buttocks Wound Description (Comments): medial wound on Lt buttock Present on Admission: Yes   Wound Image Images linked: 1    Dressing Type Gauze (Comment)    Dressing Changed Changed    Dressing Status Clean, Dry, Intact    Dressing Change Frequency PRN     Site / Wound Assessment Friable    % Wound base Red or Granulating 60%    % Wound base Yellow/Fibrinous Exudate 40%    Peri-wound Assessment Maceration    Wound Length (cm) 2.2 cm    Wound Width (cm) 1.6 cm    Wound Depth (cm) 0.1 cm    Wound Volume (cm^3) 0.35 cm^3    Wound Surface Area (cm^2) 3.52 cm^2    Undermining (cm) 0.2 inferior border    Margins Epibole (rolled edges)    Drainage Amount Minimal    Drainage Description Serosanguineous;Green    Treatment Cleansed;Debridement (Selective)    Wound Properties Date First Assessed: 07/15/23 Time First Assessed: 0955 Wound Type: Other (Comment) Location: Buttocks Location Orientation: Left Wound Description (Comments): More lateral of the two wounds Present on Admission: Yes   Dressing Type Gauze (Comment)    Dressing Changed Changed    Dressing Status Old drainage    Dressing Change Frequency PRN    Site / Wound Assessment Friable    % Wound base Red or Granulating 50%    % Wound base Yellow/Fibrinous Exudate 50%    Peri-wound Assessment Maceration    Wound Length (cm) 2 cm    Wound Width (cm) 1.2 cm    Wound Surface Area (cm^2) 2.4 cm^2    Margins Epibole (rolled edges)    Drainage Amount Minimal    Drainage Description Serosanguineous;Green    Treatment Cleansed;Debridement (Selective)    Selective Debridement (non-excisional) - Location wound bed; epiboled edges    Selective Debridement (non-excisional) - Tools Used Scalpel;Forceps    Selective Debridement (non-excisional) - Tissue Removed eschar, slough    Wound Therapy - Clinical Statement see below    Wound Therapy - Functional Problem List difficult to lie or sit    Factors Delaying/Impairing Wound Healing Altered sensation;Immobility;Multiple medical problems;Polypharmacy;Vascular compromise    Hydrotherapy Plan Debridement;Dressing change;Patient/family education    Wound Therapy - Frequency 2X / week    Wound Therapy - Current Recommendations PT    Wound Plan  debridement., PT education and dressing    Dressing  Lt hip medihoney ,4x4, ABD, medipore tape    Dressing Lt buttock: silverhydrofiber to wound beds, vaseline around wounds to friable tissue.  4x4 and  ab pad. Foam cut in a C to reduce pressure                  PATIENT EDUCATION: Education details: Keep dressing clean and dry, if soiled or wet remove,  Keep off buttock try and be Right side lying as much as possible.  Complete 10 glut sets every hour, increase protein intake.   Person educated: Patient Education method: Explanation Education comprehension: verbalized understanding  HOME EXERCISE PROGRAM: Evaluation:  Glut sets   Access Code: [email or web address] URL: [email or web address] Date: 07/26/2023 Prepared by: Emeline Gins  Exercises - Seated Long Arc Quad  - 2 x daily - 7 x weekly - 2 sets - 10 reps - 5 second hold - Seated March  - 2 x daily - 7 x weekly - 2 sets - 10 reps - 5 second hold - Sit to Stand with Hands on Knees  - 2 x daily - 7 x weekly - 1 sets - 10 reps - Seated Heel Toe Raises  - 2 x daily - 7 x weekly - 1 sets - 10 reps   GOALS: Goals reviewed with patient? No  SHORT TERM GOALS: Target date: 08/05/23  Wounds to be 100% granulated to decrease risk of infection.  Baseline: Goal status: IN PROGRESS  2.  PT pain to be no greater than a 5/10 Baseline:  Goal status: MET  3.  PT wounds to be approximating.  Baseline:  Goal status: IN PROGRESS   LONG TERM GOALS: Target date: 08/26/23  Wounds to be healed.  Baseline:  Goal status: IN PROGRESS  2.  Pain in Lt buttock to be no greater than 2/10 Baseline:  Goal status: IN PROGRESS  ASSESSMENT:  CLINICAL IMPRESSION: Pt comes today with dressings displaced, however still intact covering each wound.  Buttock wounds are macerated and Lt hip wound is covered in slough.  Cleansed and debrided all wounds.  All were photographed and placed in media as well as measured.  Per measurements,  all wounds are larger today than was measured last week.  Pt is having a difficult time keeping the pressure off these areas as he is mostly sedentary.  Therapist called sister yesterday and she is ordering him a donut to place in his recliner to see if this helps some.  Changed dressing to medihoney on hip to help loosen slough and silver on buttock to help absorb moisture/drainage.       OBJECTIVE IMPAIRMENTS: decreased mobility, pain, and decreased skin integrity  .   ACTIVITY LIMITATIONS: sitting, sleeping, bathing, toileting, dressing, and hygiene/grooming   PERSONAL FACTORS: Fitness, Time since onset of injury/illness/exacerbation, and 1 comorbidity: cancer  are also affecting patient's functional outcome.   REHAB POTENTIAL: Good  CLINICAL DECISION MAKING: Evolving/moderate complexity  EVALUATION COMPLEXITY: Moderate  PLAN: PT FREQUENCY: 2x/week  PT DURATION: 6 weeks; continue for 6 additional weeks    PLANNED INTERVENTIONS: 97110-Therapeutic exercises, 97535- Self Care, 22025- Wound care (first 20 sq cm), and debridement and dressing change   PLAN FOR NEXT SESSION: Cleanse, debride and continue to work with dressing to find a dressing that will stay intact to promote healing environment.    Lurena Nida, PTA/CLT [facility] Ph: [phone] 08/26/2023, 3:14 PM

## 2023-08-30 ENCOUNTER — Encounter (HOSPITAL_COMMUNITY): Payer: Self-pay

## 2023-08-30 ENCOUNTER — Ambulatory Visit (HOSPITAL_COMMUNITY): Payer: Medicare PPO

## 2023-08-30 DIAGNOSIS — M25552 Pain in left hip: Secondary | ICD-10-CM | POA: Diagnosis not present

## 2023-08-30 DIAGNOSIS — S71002S Unspecified open wound, left hip, sequela: Secondary | ICD-10-CM

## 2023-08-30 DIAGNOSIS — M7918 Myalgia, other site: Secondary | ICD-10-CM | POA: Diagnosis not present

## 2023-08-30 DIAGNOSIS — S31829S Unspecified open wound of left buttock, sequela: Secondary | ICD-10-CM | POA: Diagnosis not present

## 2023-08-30 NOTE — Therapy (Signed)
OUTPATIENT PHYSICAL THERAPY Wound TREATMENT   Patient Name: David [name] MRN: [id number] DOB:Feb 04, 1951, 72 y.o., male Today's Date: 08/30/2023   PCP: Dr. Magda Paganini PROVIDER: Doreatha Massed,  END OF SESSION:  PT End of Session - 08/30/23 1214     Visit Number 13    Number of Visits 24    Date for PT Re-Evaluation 10/07/23    Authorization Type [employer]/Medicaid    Authorization Time Period cohere approved 12 more visits from 12/16 to 1/25    Authorization - Visit Number 1    Authorization - Number of Visits 12    Progress Note Due on Visit 20    PT Start Time 0935    PT Stop Time 1013    PT Time Calculation (min) 38 min    Activity Tolerance Patient tolerated treatment well    Behavior During Therapy [facility] for tasks assessed/performed                Past Medical History:  Diagnosis Date   Chronic kidney disease    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 Ronal Fear note   Pneumonia    Seizure [facility])    Past Surgical History:  Procedure Laterality Date   COLONOSCOPY  05/31/2012   Procedure: COLONOSCOPY;  Surgeon: Dalia Heading, MD;  [facility];  Service: Gastroenterology;  Laterality: N/A;   PORTACATH PLACEMENT Right 01/27/2023   Procedure: INSERTION PORT-A-CATH, RIJ;  Surgeon: Lewie Chamber, DO;  Location: [facility] ORS;  Service: General;  Laterality: Right;   ROBOT ASSISTED LAPAROSCOPIC NEPHRECTOMY Left 08/13/2022   Procedure: XI ROBOTIC ASSISTED LAPAROSCOPIC NEPHRECTOMY;  Surgeon: Malen Gauze, MD;  Location: [facility] ORS;  Service: Urology;  Laterality: Left;   TONSILLECTOMY     Patient Active Problem List   Diagnosis Date Noted   Clear cell renal cell carcinoma, left (HCC) 01/12/2023   Abdominal adhesions 08/13/2022   AKI (acute kidney injury) (HCC) 07/23/2022   Renal mass 07/23/2022   Lung nodule 07/23/2022   Urinary retention 07/23/2022   BPH  (benign prostatic hyperplasia) 07/23/2022   Hepatitis C virus infection cured after antiviral drug therapy 07/13/2019   Elevated LFTs 07/13/2019   Seizure disorder (HCC) 01/14/2012   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    ONSET DATE: 03/30/23  REFERRING DIAG: Wound care for gludial fold  THERAPY DIAG:  Gluteal cleft wound, left, sequela  Gluteal pain  Pain in left hip  Complicated open wound of left hip, sequela  Rationale for Evaluation and Treatment: Rehabilitation     Wound Therapy - 08/30/23 0001     Subjective Pt arrived with dressings intact, reports of intermittent burning.  Stated doughnut arrived yesterday.    Patient and Family Stated Goals wound to heal, pain to be gone.    Date of Onset 03/30/23    Prior Treatments self care, hh no longer getting HH    Pain Scale 0-10    Pain Score 0-No pain    Evaluation and Treatment Procedures Explained to Patient/Family Yes    Evaluation and Treatment Procedures agreed to    Wound Properties Date First Assessed: 07/15/23 Time First Assessed: 0920 Wound Type: Other (Comment) , pressure  Location: Hip Location Orientation: Left Present on Admission: Yes   Wound Image Images linked: 1    Dressing Type None    Dressing Changed Changed    Dressing Status Old drainage  Dressing Change Frequency PRN    Site / Wound Assessment Dry;Pink;Yellow    % Wound base Red or Granulating 80%    % Wound base Yellow/Fibrinous Exudate 20%    Margins Epibole (rolled edges)    Drainage Amount Minimal    Drainage Description Serosanguineous    Treatment Cleansed;Debridement (Selective)    Wound Properties Date First Assessed: 07/15/23 Location: Buttocks Wound Description (Comments): medial wound on Lt buttock Present on Admission: Yes   Wound Image Images linked: 1    Dressing Type Gauze (Comment)    Dressing Changed Changed    Dressing Status Clean, Dry, Intact    Dressing Change Frequency PRN    Site / Wound Assessment Friable    % Wound  base Red or Granulating 60%    % Wound base Yellow/Fibrinous Exudate 40%    Peri-wound Assessment Maceration    Margins Epibole (rolled edges)    Drainage Amount Minimal    Drainage Description Serosanguineous    Treatment Cleansed;Debridement (Selective)    Wound Properties Date First Assessed: 07/15/23 Time First Assessed: 0955 Wound Type: Other (Comment) Location: Buttocks Location Orientation: Left Wound Description (Comments): More lateral of the two wounds Present on Admission: Yes   Dressing Type Gauze (Comment)    Dressing Changed Changed    Dressing Status Old drainage    Dressing Change Frequency PRN    Site / Wound Assessment Friable    % Wound base Red or Granulating 50%    % Wound base Yellow/Fibrinous Exudate 50%    Selective Debridement (non-excisional) - Location wound bed; epiboled edges    Selective Debridement (non-excisional) - Tools Used Scalpel;Forceps    Selective Debridement (non-excisional) - Tissue Removed eschar, slough    Wound Therapy - Clinical Statement see below    Wound Therapy - Functional Problem List difficult to lie or sit    Factors Delaying/Impairing Wound Healing Altered sensation;Immobility;Multiple medical problems;Polypharmacy;Vascular compromise    Hydrotherapy Plan Debridement;Dressing change;Patient/family education    Wound Therapy - Frequency 2X / week    Wound Therapy - Current Recommendations PT    Wound Therapy - Follow Up Recommendations Other (comment)    Wound Plan debridement., PT education and dressing    Dressing  Lt hip medihoney ,4x4, ABD, medipore tape    Dressing Lt buttock: silverhydrofiber to wound beds, vaseline around wounds to friable tissue.  4x4 and  ab pad. Foam cut in a C to reduce pressure                  PATIENT EDUCATION: Education details: Keep dressing clean and dry, if soiled or wet remove,  Keep off buttock try and be Right side lying as much as possible.  Complete 10 glut sets every hour, increase  protein intake.   Person educated: Patient Education method: Explanation Education comprehension: verbalized understanding   HOME EXERCISE PROGRAM: Evaluation:  Glut sets   Access Code: [email or web address] URL: [email or web address] Date: 07/26/2023 Prepared by: Emeline Gins  Exercises - Seated Long Arc Quad  - 2 x daily - 7 x weekly - 2 sets - 10 reps - 5 second hold - Seated March  - 2 x daily - 7 x weekly - 2 sets - 10 reps - 5 second hold - Sit to Stand with Hands on Knees  - 2 x daily - 7 x weekly - 1 sets - 10 reps - Seated Heel Toe Raises  - 2 x daily - 7 x weekly - 1 sets - 10  reps   GOALS: Goals reviewed with patient? No  SHORT TERM GOALS: Target date: 08/05/23  Wounds to be 100% granulated to decrease risk of infection.  Baseline: Goal status: IN PROGRESS  2.  PT pain to be no greater than a 5/10 Baseline:  Goal status: MET  3.  PT wounds to be approximating.  Baseline:  Goal status: IN PROGRESS   LONG TERM GOALS: Target date: 08/26/23  Wounds to be healed.  Baseline:  Goal status: IN PROGRESS  2.  Pain in Lt buttock to be no greater than 2/10 Baseline:  Goal status: IN PROGRESS  ASSESSMENT:  CLINICAL IMPRESSION: Lt buttocks presents with increased devitalized tissues surround wound.  Reviewed importance of pressure relief to promote healing.  Pt reports he has donut now at home.  Selective debridement complete to remove slough and devitalized tissue to promote healing. Continued with silverhydrofiber to assist with macerated tissue and address drainage.    OBJECTIVE IMPAIRMENTS: decreased mobility, pain, and decreased skin integrity  .   ACTIVITY LIMITATIONS: sitting, sleeping, bathing, toileting, dressing, and hygiene/grooming   PERSONAL FACTORS: Fitness, Time since onset of injury/illness/exacerbation, and 1 comorbidity: cancer  are also affecting patient's functional outcome.   REHAB POTENTIAL: Good  CLINICAL DECISION MAKING:  Evolving/moderate complexity  EVALUATION COMPLEXITY: Moderate  PLAN: PT FREQUENCY: 2x/week  PT DURATION: 6 weeks; continue for 6 additional weeks    PLANNED INTERVENTIONS: 97110-Therapeutic exercises, 97535- Self Care, 16109- Wound care (first 20 sq cm), and debridement and dressing change   PLAN FOR NEXT SESSION: Cleanse, debride and continue to work with dressing to find a dressing that will stay intact to promote healing environment.    Becky Sax, LPTA/CLT; CBIS [phone]  Juel Burrow, PTA 08/30/2023, 12:15 PM  08/30/2023, 12:15 PM

## 2023-09-01 ENCOUNTER — Ambulatory Visit: Payer: Medicare PPO | Admitting: Hematology

## 2023-09-01 ENCOUNTER — Encounter (HOSPITAL_COMMUNITY): Payer: Self-pay

## 2023-09-01 ENCOUNTER — Other Ambulatory Visit: Payer: Medicare PPO

## 2023-09-01 ENCOUNTER — Ambulatory Visit (HOSPITAL_COMMUNITY): Payer: Medicare PPO

## 2023-09-01 DIAGNOSIS — S71002S Unspecified open wound, left hip, sequela: Secondary | ICD-10-CM | POA: Diagnosis not present

## 2023-09-01 DIAGNOSIS — M7918 Myalgia, other site: Secondary | ICD-10-CM

## 2023-09-01 DIAGNOSIS — M25552 Pain in left hip: Secondary | ICD-10-CM

## 2023-09-01 DIAGNOSIS — S31829S Unspecified open wound of left buttock, sequela: Secondary | ICD-10-CM

## 2023-09-01 NOTE — Therapy (Signed)
OUTPATIENT PHYSICAL THERAPY Wound TREATMENT   Patient Name: David [name] MRN: [id number] DOB:[birth year], 72 y.o., male Today's Date: 09/01/2023   PCP: Dr. Magda Paganini PROVIDER: Doreatha Massed,  END OF SESSION:  PT End of Session - 09/01/23 1013     Visit Number 14    Number of Visits 24    Date for PT Re-Evaluation 10/07/23    Authorization Type [employer]/Medicaid    Authorization Time Period cohere approved 12 more visits from 12/16 to 1/25    Authorization - Visit Number 2    Authorization - Number of Visits 12    Progress Note Due on Visit 20    PT Start Time 0932    PT Stop Time 1012    PT Time Calculation (min) 40 min    Activity Tolerance Patient tolerated treatment well    Behavior During Therapy [facility] for tasks assessed/performed                Past Medical History:  Diagnosis Date   Chronic kidney disease    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 Ronal Fear note   Pneumonia    Seizure [facility])    Past Surgical History:  Procedure Laterality Date   COLONOSCOPY  05/31/2012   Procedure: COLONOSCOPY;  Surgeon: Dalia Heading, MD;  Location[facility];  Service: Gastroenterology;  Laterality: N/A;   PORTACATH PLACEMENT Right 01/27/2023   Procedure: INSERTION PORT-A-CATH, RIJ;  Surgeon: Lewie Chamber, DO;  Location: [facility] ORS;  Service: General;  Laterality: Right;   ROBOT ASSISTED LAPAROSCOPIC NEPHRECTOMY Left 08/13/2022   Procedure: XI ROBOTIC ASSISTED LAPAROSCOPIC NEPHRECTOMY;  Surgeon: Malen Gauze, MD;  Location: [facility];  Service: Urology;  Laterality: Left;   TONSILLECTOMY     Patient Active Problem List   Diagnosis Date Noted   Clear cell renal cell carcinoma, left (HCC) 01/12/2023   Abdominal adhesions 08/13/2022   AKI (acute kidney injury) (HCC) 07/23/2022   Renal mass 07/23/2022   Lung nodule 07/23/2022   Urinary retention 07/23/2022   BPH  (benign prostatic hyperplasia) 07/23/2022   Hepatitis C virus infection cured after antiviral drug therapy 07/13/2019   Elevated LFTs 07/13/2019   Seizure disorder (HCC) 01/14/2012   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    ONSET DATE: 03/30/23  REFERRING DIAG: Wound care for gludial fold  THERAPY DIAG:  Gluteal cleft wound, left, sequela  Gluteal pain  Pain in left hip  Complicated open wound of left hip, sequela  Rationale for Evaluation and Treatment: Rehabilitation     Wound Therapy - 09/01/23 0001     Subjective Reports he is feeling good today, no reports of pain.    Patient and Family Stated Goals wound to heal, pain to be gone.    Date of Onset 03/30/23    Prior Treatments self care, hh no longer getting HH    Pain Scale 0-10    Pain Score 0-No pain    Evaluation and Treatment Procedures Explained to Patient/Family Yes    Evaluation and Treatment Procedures agreed to    Wound Properties Date First Assessed: 07/15/23 Time First Assessed: 0920 Wound Type: Other (Comment) , pressure  Location: Hip Location Orientation: Left Present on Admission: Yes   Wound Image Images linked: 1    Dressing Type None    Dressing Changed Changed    Dressing Status Old drainage    Dressing Change Frequency PRN  Site / Wound Assessment Dry;Pink;Yellow    % Wound base Red or Granulating 80%    % Wound base Yellow/Fibrinous Exudate 20%    Wound Length (cm) 3.8 cm    Wound Width (cm) 1.8 cm    Wound Depth (cm) 0.2 cm    Wound Volume (cm^3) 1.37 cm^3    Wound Surface Area (cm^2) 6.84 cm^2    Margins Unattached edges (unapproximated)    Drainage Amount Minimal    Drainage Description Serosanguineous    Treatment Cleansed;Debridement (Selective)    Wound Properties Date First Assessed: 07/15/23 Location: Buttocks Wound Description (Comments): medial wound on Lt buttock Present on Admission: Yes   Wound Image Images linked: 1    Dressing Type Gauze (Comment)    Dressing Changed Changed     Dressing Status Clean, Dry, Intact    Dressing Change Frequency PRN    Site / Wound Assessment Friable    % Wound base Red or Granulating 60%    % Wound base Yellow/Fibrinous Exudate 40%    Wound Length (cm) 2.2 cm    Wound Width (cm) 1.6 cm    Wound Depth (cm) 0.1 cm    Wound Volume (cm^3) 0.35 cm^3    Wound Surface Area (cm^2) 3.52 cm^2    Margins Epibole (rolled edges)    Drainage Amount Minimal    Drainage Description Serosanguineous    Treatment Cleansed;Debridement (Selective)    Wound Properties Date First Assessed: 07/15/23 Time First Assessed: 0955 Wound Type: Other (Comment) Location: Buttocks Location Orientation: Left Wound Description (Comments): More lateral of the two wounds Present on Admission: Yes   Dressing Type Gauze (Comment)    Dressing Changed Changed    Dressing Status Old drainage    Dressing Change Frequency PRN    Site / Wound Assessment Friable    % Wound base Red or Granulating 50%    % Wound base Yellow/Fibrinous Exudate 50%    Wound Length (cm) 2.2 cm    Wound Width (cm) 1.6 cm    Wound Surface Area (cm^2) 3.52 cm^2    Margins Epibole (rolled edges)    Drainage Amount Minimal    Drainage Description Serosanguineous    Treatment Cleansed;Debridement (Selective)    Selective Debridement (non-excisional) - Location wound bed; epiboled edges    Selective Debridement (non-excisional) - Tools Used Scalpel;Forceps    Selective Debridement (non-excisional) - Tissue Removed eschar, slough    Wound Therapy - Clinical Statement see below    Wound Therapy - Functional Problem List difficult to lie or sit    Factors Delaying/Impairing Wound Healing Altered sensation;Immobility;Multiple medical problems;Polypharmacy;Vascular compromise    Hydrotherapy Plan Debridement;Dressing change;Patient/family education    Wound Therapy - Frequency 2X / week    Wound Therapy - Current Recommendations PT    Wound Therapy - Follow Up Recommendations Other (comment)     Wound Plan debridement., PT education and dressing    Dressing  Lt hip medihoney ,4x4, ABD, medipore tape    Dressing Lt buttock: silverhydrofiber to wound beds, vaseline around wounds to friable tissue.  4x4 and  ab pad. Foam cut in a C to reduce pressure                   PATIENT EDUCATION: Education details: Keep dressing clean and dry, if soiled or wet remove,  Keep off buttock try and be Right side lying as much as possible.  Complete 10 glut sets every hour, increase protein intake.   Person educated:  Patient Education method: Explanation Education comprehension: verbalized understanding   HOME EXERCISE PROGRAM: Evaluation:  Glut sets   Access Code: [email or web address] URL: [email or web address] Date: 07/26/2023 Prepared by: Emeline Gins  Exercises - Seated Long Arc Quad  - 2 x daily - 7 x weekly - 2 sets - 10 reps - 5 second hold - Seated March  - 2 x daily - 7 x weekly - 2 sets - 10 reps - 5 second hold - Sit to Stand with Hands on Knees  - 2 x daily - 7 x weekly - 1 sets - 10 reps - Seated Heel Toe Raises  - 2 x daily - 7 x weekly - 1 sets - 10 reps   GOALS: Goals reviewed with patient? No  SHORT TERM GOALS: Target date: 08/05/23  Wounds to be 100% granulated to decrease risk of infection.  Baseline: Goal status: IN PROGRESS  2.  PT pain to be no greater than a 5/10 Baseline:  Goal status: MET  3.  PT wounds to be approximating.  Baseline:  Goal status: IN PROGRESS   LONG TERM GOALS: Target date: 08/26/23  Wounds to be healed.  Baseline:  Goal status: IN PROGRESS  2.  Pain in Lt buttock to be no greater than 2/10 Baseline:  Goal status: IN PROGRESS  ASSESSMENT:  CLINICAL IMPRESSION: Improved skin integrity surrounding wound.  Selective debridement for removal of adherent slough wound bed and devitalized tissue to promote healing.  Continued with medihoney on Lt hip to address adherent slough and silverhydrofiber Lt buttock to address  drainage.  Reports of comfort at EOS.  OBJECTIVE IMPAIRMENTS: decreased mobility, pain, and decreased skin integrity  .   ACTIVITY LIMITATIONS: sitting, sleeping, bathing, toileting, dressing, and hygiene/grooming   PERSONAL FACTORS: Fitness, Time since onset of injury/illness/exacerbation, and 1 comorbidity: cancer  are also affecting patient's functional outcome.   REHAB POTENTIAL: Good  CLINICAL DECISION MAKING: Evolving/moderate complexity  EVALUATION COMPLEXITY: Moderate  PLAN: PT FREQUENCY: 2x/week  PT DURATION: 6 weeks; continue for 6 additional weeks    PLANNED INTERVENTIONS: 97110-Therapeutic exercises, 97535- Self Care, 19147- Wound care (first 20 sq cm), and debridement and dressing change   PLAN FOR NEXT SESSION: Cleanse, debride and continue to work with dressing to find a dressing that will stay intact to promote healing environment.    Becky Sax, LPTA/CLT; CBIS [phone]  Juel Burrow, PTA 09/01/2023, 11:23 AM  09/01/2023, 11:23 AM

## 2023-09-02 ENCOUNTER — Other Ambulatory Visit: Payer: Self-pay

## 2023-09-02 NOTE — Progress Notes (Signed)
Specialty Pharmacy Refill Coordination Note  David [name] is a 72 y.o. male contacted today regarding refills of specialty medication(s) Cabozantinib S-Malate (Cabometyx)   Patient requested Delivery   Delivery date: 09/09/23   Verified address: [address]   [place] [address]   Medication will be filled on 09/07/23.

## 2023-09-06 ENCOUNTER — Other Ambulatory Visit: Payer: Self-pay

## 2023-09-06 ENCOUNTER — Inpatient Hospital Stay (HOSPITAL_BASED_OUTPATIENT_CLINIC_OR_DEPARTMENT_OTHER): Payer: Medicare PPO | Admitting: Hematology

## 2023-09-06 ENCOUNTER — Inpatient Hospital Stay: Payer: Medicare PPO | Attending: Hematology

## 2023-09-06 VITALS — BP 116/74 | HR 120 | Temp 97.0°F | Resp 20 | Wt 198.0 lb

## 2023-09-06 DIAGNOSIS — C642 Malignant neoplasm of left kidney, except renal pelvis: Secondary | ICD-10-CM

## 2023-09-06 DIAGNOSIS — I129 Hypertensive chronic kidney disease with stage 1 through stage 4 chronic kidney disease, or unspecified chronic kidney disease: Secondary | ICD-10-CM | POA: Diagnosis not present

## 2023-09-06 DIAGNOSIS — Z452 Encounter for adjustment and management of vascular access device: Secondary | ICD-10-CM | POA: Insufficient documentation

## 2023-09-06 DIAGNOSIS — Z905 Acquired absence of kidney: Secondary | ICD-10-CM | POA: Diagnosis not present

## 2023-09-06 DIAGNOSIS — D649 Anemia, unspecified: Secondary | ICD-10-CM | POA: Insufficient documentation

## 2023-09-06 DIAGNOSIS — N189 Chronic kidney disease, unspecified: Secondary | ICD-10-CM | POA: Diagnosis not present

## 2023-09-06 DIAGNOSIS — C7972 Secondary malignant neoplasm of left adrenal gland: Secondary | ICD-10-CM | POA: Insufficient documentation

## 2023-09-06 LAB — COMPREHENSIVE METABOLIC PANEL
ALT: 29 U/L (ref 0–44)
AST: 38 U/L (ref 15–41)
Albumin: 3.4 g/dL — ABNORMAL LOW (ref 3.5–5.0)
Alkaline Phosphatase: 91 U/L (ref 38–126)
Anion gap: 15 (ref 5–15)
BUN: 12 mg/dL (ref 8–23)
CO2: 16 mmol/L — ABNORMAL LOW (ref 22–32)
Calcium: 8.5 mg/dL — ABNORMAL LOW (ref 8.9–10.3)
Chloride: 103 mmol/L (ref 98–111)
Creatinine, Ser: 1.2 mg/dL (ref 0.61–1.24)
GFR, Estimated: >60 mL/min (ref >60)
Glucose, Bld: 155 mg/dL — ABNORMAL HIGH (ref 70–99)
Potassium: 3.3 mmol/L — ABNORMAL LOW (ref 3.5–5.1)
Sodium: 134 mmol/L — ABNORMAL LOW (ref 135–145)
Total Bilirubin: 1 mg/dL (ref <1.2)
Total Protein: 6.5 g/dL (ref 6.5–8.1)

## 2023-09-06 LAB — CBC WITH DIFFERENTIAL/PLATELET
Abs Immature Granulocytes: 0.3 10*3/uL — ABNORMAL HIGH (ref 0.00–0.07)
Band Neutrophils: 1 %
Basophils Absolute: 0 10*3/uL (ref 0.0–0.1)
Basophils Relative: 0 %
Eosinophils Absolute: 0.1 10*3/uL (ref 0.0–0.5)
Eosinophils Relative: 1 %
HCT: 33.4 % — ABNORMAL LOW (ref 39.0–52.0)
Hemoglobin: 10.6 g/dL — ABNORMAL LOW (ref 13.0–17.0)
Lymphocytes Relative: 73 %
Lymphs Abs: 7.4 10*3/uL — ABNORMAL HIGH (ref 0.7–4.0)
MCH: 31.1 pg (ref 26.0–34.0)
MCHC: 31.7 g/dL (ref 30.0–36.0)
MCV: 97.9 fL (ref 80.0–100.0)
Metamyelocytes Relative: 1 %
Monocytes Absolute: 0.5 10*3/uL (ref 0.1–1.0)
Monocytes Relative: 5 %
Myelocytes: 1 %
Neutro Abs: 1.8 10*3/uL (ref 1.7–7.7)
Neutrophils Relative %: 17 %
Platelets: 215 10*3/uL (ref 150–400)
Promyelocytes Relative: 1 %
RBC: 3.41 MIL/uL — ABNORMAL LOW (ref 4.22–5.81)
RDW: 25.2 % — ABNORMAL HIGH (ref 11.5–15.5)
WBC: 10.2 10*3/uL (ref 4.0–10.5)
nRBC: 0.3 % — ABNORMAL HIGH (ref 0.0–0.2)

## 2023-09-06 LAB — MAGNESIUM: Magnesium: 1.9 mg/dL (ref 1.7–2.4)

## 2023-09-06 MED ORDER — SODIUM CHLORIDE 0.9% FLUSH
10.0000 mL | Freq: Once | INTRAVENOUS | Status: AC
  Administered 2023-09-06: 10 mL via INTRAVENOUS

## 2023-09-06 MED ORDER — HEPARIN SOD (PORK) LOCK FLUSH 100 UNIT/ML IV SOLN
500.0000 [IU] | Freq: Once | INTRAVENOUS | Status: AC
  Administered 2023-09-06: 500 [IU] via INTRAVENOUS

## 2023-09-06 MED ORDER — MEGESTROL ACETATE 400 MG/10ML PO SUSP: 400.0000 mg | Freq: Two times a day (BID) | ORAL | 3 refills | Status: DC

## 2023-09-06 NOTE — Progress Notes (Signed)
Patients port flushed without difficulty. Good blood return noted with no bruising or swelling noted at site. Band aid applied. VSS with discharge and left in satisfactory condition with no s/s of distress noted. All follow ups as scheduled.  David [name] [employer]

## 2023-09-06 NOTE — Progress Notes (Signed)
Patient is taking Cabometyx as prescribed.  He has not missed any doses and reports no side effects at this time.

## 2023-09-06 NOTE — Progress Notes (Signed)
[facility] [address] [place] [address]    Clinic Day:  09/06/2023  Referring physician: Assunta Found, MD  Patient Care Team: Assunta Found, MD as PCP - General (Family Medicine) Doreatha Massed, MD as Medical Oncologist (Medical Oncology) Therese Sarah, RN as Oncology Nurse Navigator (Medical Oncology)   ASSESSMENT & PLAN:   Assessment: 1.  Metastatic left kidney clear-cell RCC: - Presentation with left shoulder pain.  Weight loss of 50 pounds in the last 6 months but was trying to lose weight by cutting back on eating and doing some walking. - Left robotic assisted laparoscopic radical nephrectomy by Dr. Ronne Binning on 08/13/2022 - Pathology: 7 cm clear-cell RCC with focal sarcomatoid and rhabdoid features, nuclear grade 4.  Tumor extends into the renal vein and renal sinus fat (PT3a).  Ureteral, vascular and all margins negative for tumor. - PET scan (12/24/2022): Enlarging hypermetabolic left adrenal metastasis.  2 small hypermetabolic soft tissue nodules in the left nephrectomy bed.  Hypermetabolic lytic lesions involving distal left clavicle and left temporal bone.  Indeterminate small hypermetabolic parotid nodules bilaterally. - IMDC: At least intermediate pending labs - Lenvatinib 14 mg daily started on 01/18/2023, Keytruda on 01/22/2023, dose decreased to 10 mg on 01/28/2023 due to elevated blood pressure, discontinued due to progression on PET scan on 05/06/2023 - Cabozantinib 40 mg daily started on 06/24/2023   2.  Social/family history: - Lives by himself and is independent of ADLs and IADLs.  He does not drive.  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  Current active smoker, half pack per day, started at age 82. - Mother might have had cancer, he is not sure.    Plan: 1.  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 - Cabozantinib was started on 06/24/2023. - He has occasional diarrhea.  No other significant side effects. - Reviewed labs today: Normal LFTs with albumin 3.4.  Electrolytes are normal.  CBC was grossly normal with normocytic anemia, hemoglobin 10.6. - Recommend continuing cabozantinib 40 mg daily.  RTC 4 weeks for follow-up.  Will plan on repeating PET scan prior to next visit.   2.  Hypertension: - Continue amlodipine 10 mg daily.  Lisinopril is on hold.  Blood pressure today is 116/74.   3.  Epidermal necrolysis: - He has an ulcer along the left lateral waistline and 2 shallow ulcers in the upper part of the gluteal cleft. - Continue follow-up with wound care.  4.  Weight loss: - He reports decrease in appetite.  He lost about 20 pounds since October.  He is eating 1-2 meals per day. - Recommend starting boost high-protein 1 can per day. - Will start him on Megace 400 mg twice daily.    No orders of the defined types were placed in this encounter.     Doreatha Massed, MD   12/23/20243:07 PM  CHIEF COMPLAINT:   Diagnosis: metastatic clear cell renal cell carcinoma    Cancer Staging  Clear cell renal cell carcinoma, left [facility]) Staging form: Kidney, AJCC 8th Edition - Clinical stage from 01/12/2023: Stage IV (cT3a, cNX, cM1) - Unsigned    Prior Therapy:  1. left nephrectomy 08/13/22  2. Lenvatinib and pembrolizumab, 01/18/23 - 06/21/23  Current Therapy:  cabozantinib    HISTORY OF PRESENT ILLNESS:   Oncology History  Clear cell renal cell  carcinoma, left (HCC)  01/12/2023 Initial Diagnosis   Clear cell renal cell carcinoma, left (HCC)   01/22/2023 -  Chemotherapy   Patient is on Treatment Plan : KIDNEY Pembrolizumab (200) q21d        INTERVAL HISTORY:   Coit is a 72 y.o. male seen for follow-up of metastatic clear-cell renal cell carcinoma.  He is accompanied by his sister.  Today he states that he is doing reasonably  well.  However his appetite has decreased to 25% and energy levels are also low.  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 Ronal Fear note   Pneumonia    Seizure [facility])     Surgical History: Past Surgical History:  Procedure Laterality Date   COLONOSCOPY  05/31/2012   Procedure: COLONOSCOPY;  Surgeon: Dalia Heading, MD;  Location: [facility] ENDO SUITE;  Service: Gastroenterology;  Laterality: N/A;   PORTACATH PLACEMENT Right 01/27/2023   Procedure: INSERTION PORT-A-CATH, RIJ;  Surgeon: Lewie Chamber, DO;  Location: [facility] ORS;  Service: General;  Laterality: Right;   ROBOT ASSISTED LAPAROSCOPIC NEPHRECTOMY Left 08/13/2022   Procedure: XI ROBOTIC ASSISTED LAPAROSCOPIC NEPHRECTOMY;  Surgeon: Malen Gauze, MD;  Location: [facility] ORS;  Service: Urology;  Laterality: Left;   TONSILLECTOMY      Social History: Social History   Socioeconomic History   Marital status: Divorced    Spouse name: Not on file   Number of children: Not on file   Years of education: Not on file   Highest education level: Not on file  Occupational History   Not on file  Tobacco Use   Smoking status: Every Day    Current packs/day: 0.50    Average packs/day: 0.5 packs/day for 65.0 years (32.5 ttl pk-yrs)    Types: Cigarettes   Smokeless tobacco: Never  Vaping Use   Vaping status: Never Used  Substance and Sexual Activity   Alcohol use: No   Drug use: No   Sexual activity: Not Currently  Other Topics Concern   Not on file  Social History Narrative   Not on file   Social Drivers of Health   Financial Resource Strain: Low Risk  (03/24/2023)   Received from [facility]   Overall Financial Resource Strain (CARDIA)    Difficulty of Paying Living Expenses: Not hard at all  Recent Concern: Financial Resource Strain -  High Risk (02/01/2023)   Overall Financial Resource Strain (CARDIA)    Difficulty of Paying Living Expenses: Very hard  Food Insecurity: No Food Insecurity (03/24/2023)   Received from [facility]   Hunger Vital Sign    Worried About Running Out of Food in the Last Year: Never true    Ran Out of Food in the Last Year: Never true  Transportation Needs: No Transportation Needs (03/24/2023)   [facility] - Transportation    Lack of Transportation (Medical): No    Lack of Transportation (Non-Medical): No  Physical Activity: Not on file  Stress: Patient Declined (03/07/2023)   Received from [facility], [facility] - Occupational Stress Questionnaire    Feeling of Stress : Patient declined  Social Connections: Unknown (03/07/2023)   Received from [facility], [facility]   Social Network  Social Network: Not on file  Intimate Partner Violence: Not At Risk (03/08/2023)   Received from [facility], [facility]   HITS    Over the last 12 months how often did your partner physically hurt you?: Never    Over the last 12 months how often did your partner insult you or talk down to you?: Never    Over the last 12 months how often did your partner threaten you with physical harm?: Never    Over the last 12 months how often did your partner scream or curse at you?: Never    Family History: No family history on file.  Current Medications:  Current Outpatient Medications:    amLODipine (NORVASC) 10 MG tablet, Take 1 tablet (10 mg total) by mouth daily., Disp: 30 tablet, Rfl: 6   cabozantinib (CABOMETYX) 40 MG tablet, Take 1 tablet (40 mg total) by mouth daily. Take on an empty stomach, 1 hour before or 2 hours after meals., Disp: 30 tablet, Rfl: 3   Clobetasol Prop Emollient Base (CLOBETASOL PROPIONATE E) 0.05 % emollient cream, Apply 1 Application topically 2 (two) times daily., Disp: 30 g, Rfl: 0   levETIRAcetam  (KEPPRA) 500 MG tablet, Take by mouth., Disp: , Rfl:    lidocaine (XYLOCAINE) 5 % ointment, Apply 1 Application topically 2 (two) times daily as needed for moderate pain., Disp: 35.44 g, Rfl: 0   lidocaine-prilocaine (EMLA) cream, Apply to affected area once, Disp: 30 g, Rfl: 3   lisinopril (ZESTRIL) 10 MG tablet, Take by mouth., Disp: , Rfl:    prochlorperazine (COMPAZINE) 10 MG tablet, Take 1 tablet (10 mg total) by mouth every 6 (six) hours as needed for nausea or vomiting., Disp: 30 tablet, Rfl: 1   tamsulosin (FLOMAX) 0.4 MG CAPS capsule, Take 0.4 mg by mouth daily. , Disp: , Rfl:    triamcinolone cream (KENALOG) 0.1 %, SMARTSIG:1 Application Topical 2-3 Times Daily, Disp: , Rfl:    Pembrolizumab (KEYTRUDA IV), Inject into the vein every 21 ( twenty-one) days., Disp: , Rfl:    Allergies: No Known Allergies  REVIEW OF SYSTEMS:   Review of Systems  Constitutional:  Negative for chills, fatigue and fever.  HENT:   Negative for lump/mass, mouth sores, nosebleeds, sore throat and trouble swallowing.   Eyes:  Negative for eye problems.  Respiratory:  Positive for cough and shortness of breath.   Cardiovascular:  Negative for chest pain, leg swelling and palpitations.  Gastrointestinal:  Positive for diarrhea. Negative for abdominal pain, constipation, nausea and vomiting.  Genitourinary:  Negative for bladder incontinence, difficulty urinating, dysuria, frequency, hematuria and nocturia.   Musculoskeletal:  Negative for arthralgias, back pain, flank pain, myalgias and neck pain.  Skin:  Negative for itching and rash.  Neurological:  Positive for numbness. Negative for dizziness and headaches.  Hematological:  Does not bruise/bleed easily.  Psychiatric/Behavioral:  Negative for depression, sleep disturbance and suicidal ideas. The patient is not nervous/anxious.   All other systems reviewed and are negative.    VITALS:   Blood pressure 116/74, pulse (!) 120, temperature (!) 97 F (36.1  C), temperature source Oral, resp. rate 20, weight 198 lb (89.8 kg), SpO2 100%.  Wt Readings from Last 3 Encounters:  09/06/23 198 lb (89.8 kg)  07/12/23 219 lb 9.6 oz (99.6 kg)  06/21/23 220 lb (99.8 kg)    Body mass index is 27.23 kg/m.  Performance status (ECOG): 1 - Symptomatic but completely ambulatory  PHYSICAL EXAM:   Physical Exam  Vitals and nursing note reviewed. Exam conducted with a chaperone present.  Constitutional:      Appearance: Normal appearance.  Cardiovascular:     Rate and Rhythm: Normal rate and regular rhythm.     Pulses: Normal pulses.     Heart sounds: Normal heart sounds.  Pulmonary:     Effort: Pulmonary effort is normal.     Breath sounds: Normal breath sounds.  Abdominal:     Palpations: Abdomen is soft. There is no hepatomegaly, splenomegaly or mass.     Tenderness: There is no abdominal tenderness.  Musculoskeletal:     Right lower leg: No edema.     Left lower leg: No edema.  Lymphadenopathy:     Cervical: No cervical adenopathy.     Right cervical: No superficial, deep or posterior cervical adenopathy.    Left cervical: No superficial, deep or posterior cervical adenopathy.     Upper Body:     Right upper body: No supraclavicular or axillary adenopathy.     Left upper body: No supraclavicular or axillary adenopathy.  Neurological:     General: No focal deficit present.     Mental Status: He is alert and oriented to person, place, and time.  Psychiatric:        Mood and Affect: Mood normal.        Behavior: Behavior normal.     LABS:      Latest Ref Rng & Units 07/12/2023    9:49 AM 06/21/2023    2:34 PM 05/12/2023    9:38 AM  CBC  WBC 4.0 - 10.5 K/uL 5.8  5.5  4.5   Hemoglobin 13.0 - 17.0 g/dL 16.1  09.6  8.5   Hematocrit 39.0 - 52.0 % 36.7  32.2  25.6   Platelets 150 - 400 K/uL 167  199  217       Latest Ref Rng & Units 09/06/2023    2:03 PM 07/12/2023    9:49 AM 06/21/2023    2:34 PM  CMP  Glucose 70 - 99 mg/dL 045  409   811   BUN 8 - 23 mg/dL 12  18  23    Creatinine 0.61 - 1.24 mg/dL 9.14  7.82  9.56   Sodium 135 - 145 mmol/L 134  134  134   Potassium 3.5 - 5.1 mmol/L 3.3  4.2  3.8   Chloride 98 - 111 mmol/L 103  102  105   CO2 22 - 32 mmol/L 16  22  20    Calcium 8.9 - 10.3 mg/dL 8.5  8.6  8.6   Total Protein 6.5 - 8.1 g/dL 6.5  6.7  7.0   Total Bilirubin <1.2 mg/dL 1.0  0.5  0.7   Alkaline Phos 38 - 126 U/L 91  122  106   AST 15 - 41 U/L 38  35  20   ALT 0 - 44 U/L 29  29  18       No results found for: "CEA1", "CEA" / No results found for: "CEA1", "CEA" No results found for: "PSA1" No results found for: "OZH086" No results found for: "CAN125"  No results found for: "TOTALPROTELP", "ALBUMINELP", "A1GS", "A2GS", "BETS", "BETA2SER", "GAMS", "MSPIKE", "SPEI" Lab Results  Component Value Date   TIBC 221 (L) 06/21/2023   TIBC 250 01/20/2023   TIBC 271 04/03/2009   FERRITIN 759 (H) 06/21/2023   FERRITIN 655 (H) 01/20/2023   FERRITIN 870 (H) 04/03/2009   IRONPCTSAT 18 06/21/2023   IRONPCTSAT 26  01/20/2023   IRONPCTSAT 72 (H) 04/03/2009   Lab Results  Component Value Date   LDH 248 (H) 01/20/2023     STUDIES:   No results found.

## 2023-09-06 NOTE — Patient Instructions (Addendum)
[facility] at [facility] Discharge Instructions   You were seen and examined today by Dr. Ellin Saba.  He reviewed the results of your lab work which are normal/stable.   You have lost 20 lbs since October. We will send in a liquid appetite stimulant called Megace. You will take 2 teaspoons twice a day.   Dr Kirtland Bouchard recommends that you drink at least one Boost or Ensure high protein daily. Get the ones that have 360 calories per can.   Continue Cabometyx as prescribed.   We will see you back in 4 weeks . We will repeat lab work and a scan prior to this visit.   Return as scheduled.    Thank you for choosing [facility] at [facility] to provide your oncology and hematology care.  To afford each patient quality time with our provider, please arrive at least 15 minutes before your scheduled appointment time.   If you have a lab appointment with the Cancer Center please come in thru the Main Entrance and check in at the main information desk.  You need to re-schedule your appointment should you arrive 10 or more minutes late.  We strive to give you quality time with our providers, and arriving late affects you and other patients whose appointments are after yours.  Also, if you no show three or more times for appointments you may be dismissed from the clinic at the providers discretion.     Again, thank you for choosing [facility].  Our hope is that these requests will decrease the amount of time that you wait before being seen by our physicians.       _____________________________________________________________  Should you have questions after your visit to [facility], please contact our office at [phone] and follow the prompts.  Our office hours are 8:00 a.m. and 4:30 p.m. Monday - Friday.  Please note that voicemails left after 4:00 p.m. may not be returned until the following business day.  We are closed weekends  and major holidays.  You do have access to a nurse 24-7, just call the main number to the clinic [phone] and do not press any options, hold on the line and a nurse will answer the phone.    For prescription refill requests, have your pharmacy contact our office and allow 72 hours.    Due to Covid, you will need to wear a mask upon entering the hospital. If you do not have a mask, a mask will be given to you at the Main Entrance upon arrival. For doctor visits, patients may have 1 support person age 42 or older with them. For treatment visits, patients can not have anyone with them due to social distancing guidelines and our immunocompromised population.

## 2023-09-07 ENCOUNTER — Other Ambulatory Visit: Payer: Self-pay

## 2023-09-16 ENCOUNTER — Encounter (HOSPITAL_COMMUNITY): Payer: Self-pay

## 2023-09-16 ENCOUNTER — Ambulatory Visit (HOSPITAL_COMMUNITY): Payer: Medicare PPO | Attending: Hematology

## 2023-09-16 DIAGNOSIS — M25552 Pain in left hip: Secondary | ICD-10-CM | POA: Insufficient documentation

## 2023-09-16 DIAGNOSIS — S71002S Unspecified open wound, left hip, sequela: Secondary | ICD-10-CM | POA: Diagnosis not present

## 2023-09-16 DIAGNOSIS — M7918 Myalgia, other site: Secondary | ICD-10-CM | POA: Insufficient documentation

## 2023-09-16 DIAGNOSIS — S31829S Unspecified open wound of left buttock, sequela: Secondary | ICD-10-CM | POA: Insufficient documentation

## 2023-09-16 NOTE — Therapy (Signed)
 OUTPATIENT PHYSICAL THERAPY Wound TREATMENT   Patient Name: David [name] MRN: [id number] DOB:Sep 09, 1951, 73 y.o., male Today's Date: 09/16/2023   PCP: Dr. Marvine MART PROVIDER: Rogers Hai,  END OF SESSION:  PT End of Session - 09/16/23 1048     Visit Number 15    Number of Visits 24    Date for PT Re-Evaluation 10/07/23    Authorization Type [employer]/Medicaid    Authorization Time Period cohere approved 12 more visits from 12/16 to 1/25    Authorization - Visit Number 3    Authorization - Number of Visits 12    Progress Note Due on Visit 20    PT Start Time 0847    PT Stop Time 0928    PT Time Calculation (min) 41 min    Activity Tolerance Patient tolerated treatment well    Behavior During Therapy [facility] for tasks assessed/performed                Past Medical History:  Diagnosis Date   Chronic kidney disease    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 Vinson note   Pneumonia    Seizure [facility])    Past Surgical History:  Procedure Laterality Date   COLONOSCOPY  05/31/2012   Procedure: COLONOSCOPY;  Surgeon: Oneil DELENA Budge, MD;  Location: [facility];  Service: Gastroenterology;  Laterality: N/A;   PORTACATH PLACEMENT Right 01/27/2023   Procedure: INSERTION PORT-A-CATH, RIJ;  Surgeon: Evonnie Dorothyann DELENA, DO;  Location: [facility] ORS;  Service: General;  Laterality: Right;   ROBOT ASSISTED LAPAROSCOPIC NEPHRECTOMY Left 08/13/2022   Procedure: XI ROBOTIC ASSISTED LAPAROSCOPIC NEPHRECTOMY;  Surgeon: Sherrilee Belvie CROME, MD;  Location: [facility] ORS;  Service: Urology;  Laterality: Left;   TONSILLECTOMY     Patient Active Problem List   Diagnosis Date Noted   Clear cell renal cell carcinoma, left (HCC) 01/12/2023   Abdominal adhesions 08/13/2022   AKI (acute kidney injury) (HCC) 07/23/2022   Renal mass 07/23/2022   Lung nodule 07/23/2022   Urinary retention 07/23/2022   BPH (benign  prostatic hyperplasia) 07/23/2022   Hepatitis C virus infection cured after antiviral drug therapy 07/13/2019   Elevated LFTs 07/13/2019   Seizure disorder (HCC) 01/14/2012   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    ONSET DATE: 03/30/23  REFERRING DIAG: Wound care for gludial fold  THERAPY DIAG:  Gluteal cleft wound, left, sequela  Gluteal pain  Pain in left hip  Rationale for Evaluation and Treatment: Rehabilitation     Wound Therapy - 09/16/23 0001     Subjective Pt's last apt on 09/01/23.  Arrived with no dressings on wounds and reports of increased pain today.    Patient and Family Stated Goals wound to heal, pain to be gone.    Date of Onset 03/30/23    Prior Treatments self care, hh no longer getting HH    Pain Scale 0-10    Pain Score 8     Pain Location Buttocks    Pain Orientation Left    Pain Descriptors / Indicators Aching;Burning;Discomfort    Pain Onset On-going    Pain Intervention(s) Repositioned;Emotional support   cleansed and moistured   Wound Properties Date First Assessed: 07/15/23 Time First Assessed: 0920 Wound Type: Other (Comment) , pressure  Location: Hip Location Orientation: Left Present on Admission: Yes   Wound Image Images linked: 1    Dressing Type None  Dressing Changed New    Dressing Status --   no dressings on, unable to assess   Dressing Change Frequency PRN    Site / Wound Assessment Dry;Pink;Yellow    % Wound base Red or Granulating 55%    % Wound base Yellow/Fibrinous Exudate 45%    Wound Length (cm) 5.3 cm    Wound Width (cm) 2 cm    Wound Depth (cm) 0.2 cm    Wound Volume (cm^3) 2.12 cm^3    Wound Surface Area (cm^2) 10.6 cm^2    Margins Unattached edges (unapproximated)    Drainage Amount --   unable to assess with dressings on, dry wound bed   Treatment Cleansed;Debridement (Selective)    Wound Properties Date First Assessed: 07/15/23 Location: Buttocks Wound Description (Comments): medial wound on Lt buttock Present on  Admission: Yes   Wound Image Images linked: 1    Dressing Type Gauze (Comment)    Dressing Changed New    Dressing Status Dry    Dressing Change Frequency PRN    Site / Wound Assessment Friable    % Wound base Red or Granulating 45%    % Wound base Yellow/Fibrinous Exudate 55%    Wound Length (cm) 2.3 cm    Wound Width (cm) 1.6 cm    Wound Depth (cm) 0.1 cm    Wound Volume (cm^3) 0.37 cm^3    Wound Surface Area (cm^2) 3.68 cm^2    Undermining (cm) --   .2 inferior border   Margins Epibole (rolled edges)    Drainage Amount Minimal    Drainage Description Serosanguineous    Treatment Cleansed;Debridement (Selective)    Wound Properties Date First Assessed: 07/15/23 Time First Assessed: 0955 Wound Type: Other (Comment) Location: Buttocks Location Orientation: Left Wound Description (Comments): More lateral of the two wounds Present on Admission: Yes   Dressing Type Gauze (Comment)    Dressing Changed New    Dressing Status Dry    Dressing Change Frequency PRN    Site / Wound Assessment Friable    % Wound base Red or Granulating 45%    % Wound base Yellow/Fibrinous Exudate 55%    Wound Length (cm) 2.2 cm    Wound Width (cm) 1.6 cm    Wound Depth (cm) 0.4 cm    Wound Volume (cm^3) 1.41 cm^3    Wound Surface Area (cm^2) 3.52 cm^2    Margins Epibole (rolled edges)    Drainage Amount Minimal    Drainage Description Serosanguineous    Treatment Cleansed;Debridement (Selective)    Selective Debridement (non-excisional) - Location wound bed; epiboled edges    Selective Debridement (non-excisional) - Tools Used Scalpel;Forceps    Selective Debridement (non-excisional) - Tissue Removed eschar, slough    Wound Therapy - Clinical Statement see below    Wound Therapy - Functional Problem List difficult to lie or sit    Factors Delaying/Impairing Wound Healing Altered sensation;Immobility;Multiple medical problems;Polypharmacy;Vascular compromise    Hydrotherapy Plan Debridement;Dressing  change;Patient/family education    Wound Therapy - Frequency 2X / week    Wound Therapy - Current Recommendations PT    Wound Therapy - Follow Up Recommendations Other (comment)    Wound Plan debridement., PT education and dressing    Dressing  Lt hip medihoney ,4x4, ABD, medipore tape    Dressing Lt buttocks: most lateral wound xeroform, medial wounds medihoney alginate, 4x4, medipore tape                   PATIENT EDUCATION:  Education details: Keep dressing clean and dry, if soiled or wet remove,  Keep off buttock try and be Right side lying as much as possible.  Complete 10 glut sets every hour, increase protein intake.   Person educated: Patient Education method: Explanation Education comprehension: verbalized understanding   HOME EXERCISE PROGRAM: Evaluation:  Glut sets   Access Code: [email or web address] URL: [email or web address] Date: 07/26/2023 Prepared by: Greig Fuse  Exercises - Seated Long Arc Quad  - 2 x daily - 7 x weekly - 2 sets - 10 reps - 5 second hold - Seated March  - 2 x daily - 7 x weekly - 2 sets - 10 reps - 5 second hold - Sit to Stand with Hands on Knees  - 2 x daily - 7 x weekly - 1 sets - 10 reps - Seated Heel Toe Raises  - 2 x daily - 7 x weekly - 1 sets - 10 reps   GOALS: Goals reviewed with patient? No  SHORT TERM GOALS: Target date: 08/05/23  Wounds to be 100% granulated to decrease risk of infection.  Baseline: Goal status: IN PROGRESS  2.  PT pain to be no greater than a 5/10 Baseline:  Goal status: MET  3.  PT wounds to be approximating.  Baseline:  Goal status: IN PROGRESS   LONG TERM GOALS: Target date: 08/26/23  Wounds to be healed.  Baseline:  Goal status: IN PROGRESS  2.  Pain in Lt buttock to be no greater than 2/10 Baseline:  Goal status: IN PROGRESS  ASSESSMENT:  CLINICAL IMPRESSION: Pt arrived 15 days since last session, no dressings on wounds and reports of increased pain today.  Pt educated he is  responsible for getting wound care apts weekly.  Educated importance of keeping wound covered to reduce risk of infection and bacteria getting into wound.  Wound beds very dry.  Selective debridement for removal of slough from wound bed and dry edges to promote healing.  Noted new wound on lateral aspect of Lt buttocks measuring W.6x L.8.  Changed dressings to medihoney alginate to address drywound bed.  Encouraged to keep wound covered at all times.    OBJECTIVE IMPAIRMENTS: decreased mobility, pain, and decreased skin integrity  .   ACTIVITY LIMITATIONS: sitting, sleeping, bathing, toileting, dressing, and hygiene/grooming   PERSONAL FACTORS: Fitness, Time since onset of injury/illness/exacerbation, and 1 comorbidity: cancer  are also affecting patient's functional outcome.   REHAB POTENTIAL: Good  CLINICAL DECISION MAKING: Evolving/moderate complexity  EVALUATION COMPLEXITY: Moderate  PLAN: PT FREQUENCY: 2x/week  PT DURATION: 6 weeks; continue for 6 additional weeks    PLANNED INTERVENTIONS: 97110-Therapeutic exercises, 97535- Self Care, 02402- Wound care (first 20 sq cm), and debridement and dressing change   PLAN FOR NEXT SESSION: Cleanse, debride and continue to work with dressing to find a dressing that will stay intact to promote healing environment.    Augustin Mclean, LPTA/CLT; CBIS [phone]  Mclean Augustin Amble, PTA 09/16/2023, 10:55 AM  09/16/2023, 10:55 AM

## 2023-09-20 ENCOUNTER — Encounter (HOSPITAL_COMMUNITY): Payer: Self-pay

## 2023-09-20 ENCOUNTER — Ambulatory Visit (HOSPITAL_COMMUNITY): Payer: Medicare PPO

## 2023-09-20 DIAGNOSIS — M25552 Pain in left hip: Secondary | ICD-10-CM | POA: Diagnosis not present

## 2023-09-20 DIAGNOSIS — S31829S Unspecified open wound of left buttock, sequela: Secondary | ICD-10-CM

## 2023-09-20 DIAGNOSIS — S71002S Unspecified open wound, left hip, sequela: Secondary | ICD-10-CM | POA: Diagnosis not present

## 2023-09-20 DIAGNOSIS — M7918 Myalgia, other site: Secondary | ICD-10-CM

## 2023-09-20 NOTE — Therapy (Signed)
 OUTPATIENT PHYSICAL THERAPY Wound TREATMENT   Patient Name: David [name] MRN: [id number] DOB:[birth year], 73 y.o., male Today's Date: 09/20/2023   PCP: Dr. Marvine MART PROVIDER: Rogers Hai,  END OF SESSION:  PT End of Session - 09/20/23 1014     Visit Number 16    Number of Visits 24    Date for PT Re-Evaluation 10/07/23    Authorization Type [employer]/Medicaid    Authorization Time Period cohere approved 12 more visits from 12/16 to 1/25    Authorization - Visit Number 4    Authorization - Number of Visits 12    Progress Note Due on Visit 20    PT Start Time 0928    PT Stop Time 0958    PT Time Calculation (min) 30 min    Activity Tolerance Patient tolerated treatment well    Behavior During Therapy [facility] for tasks assessed/performed                Past Medical History:  Diagnosis Date   Chronic kidney disease    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 Vinson note   Pneumonia    Seizure [facility])    Past Surgical History:  Procedure Laterality Date   COLONOSCOPY  05/31/2012   Procedure: COLONOSCOPY;  Surgeon: Oneil DELENA Budge, MD;  [facility];  Service: Gastroenterology;  Laterality: N/A;   PORTACATH PLACEMENT Right 01/27/2023   Procedure: INSERTION PORT-A-CATH, RIJ;  Surgeon: Evonnie Dorothyann DELENA, DO;  Location: [facility] OR[facility];  Service: General;  Laterality: Right;   ROBOT ASSISTED LAPAROSCOPIC NEPHRECTOMY Left 08/13/2022   Procedure: XI ROBOTIC ASSISTED LAPAROSCOPIC NEPHRECTOMY;  Surgeon: Sherrilee Belvie CROME, MD;  Location: [facility];  Service: Urology;  Laterality: Left;   TONSILLECTOMY     Patient Active Problem List   Diagnosis Date Noted   Clear cell renal cell carcinoma, left (HCC) 01/12/2023   Abdominal adhesions 08/13/2022   AKI (acute kidney injury) (HCC) 07/23/2022   Renal mass 07/23/2022   Lung nodule 07/23/2022   Urinary retention 07/23/2022   BPH (benign  prostatic hyperplasia) 07/23/2022   Hepatitis C virus infection cured after antiviral drug therapy 07/13/2019   Elevated LFTs 07/13/2019   Seizure disorder (HCC) 01/14/2012   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    ONSET DATE: 03/30/23  REFERRING DIAG: Wound care for gludial fold  THERAPY DIAG:  Gluteal cleft wound, left, sequela  Gluteal pain  Rationale for Evaluation and Treatment: Rehabilitation     Wound Therapy - 09/20/23 0001     Subjective Pt reports a fall on driveway.  Arrived with dressings intact, though wound visible on Lt hip    Patient and Family Stated Goals wound to heal, pain to be gone.    Date of Onset 03/30/23    Prior Treatments self care, hh no longer getting HH    Pain Scale 0-10    Pain Score 0-No pain    Wound Properties Date First Assessed: 07/15/23 Time First Assessed: 0920 Wound Type: Other (Comment) , pressure  Location: Hip Location Orientation: Left Present on Admission: Yes   Wound Image Images linked: 1    Dressing Type Gauze (Comment)   vaseline perimeter, medihoney, 2x2, tegaderm   Dressing Changed Changed    Dressing Status Old drainage    Dressing Change Frequency PRN    Site / Wound Assessment Dry;Pink;Yellow    % Wound base Red or Granulating 60%    %  Wound base Yellow/Fibrinous Exudate 40%    Margins Unattached edges (unapproximated)    Drainage Amount Scant    Drainage Description Serosanguineous    Treatment Cleansed;Debridement (Selective)    Wound Properties Date First Assessed: 07/15/23 Location: Buttocks Wound Description (Comments): medial wound on Lt buttock Present on Admission: Yes   Wound Image Images linked: 1    Dressing Type Gauze (Comment)   medihoney alginate, 2x2, medipore tape   Dressing Changed Changed    Dressing Status Old drainage;Dry    Dressing Change Frequency PRN    Site / Wound Assessment Friable    % Wound base Red or Granulating 50%    % Wound base Yellow/Fibrinous Exudate 50%    Wound Properties Date  First Assessed: 07/15/23 Time First Assessed: 0955 Wound Type: Other (Comment) Location: Buttocks Location Orientation: Left Wound Description (Comments): More lateral of the two wounds Present on Admission: Yes   Dressing Type Gauze (Comment)   medihoney alginate, 2x2, medipore tape   Dressing Changed Changed    Dressing Status Old drainage    Dressing Change Frequency PRN    Site / Wound Assessment Friable    % Wound base Red or Granulating 45%    % Wound base Yellow/Fibrinous Exudate 55%    Margins Epibole (rolled edges)    Drainage Amount Minimal    Drainage Description Serosanguineous    Treatment Cleansed;Debridement (Selective)    Selective Debridement (non-excisional) - Location wound bed; epiboled edges    Selective Debridement (non-excisional) - Tools Used Scalpel;Forceps    Selective Debridement (non-excisional) - Tissue Removed eschar, slough    Wound Therapy - Clinical Statement see below    Wound Therapy - Functional Problem List difficult to lie or sit    Factors Delaying/Impairing Wound Healing Altered sensation;Immobility;Multiple medical problems;Polypharmacy;Vascular compromise    Hydrotherapy Plan Debridement;Dressing change;Patient/family education    Wound Therapy - Frequency 2X / week    Wound Therapy - Current Recommendations PT    Wound Therapy - Follow Up Recommendations Other (comment)    Wound Plan debridement., PT education and dressing    Dressing  Lt hip medihoney ,4x4, ABD, tegaderm    Dressing Lt buttocks: medihoney alginate, 4x4, medipore tape                   PATIENT EDUCATION: Education details: Keep dressing clean and dry, if soiled or wet remove,  Keep off buttock try and be Right side lying as much as possible.  Complete 10 glut sets every hour, increase protein intake.   Person educated: Patient Education method: Explanation Education comprehension: verbalized understanding   HOME EXERCISE PROGRAM: Evaluation:  Glut sets    Access Code: [email or web address] URL: [email or web address] Date: 07/26/2023 Prepared by: Greig Fuse  Exercises - Seated Long Arc Quad  - 2 x daily - 7 x weekly - 2 sets - 10 reps - 5 second hold - Seated March  - 2 x daily - 7 x weekly - 2 sets - 10 reps - 5 second hold - Sit to Stand with Hands on Knees  - 2 x daily - 7 x weekly - 1 sets - 10 reps - Seated Heel Toe Raises  - 2 x daily - 7 x weekly - 1 sets - 10 reps   GOALS: Goals reviewed with patient? No  SHORT TERM GOALS: Target date: 08/05/23  Wounds to be 100% granulated to decrease risk of infection.  Baseline: Goal status: IN PROGRESS  2.  PT pain to be  no greater than a 5/10 Baseline:  Goal status: MET  3.  PT wounds to be approximating.  Baseline:  Goal status: IN PROGRESS   LONG TERM GOALS: Target date: 08/26/23  Wounds to be healed.  Baseline:  Goal status: IN PROGRESS  2.  Pain in Lt buttock to be no greater than 2/10 Baseline:  Goal status: IN PROGRESS  ASSESSMENT:  CLINICAL IMPRESSION: Hip wound bandage continues to partially removed, changed dressings to tegaderm.  Wound beds are not as dry either this session.  Selective debridement to address slough on wound bed and dry skin perimeter to promote healing.  Continued with medihoney on Lt hip and medihoney alginate on buttocks.  No reports of pain through session.      OBJECTIVE IMPAIRMENTS: decreased mobility, pain, and decreased skin integrity  .   ACTIVITY LIMITATIONS: sitting, sleeping, bathing, toileting, dressing, and hygiene/grooming   PERSONAL FACTORS: Fitness, Time since onset of injury/illness/exacerbation, and 1 comorbidity: cancer  are also affecting patient's functional outcome.   REHAB POTENTIAL: Good  CLINICAL DECISION MAKING: Evolving/moderate complexity  EVALUATION COMPLEXITY: Moderate  PLAN: PT FREQUENCY: 2x/week  PT DURATION: 6 weeks; continue for 6 additional weeks    PLANNED INTERVENTIONS: 97110-Therapeutic  exercises, 97535- Self Care, 02402- Wound care (first 20 sq cm), and debridement and dressing change   PLAN FOR NEXT SESSION: Cleanse, debride and continue to work with dressing to find a dressing that will stay intact to promote healing environment.    Augustin Mclean, LPTA/CLT; CBIS [phone]  Mclean Augustin Amble, PTA 09/20/2023, 10:15 AM  09/20/2023, 10:15 AM

## 2023-09-23 ENCOUNTER — Ambulatory Visit (HOSPITAL_COMMUNITY): Payer: Medicare PPO | Admitting: Physical Therapy

## 2023-09-23 DIAGNOSIS — M7918 Myalgia, other site: Secondary | ICD-10-CM | POA: Diagnosis not present

## 2023-09-23 DIAGNOSIS — M25552 Pain in left hip: Secondary | ICD-10-CM

## 2023-09-23 DIAGNOSIS — S31829S Unspecified open wound of left buttock, sequela: Secondary | ICD-10-CM | POA: Diagnosis not present

## 2023-09-23 DIAGNOSIS — S71002S Unspecified open wound, left hip, sequela: Secondary | ICD-10-CM | POA: Diagnosis not present

## 2023-09-23 NOTE — Therapy (Addendum)
 OUTPATIENT PHYSICAL THERAPY Wound TREATMENT   Patient Name: David [name] MRN: [id number] DOB:07-May-1951, 73 y.o., male Today's Date: 09/23/2023   PCP: Dr. Marvine MART PROVIDER: Rogers Hai,  END OF SESSION:  PT End of Session - 09/23/23 1130     Visit Number 17    Number of Visits 24    Date for PT Re-Evaluation 10/07/23    Authorization Type [employer]/Medicaid    Authorization Time Period cohere approved 12 more visits from 12/16 to 1/25    Authorization - Visit Number 5    Authorization - Number of Visits 12    Progress Note Due on Visit 20    PT Start Time 0940    PT Stop Time 1015    PT Time Calculation (min) 35 min    Activity Tolerance Patient tolerated treatment well    Behavior During Therapy [facility] for tasks assessed/performed                Past Medical History:  Diagnosis Date   Chronic kidney disease    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 Vinson note   Pneumonia    Seizure [facility])    Past Surgical History:  Procedure Laterality Date   COLONOSCOPY  05/31/2012   Procedure: COLONOSCOPY;  Surgeon: Oneil DELENA Budge, MD;  Location: [facility];  Service: Gastroenterology;  Laterality: N/A;   PORTACATH PLACEMENT Right 01/27/2023   Procedure: INSERTION PORT-A-CATH, RIJ;  Surgeon: Evonnie Dorothyann DELENA, DO;  Location: [facility] ORS;  Service: General;  Laterality: Right;   ROBOT ASSISTED LAPAROSCOPIC NEPHRECTOMY Left 08/13/2022   Procedure: XI ROBOTIC ASSISTED LAPAROSCOPIC NEPHRECTOMY;  Surgeon: Sherrilee Belvie CROME, MD;  Location: [facility] ORS;  Service: Urology;  Laterality: Left;   TONSILLECTOMY     Patient Active Problem List   Diagnosis Date Noted   Clear cell renal cell carcinoma, left (HCC) 01/12/2023   Abdominal adhesions 08/13/2022   AKI (acute kidney injury) (HCC) 07/23/2022   Renal mass 07/23/2022   Lung nodule 07/23/2022   Urinary retention 07/23/2022   BPH (benign  prostatic hyperplasia) 07/23/2022   Hepatitis C virus infection cured after antiviral drug therapy 07/13/2019   Elevated LFTs 07/13/2019   Seizure disorder (HCC) 01/14/2012   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    ONSET DATE: 03/30/23  REFERRING DIAG: Wound care for gludial fold  THERAPY DIAG:  Gluteal cleft wound, left, sequela  Gluteal pain  Pain in left hip  Complicated open wound of left hip, sequela  Rationale for Evaluation and Treatment: Rehabilitation     Wound Therapy - 09/23/23 1136     Subjective pt reports no issues or new falls since last visit.  Has been sitting on his donut cushion    Patient and Family Stated Goals wound to heal, pain to be gone.    Date of Onset 03/30/23    Prior Treatments self care, hh no longer getting HH    Pain Scale 0-10    Pain Score 0-No pain    Wound Properties Date First Assessed: 07/15/23 Location: Buttocks Wound Description (Comments): Most inferior wound on Lt buttock Present on Admission: Yes   Wound Image Images linked: 1    Dressing Type Gauze (Comment)   medihoney alginate, 2x2, medipore tape   Dressing Changed Changed    Dressing Status Old drainage;Dry    Dressing Change Frequency PRN    Site / Wound Assessment Friable    %  Wound base Red or Granulating 90%    % Wound base Yellow/Fibrinous Exudate 10%    Wound Length (cm) 1.7 cm    Wound Width (cm) 0.5 cm    Wound Depth (cm) 0.1 cm    Wound Volume (cm^3) 0.09 cm^3    Wound Surface Area (cm^2) 0.85 cm^2    Margins Attached edges (approximated)    Drainage Amount Scant    Drainage Description Serosanguineous    Wound Properties Date First Assessed: 07/15/23 Time First Assessed: 0955 Wound Type: Other (Comment) Location: Buttocks Location Orientation: Left Wound Description (Comments): Most superior of the two Lt buttock wounds Present on Admission: Yes   Wound Image Images linked: 1    Dressing Type Gauze (Comment)   medihoney alginate, 2x2, medipore tape   Dressing  Changed Changed    Dressing Status Old drainage    Dressing Change Frequency PRN    Site / Wound Assessment Friable    % Wound base Red or Granulating 80%    % Wound base Yellow/Fibrinous Exudate 20%    Wound Length (cm) 2.2 cm    Wound Width (cm) 0.8 cm    Wound Depth (cm) 0.3 cm    Wound Volume (cm^3) 0.53 cm^3    Wound Surface Area (cm^2) 1.76 cm^2    Margins Epibole (rolled edges)    Drainage Amount Scant    Drainage Description Serosanguineous    Treatment Cleansed;Debridement (Selective)    Wound Properties Date First Assessed: 07/15/23 Time First Assessed: 0920 Wound Type: Other (Comment) , pressure  Location: Hip Location Orientation: Left Present on Admission: Yes   Wound Image Images linked: 1    Dressing Type Gauze (Comment)   vaseline perimeter, medihoney, 2x2, tegaderm   Dressing Status Old drainage    Dressing Change Frequency PRN    Site / Wound Assessment Dry;Pink;Yellow    % Wound base Red or Granulating 75%    % Wound base Yellow/Fibrinous Exudate 25%    Wound Length (cm) 5 cm    Wound Width (cm) 1.9 cm    Wound Depth (cm) 0.2 cm    Wound Volume (cm^3) 1.9 cm^3    Wound Surface Area (cm^2) 9.5 cm^2    Margins Unattached edges (unapproximated)    Drainage Amount Minimal    Drainage Description Serosanguineous    Treatment Cleansed;Debridement (Selective)    Selective Debridement (non-excisional) - Location wound bed; epiboled edges    Selective Debridement (non-excisional) - Tools Used Scalpel;Forceps    Selective Debridement (non-excisional) - Tissue Removed eschar, slough    Wound Therapy - Clinical Statement see below    Wound Therapy - Functional Problem List difficult to lie or sit    Factors Delaying/Impairing Wound Healing Altered sensation;Immobility;Multiple medical problems;Polypharmacy;Vascular compromise    Hydrotherapy Plan Debridement;Dressing change;Patient/family education    Wound Therapy - Frequency 2X / week    Wound Therapy - Current  Recommendations PT    Wound Therapy - Follow Up Recommendations Other (comment)    Wound Plan debridement., PT education and dressing    Dressing  Lt hip medihoney ,4x4, ABD, tegaderm    Dressing Lt buttocks: xeroform, 2X2, 4x4, medipore tape                   PATIENT EDUCATION: Education details: Keep dressing clean and dry, if soiled or wet remove,  Keep off buttock try and be Right side lying as much as possible.  Complete 10 glut sets every hour, increase protein intake.  Person educated: Patient Education method: Explanation Education comprehension: verbalized understanding   HOME EXERCISE PROGRAM: Evaluation:  Glut sets   Access Code: [email or web address] URL: [email or web address] Date: 07/26/2023 Prepared by: Greig Fuse  Exercises - Seated Long Arc Quad  - 2 x daily - 7 x weekly - 2 sets - 10 reps - 5 second hold - Seated March  - 2 x daily - 7 x weekly - 2 sets - 10 reps - 5 second hold - Sit to Stand with Hands on Knees  - 2 x daily - 7 x weekly - 1 sets - 10 reps - Seated Heel Toe Raises  - 2 x daily - 7 x weekly - 1 sets - 10 reps   GOALS: Goals reviewed with patient? No  SHORT TERM GOALS: Target date: 08/05/23  Wounds to be 100% granulated to decrease risk of infection.  Baseline: Goal status: IN PROGRESS  2.  PT pain to be no greater than a 5/10 Baseline:  Goal status: MET  3.  PT wounds to be approximating.  Baseline:  Goal status: IN PROGRESS   LONG TERM GOALS: Target date: 08/26/23  Wounds to be healed.  Baseline:  Goal status: IN PROGRESS  2.  Pain in Lt buttock to be no greater than 2/10 Baseline:  Goal status: IN PROGRESS  ASSESSMENT:  CLINICAL IMPRESSION: Pt's underwear band in center of hip wound with noted pressure being caused.  Pointed this out to patient and advised to keep a check on this and make sure it is not across wound.  Pt verbalized understanding.  All wounds measured and photographed with good reduction  noted, especially to both wounds on Lt buttock.  Feel the pressure relief cushion is helping.  Changed the dressings in this area to xeroform due to increased granulation and less drainage.  Continued with honey on Lt hip.  Dressings are remaining in good contact overall.   OBJECTIVE IMPAIRMENTS: decreased mobility, pain, and decreased skin integrity  .   ACTIVITY LIMITATIONS: sitting, sleeping, bathing, toileting, dressing, and hygiene/grooming   PERSONAL FACTORS: Fitness, Time since onset of injury/illness/exacerbation, and 1 comorbidity: cancer  are also affecting patient's functional outcome.   REHAB POTENTIAL: Good  CLINICAL DECISION MAKING: Evolving/moderate complexity  EVALUATION COMPLEXITY: Moderate  PLAN: PT FREQUENCY: 2x/week  PT DURATION: 6 weeks; continue for 6 additional weeks    PLANNED INTERVENTIONS: 97110-Therapeutic exercises, 97535- Self Care, 02402- Wound care (first 20 sq cm), and debridement and dressing change   PLAN FOR NEXT SESSION: Cleanse, debride and continue to work with dressing to find a dressing that will stay intact to promote healing environment.    Fuse, Merced Hanners B, PTA 09/23/2023, 1:43 PM  09/23/2023, 1:43 PM

## 2023-09-27 ENCOUNTER — Ambulatory Visit (HOSPITAL_COMMUNITY): Payer: Medicare PPO | Admitting: Physical Therapy

## 2023-09-28 ENCOUNTER — Other Ambulatory Visit: Payer: Self-pay

## 2023-09-30 ENCOUNTER — Encounter (HOSPITAL_COMMUNITY): Payer: Self-pay

## 2023-09-30 ENCOUNTER — Other Ambulatory Visit: Payer: Self-pay

## 2023-09-30 ENCOUNTER — Ambulatory Visit (HOSPITAL_COMMUNITY): Payer: Medicare PPO | Admitting: Physical Therapy

## 2023-09-30 ENCOUNTER — Emergency Department (HOSPITAL_COMMUNITY)
Admission: EM | Admit: 2023-09-30 | Discharge: 2023-09-30 | Disposition: A | Discharge status: Home / Self Care | Payer: Medicare PPO | Attending: Emergency Medicine | Admitting: Emergency Medicine

## 2023-09-30 ENCOUNTER — Emergency Department (HOSPITAL_COMMUNITY): Payer: Medicare PPO

## 2023-09-30 ENCOUNTER — Telehealth: Payer: Self-pay | Admitting: *Deleted

## 2023-09-30 DIAGNOSIS — F17210 Nicotine dependence, cigarettes, uncomplicated: Secondary | ICD-10-CM | POA: Insufficient documentation

## 2023-09-30 DIAGNOSIS — X58XXXA Exposure to other specified factors, initial encounter: Secondary | ICD-10-CM | POA: Insufficient documentation

## 2023-09-30 DIAGNOSIS — S31829S Unspecified open wound of left buttock, sequela: Secondary | ICD-10-CM

## 2023-09-30 DIAGNOSIS — R069 Unspecified abnormalities of breathing: Secondary | ICD-10-CM | POA: Diagnosis not present

## 2023-09-30 DIAGNOSIS — S31000A Unspecified open wound of lower back and pelvis without penetration into retroperitoneum, initial encounter: Secondary | ICD-10-CM | POA: Insufficient documentation

## 2023-09-30 DIAGNOSIS — N189 Chronic kidney disease, unspecified: Secondary | ICD-10-CM | POA: Insufficient documentation

## 2023-09-30 DIAGNOSIS — M549 Dorsalgia, unspecified: Secondary | ICD-10-CM | POA: Diagnosis not present

## 2023-09-30 DIAGNOSIS — S71002S Unspecified open wound, left hip, sequela: Secondary | ICD-10-CM

## 2023-09-30 DIAGNOSIS — L89159 Pressure ulcer of sacral region, unspecified stage: Secondary | ICD-10-CM | POA: Diagnosis not present

## 2023-09-30 DIAGNOSIS — M545 Low back pain, unspecified: Secondary | ICD-10-CM | POA: Diagnosis not present

## 2023-09-30 DIAGNOSIS — Z85528 Personal history of other malignant neoplasm of kidney: Secondary | ICD-10-CM | POA: Insufficient documentation

## 2023-09-30 DIAGNOSIS — S3992XA Unspecified injury of lower back, initial encounter: Secondary | ICD-10-CM | POA: Diagnosis present

## 2023-09-30 DIAGNOSIS — M7918 Myalgia, other site: Secondary | ICD-10-CM

## 2023-09-30 DIAGNOSIS — M25552 Pain in left hip: Secondary | ICD-10-CM

## 2023-09-30 DIAGNOSIS — R0602 Shortness of breath: Secondary | ICD-10-CM | POA: Diagnosis not present

## 2023-09-30 LAB — CBC WITH DIFFERENTIAL/PLATELET
Abs Immature Granulocytes: 0.12 10*3/uL — ABNORMAL HIGH (ref 0.00–0.07)
Basophils Absolute: 0 10*3/uL (ref 0.0–0.1)
Basophils Relative: 1 %
Eosinophils Absolute: 0 10*3/uL (ref 0.0–0.5)
Eosinophils Relative: 0 %
HCT: 36.7 % — ABNORMAL LOW (ref 39.0–52.0)
Hemoglobin: 11.6 g/dL — ABNORMAL LOW (ref 13.0–17.0)
Immature Granulocytes: 2 %
Lymphocytes Relative: 41 %
Lymphs Abs: 2.2 10*3/uL (ref 0.7–4.0)
MCH: 32.3 pg (ref 26.0–34.0)
MCHC: 31.6 g/dL (ref 30.0–36.0)
MCV: 102.2 fL — ABNORMAL HIGH (ref 80.0–100.0)
Monocytes Absolute: 0.6 10*3/uL (ref 0.1–1.0)
Monocytes Relative: 11 %
Neutro Abs: 2.4 10*3/uL (ref 1.7–7.7)
Neutrophils Relative %: 45 %
Platelets: 175 10*3/uL (ref 150–400)
RBC: 3.59 MIL/uL — ABNORMAL LOW (ref 4.22–5.81)
RDW: 28.1 % — ABNORMAL HIGH (ref 11.5–15.5)
WBC: 5.3 10*3/uL (ref 4.0–10.5)
nRBC: 0.4 % — ABNORMAL HIGH (ref 0.0–0.2)

## 2023-09-30 LAB — BASIC METABOLIC PANEL
Anion gap: 14 (ref 5–15)
BUN: 25 mg/dL — ABNORMAL HIGH (ref 8–23)
CO2: 17 mmol/L — ABNORMAL LOW (ref 22–32)
Calcium: 9 mg/dL (ref 8.9–10.3)
Chloride: 107 mmol/L (ref 98–111)
Creatinine, Ser: 1.32 mg/dL — ABNORMAL HIGH (ref 0.61–1.24)
GFR, Estimated: 58 mL/min — ABNORMAL LOW (ref >60)
Glucose, Bld: 99 mg/dL (ref 70–99)
Potassium: 4 mmol/L (ref 3.5–5.1)
Sodium: 138 mmol/L (ref 135–145)

## 2023-09-30 MED ORDER — OXYCODONE-ACETAMINOPHEN 5-325 MG PO TABS
1.0000 | ORAL_TABLET | Freq: Once | ORAL | Status: AC
Start: 2023-09-30 — End: 2023-09-30
  Administered 2023-09-30: 1 via ORAL
  Filled 2023-09-30: qty 1

## 2023-09-30 NOTE — Therapy (Signed)
OUTPATIENT PHYSICAL THERAPY Wound TREATMENT   Patient Name: David [name] MRN: [id number] DOB:[birth year], 73 y.o., male Today's Date: 09/30/2023   PCP: Dr. Magda Paganini PROVIDER: Doreatha Massed,  END OF SESSION:  PT End of Session - 09/30/23 1038     Visit Number 18    Number of Visits 24    Date for PT Re-Evaluation 10/07/23    Authorization Type [employer]/Medicaid    Authorization Time Period cohere approved 12 more visits from 12/16 to 1/25    Authorization - Visit Number 6    Authorization - Number of Visits 12    Progress Note Due on Visit 20    PT Start Time 0853    PT Stop Time 0943    PT Time Calculation (min) 50 min    Activity Tolerance Patient tolerated treatment well    Behavior During Therapy [facility] for tasks assessed/performed                Past Medical History:  Diagnosis Date   Chronic kidney disease    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 Ronal Fear note   Pneumonia    Seizure [facility])    Past Surgical History:  Procedure Laterality Date   COLONOSCOPY  05/31/2012   Procedure: COLONOSCOPY;  Surgeon: Dalia Heading, MD;  Location[facility];  Service: Gastroenterology;  Laterality: N/A;   PORTACATH PLACEMENT Right 01/27/2023   Procedure: INSERTION PORT-A-CATH, RIJ;  Surgeon: Lewie Chamber, DO;  Location: [facility] ORS;  Service: General;  Laterality: Right;   ROBOT ASSISTED LAPAROSCOPIC NEPHRECTOMY Left 08/13/2022   Procedure: XI ROBOTIC ASSISTED LAPAROSCOPIC NEPHRECTOMY;  Surgeon: Malen Gauze, MD;  Location: [facility] ORS;  Service: Urology;  Laterality: Left;   TONSILLECTOMY     Patient Active Problem List   Diagnosis Date Noted   Clear cell renal cell carcinoma, left (HCC) 01/12/2023   Abdominal adhesions 08/13/2022   AKI (acute kidney injury) (HCC) 07/23/2022   Renal mass 07/23/2022   Lung nodule 07/23/2022   Urinary retention 07/23/2022   BPH  (benign prostatic hyperplasia) 07/23/2022   Hepatitis C virus infection cured after antiviral drug therapy 07/13/2019   Elevated LFTs 07/13/2019   Seizure disorder (HCC) 01/14/2012   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    ONSET DATE: 03/30/23  REFERRING DIAG: Wound care for gludial fold  THERAPY DIAG:  Gluteal cleft wound, left, sequela  Pain in left hip  Complicated open wound of left hip, sequela  Gluteal pain  Rationale for Evaluation and Treatment: Rehabilitation     Wound Therapy - 09/30/23 1039     Subjective Pt states his back and buttock are painful    Patient and Family Stated Goals wound to heal, pain to be gone.    Date of Onset 03/30/23    Prior Treatments self care, hh no longer getting HH    Pain Score 5     Pain Location Buttocks    Wound Properties Date First Assessed: 07/15/23 Time First Assessed: 0920 Wound Type: Other (Comment) , pressure  Location: Hip Location Orientation: Left Present on Admission: Yes   Dressing Type Gauze (Comment)   vaseline perimeter, medihoney, 2x2, tegaderm   Dressing Status Old drainage    Dressing Change Frequency PRN    Site / Wound Assessment Dry;Pink;Yellow    % Wound base Red or Granulating 70%    % Wound base Yellow/Fibrinous Exudate 30%  Margins Unattached edges (unapproximated)    Drainage Amount Minimal   to moderate   Drainage Description Odor - foul    Treatment Cleansed    Wound Properties Date First Assessed: 07/15/23 Location: Buttocks Wound Description (Comments): Most inferior wound on Lt buttock Present on Admission: Yes   Dressing Type Gauze (Comment)   medihoney alginate, 2x2, medipore tape   Dressing Status Old drainage;Dry    Dressing Change Frequency PRN    Site / Wound Assessment Friable    % Wound base Red or Granulating 65%    % Wound base Yellow/Fibrinous Exudate 35%    Margins Attached edges (approximated)    Drainage Amount Minimal    Drainage Description Odor - foul    Treatment  Cleansed;Debridement (Selective)    Wound Properties Date First Assessed: 07/15/23 Time First Assessed: 0955 Wound Type: Other (Comment) Location: Buttocks Location Orientation: Left Wound Description (Comments): Most superior of the two Lt buttock wounds Present on Admission: Yes   Dressing Type Gauze (Comment)   medihoney alginate, 2x2, medipore tape   Dressing Status Old drainage    Dressing Change Frequency PRN    Site / Wound Assessment Friable    % Wound base Red or Granulating 80%    % Wound base Yellow/Fibrinous Exudate 20%    Margins Epibole (rolled edges)    Drainage Amount Minimal    Drainage Description Odor - foul    Treatment Debridement (Selective);Cleansed    Selective Debridement (non-excisional) - Location wound bed; epiboled edges    Selective Debridement (non-excisional) - Tools Used Scalpel;Forceps    Selective Debridement (non-excisional) - Tissue Removed eschar, slough    Wound Therapy - Clinical Statement see below    Wound Therapy - Functional Problem List difficult to lie or sit    Factors Delaying/Impairing Wound Healing Altered sensation;Immobility;Multiple medical problems;Polypharmacy;Vascular compromise    Hydrotherapy Plan Debridement;Dressing change;Patient/family education    Wound Therapy - Frequency 2X / week    Wound Therapy - Current Recommendations PT    Wound Therapy - Follow Up Recommendations Other (comment)    Wound Plan debridement., PT education and dressing    Dressing  changed to silver alginate 4x4 and tegaderm    Dressing buttock changed to silver alginate 4x4 and medipore tape                   PATIENT EDUCATION: Education details: Keep dressing clean and dry, if soiled or wet remove,  Keep off buttock try and be Right side lying as much as possible.  Complete 10 glut sets every hour, increase protein intake.   Person educated: Patient Education method: Explanation Education comprehension: verbalized understanding   HOME  EXERCISE PROGRAM: Evaluation:  Glut sets   Access Code: [email or web address] URL: [email or web address] Date: 07/26/2023 Prepared by: Emeline Gins  Exercises - Seated Long Arc Quad  - 2 x daily - 7 x weekly - 2 sets - 10 reps - 5 second hold - Seated March  - 2 x daily - 7 x weekly - 2 sets - 10 reps - 5 second hold - Sit to Stand with Hands on Knees  - 2 x daily - 7 x weekly - 1 sets - 10 reps - Seated Heel Toe Raises  - 2 x daily - 7 x weekly - 1 sets - 10 reps   GOALS: Goals reviewed with patient? No  SHORT TERM GOALS: Target date: 08/05/23  Wounds to be 100% granulated to decrease risk of infection.  Baseline:  Goal status: IN PROGRESS  2.  PT pain to be no greater than a 5/10 Baseline:  Goal status: MET  3.  PT wounds to be approximating.  Baseline:  Goal status: IN PROGRESS   LONG TERM GOALS: Target date: 08/26/23  Wounds to be healed.  Baseline:  Goal status: IN PROGRESS  2.  Pain in Lt buttock to be no greater than 2/10 Baseline:  Goal status: IN PROGRESS  ASSESSMENT:  CLINICAL IMPRESSION:  Pt comes into department requesting a wheelchair, pt normally ambulatory.  Therapist concerned about pt appearance checked O2 which was 70, HR read 38.  Therapist unable to obtain HR at wrist, carotid is weak and thready.  Therapist urged pt to go to the ER pt refused.  After discussion therapist decided to treat wounds and recheck O2.  O2 was at 60, HR 63.  Pt unable to transfer from treatment table to wheelchair.  Pt requested to lie back down at which point Therapist called 911.  PT refused for paramedics to treat or transfer pt.  PT was lying down.  Therapist explained that once patient is sitting he is much worse.  Paramedics agreed to stay until transfer was complete.  Pt once again could not transfer.  Paramedics checked PT and told pt that he was going to the hospital.  Pt finally agreed.   OBJECTIVE IMPAIRMENTS: decreased mobility, pain, and decreased skin integrity   .   ACTIVITY LIMITATIONS: sitting, sleeping, bathing, toileting, dressing, and hygiene/grooming   PERSONAL FACTORS: Fitness, Time since onset of injury/illness/exacerbation, and 1 comorbidity: cancer  are also affecting patient's functional outcome.   REHAB POTENTIAL: Good  CLINICAL DECISION MAKING: Evolving/moderate complexity  EVALUATION COMPLEXITY: Moderate  PLAN: PT FREQUENCY: 2x/week  PT DURATION: 6 weeks; continue for 6 additional weeks    PLANNED INTERVENTIONS: 97110-Therapeutic exercises, 97535- Self Care, 60454- Wound care (first 20 sq cm), and debridement and dressing change   PLAN FOR NEXT SESSION: Cleanse, debride and continue to work with dressing to find a dressing that will stay intact to promote healing environment.   Virgina Organ, PT CLT [phone]  09/30/2023, 10:43 AM

## 2023-09-30 NOTE — ED Triage Notes (Signed)
Pt was getting treatment for wound on his buttocks today and outpatient called EMS stating pt was in respiratory distress. EMS arrived and stated pt had no symptoms of distress. Pt is 99% on room air. Pt states his primary concern is the wound that is being treated on his bottom is causing him pain. Pt states wound was treated today.

## 2023-09-30 NOTE — ED Notes (Signed)
Pt demands to go home and wants a prescription for pain medication. EDP notified. Pt wants leads, bp cuff, pulse ox removed. All lines removed from pt. IV will be removed when wheelchair taken to room.

## 2023-09-30 NOTE — Telephone Encounter (Signed)
Received call from sister,Fredricka  stating that patient was seen in the ER and discharged today.  Verbalized that after arriving home he began exhibiting respiratory distress and wanted to know what to do.  Advised that he return to the ER for evaluation if they felt like he was in distress and needed medical attention.  Verbalized understanding, however hesitant to do so, as she "feared he would just be discharged again".

## 2023-09-30 NOTE — ED Provider Notes (Signed)
[place] EMERGENCY DEPARTMENT AT [facility] Provider Note  CSN: [id number] Arrival date & time: 09/30/23 7829  Chief Complaint(s) back pain, sacral wound  HPI David [name] is a 73 y.o. male with history of metastatic renal cell carcinoma presenting to the emergency department with bottom pain.  He reports pain to his bottom.  He has 2 areas of ulceration to his left hip and sacral area from a prior rash and has been following with wound care.  He reports some frustration with how long it is taking to heal.  He reports that he would not of come to the emergency department for this and he saw wound care today.  At wound care, there was some report that he was having respiratory distress and they called the paramedics.  Paramedics reported no evidence of respiratory distress, patient satting 100%.  The patient denies any symptoms of respiratory distress or shortness of breath.  He reports that he has some chronic mild shortness of breath which he has had for years as he has been smoking since he was 73 years old.  He denies any change in his symptom.  No cough.  No fevers or chills.  No chest pain.  No lightheadedness or dizziness.  No leg swelling.   Past Medical History Past Medical History:  Diagnosis Date   Chronic kidney disease    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 Authorizing Provider  amLODipine (NORVASC) 10 MG tablet Take 1 tablet (10 mg total) by mouth daily. 07/12/23   Doreatha Massed, MD  cabozantinib (CABOMETYX) 40 MG tablet Take 1 tablet (40 mg total) by mouth daily. Take on an empty stomach, 1 hour before or 2 hours after meals. 08/11/23   Doreatha Massed, MD  Clobetasol Prop Emollient Base (CLOBETASOL PROPIONATE E) 0.05 % emollient cream Apply 1 Application topically 2 (two) times daily. 05/04/23   Doreatha Massed, MD  levETIRAcetam (KEPPRA) 500 MG tablet Take by mouth. 03/12/23   [provider]  lidocaine (XYLOCAINE) 5 % ointment Apply 1 Application topically 2 (two) times daily as needed for moderate pain. 05/04/23   Doreatha Massed, MD  lidocaine-prilocaine (EMLA) cream Apply to affected area once 01/22/23   Doreatha Massed, MD  lisinopril (ZESTRIL) 10 MG tablet Take by mouth. 03/13/23 09/06/23  [provider]  megestrol (MEGACE) 400 MG/10ML suspension Take 10 mLs (400 mg total) by mouth 2 (two) times daily. 09/06/23   Doreatha Massed, MD  prochlorperazine (COMPAZINE) 10 MG tablet Take 1 tablet (10 mg total) by mouth every 6 (six) hours as needed for nausea or vomiting. 01/22/23   Doreatha Massed, MD  tamsulosin (FLOMAX) 0.4 MG CAPS capsule Take 0.4 mg by mouth daily.     [provider]  triamcinolone cream (KENALOG) 0.1 % SMARTSIG:1 Application Topical 2-3 Times Daily 05/11/23   [provider]                                                                                                                                    Past Surgical History Past Surgical History:  Procedure Laterality Date   COLONOSCOPY  05/31/2012   Procedure: COLONOSCOPY;  Surgeon: Dalia Heading, MD;  Location: [facility] ENDO SUITE;  Service: Gastroenterology;  Laterality: N/A;   PORTACATH PLACEMENT  Right 01/27/2023   Procedure: INSERTION PORT-A-CATH, RIJ;  Surgeon: Lewie Chamber, DO;  Location: [facility] ORS;  Service: General;  Laterality: Right;   ROBOT ASSISTED LAPAROSCOPIC NEPHRECTOMY Left 08/13/2022   Procedure: XI ROBOTIC ASSISTED LAPAROSCOPIC NEPHRECTOMY;  Surgeon: Malen Gauze, MD;  Location: [facility] ORS;  Service: Urology;  Laterality: Left;   TONSILLECTOMY     Family History History reviewed. No pertinent family history.  Social History Social History   Tobacco Use   Smoking status: Every Day    Current packs/day: 0.50    Average packs/day: 0.5 packs/day for 65.0 years (32.5 ttl pk-yrs)    Types: Cigarettes   Smokeless tobacco: Never  Vaping Use   Vaping status: Never Used  Substance Use Topics   Alcohol use: No   Drug use: No   Allergies Patient has no known allergies.  Review of Systems Review of Systems  All other systems reviewed and are negative.   Physical Exam Vital Signs  I have reviewed the triage vital signs BP 124/83   Pulse 93   Temp 98.4 F (36.9 C) (Oral)   Resp 12   Ht 5\' 11"  (1.803 m)   Wt 89.8 kg   SpO2 97%   BMI 27.61 kg/m  Physical Exam Vitals and nursing note reviewed.  Constitutional:      General: He is not in acute distress.    Appearance: Normal appearance.  HENT:     Mouth/Throat:     Mouth: Mucous membranes are moist.  Eyes:     Conjunctiva/sclera: Conjunctivae normal.  Cardiovascular:     Rate and Rhythm: Normal rate and regular rhythm.  Pulmonary:     Effort: Pulmonary effort is normal. No respiratory distress.     Breath sounds: Normal breath sounds.  Abdominal:     General: Abdomen is flat.     Palpations: Abdomen is soft.     Tenderness: There is no abdominal tenderness.  Musculoskeletal:     Right lower leg: No edema.     Left lower leg: No edema.  Skin:    General: Skin is warm and dry.     Capillary Refill: Capillary refill takes less than 2 seconds.     Comments: Superficial ulceration to the  left hip and the presacral area, each about 1 to 2 cm.  Neurological:     Mental Status: He is alert and oriented  to person, place, and time. Mental status is at baseline.  Psychiatric:        Mood and Affect: Mood normal.        Behavior: Behavior normal.     ED Results and Treatments Labs (all labs ordered are listed, but only abnormal results are displayed) Labs Reviewed  BASIC METABOLIC PANEL - Abnormal; Notable for the following components:      Result Value   CO2 17 (*)    BUN 25 (*)    Creatinine, Ser 1.32 (*)    GFR, Estimated 58 (*)    All other components within normal limits  CBC WITH DIFFERENTIAL/PLATELET - Abnormal; Notable for the following components:   RBC 3.59 (*)    Hemoglobin 11.6 (*)    HCT 36.7 (*)    MCV 102.2 (*)    RDW 28.1 (*)    nRBC 0.4 (*)    Abs Immature Granulocytes 0.12 (*)    All other components within normal limits                                                                                                                          Radiology No results found.  Pertinent labs & imaging results that were available during my care of the patient were reviewed by me and considered in my medical decision making (see MDM for details).  Medications Ordered in ED Medications  oxyCODONE-acetaminophen (PERCOCET/ROXICET) 5-325 MG per tablet 1 tablet (1 tablet Oral Given 09/30/23 1042)                                                                                                                                     Procedures Procedures  (including critical care time)  Medical Decision Making / ED Course   MDM:  73 year old male presenting to the emergency department with sacral pain.  On exam he has 2 superficial ulcers which appear well-healing, no signs of active infection.  He does not appear short of breath.  He denies any symptoms of shortness of breath.  Will treat his pain.  Given that he was sent here for some reported respiratory  distress even though the patient denies any symptoms, we will check some basic laboratory testing and chest x-ray, EKG.  Anticipate likely discharge if workup is reassuring.  Clinical Course as of 10/01/23 1520  Thu Sep 30, 2023  1431 Patient continues  to feel well without shortness of breath. Has mild acidosis on BMP which appears chronic. CKD at baseline. Patient requests discharge. Will discharge patient to home. All questions answered. Patient comfortable with plan of discharge. Return precautions discussed with patient and specified on the after visit summary.  [WS]    Clinical Course User Index [WS] Lonell Grandchild, MD     Additional history obtained: -Additional history obtained from friend -External records from outside source obtained and reviewed including: Chart review including previous notes, labs, imaging, consultation notes including prior notes    Lab Tests: -I ordered, reviewed, and interpreted labs.   The pertinent results include:   Labs Reviewed  BASIC METABOLIC PANEL - Abnormal; Notable for the following components:      Result Value   CO2 17 (*)    BUN 25 (*)    Creatinine, Ser 1.32 (*)    GFR, Estimated 58 (*)    All other components within normal limits  CBC WITH DIFFERENTIAL/PLATELET - Abnormal; Notable for the following components:   RBC 3.59 (*)    Hemoglobin 11.6 (*)    HCT 36.7 (*)    MCV 102.2 (*)    RDW 28.1 (*)    nRBC 0.4 (*)    Abs Immature Granulocytes 0.12 (*)    All other components within normal limits    Notable for chronic mild low CO2, CKD, mild anemia   EKG   EKG Interpretation Date/Time:  Thursday September 30 2023 10:04:14 EST Ventricular Rate:  105 PR Interval:    QRS Duration:  78 QT Interval:  311 QTC Calculation: 411 R Axis:   97  Text Interpretation: Atrial fibrillation Right axis deviation Probable anteroseptal infarct, old Confirmed by Alvino Blood ([id number]) on 09/30/2023 1:47:34 PM         Imaging Studies  ordered: I ordered imaging studies including CXR On my interpretation imaging demonstrates no acute process I independently visualized and interpreted imaging. I agree with the radiologist interpretation   Medicines ordered and prescription drug management: Meds ordered this encounter  Medications   oxyCODONE-acetaminophen (PERCOCET/ROXICET) 5-325 MG per tablet 1 tablet    Refill:  0    -I have reviewed the patients home medicines and have made adjustments as needed   Cardiac Monitoring: The patient was maintained on a cardiac monitor.  I personally viewed and interpreted the cardiac monitored which showed an underlying rhythm of: afib  Social Determinants of Health:  Diagnosis or treatment significantly limited by social determinants of health: current smoker   Reevaluation: After the interventions noted above, I reevaluated the patient and found that their symptoms have improved  Co morbidities that complicate the patient evaluation  Past Medical History:  Diagnosis Date   Chronic kidney disease    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 Ronal Fear note   Pneumonia    Seizure [facility])       Dispostion: Disposition decision including need for hospitalization was considered, and patient discharged from emergency department.    Final Clinical Impression(s) / ED Diagnoses Final diagnoses:  Wound of sacral region, initial encounter     This chart was dictated using voice recognition software.  Despite best efforts to proofread,  errors can occur which can change the documentation meaning.    Lonell Grandchild, MD 10/01/23 1520

## 2023-09-30 NOTE — ED Notes (Signed)
Pt refused to let this nurse get another set of vitals prior to discharge stating "I want to go the hell home". IV removed. Discharge instructions reviewed.

## 2023-10-04 ENCOUNTER — Encounter: Payer: Self-pay | Admitting: Hematology

## 2023-10-04 ENCOUNTER — Other Ambulatory Visit: Payer: Self-pay

## 2023-10-04 ENCOUNTER — Other Ambulatory Visit (HOSPITAL_COMMUNITY): Payer: Self-pay

## 2023-10-04 ENCOUNTER — Telehealth: Payer: Self-pay

## 2023-10-04 NOTE — Telephone Encounter (Signed)
Oral Oncology Patient Advocate Encounter  Was successful in securing patient a $10,000.00 grant from [facility] to provide copayment coverage for Cabometyx.  This will keep the out of pocket expense at $0.     [employer] ID: [id number].   The billing information is as follows and has been shared with [facility].    RxBin: [id number] PCN: [id number] Member ID: [id number] Group ID: [id number] Dates of Eligibility: 09/04/23 through 09/02/24  Fund:  Renal Cell Carcinoma   Ardeen Fillers, CPhT Oncology Pharmacy Patient Advocate  [facility]  [phone] (phone) [phone] (fax) 10/04/2023 10:02 AM

## 2023-10-04 NOTE — Progress Notes (Signed)
Specialty Pharmacy Ongoing Clinical Assessment Note  David [name] is a 73 y.o. male who is being followed by the specialty pharmacy service for RxSp Oncology   Patient's specialty medication(s) reviewed today: Cabozantinib S-Malate (Cabometyx)   Missed doses in the last 4 weeks: 0   Patient/Caregiver did not have any additional questions or concerns.   Therapeutic benefit summary: Unable to assess (Last OV 12/243/24 naotes PET scan on 06/06/23: Mixed response with improvements in prior metastasis but new nodal/bone lesions. Will repeat PET scan prior to next visit.)   Adverse events/side effects summary: No adverse events/side effects   Patient's therapy is appropriate to: Continue    Goals Addressed             This Visit's Progress    Stabilization of disease       Patient is on track. Patient will be evaluated at upcoming provider appointment to assess progress         Follow up:  6 months  [name] Specialty Pharmacist

## 2023-10-04 NOTE — Progress Notes (Signed)
Specialty Pharmacy Refill Coordination Note  David [name] is a 73 y.o. male contacted today regarding refills of specialty medication(s) Cabozantinib S-Malate (Cabometyx)   Patient requested Delivery   Delivery date: 10/08/23   Verified address: [address]   Medication will be filled on 10/07/23.

## 2023-10-05 ENCOUNTER — Telehealth (HOSPITAL_COMMUNITY): Payer: Self-pay | Admitting: Physical Therapy

## 2023-10-05 ENCOUNTER — Ambulatory Visit (HOSPITAL_COMMUNITY): Payer: Medicare PPO | Admitting: Physical Therapy

## 2023-10-05 NOTE — Telephone Encounter (Signed)
Pt did not show for appt. Called and spoke to pt who answered "No" to coming to therapy and then hung up on therapist.   Lurena Nida, PTA/CLT [facility] Ph: [phone]

## 2023-10-07 ENCOUNTER — Emergency Department (HOSPITAL_COMMUNITY): Payer: Medicare PPO

## 2023-10-07 ENCOUNTER — Inpatient Hospital Stay: Payer: Medicare PPO | Attending: Hematology

## 2023-10-07 ENCOUNTER — Inpatient Hospital Stay (HOSPITAL_COMMUNITY)
Admission: EM | Admit: 2023-10-07 | Discharge: 2023-10-25 | DRG: 542 | Disposition: A | Discharge status: Hospice | Payer: Medicare PPO | Source: Ambulatory Visit | Attending: Internal Medicine | Admitting: Internal Medicine

## 2023-10-07 ENCOUNTER — Encounter: Payer: Self-pay | Admitting: Hematology

## 2023-10-07 ENCOUNTER — Inpatient Hospital Stay (HOSPITAL_COMMUNITY): Admission: RE | Admit: 2023-10-07 | Payer: Medicare PPO | Source: Ambulatory Visit

## 2023-10-07 ENCOUNTER — Ambulatory Visit
Admit: 2023-10-07 | Discharge: 2023-10-07 | Disposition: A | Discharge status: Home / Self Care | Payer: Medicare PPO | Attending: Radiation Oncology | Admitting: Radiation Oncology

## 2023-10-07 ENCOUNTER — Other Ambulatory Visit: Payer: Self-pay

## 2023-10-07 ENCOUNTER — Encounter (HOSPITAL_COMMUNITY): Payer: Self-pay

## 2023-10-07 DIAGNOSIS — C771 Secondary and unspecified malignant neoplasm of intrathoracic lymph nodes: Secondary | ICD-10-CM | POA: Diagnosis present

## 2023-10-07 DIAGNOSIS — Z8616 Personal history of COVID-19: Secondary | ICD-10-CM

## 2023-10-07 DIAGNOSIS — Z905 Acquired absence of kidney: Secondary | ICD-10-CM

## 2023-10-07 DIAGNOSIS — C642 Malignant neoplasm of left kidney, except renal pelvis: Secondary | ICD-10-CM

## 2023-10-07 DIAGNOSIS — C7949 Secondary malignant neoplasm of other parts of nervous system: Secondary | ICD-10-CM | POA: Diagnosis not present

## 2023-10-07 DIAGNOSIS — D72819 Decreased white blood cell count, unspecified: Secondary | ICD-10-CM | POA: Diagnosis not present

## 2023-10-07 DIAGNOSIS — C7951 Secondary malignant neoplasm of bone: Principal | ICD-10-CM | POA: Diagnosis present

## 2023-10-07 DIAGNOSIS — E872 Acidosis, unspecified: Secondary | ICD-10-CM | POA: Diagnosis present

## 2023-10-07 DIAGNOSIS — C779 Secondary and unspecified malignant neoplasm of lymph node, unspecified: Secondary | ICD-10-CM | POA: Diagnosis not present

## 2023-10-07 DIAGNOSIS — G893 Neoplasm related pain (acute) (chronic): Secondary | ICD-10-CM | POA: Diagnosis not present

## 2023-10-07 DIAGNOSIS — D509 Iron deficiency anemia, unspecified: Secondary | ICD-10-CM | POA: Diagnosis present

## 2023-10-07 DIAGNOSIS — Z515 Encounter for palliative care: Secondary | ICD-10-CM

## 2023-10-07 DIAGNOSIS — Z602 Problems related to living alone: Secondary | ICD-10-CM | POA: Diagnosis present

## 2023-10-07 DIAGNOSIS — Z5986 Financial insecurity: Secondary | ICD-10-CM

## 2023-10-07 DIAGNOSIS — E860 Dehydration: Secondary | ICD-10-CM | POA: Diagnosis present

## 2023-10-07 DIAGNOSIS — E440 Moderate protein-calorie malnutrition: Secondary | ICD-10-CM | POA: Diagnosis present

## 2023-10-07 DIAGNOSIS — N179 Acute kidney failure, unspecified: Secondary | ICD-10-CM | POA: Diagnosis present

## 2023-10-07 DIAGNOSIS — Z7189 Other specified counseling: Secondary | ICD-10-CM | POA: Diagnosis not present

## 2023-10-07 DIAGNOSIS — F17210 Nicotine dependence, cigarettes, uncomplicated: Secondary | ICD-10-CM | POA: Diagnosis present

## 2023-10-07 DIAGNOSIS — C7972 Secondary malignant neoplasm of left adrenal gland: Secondary | ICD-10-CM | POA: Diagnosis present

## 2023-10-07 DIAGNOSIS — L89323 Pressure ulcer of left buttock, stage 3: Secondary | ICD-10-CM | POA: Diagnosis present

## 2023-10-07 DIAGNOSIS — M8448XA Pathological fracture, other site, initial encounter for fracture: Secondary | ICD-10-CM | POA: Diagnosis present

## 2023-10-07 DIAGNOSIS — J90 Pleural effusion, not elsewhere classified: Secondary | ICD-10-CM | POA: Diagnosis present

## 2023-10-07 DIAGNOSIS — Z8619 Personal history of other infectious and parasitic diseases: Secondary | ICD-10-CM

## 2023-10-07 DIAGNOSIS — G4700 Insomnia, unspecified: Secondary | ICD-10-CM | POA: Diagnosis present

## 2023-10-07 DIAGNOSIS — E88A Wasting disease (syndrome) due to underlying condition: Secondary | ICD-10-CM | POA: Diagnosis present

## 2023-10-07 DIAGNOSIS — Z66 Do not resuscitate: Secondary | ICD-10-CM | POA: Diagnosis not present

## 2023-10-07 DIAGNOSIS — K521 Toxic gastroenteritis and colitis: Secondary | ICD-10-CM | POA: Diagnosis present

## 2023-10-07 DIAGNOSIS — G40909 Epilepsy, unspecified, not intractable, without status epilepticus: Secondary | ICD-10-CM | POA: Diagnosis present

## 2023-10-07 DIAGNOSIS — C7989 Secondary malignant neoplasm of other specified sites: Secondary | ICD-10-CM | POA: Diagnosis present

## 2023-10-07 DIAGNOSIS — N182 Chronic kidney disease, stage 2 (mild): Secondary | ICD-10-CM | POA: Diagnosis present

## 2023-10-07 DIAGNOSIS — J9601 Acute respiratory failure with hypoxia: Secondary | ICD-10-CM | POA: Diagnosis present

## 2023-10-07 DIAGNOSIS — G4733 Obstructive sleep apnea (adult) (pediatric): Secondary | ICD-10-CM | POA: Diagnosis present

## 2023-10-07 DIAGNOSIS — R7989 Other specified abnormal findings of blood chemistry: Secondary | ICD-10-CM

## 2023-10-07 DIAGNOSIS — I129 Hypertensive chronic kidney disease with stage 1 through stage 4 chronic kidney disease, or unspecified chronic kidney disease: Secondary | ICD-10-CM | POA: Diagnosis present

## 2023-10-07 DIAGNOSIS — Z6827 Body mass index (BMI) 27.0-27.9, adult: Secondary | ICD-10-CM

## 2023-10-07 DIAGNOSIS — J9811 Atelectasis: Secondary | ICD-10-CM | POA: Diagnosis not present

## 2023-10-07 DIAGNOSIS — R918 Other nonspecific abnormal finding of lung field: Secondary | ICD-10-CM | POA: Diagnosis not present

## 2023-10-07 DIAGNOSIS — Z8249 Family history of ischemic heart disease and other diseases of the circulatory system: Secondary | ICD-10-CM

## 2023-10-07 DIAGNOSIS — C781 Secondary malignant neoplasm of mediastinum: Secondary | ICD-10-CM | POA: Diagnosis not present

## 2023-10-07 DIAGNOSIS — Z9089 Acquired absence of other organs: Secondary | ICD-10-CM

## 2023-10-07 DIAGNOSIS — T451X5A Adverse effect of antineoplastic and immunosuppressive drugs, initial encounter: Secondary | ICD-10-CM | POA: Diagnosis present

## 2023-10-07 DIAGNOSIS — C786 Secondary malignant neoplasm of retroperitoneum and peritoneum: Secondary | ICD-10-CM | POA: Diagnosis not present

## 2023-10-07 DIAGNOSIS — R197 Diarrhea, unspecified: Principal | ICD-10-CM | POA: Diagnosis present

## 2023-10-07 DIAGNOSIS — R0602 Shortness of breath: Secondary | ICD-10-CM | POA: Diagnosis not present

## 2023-10-07 DIAGNOSIS — Z4682 Encounter for fitting and adjustment of non-vascular catheter: Secondary | ICD-10-CM | POA: Diagnosis not present

## 2023-10-07 DIAGNOSIS — Z7969 Long term (current) use of other immunomodulators and immunosuppressants: Secondary | ICD-10-CM

## 2023-10-07 DIAGNOSIS — Z9889 Other specified postprocedural states: Secondary | ICD-10-CM

## 2023-10-07 DIAGNOSIS — K5900 Constipation, unspecified: Secondary | ICD-10-CM | POA: Diagnosis not present

## 2023-10-07 DIAGNOSIS — N400 Enlarged prostate without lower urinary tract symptoms: Secondary | ICD-10-CM | POA: Diagnosis present

## 2023-10-07 DIAGNOSIS — D638 Anemia in other chronic diseases classified elsewhere: Secondary | ICD-10-CM | POA: Diagnosis not present

## 2023-10-07 DIAGNOSIS — Z8701 Personal history of pneumonia (recurrent): Secondary | ICD-10-CM

## 2023-10-07 DIAGNOSIS — C659 Malignant neoplasm of unspecified renal pelvis: Secondary | ICD-10-CM | POA: Diagnosis not present

## 2023-10-07 DIAGNOSIS — R54 Age-related physical debility: Secondary | ICD-10-CM | POA: Diagnosis present

## 2023-10-07 DIAGNOSIS — C787 Secondary malignant neoplasm of liver and intrahepatic bile duct: Secondary | ICD-10-CM

## 2023-10-07 DIAGNOSIS — Z79899 Other long term (current) drug therapy: Secondary | ICD-10-CM

## 2023-10-07 DIAGNOSIS — D696 Thrombocytopenia, unspecified: Secondary | ICD-10-CM | POA: Diagnosis not present

## 2023-10-07 DIAGNOSIS — C772 Secondary and unspecified malignant neoplasm of intra-abdominal lymph nodes: Secondary | ICD-10-CM | POA: Diagnosis not present

## 2023-10-07 DIAGNOSIS — C799 Secondary malignant neoplasm of unspecified site: Secondary | ICD-10-CM

## 2023-10-07 HISTORY — DX: Malignant (primary) neoplasm, unspecified: C80.1

## 2023-10-07 LAB — LACTIC ACID, PLASMA
Lactic Acid, Venous: >9 mmol/L (ref 0.5–1.9)
Lactic Acid, Venous: 3.6 mmol/L (ref 0.5–1.9)
Lactic Acid, Venous: 2.2 mmol/L (ref 0.5–1.9)
Lactic Acid, Venous: 2.2 mmol/L (ref 0.5–1.9)

## 2023-10-07 LAB — CBC WITH DIFFERENTIAL/PLATELET
Abs Immature Granulocytes: 0.12 10*3/uL — ABNORMAL HIGH (ref 0.00–0.07)
Abs Immature Granulocytes: 0.15 10*3/uL — ABNORMAL HIGH (ref 0.00–0.07)
Basophils Absolute: 0 10*3/uL (ref 0.0–0.1)
Basophils Absolute: 0 10*3/uL (ref 0.0–0.1)
Basophils Relative: 0 %
Basophils Relative: 1 %
Eosinophils Absolute: 0 10*3/uL (ref 0.0–0.5)
Eosinophils Absolute: 0 10*3/uL (ref 0.0–0.5)
Eosinophils Relative: 0 %
Eosinophils Relative: 0 %
HCT: 36.6 % — ABNORMAL LOW (ref 39.0–52.0)
HCT: 39.5 % (ref 39.0–52.0)
Hemoglobin: 11.8 g/dL — ABNORMAL LOW (ref 13.0–17.0)
Hemoglobin: 11.9 g/dL — ABNORMAL LOW (ref 13.0–17.0)
Immature Granulocytes: 2 %
Immature Granulocytes: 2 %
Lymphocytes Relative: 47 %
Lymphocytes Relative: 47 %
Lymphs Abs: 3.4 10*3/uL (ref 0.7–4.0)
Lymphs Abs: 3.5 10*3/uL (ref 0.7–4.0)
MCH: 32.2 pg (ref 26.0–34.0)
MCH: 33.3 pg (ref 26.0–34.0)
MCHC: 30.1 g/dL (ref 30.0–36.0)
MCHC: 32.2 g/dL (ref 30.0–36.0)
MCV: 103.4 fL — ABNORMAL HIGH (ref 80.0–100.0)
MCV: 107 fL — ABNORMAL HIGH (ref 80.0–100.0)
Monocytes Absolute: 0.8 10*3/uL (ref 0.1–1.0)
Monocytes Absolute: 0.8 10*3/uL (ref 0.1–1.0)
Monocytes Relative: 10 %
Monocytes Relative: 11 %
Neutro Abs: 2.9 10*3/uL (ref 1.7–7.7)
Neutro Abs: 3 10*3/uL (ref 1.7–7.7)
Neutrophils Relative %: 40 %
Neutrophils Relative %: 40 %
Platelets: 220 10*3/uL (ref 150–400)
Platelets: 226 10*3/uL (ref 150–400)
RBC: 3.54 MIL/uL — ABNORMAL LOW (ref 4.22–5.81)
RBC: 3.69 MIL/uL — ABNORMAL LOW (ref 4.22–5.81)
RDW: 28.2 % — ABNORMAL HIGH (ref 11.5–15.5)
RDW: 28.5 % — ABNORMAL HIGH (ref 11.5–15.5)
Smear Review: ADEQUATE
Smear Review: ADEQUATE
WBC: 7.3 10*3/uL (ref 4.0–10.5)
WBC: 7.5 10*3/uL (ref 4.0–10.5)
nRBC: 0.8 % — ABNORMAL HIGH (ref 0.0–0.2)
nRBC: 0.8 % — ABNORMAL HIGH (ref 0.0–0.2)

## 2023-10-07 LAB — D-DIMER, QUANTITATIVE: D-Dimer, Quant: 2.17 ug{FEU}/mL — ABNORMAL HIGH (ref 0.00–0.50)

## 2023-10-07 LAB — MAGNESIUM: Magnesium: 2.2 mg/dL (ref 1.7–2.4)

## 2023-10-07 LAB — COMPREHENSIVE METABOLIC PANEL
ALT: 29 U/L (ref 0–44)
ALT: 30 U/L (ref 0–44)
AST: 35 U/L (ref 15–41)
AST: 35 U/L (ref 15–41)
Albumin: 3.9 g/dL (ref 3.5–5.0)
Albumin: 4 g/dL (ref 3.5–5.0)
Alkaline Phosphatase: 119 U/L (ref 38–126)
Alkaline Phosphatase: 121 U/L (ref 38–126)
Anion gap: 19 — ABNORMAL HIGH (ref 5–15)
Anion gap: 20 — ABNORMAL HIGH (ref 5–15)
BUN: 25 mg/dL — ABNORMAL HIGH (ref 8–23)
BUN: 25 mg/dL — ABNORMAL HIGH (ref 8–23)
CO2: 13 mmol/L — ABNORMAL LOW (ref 22–32)
CO2: 15 mmol/L — ABNORMAL LOW (ref 22–32)
Calcium: 9.5 mg/dL (ref 8.9–10.3)
Calcium: 9.5 mg/dL (ref 8.9–10.3)
Chloride: 106 mmol/L (ref 98–111)
Chloride: 107 mmol/L (ref 98–111)
Creatinine, Ser: 1.34 mg/dL — ABNORMAL HIGH (ref 0.61–1.24)
Creatinine, Ser: 1.4 mg/dL — ABNORMAL HIGH (ref 0.61–1.24)
GFR, Estimated: 54 mL/min — ABNORMAL LOW (ref >60)
GFR, Estimated: 57 mL/min — ABNORMAL LOW (ref >60)
Glucose, Bld: 144 mg/dL — ABNORMAL HIGH (ref 70–99)
Glucose, Bld: 144 mg/dL — ABNORMAL HIGH (ref 70–99)
Potassium: 4.3 mmol/L (ref 3.5–5.1)
Potassium: 4.3 mmol/L (ref 3.5–5.1)
Sodium: 140 mmol/L (ref 135–145)
Sodium: 140 mmol/L (ref 135–145)
Total Bilirubin: 1.1 mg/dL (ref 0.0–1.2)
Total Bilirubin: 1.3 mg/dL — ABNORMAL HIGH (ref 0.0–1.2)
Total Protein: 7.8 g/dL (ref 6.5–8.1)
Total Protein: 7.8 g/dL (ref 6.5–8.1)

## 2023-10-07 LAB — RESP PANEL BY RT-PCR (RSV, FLU A&B, COVID)  RVPGX2
Influenza A by PCR: NEGATIVE
Influenza B by PCR: NEGATIVE
Resp Syncytial Virus by PCR: NEGATIVE
SARS Coronavirus 2 by RT PCR: NEGATIVE

## 2023-10-07 LAB — URINALYSIS, W/ REFLEX TO CULTURE (INFECTION SUSPECTED)
Bacteria, UA: NONE SEEN
Bilirubin Urine: NEGATIVE
Glucose, UA: NEGATIVE mg/dL
Hgb urine dipstick: NEGATIVE
Ketones, ur: NEGATIVE mg/dL
Leukocytes,Ua: NEGATIVE
Nitrite: NEGATIVE
Protein, ur: NEGATIVE mg/dL
Specific Gravity, Urine: 1.03 (ref 1.005–1.030)
pH: 5 (ref 5.0–8.0)

## 2023-10-07 LAB — GLUCOSE, CAPILLARY: Glucose-Capillary: 107 mg/dL — ABNORMAL HIGH (ref 70–99)

## 2023-10-07 LAB — PROTIME-INR
INR: 1.1 (ref 0.8–1.2)
Prothrombin Time: 14.6 s (ref 11.4–15.2)

## 2023-10-07 MED ORDER — ACETAMINOPHEN 650 MG RE SUPP: 650.0000 mg | Freq: Four times a day (QID) | RECTAL | Status: DC | PRN

## 2023-10-07 MED ORDER — SODIUM CHLORIDE 0.9% FLUSH
3.0000 mL | Freq: Two times a day (BID) | INTRAVENOUS | Status: DC
  Administered 2023-10-07 – 2023-10-13 (×7): 3 mL via INTRAVENOUS
  Administered 2023-10-14: 10 mL via INTRAVENOUS
  Administered 2023-10-14 – 2023-10-25 (×15): 3 mL via INTRAVENOUS

## 2023-10-07 MED ORDER — LACTATED RINGERS IV BOLUS (SEPSIS)
1000.0000 mL | Freq: Once | INTRAVENOUS | Status: AC
  Administered 2023-10-07: 1000 mL via INTRAVENOUS

## 2023-10-07 MED ORDER — TRAZODONE HCL 50 MG PO TABS: 50.0000 mg | ORAL_TABLET | Freq: Every evening | ORAL | Status: DC | PRN

## 2023-10-07 MED ORDER — SODIUM CHLORIDE 0.9% FLUSH
10.0000 mL | INTRAVENOUS | Status: DC | PRN
  Administered 2023-10-20: 10 mL

## 2023-10-07 MED ORDER — ENSURE ENLIVE PO LIQD
237.0000 mL | Freq: Two times a day (BID) | ORAL | Status: DC
  Administered 2023-10-08: 237 mL via ORAL
  Filled 2023-10-07 (×2): qty 237

## 2023-10-07 MED ORDER — BISACODYL 10 MG RE SUPP: 10.0000 mg | Freq: Every day | RECTAL | Status: DC | PRN

## 2023-10-07 MED ORDER — SODIUM CHLORIDE 0.9 % IV SOLN: INTRAVENOUS | Status: AC | PRN

## 2023-10-07 MED ORDER — SODIUM CHLORIDE 0.9 % IV BOLUS: 1000.0000 mL | Freq: Once | INTRAVENOUS | Status: DC

## 2023-10-07 MED ORDER — HEPARIN SODIUM (PORCINE) 5000 UNIT/ML IJ SOLN
5000.0000 [IU] | Freq: Three times a day (TID) | INTRAMUSCULAR | Status: DC
  Administered 2023-10-07 – 2023-10-14 (×19): 5000 [IU] via SUBCUTANEOUS
  Filled 2023-10-07 (×20): qty 1

## 2023-10-07 MED ORDER — SENNOSIDES-DOCUSATE SODIUM 8.6-50 MG PO TABS
2.0000 | ORAL_TABLET | Freq: Every day | ORAL | Status: DC
  Administered 2023-10-07 – 2023-10-24 (×13): 2 via ORAL
  Filled 2023-10-07 (×16): qty 2

## 2023-10-07 MED ORDER — LEVETIRACETAM 500 MG PO TABS: 500.0000 mg | ORAL_TABLET | Freq: Two times a day (BID) | ORAL | Status: DC

## 2023-10-07 MED ORDER — METHYLPREDNISOLONE SODIUM SUCC 40 MG IJ SOLR
40.0000 mg | Freq: Two times a day (BID) | INTRAMUSCULAR | Status: DC
Start: 2023-10-07 — End: 2023-10-14
  Administered 2023-10-07 – 2023-10-14 (×14): 40 mg via INTRAVENOUS
  Filled 2023-10-07 (×14): qty 1

## 2023-10-07 MED ORDER — ONDANSETRON HCL 4 MG PO TABS: 4.0000 mg | ORAL_TABLET | Freq: Four times a day (QID) | ORAL | Status: DC | PRN

## 2023-10-07 MED ORDER — CABOZANTINIB S-MALATE 40 MG PO TABS: 40.0000 mg | ORAL_TABLET | Freq: Every day | ORAL | Status: DC

## 2023-10-07 MED ORDER — AMLODIPINE BESYLATE 5 MG PO TABS
10.0000 mg | ORAL_TABLET | Freq: Every day | ORAL | Status: DC
  Administered 2023-10-07 – 2023-10-25 (×19): 10 mg via ORAL
  Filled 2023-10-07 (×5): qty 1
  Filled 2023-10-07: qty 2
  Filled 2023-10-07 (×3): qty 1
  Filled 2023-10-07: qty 2
  Filled 2023-10-07: qty 1
  Filled 2023-10-07: qty 2
  Filled 2023-10-07 (×6): qty 1
  Filled 2023-10-07: qty 2

## 2023-10-07 MED ORDER — IOHEXOL 350 MG/ML SOLN
75.0000 mL | Freq: Once | INTRAVENOUS | Status: AC | PRN
  Administered 2023-10-07: 75 mL via INTRAVENOUS

## 2023-10-07 MED ORDER — IPRATROPIUM-ALBUTEROL 0.5-2.5 (3) MG/3ML IN SOLN
3.0000 mL | Freq: Once | RESPIRATORY_TRACT | Status: AC
Start: 2023-10-07 — End: 2023-10-07
  Administered 2023-10-07: 3 mL via RESPIRATORY_TRACT
  Filled 2023-10-07: qty 3

## 2023-10-07 MED ORDER — MIRTAZAPINE 15 MG PO TABS
7.5000 mg | ORAL_TABLET | Freq: Every day | ORAL | Status: DC
  Administered 2023-10-07 – 2023-10-24 (×18): 7.5 mg via ORAL
  Filled 2023-10-07 (×18): qty 1

## 2023-10-07 MED ORDER — SODIUM CHLORIDE 0.9 % IV BOLUS
1000.0000 mL | Freq: Once | INTRAVENOUS | Status: AC
  Administered 2023-10-07: 1000 mL via INTRAVENOUS

## 2023-10-07 MED ORDER — TAMSULOSIN HCL 0.4 MG PO CAPS
0.4000 mg | ORAL_CAPSULE | Freq: Every day | ORAL | Status: DC
  Administered 2023-10-07 – 2023-10-25 (×19): 0.4 mg via ORAL
  Filled 2023-10-07 (×19): qty 1

## 2023-10-07 MED ORDER — SODIUM CHLORIDE 0.9% FLUSH: 10.0000 mL | INTRAVENOUS | Status: DC | PRN

## 2023-10-07 MED ORDER — OXYCODONE HCL 5 MG PO TABS
5.0000 mg | ORAL_TABLET | ORAL | Status: DC | PRN
  Administered 2023-10-07 – 2023-10-24 (×12): 5 mg via ORAL
  Filled 2023-10-07 (×13): qty 1

## 2023-10-07 MED ORDER — SODIUM CHLORIDE 0.9% FLUSH
3.0000 mL | Freq: Two times a day (BID) | INTRAVENOUS | Status: DC
  Administered 2023-10-07 – 2023-10-14 (×6): 3 mL via INTRAVENOUS
  Administered 2023-10-14: 10 mL via INTRAVENOUS
  Administered 2023-10-15 – 2023-10-25 (×12): 3 mL via INTRAVENOUS

## 2023-10-07 MED ORDER — LABETALOL HCL 5 MG/ML IV SOLN: 10.0000 mg | INTRAVENOUS | Status: DC | PRN

## 2023-10-07 MED ORDER — SODIUM CHLORIDE 0.9% FLUSH
3.0000 mL | INTRAVENOUS | Status: DC | PRN
  Administered 2023-10-13: 3 mL via INTRAVENOUS

## 2023-10-07 MED ORDER — POLYETHYLENE GLYCOL 3350 17 G PO PACK: 17.0000 g | PACK | Freq: Every day | ORAL | Status: DC | PRN

## 2023-10-07 MED ORDER — METHYLPREDNISOLONE SODIUM SUCC 125 MG IJ SOLR
125.0000 mg | Freq: Once | INTRAMUSCULAR | Status: AC
  Administered 2023-10-07: 125 mg via INTRAVENOUS
  Filled 2023-10-07: qty 2

## 2023-10-07 MED ORDER — HEPARIN SOD (PORK) LOCK FLUSH 100 UNIT/ML IV SOLN: 500.0000 [IU] | Freq: Once | INTRAVENOUS | Status: DC

## 2023-10-07 MED ORDER — ONDANSETRON HCL 4 MG/2ML IJ SOLN: 4.0000 mg | Freq: Four times a day (QID) | INTRAMUSCULAR | Status: DC | PRN

## 2023-10-07 MED ORDER — CHLORHEXIDINE GLUCONATE CLOTH 2 % EX PADS
6.0000 | MEDICATED_PAD | Freq: Every day | CUTANEOUS | Status: DC
  Administered 2023-10-07 – 2023-10-18 (×11): 6 via TOPICAL

## 2023-10-07 MED ORDER — HYDROMORPHONE HCL 1 MG/ML IJ SOLN
0.5000 mg | INTRAMUSCULAR | Status: DC | PRN
  Administered 2023-10-18 – 2023-10-20 (×4): 0.5 mg via INTRAVENOUS
  Filled 2023-10-07 (×5): qty 0.5

## 2023-10-07 MED ORDER — SODIUM CHLORIDE 0.9% FLUSH
10.0000 mL | Freq: Two times a day (BID) | INTRAVENOUS | Status: DC
  Administered 2023-10-07 – 2023-10-25 (×29): 10 mL

## 2023-10-07 MED ORDER — MEGESTROL ACETATE 400 MG/10ML PO SUSP: 400.0000 mg | Freq: Two times a day (BID) | ORAL | Status: DC

## 2023-10-07 MED ORDER — ACETAMINOPHEN 325 MG PO TABS
650.0000 mg | ORAL_TABLET | Freq: Four times a day (QID) | ORAL | Status: DC | PRN
  Administered 2023-10-18 – 2023-10-19 (×3): 650 mg via ORAL
  Filled 2023-10-07 (×4): qty 2

## 2023-10-07 NOTE — Progress Notes (Signed)
Radiation Oncology         [phone] ________________________________  Name: David [name] MRN: [id number]  Date: 10/07/2023  DOB: June 02, 1951  Chart Note:  I received message about this patient and reviewed this patient's most recent findings and wanted to take a minute to document my impression.  He has substantial spinal metastases at T7-T8 and T12 that may benefit from palliative radiotherapy to alleviate pain and preserve spinal cord function.  On transfer from [name] to [facility], we'll evaluate him for consideration of urgent treatment.  Radiographic Findings: CT Angio Chest PE W and/or Wo Contrast Result Date: 10/07/2023 CLINICAL DATA:  73 year old male with shortness of breath. Diarrhea. History of renal cell carcinoma status post left nephrectomy. * Tracking Code: BO * EXAM: CT ANGIOGRAPHY CHEST WITH CONTRAST TECHNIQUE: Multidetector CT imaging of the chest was performed using the standard protocol during bolus administration of intravenous contrast. Multiplanar CT image reconstructions and MIPs were obtained to evaluate the vascular anatomy. RADIATION DOSE REDUCTION: This exam was performed according to the departmental dose-optimization program which includes automated exposure control, adjustment of the mA and/or kV according to patient size and/or use of iterative reconstruction technique. CONTRAST:  75mL OMNIPAQUE IOHEXOL 350 MG/ML SOLN COMPARISON:  Portable chest noon today. PET-CT 05/06/2023 FINDINGS: Cardiovascular: Good contrast bolus timing in the pulmonary arterial tree. No pulmonary artery filling defect identified. Calcified aortic atherosclerosis. Heart size remains normal. No pericardial effusion. Right chest Port-A-Cath in place. Mediastinum/Nodes: Lower posterior mediastinal, prevertebral malignant appearing lymphadenopathy (17 mm short axis series 4, image 86) in association with severe spinal metastases, see details below. Other mediastinal nodal stations remain  within normal limits. Lungs/Pleura: Small, trace layering pleural effusions greater on the right. Upper Abdomen: Multiple hypodense liver masses are new compared to a CT abdomen on 06/20/2022, ranging from 10 mm up to 23 mm diameter. Visible spleen, pancreas, right adrenal gland, right kidney, and bowel appear within normal limits. Musculoskeletal: Severe spinal metastatic disease which has substantially progressed since the August PET-CT at that time only small T12 vertebral and left 10th rib lesions. But large lytic, destructive, expansile osseous lesions now occupy the T7 vertebra, costovertebral junction, T8 vertebra (with mild pathologic fracture), 8th rib costochondral junction, and destroyed a the posterior right 8th rib. Bulky tumor there encompasses up to 11 cm long axis (series 4, image 60). And there is evidence of tumor invasion into the thoracic spinal canal at both levels. T8 spinal stenosis is possible (series 4, image 59). Additional left eccentric T12 destructive and expansile metastasis with mild T12 pathologic fracture and spinal canal involvement (series 4, image 95 likely with spinal cord mass effect). Destruction of the left T12 transverse process. Early extension into the left T11 vertebra there. Involvement of the left T12 costovertebral junction. Chronic enlargement and heterogeneity of the distal left clavicle is stable. Review of the MIP images confirms the above findings. IMPRESSION: 1. Severe Thoracic Spinal Metastatic Disease. Marked progression since an August PET-CT with four levels affected (T7, T8, T11, T12) pathologic vertebral fractures, destruction of the posterior right 8th rib, and tumor extension into the spinal canal. Associated prevertebral lymph node metastases. 2. Multifocal liver metastases, new since 2023 and individually up to 2.3 cm. 3. Trace layering pleural effusions. No pulmonary embolus identified. 4.  Aortic Atherosclerosis (ICD10-I70.0). Salient findings  discussed by telephone with PA CHRISTOPHER GROCE on 10/07/2023 at 13:19 . Electronically Signed   By: Odessa Fleming M.D.   On: 10/07/2023 13:23   DG  Chest Port 1 View Result Date: 10/07/2023 CLINICAL DATA:  Shortness of breath. EXAM: PORTABLE CHEST 1 VIEW COMPARISON:  Chest x-ray dated September 30, 2023. FINDINGS: Unchanged right chest wall port catheter. The heart size and mediastinal contours are within normal limits. Normal pulmonary vascularity. No focal consolidation, pleural effusion, or pneumothorax. No acute osseous abnormality. IMPRESSION: 1. No active disease. Electronically Signed   By: Obie Dredge M.D.   On: 10/07/2023 12:26   DG Chest Port 1 View Result Date: 09/30/2023 CLINICAL DATA:  Shortness of breath EXAM: PORTABLE CHEST 1 VIEW COMPARISON:  01/25/2023 FINDINGS: Right Port-A-Cath remains in place, unchanged. Heart and mediastinal contours are within normal limits. No focal opacities or effusions. No acute bony abnormality. Old healed left clavicle and rib fractures. IMPRESSION: No active disease. Electronically Signed   By: Charlett Nose M.D.   On: 09/30/2023 11:28    ________________________________  Artist Pais Kathrynn Running, M.D.

## 2023-10-07 NOTE — H&P (Addendum)
Patient Demographics:    David [name], is a 73 y.o. male  MRN: [id number]   DOB - 30-Jan-1951  Admit Date - 10/07/2023  Outpatient Primary MD for the patient is Assunta Found, MD   Assessment & Plan:   Assessment and Plan:  1) Metastatic left kidney clear-cell RCC, s/p Left robotic assisted laparoscopic radical nephrectomy by Dr. Ronne Binning on 08/13/2022  ---Treated with Lenvatinib 14 mg daily started on 01/18/2023, Keytruda on 01/22/2023, dose decreased to 10 mg on 01/28/2023 due to elevated blood pressure, discontinued due to progression on PET scan on 05/06/2023, - Cabozantinib 40 mg daily started on 06/24/2023 --Patient sees oncologist Dr. Ellin Saba in [facility] -CTA Chest on 10/07/2023 shows--Severe Thoracic Spinal Metastatic Disease. Marked progression since an August PET-CT with four levels affected (T7, T8, T11, T12) pathologic vertebral fractures, destruction of the posterior right 8th rib, and tumor extension into the spinal canal. Associated prevertebral lymph node metastases. 2. Multifocal liver metastases, new since 2023 and individually up to 2.3 cm. 3. Trace layering pleural effusions. No pulmonary embolus identified. --Patient with significant back/thoracic pain--Solu-Medrol as ordered, as needed opiates as ordered -Case discussed with radiation oncologist Dr. Margaretmary Dys who reviewed patient's chart including imaging studies and plans to evaluate patient when patient gets to the [facility]   2.  Social/Ethics--- - Lives by himself and is independent of ADLs and IADLs.  He does not drive.  Prior to retirement, he worked at [employer] places  -Plan of care discussed with the patient and Sister Eula Fried and Brother in Social worker who indicate understanding and agree with the plan   --Official palliative care consult requested to further delineate goals of care and discuss advanced directives further --patient is currently full code   3)Anorexia--- in the setting of underlying malignancy  -patient does not want to take Megace -Patient having sleep with insomnia we will try Remeron for appetite stimulation and sleep -Ensure and other nutritional supplements advised  4)Diarrhea--- not new per patient present for over a week -Check stool studies -Consider Imodium if stool studies are negative for infectious etiology -May be related to ongoing chemo treatments  5)Lactic Acidosis with anion gap metabolic acidosis--initial lactic acid> 9.0, repeat after IVF 3.6--suspect due to dehydration in the setting of ongoing diarrhea,  -Bicarb is down to 15, anion gap is 19--in the setting of ongoing diarrhea -UA is not suggestive of UTI -WBCs 7.3,  --No fever  Or chills , no vomiting -COVID flu and RSV negative -CTA chest without evidence of acute infection -No evidence of sepsis--blood cultures obtained--hold off on antibiotics -- Give additional IV fluids and repeat lactic acid  6)AKI----acute kidney injury  --due to ongoing diarrhea and poor oral intake ---Creatinine is up to 1.4 from a baseline usually around 1.1 -- renally adjust medications, avoid nephrotoxic agents / dehydration  / hypotension   7)Tobacco abuse-patient has smoked since he was 73 years old  -give nicotine patch -  8)HTN--hold lisinopril due to AKI -Continue amlodipine -IV labetalol as needed elevated BP  9) chronic anemia--in the setting of underlying malignancy and chemotherapy --Hemoglobin 11.8 which is close to baseline -No bleeding concerns at this time  Status is: Inpatient  Dispo: The patient is from: Home              Anticipated d/c is to: Home              Anticipated d/c date is: > 3 days              Patient currently is not medically stable to d/c. 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 Ronal Fear note   Pneumonia    Seizure [facility])       Past Surgical History:  Procedure Laterality Date   COLONOSCOPY  05/31/2012   Procedure: COLONOSCOPY;  Surgeon: Dalia Heading, MD;  Location: [facility]ITE;  Service: Gastroenterology;  Laterality: N/A;   PORTACATH PLACEMENT Right 01/27/2023   Procedure: INSERTION PORT-A-CATH, RIJ;  Surgeon: Lewie Chamber, DO;  Location: [facility] ORS;  Service: General;  Laterality: Right;   ROBOT ASSISTED LAPAROSCOPIC NEPHRECTOMY Left 08/13/2022   Procedure: XI ROBOTIC ASSISTED LAPAROSCOPIC NEPHRECTOMY;  Surgeon: Malen Gauze, MD;  Location: [facility] ORS;  Service: Urology;  Laterality: Left;   TONSILLECTOMY      Chief Complaint  Patient presents with   Diarrhea      HPI:    David [name]  is a 73 y.o. male smoker with past medical history relevant for history of hep C previously treated with antivirals, HTN, and  Metastatic left kidney clear-cell RCC, s/p Left robotic assisted laparoscopic radical nephrectomy by Dr. Ronne Binning on 08/13/2022 with Pathology: at that time showing 7 cm clear-cell RCC with focal sarcomatoid and rhabdoid features, nuclear grade 4.  Tumor extended into the renal vein and renal sinus fat (PT3a).    - PET scan (12/24/2022): Enlarging hypermetabolic left adrenal metastasis.  2 small hypermetabolic soft tissue nodules in the left nephrectomy bed.  Hypermetabolic lytic lesions involving distal left clavicle and left temporal bone.  Indeterminate small hypermetabolic parotid nodules bilaterally. -Treated with Lenvatinib 14 mg daily started on 01/18/2023, Keytruda on 01/22/2023, dose decreased to 10 mg on 01/28/2023 due to elevated blood pressure, discontinued due to progression on PET scan on 05/06/2023,  - Cabozantinib 40 mg daily started on 06/24/2023 -Sent over from oncology office to ED on 10/07/2023 due to ongoing diarrhea and back /thoracic area pain  -CTA Chest shows--Severe Thoracic Spinal Metastatic Disease. Marked progression since an August PET-CT with four levels affected (T7, T8, T11, T12) pathologic vertebral fractures, destruction of the posterior right 8th rib, and tumor extension into the spinal canal. Associated prevertebral lymph node metastases. 2. Multifocal liver metastases, new since 2023 and individually up to 2.3 cm. 3. Trace layering pleural effusions. No pulmonary embolus identified. Lactic Acid > 9.0, repeat after IVF 3.6--suspect due to dehydration in the setting of ongoing diarrhea,  -Creatinine is up to 1.4 from a baseline usually around 1.1 -Bicarb is down to 15, anion gap is 19--in the setting of ongoing diarrhea -UA is not suggestive of UTI -WBCs 7.3 -Hemoglobin 11.8 which is close to baseline No  fever  Or chills , no vomiting -COVID flu and RSV negative -Magnesium 1 potassium WNL, LFTs are not elevated -Case discussed with radiation oncologist Dr. Margaretmary Dys who reviewed patient's chart including imaging studies and plans to evaluate patient when patient gets to the [facility]   Review of systems:    In addition to the HPI above,   A full Review of  Systems was done, all other systems reviewed are negative except as noted above in HPI , .    Social History:  Reviewed by me    Social History   Tobacco Use   Smoking status: Every Day    Current packs/day: 0.50    Average packs/day: 0.5 packs/day for 65.0 years (32.5 ttl pk-yrs)    Types: Cigarettes   Smokeless tobacco: Never  Substance Use Topics   Alcohol use: No     Family History :  Reviewed by me HTN   Home Medications:   Prior to Admission medications   Medication Sig Start Date End Date Taking? Authorizing Provider  amLODipine (NORVASC) 10 MG tablet Take 1 tablet (10 mg  total) by mouth daily. 07/12/23  Yes Doreatha Massed, MD  cabozantinib (CABOMETYX) 40 MG tablet Take 1 tablet (40 mg total) by mouth daily. Take on an empty stomach, 1 hour before or 2 hours after meals. 08/11/23  Yes Doreatha Massed, MD  FLUZONE HIGH-DOSE 0.5 ML injection  06/30/23  Yes [provider]  levETIRAcetam (KEPPRA) 500 MG tablet Take by mouth. 03/12/23  Yes [provider]  [facility] syringe  06/30/23  Yes [provider]  tamsulosin (FLOMAX) 0.4 MG CAPS capsule Take 0.4 mg by mouth daily.    Yes [provider]  Clobetasol Prop Emollient Base (CLOBETASOL PROPIONATE E) 0.05 % emollient cream Apply 1 Application topically 2 (two) times daily. 05/04/23   Doreatha Massed, MD  lidocaine (XYLOCAINE) 5 % ointment Apply 1 Application topically 2 (two) times daily as needed for moderate pain. 05/04/23   Doreatha Massed, MD  lidocaine-prilocaine (EMLA) cream Apply to affected area once Patient not taking: Reported on 10/07/2023 01/22/23   Doreatha Massed, MD  lisinopril (ZESTRIL) 10 MG tablet Take by mouth. 03/13/23 09/06/23  [provider]  megestrol (MEGACE) 400 MG/10ML suspension Take 10 mLs (400 mg total) by mouth 2 (two) times daily. Patient not taking: Reported on 10/07/2023 09/06/23   Doreatha Massed, MD  prochlorperazine (COMPAZINE) 10 MG tablet Take 1 tablet (10 mg total) by mouth every 6 (six) hours as needed for nausea or vomiting. Patient not taking: Reported on 10/07/2023 01/22/23   Doreatha Massed, MD  triamcinolone cream (KENALOG) 0.1 % SMARTSIG:1 Application Topical 2-3 Times Daily Patient not taking: Reported on 10/07/2023 05/11/23   [provider]     Allergies:    No Known Allergies   Physical Exam:   Vitals  Blood pressure (!) 151/89, pulse 87, temperature 98.1 F (36.7 C), temperature source Oral, resp. rate (!) 27, height 5\' 11"  (1.803 m), weight 89.8 kg, SpO2 97%.  Physical  Examination: General appearance - alert,  in no distress  Mental status - alert, oriented to person, place, and time,  Eyes - sclera anicteric Neck - supple, no JVD elevation , Chest - clear  to auscultation bilaterally, symmetrical air movement,  Heart - S1 and S2 normal, regular, Rt sided [place] Abdomen - soft, nontender, nondistended, +BS Neurological - screening mental status exam normal, neck supple without rigidity, cranial nerves II through XII intact, DTR's normal and  symmetric Extremities - no pedal edema noted, intact peripheral pulses  Skin - warm, dry MSK--Thoraxic Spine tenderness without crepitus or step deformity, overlying skin without erythema   Data Review:    CBC Recent Labs  Lab 10/07/23 0938 10/07/23 1026  WBC 7.5 7.3  HGB 11.9* 11.8*  HCT 39.5 36.6*  PLT 220 226  MCV 107.0* 103.4*  MCH 32.2 33.3  MCHC 30.1 32.2  RDW 28.5* 28.2*  LYMPHSABS 3.5 3.4  MONOABS 0.8 0.8  EOSABS 0.0 0.0  BASOSABS 0.0 0.0  ------------------------------------------------------------------------------------------------------------------ Chemistries  Recent Labs  Lab 10/07/23 0938 10/07/23 1026  NA 140 140  K 4.3 4.3  CL 107 106  CO2 13* 15*  GLUCOSE 144* 144*  BUN 25* 25*  CREATININE 1.34* 1.40*  CALCIUM 9.5 9.5  MG 2.2  --   AST 35 35  ALT 30 29  ALKPHOS 119 121  BILITOT 1.1 1.3*   ------------------------------------------------------------------------------------------------------------------ estimated creatinine clearance is 51.5 mL/min (A) (by C-G formula based on SCr of 1.4 mg/dL (H)). ------------------------------------------------------------------------------------------------------------------ Coagulation profile Recent Labs  Lab 10/07/23 1026  INR 1.1   ------------------------------------------------------------------------------------------------------------------- Recent Labs    10/07/23 1058  DDIMER 2.17*    ------------------------------------------------------------------------------------------------------------------ Urinalysis    Component Value Date/Time   COLORURINE YELLOW 10/07/2023 1415   APPEARANCEUR CLEAR 10/07/2023 1415   APPEARANCEUR Clear 11/20/2022 1053   LABSPEC 1.030 10/07/2023 1415   PHURINE 5.0 10/07/2023 1415   GLUCOSEU NEGATIVE 10/07/2023 1415   HGBUR NEGATIVE 10/07/2023 1415   BILIRUBINUR NEGATIVE 10/07/2023 1415   BILIRUBINUR Negative 11/20/2022 1053   KETONESUR NEGATIVE 10/07/2023 1415   PROTEINUR NEGATIVE 10/07/2023 1415   NITRITE NEGATIVE 10/07/2023 1415   LEUKOCYTESUR NEGATIVE 10/07/2023 1415    Imaging Results:    CT Angio Chest PE W and/or Wo Contrast Result Date: 10/07/2023 CLINICAL DATA:  73 year old male with shortness of breath. Diarrhea. History of renal cell carcinoma status post left nephrectomy. * Tracking Code: BO * EXAM: CT ANGIOGRAPHY CHEST WITH CONTRAST TECHNIQUE: Multidetector CT imaging of the chest was performed using the standard protocol during bolus administration of intravenous contrast. Multiplanar CT image reconstructions and MIPs were obtained to evaluate the vascular anatomy. RADIATION DOSE REDUCTION: This exam was performed according to the departmental dose-optimization program which includes automated exposure control, adjustment of the mA and/or kV according to patient size and/or use of iterative reconstruction technique. CONTRAST:  75mL OMNIPAQUE IOHEXOL 350 MG/ML SOLN COMPARISON:  Portable chest noon today. PET-CT 05/06/2023 FINDINGS: Cardiovascular: Good contrast bolus timing in the pulmonary arterial tree. No pulmonary artery filling defect identified. Calcified aortic atherosclerosis. Heart size remains normal. No pericardial effusion. Right chest Port-A-Cath in place. Mediastinum/Nodes: Lower posterior mediastinal, prevertebral malignant appearing lymphadenopathy (17 mm short axis series 4, image 86) in association with severe spinal  metastases, see details below. Other mediastinal nodal stations remain within normal limits. Lungs/Pleura: Small, trace layering pleural effusions greater on the right. Upper Abdomen: Multiple hypodense liver masses are new compared to a CT abdomen on 06/20/2022, ranging from 10 mm up to 23 mm diameter. Visible spleen, pancreas, right adrenal gland, right kidney, and bowel appear within normal limits. Musculoskeletal: Severe spinal metastatic disease which has substantially progressed since the August PET-CT at that time only small T12 vertebral and left 10th rib lesions. But large lytic, destructive, expansile osseous lesions now occupy the T7 vertebra, costovertebral junction, T8 vertebra (with mild pathologic fracture), 8th rib costochondral junction, and destroyed a the posterior right 8th rib. Bulky tumor there encompasses up to 11  cm long axis (series 4, image 60). And there is evidence of tumor invasion into the thoracic spinal canal at both levels. T8 spinal stenosis is possible (series 4, image 59). Additional left eccentric T12 destructive and expansile metastasis with mild T12 pathologic fracture and spinal canal involvement (series 4, image 95 likely with spinal cord mass effect). Destruction of the left T12 transverse process. Early extension into the left T11 vertebra there. Involvement of the left T12 costovertebral junction. Chronic enlargement and heterogeneity of the distal left clavicle is stable. Review of the MIP images confirms the above findings. IMPRESSION: 1. Severe Thoracic Spinal Metastatic Disease. Marked progression since an August PET-CT with four levels affected (T7, T8, T11, T12) pathologic vertebral fractures, destruction of the posterior right 8th rib, and tumor extension into the spinal canal. Associated prevertebral lymph node metastases. 2. Multifocal liver metastases, new since 2023 and individually up to 2.3 cm. 3. Trace layering pleural effusions. No pulmonary embolus  identified. 4.  Aortic Atherosclerosis (ICD10-I70.0). Salient findings discussed by telephone with PA CHRISTOPHER GROCE on 10/07/2023 at 13:19 . Electronically Signed   By: Odessa Fleming M.D.   On: 10/07/2023 13:23   DG Chest Port 1 View Result Date: 10/07/2023 CLINICAL DATA:  Shortness of breath. EXAM: PORTABLE CHEST 1 VIEW COMPARISON:  Chest x-ray dated September 30, 2023. FINDINGS: Unchanged right chest wall port catheter. The heart size and mediastinal contours are within normal limits. Normal pulmonary vascularity. No focal consolidation, pleural effusion, or pneumothorax. No acute osseous abnormality. IMPRESSION: 1. No active disease. Electronically Signed   By: Obie Dredge M.D.   On: 10/07/2023 12:26    Radiological Exams on Admission: CT Angio Chest PE W and/or Wo Contrast Result Date: 10/07/2023 CLINICAL DATA:  73 year old male with shortness of breath. Diarrhea. History of renal cell carcinoma status post left nephrectomy. * Tracking Code: BO * EXAM: CT ANGIOGRAPHY CHEST WITH CONTRAST TECHNIQUE: Multidetector CT imaging of the chest was performed using the standard protocol during bolus administration of intravenous contrast. Multiplanar CT image reconstructions and MIPs were obtained to evaluate the vascular anatomy. RADIATION DOSE REDUCTION: This exam was performed according to the departmental dose-optimization program which includes automated exposure control, adjustment of the mA and/or kV according to patient size and/or use of iterative reconstruction technique. CONTRAST:  75mL OMNIPAQUE IOHEXOL 350 MG/ML SOLN COMPARISON:  Portable chest noon today. PET-CT 05/06/2023 FINDINGS: Cardiovascular: Good contrast bolus timing in the pulmonary arterial tree. No pulmonary artery filling defect identified. Calcified aortic atherosclerosis. Heart size remains normal. No pericardial effusion. Right chest Port-A-Cath in place. Mediastinum/Nodes: Lower posterior mediastinal, prevertebral malignant appearing  lymphadenopathy (17 mm short axis series 4, image 86) in association with severe spinal metastases, see details below. Other mediastinal nodal stations remain within normal limits. Lungs/Pleura: Small, trace layering pleural effusions greater on the right. Upper Abdomen: Multiple hypodense liver masses are new compared to a CT abdomen on 06/20/2022, ranging from 10 mm up to 23 mm diameter. Visible spleen, pancreas, right adrenal gland, right kidney, and bowel appear within normal limits. Musculoskeletal: Severe spinal metastatic disease which has substantially progressed since the August PET-CT at that time only small T12 vertebral and left 10th rib lesions. But large lytic, destructive, expansile osseous lesions now occupy the T7 vertebra, costovertebral junction, T8 vertebra (with mild pathologic fracture), 8th rib costochondral junction, and destroyed a the posterior right 8th rib. Bulky tumor there encompasses up to 11 cm long axis (series 4, image 60). And there is evidence of tumor invasion  into the thoracic spinal canal at both levels. T8 spinal stenosis is possible (series 4, image 59). Additional left eccentric T12 destructive and expansile metastasis with mild T12 pathologic fracture and spinal canal involvement (series 4, image 95 likely with spinal cord mass effect). Destruction of the left T12 transverse process. Early extension into the left T11 vertebra there. Involvement of the left T12 costovertebral junction. Chronic enlargement and heterogeneity of the distal left clavicle is stable. Review of the MIP images confirms the above findings. IMPRESSION: 1. Severe Thoracic Spinal Metastatic Disease. Marked progression since an August PET-CT with four levels affected (T7, T8, T11, T12) pathologic vertebral fractures, destruction of the posterior right 8th rib, and tumor extension into the spinal canal. Associated prevertebral lymph node metastases. 2. Multifocal liver metastases, new since 2023 and  individually up to 2.3 cm. 3. Trace layering pleural effusions. No pulmonary embolus identified. 4.  Aortic Atherosclerosis (ICD10-I70.0). Salient findings discussed by telephone with PA CHRISTOPHER GROCE on 10/07/2023 at 13:19 . Electronically Signed   By: Odessa Fleming M.D.   On: 10/07/2023 13:23   DG Chest Port 1 View Result Date: 10/07/2023 CLINICAL DATA:  Shortness of breath. EXAM: PORTABLE CHEST 1 VIEW COMPARISON:  Chest x-ray dated September 30, 2023. FINDINGS: Unchanged right chest wall port catheter. The heart size and mediastinal contours are within normal limits. Normal pulmonary vascularity. No focal consolidation, pleural effusion, or pneumothorax. No acute osseous abnormality. IMPRESSION: 1. No active disease. Electronically Signed   By: Obie Dredge M.D.   On: 10/07/2023 12:26    DVT Prophylaxis -SCD/Heparin subcu AM Labs Ordered, also please review Full Orders  Family Communication: Admission, patients condition and plan of care including tests being ordered have been discussed with the patient and Sister Eula Fried and Brother in Social worker who indicate understanding and agree with the plan   Condition  -stable  Shon Hale M.D on 10/07/2023 at 4:27 PM Go to [email or web address] -  for contact info  [facility] - Office  [phone]

## 2023-10-07 NOTE — Progress Notes (Signed)
Palliative:  Per secure chat; Dr. Ellin Saba would like the patient transferred to [facility] for radiation oncology and then a discussion with palliative care.   No charge  Lillia Carmel, NP Palliative Medicine Team  Team Phone [phone]

## 2023-10-07 NOTE — ED Notes (Signed)
Both sets of blood cultures drawn before any antibiotic administration  

## 2023-10-07 NOTE — ED Notes (Signed)
Pt placed on 2L NC for comfort at this time.  

## 2023-10-07 NOTE — ED Notes (Signed)
Pt informed of need for urine specimen, pt attempted to give a urine sample but was unable to do so at this time

## 2023-10-07 NOTE — ED Notes (Signed)
Patient transported to CT 

## 2023-10-07 NOTE — Progress Notes (Signed)
Patient presents for port flush with labs per provider's order. Patient c/o severe back pain, diarrhea, and shortness of breath. Dr.Katragadda made aware. Charge nurse transported patient to the ED via wheelchair accompanied by family member.

## 2023-10-07 NOTE — ED Triage Notes (Signed)
Pt arrived via POV and was upstairs for routine blood work, but due to difficulty breathing the Pt was sent downstairs to check into [facility] for SOB and diarrhea.

## 2023-10-07 NOTE — ED Provider Notes (Signed)
[place] EMERGENCY DEPARTMENT AT [facility] Provider Note   CSN: [id number] Arrival date & time: 10/07/23  1009     History  Chief Complaint  Patient presents with   Diarrhea    David [name] is a 73 y.o. male with medical history of renal cell carcinoma currently on chemotherapy, CKD, seizures, OSA.  Patient presents to ED for evaluation of shortness of breath and diarrhea.  Patient reports he has chronic shortness of breath.  Patient states he has smoked a cigarette since he was 73 years old.  Patient was upstairs today for blood draw secondary to his renal cell carcinoma when he was noted to be tachypneic and experiencing respiratory distress.  The patient was sent down to the ED for further management.  Patient arrives tachypneic, tachycardic to the 130s.  He reports that he does feel short of breath but states he feels like this "all the time".  His family members at the bedside reports that the patient has been seeming to "go downhill" in terms of his breathing ever since Thanksgiving.  The patient reports he still currently smokes.  He denies chest pain, nausea, vomiting.  He is endorsing lightheadedness and dizziness worse with standing.  Denies a history of blood clots, denies anticoagulation.  Denies leg swelling, calf pain.  He denies nausea, vomiting or abdominal pain but states he has been having diarrhea ever since starting his chemotherapy medication.  Denies any fevers at home.   Diarrhea Associated symptoms: no abdominal pain, no fever and no vomiting        Home Medications Prior to Admission medications   Medication Sig Start Date End Date Taking? Authorizing Provider  amLODipine (NORVASC) 10 MG tablet Take 1 tablet (10 mg total) by mouth daily. 07/12/23  Yes Doreatha Massed, MD  cabozantinib (CABOMETYX) 40 MG tablet Take 1 tablet (40 mg total) by mouth daily. Take on an empty stomach, 1 hour before or 2 hours after meals. 08/11/23  Yes Doreatha Massed, MD  FLUZONE HIGH-DOSE 0.5 ML injection  06/30/23  Yes [provider]  [facility] syringe  06/30/23  Yes [provider]  tamsulosin (FLOMAX) 0.4 MG CAPS capsule Take 0.4 mg by mouth daily.    Yes [provider]  Clobetasol Prop Emollient Base (CLOBETASOL PROPIONATE E) 0.05 % emollient cream Apply 1 Application topically 2 (two) times daily. 05/04/23   Doreatha Massed, MD  levETIRAcetam (KEPPRA) 500 MG tablet Take by mouth. Patient not taking: Reported on 10/07/2023 03/12/23   [provider]  lidocaine (XYLOCAINE) 5 % ointment Apply 1 Application topically 2 (two) times daily as needed for moderate pain. 05/04/23   Doreatha Massed, MD  lidocaine-prilocaine (EMLA) cream Apply to affected area once Patient not taking: Reported on 10/07/2023 01/22/23   Doreatha Massed, MD  lisinopril (ZESTRIL) 10 MG tablet Take by mouth. 03/13/23 09/06/23  [provider]  megestrol (MEGACE) 400 MG/10ML suspension Take 10 mLs (400 mg total) by mouth 2 (two) times daily. Patient not taking: Reported on 10/07/2023 09/06/23   Doreatha Massed, MD  prochlorperazine (COMPAZINE) 10 MG tablet Take 1 tablet (10 mg total) by mouth every 6 (six) hours as needed for nausea or vomiting. Patient not taking: Reported on 10/07/2023 01/22/23   Doreatha Massed, MD  triamcinolone cream (KENALOG) 0.1 % SMARTSIG:1 Application Topical 2-3 Times Daily Patient not taking: Reported on 10/07/2023 05/11/23   [provider]      Allergies    Patient has no known allergies.  Review of Systems   Review of Systems  Constitutional:  Negative for fever.  Respiratory:  Positive for shortness of breath.   Cardiovascular:  Negative for chest pain and leg swelling.  Gastrointestinal:  Positive for diarrhea. Negative for abdominal pain, nausea and vomiting.  Neurological:  Positive for dizziness and light-headedness.  All other systems reviewed and are  negative.   Physical Exam Updated Vital Signs BP (!) 171/97   Pulse 89   Temp 98 F (36.7 C) (Oral)   Resp 18   Ht 5\' 11"  (1.803 m)   Wt 89.8 kg   SpO2 97%   BMI 27.61 kg/m  Physical Exam Vitals and nursing note reviewed.  Constitutional:      General: He is not in acute distress.    Appearance: Normal appearance. He is not ill-appearing, toxic-appearing or diaphoretic.  HENT:     Head: Normocephalic and atraumatic.     Nose: Nose normal.     Mouth/Throat:     Mouth: Mucous membranes are moist.     Pharynx: Oropharynx is clear.  Eyes:     Extraocular Movements: Extraocular movements intact.     Conjunctiva/sclera: Conjunctivae normal.     Pupils: Pupils are equal, round, and reactive to light.  Cardiovascular:     Rate and Rhythm: Tachycardia present.  Pulmonary:     Effort: Pulmonary effort is normal.     Breath sounds: Normal breath sounds. No wheezing.     Comments: Distant lung sounds Abdominal:     General: Abdomen is flat. Bowel sounds are normal.     Palpations: Abdomen is soft.     Tenderness: There is no abdominal tenderness.  Musculoskeletal:     Cervical back: Normal range of motion and neck supple. No tenderness.  Skin:    General: Skin is warm and dry.     Capillary Refill: Capillary refill takes less than 2 seconds.  Neurological:     Mental Status: He is alert and oriented to person, place, and time.     Cranial Nerves: Cranial nerves 2-12 are intact. No cranial nerve deficit.     Sensory: Sensation is intact. No sensory deficit.     Motor: Motor function is intact. No weakness.     Coordination: Coordination is intact. Heel to [facility] Test normal.     Comments: 5/5 strength bilateral lower extremities      ED Results / Procedures / Treatments   Labs (all labs ordered are listed, but only abnormal results are displayed) Labs Reviewed  COMPREHENSIVE METABOLIC PANEL - Abnormal; Notable for the following components:      Result Value   CO2 15 (*)     Glucose, Bld 144 (*)    BUN 25 (*)    Creatinine, Ser 1.40 (*)    Total Bilirubin 1.3 (*)    GFR, Estimated 54 (*)    Anion gap 19 (*)    All other components within normal limits  LACTIC ACID, PLASMA - Abnormal; Notable for the following components:   Lactic Acid, Venous >9.0 (*)    All other components within normal limits  LACTIC ACID, PLASMA - Abnormal; Notable for the following components:   Lactic Acid, Venous 3.6 (*)    All other components within normal limits  CBC WITH DIFFERENTIAL/PLATELET - Abnormal; Notable for the following components:   RBC 3.54 (*)    Hemoglobin 11.8 (*)    HCT 36.6 (*)    MCV 103.4 (*)    RDW 28.2 (*)  nRBC 0.8 (*)    Abs Immature Granulocytes 0.12 (*)    All other components within normal limits  D-DIMER, QUANTITATIVE - Abnormal; Notable for the following components:   D-Dimer, Quant 2.17 (*)    All other components within normal limits  RESP PANEL BY RT-PCR (RSV, FLU A&B, COVID)  [id number]  CULTURE, BLOOD (ROUTINE X 2)  CULTURE, BLOOD (ROUTINE X 2)  GASTROINTESTINAL PANEL BY PCR, STOOL (REPLACES STOOL CULTURE)  C DIFFICILE QUICK SCREEN W PCR REFLEX    PROTIME-INR  URINALYSIS, W/ REFLEX TO CULTURE (INFECTION SUSPECTED)  BASIC METABOLIC PANEL  LACTIC ACID, PLASMA    EKG None  Radiology CT Angio Chest PE W and/or Wo Contrast Result Date: 10/07/2023 CLINICAL DATA:  73 year old male with shortness of breath. Diarrhea. History of renal cell carcinoma status post left nephrectomy. * Tracking Code: BO * EXAM: CT ANGIOGRAPHY CHEST WITH CONTRAST TECHNIQUE: Multidetector CT imaging of the chest was performed using the standard protocol during bolus administration of intravenous contrast. Multiplanar CT image reconstructions and MIPs were obtained to evaluate the vascular anatomy. RADIATION DOSE REDUCTION: This exam was performed according to the departmental dose-optimization program which includes automated exposure control, adjustment of the mA  and/or kV according to patient size and/or use of iterative reconstruction technique. CONTRAST:  75mL OMNIPAQUE IOHEXOL 350 MG/ML SOLN COMPARISON:  Portable chest noon today. PET-CT 05/06/2023 FINDINGS: Cardiovascular: Good contrast bolus timing in the pulmonary arterial tree. No pulmonary artery filling defect identified. Calcified aortic atherosclerosis. Heart size remains normal. No pericardial effusion. Right chest Port-A-Cath in place. Mediastinum/Nodes: Lower posterior mediastinal, prevertebral malignant appearing lymphadenopathy (17 mm short axis series 4, image 86) in association with severe spinal metastases, see details below. Other mediastinal nodal stations remain within normal limits. Lungs/Pleura: Small, trace layering pleural effusions greater on the right. Upper Abdomen: Multiple hypodense liver masses are new compared to a CT abdomen on 06/20/2022, ranging from 10 mm up to 23 mm diameter. Visible spleen, pancreas, right adrenal gland, right kidney, and bowel appear within normal limits. Musculoskeletal: Severe spinal metastatic disease which has substantially progressed since the August PET-CT at that time only small T12 vertebral and left 10th rib lesions. But large lytic, destructive, expansile osseous lesions now occupy the T7 vertebra, costovertebral junction, T8 vertebra (with mild pathologic fracture), 8th rib costochondral junction, and destroyed a the posterior right 8th rib. Bulky tumor there encompasses up to 11 cm long axis (series 4, image 60). And there is evidence of tumor invasion into the thoracic spinal canal at both levels. T8 spinal stenosis is possible (series 4, image 59). Additional left eccentric T12 destructive and expansile metastasis with mild T12 pathologic fracture and spinal canal involvement (series 4, image 95 likely with spinal cord mass effect). Destruction of the left T12 transverse process. Early extension into the left T11 vertebra there. Involvement of the left  T12 costovertebral junction. Chronic enlargement and heterogeneity of the distal left clavicle is stable. Review of the MIP images confirms the above findings. IMPRESSION: 1. Severe Thoracic Spinal Metastatic Disease. Marked progression since an August PET-CT with four levels affected (T7, T8, T11, T12) pathologic vertebral fractures, destruction of the posterior right 8th rib, and tumor extension into the spinal canal. Associated prevertebral lymph node metastases. 2. Multifocal liver metastases, new since 2023 and individually up to 2.3 cm. 3. Trace layering pleural effusions. No pulmonary embolus identified. 4.  Aortic Atherosclerosis (ICD10-I70.0). Salient findings discussed by telephone with PA Krissa Utke on 10/07/2023 at 13:19 . Electronically Signed  By: Odessa Fleming M.D.   On: 10/07/2023 13:23   DG Chest Port 1 View Result Date: 10/07/2023 CLINICAL DATA:  Shortness of breath. EXAM: PORTABLE CHEST 1 VIEW COMPARISON:  Chest x-ray dated September 30, 2023. FINDINGS: Unchanged right chest wall port catheter. The heart size and mediastinal contours are within normal limits. Normal pulmonary vascularity. No focal consolidation, pleural effusion, or pneumothorax. No acute osseous abnormality. IMPRESSION: 1. No active disease. Electronically Signed   By: Obie Dredge M.D.   On: 10/07/2023 12:26    Procedures Procedures   Medications Ordered in ED Medications  methylPREDNISolone sodium succinate (SOLU-MEDROL) 40 mg/mL injection 40 mg (has no administration in time range)  cabozantinib (CABOMETYX) tablet 40 mg (has no administration in time range)  amLODipine (NORVASC) tablet 10 mg (has no administration in time range)  tamsulosin (FLOMAX) capsule 0.4 mg (has no administration in time range)  sodium chloride flush (NS) 0.9 % injection 3 mL (has no administration in time range)  sodium chloride flush (NS) 0.9 % injection 3 mL (has no administration in time range)  sodium chloride flush (NS) 0.9 %  injection 3 mL (has no administration in time range)  0.9 %  sodium chloride infusion (has no administration in time range)  acetaminophen (TYLENOL) tablet 650 mg (has no administration in time range)    Or  acetaminophen (TYLENOL) suppository 650 mg (has no administration in time range)  bisacodyl (DULCOLAX) suppository 10 mg (has no administration in time range)  ondansetron (ZOFRAN) tablet 4 mg (has no administration in time range)    Or  ondansetron (ZOFRAN) injection 4 mg (has no administration in time range)  heparin injection 5,000 Units (has no administration in time range)  sodium chloride 0.9 % bolus 1,000 mL (has no administration in time range)  oxyCODONE (Oxy IR/ROXICODONE) immediate release tablet 5 mg (has no administration in time range)  HYDROmorphone (DILAUDID) injection 0.5 mg (has no administration in time range)  senna-docusate (Senokot-S) tablet 2 tablet (has no administration in time range)  feeding supplement (ENSURE ENLIVE / ENSURE PLUS) liquid 237 mL (has no administration in time range)  mirtazapine (REMERON) tablet 7.5 mg (has no administration in time range)  labetalol (NORMODYNE) injection 10 mg (has no administration in time range)  ipratropium-albuterol (DUONEB) 0.5-2.5 (3) MG/3ML nebulizer solution 3 mL (3 mLs Nebulization Given 10/07/23 1149)  methylPREDNISolone sodium succinate (SOLU-MEDROL) 125 mg/2 mL injection 125 mg (125 mg Intravenous Given 10/07/23 1058)  lactated ringers bolus 1,000 mL (0 mLs Intravenous Stopped 10/07/23 1405)    And  lactated ringers bolus 1,000 mL (0 mLs Intravenous Stopped 10/07/23 1437)    And  lactated ringers bolus 1,000 mL (0 mLs Intravenous Stopped 10/07/23 1605)  iohexol (OMNIPAQUE) 350 MG/ML injection 75 mL (75 mLs Intravenous Contrast Given 10/07/23 1222)  sodium chloride 0.9 % bolus 1,000 mL (1,000 mLs Intravenous Bolus 10/07/23 1620)    ED Course/ Medical Decision Making/ A&P Clinical Course as of 10/07/23 1725  Thu Oct 07, 2023  1318 Spinal metastatic disease with cord compression. In August had small T12 vertebral metatstais in August. Worsening progression, T7, T8 lytic metatstases, pathologic fracture. Liver mets. [CG]  1414 Admit to [facility], radiation oncology consultation. Palliative radiation done --> discuss hospice care. [CG]  1416 Call radiation oncology at [place] [CG]  1443 Spoke with Fabiola Backer [CG]    Clinical Course User Index [CG] Al Decant, PA-C   Medical Decision Making Amount and/or Complexity of Data Reviewed Labs: ordered.  Radiology: ordered.  Risk Prescription drug management. Decision regarding hospitalization.   73 year old male presents for evaluation.  Please see HPI for further details.  On examination patient is afebrile however is tachycardic up to 132 bpm.  Lung sounds are distant bilaterally, patient becomes hypoxic with exertion desatting down to 88% room air.  Patient placed on 2 L of oxygen via nasal cannula at this time.  Abdomen soft and compressible throughout.  Neurological examinations at baseline without focal neurodeficits.  5 out of 5 strength of bilateral lower extremities.  Due to tachycardia, tachypnea will collect CBC, CMP, lactic acid, viral panel, urinalysis, PT/INR, blood cultures.  Will also collect chest x-ray.  Patient provided Solu-Medrol, DuoNeb.  Also provided 1 L of LR for tachycardia.  Patient lactic acid greater than 9 on initial, provided with 1 L LR x 3.  Patient lactic acid on repeat decreased 3.6.  Patient CBC without white count elevation, hemoglobin is baseline at 8.8.  Patient metabolic panel shows creatinine 1.4 which is baseline, sodium WNL, potassium WNL, chloride WNL.  Bicarb decreased to 15, glucose 144, BUN 25.  No transaminitis.  Patient viral panel negative for all.  Urinalysis negative for all.  PT/INR WNL.  Chest x-ray unremarkable.  CTA shows no evidence of PE however unfortunately shows severe thoracic  spinal metastatic disease with marked progression since his August PET scan.  Therefore levels affected at T7, T8, T11 and T12 with pathologic vertebral fractures.  The tumor extends into the spinal canal.  There is associated prevertebral lymph node metastasis as well as liver metastasis.  Preceptor Dr. Ellin Saba of hematology oncology who recommends transferring patient to [facility] for radiation oncology.  Patient will then have palliative radiation done and will discuss hospice care.  Advised patient of imaging study findings.  Patient voiced understanding.  Patient admitted at this time to Dr. Mariea Clonts the [facility] hospitalist service.     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

## 2023-10-08 ENCOUNTER — Encounter (HOSPITAL_COMMUNITY): Payer: Self-pay

## 2023-10-08 ENCOUNTER — Ambulatory Visit
Admit: 2023-10-08 | Discharge: 2023-10-08 | Disposition: A | Discharge status: Home / Self Care | Payer: Medicare PPO | Attending: Radiation Oncology | Admitting: Radiation Oncology

## 2023-10-08 ENCOUNTER — Other Ambulatory Visit: Payer: Self-pay

## 2023-10-08 ENCOUNTER — Encounter (HOSPITAL_COMMUNITY): Admission: RE | Admit: 2023-10-08 | Payer: Medicare PPO | Source: Ambulatory Visit

## 2023-10-08 ENCOUNTER — Ambulatory Visit (HOSPITAL_COMMUNITY): Payer: Medicare PPO

## 2023-10-08 DIAGNOSIS — C7951 Secondary malignant neoplasm of bone: Secondary | ICD-10-CM

## 2023-10-08 LAB — BASIC METABOLIC PANEL
Anion gap: 11 (ref 5–15)
BUN: 20 mg/dL (ref 8–23)
CO2: 20 mmol/L — ABNORMAL LOW (ref 22–32)
Calcium: 8.1 mg/dL — ABNORMAL LOW (ref 8.9–10.3)
Chloride: 109 mmol/L (ref 98–111)
Creatinine, Ser: 0.9 mg/dL (ref 0.61–1.24)
GFR, Estimated: >60 mL/min (ref >60)
Glucose, Bld: 144 mg/dL — ABNORMAL HIGH (ref 70–99)
Potassium: 3.7 mmol/L (ref 3.5–5.1)
Sodium: 140 mmol/L (ref 135–145)

## 2023-10-08 LAB — C DIFFICILE QUICK SCREEN W PCR REFLEX
C Diff antigen: NEGATIVE
C Diff interpretation: NOT DETECTED
C Diff toxin: NEGATIVE

## 2023-10-08 LAB — RAD ONC ARIA SESSION SUMMARY

## 2023-10-08 LAB — GLUCOSE, CAPILLARY
Glucose-Capillary: 113 mg/dL — ABNORMAL HIGH (ref 70–99)
Glucose-Capillary: 128 mg/dL — ABNORMAL HIGH (ref 70–99)
Glucose-Capillary: 107 mg/dL — ABNORMAL HIGH (ref 70–99)

## 2023-10-08 MED ORDER — BOOST PLUS PO LIQD
237.0000 mL | Freq: Two times a day (BID) | ORAL | Status: DC
  Administered 2023-10-08 – 2023-10-25 (×22): 237 mL via ORAL
  Filled 2023-10-08 (×37): qty 237

## 2023-10-08 MED ORDER — DM-GUAIFENESIN ER 30-600 MG PO TB12
1.0000 | ORAL_TABLET | Freq: Two times a day (BID) | ORAL | Status: DC | PRN
  Administered 2023-10-08 – 2023-10-13 (×2): 1 via ORAL
  Filled 2023-10-08 (×2): qty 1

## 2023-10-08 NOTE — Plan of Care (Signed)
Problem: Health Behavior/Discharge Planning: Goal: Ability to manage health-related needs will improve Outcome: Progressing   Problem: Nutrition: Goal: Adequate nutrition will be maintained Outcome: Progressing

## 2023-10-08 NOTE — Progress Notes (Deleted)
Pt no show, reviewing chart pt went to ER yesterday and transferred to [facility].  Becky Sax, LPTA/CLT; CBIS [phone] Juel Burrow, PTA 10/08/2023, 9:08 AM

## 2023-10-08 NOTE — Consult Note (Signed)
WOC Nurse Consult Note: Reason for Consult:wounds Wound type: Stage 3 Pressure Injury: left buttock Stage 3 Pressure Injury: left buttock  Full thickness wound; left hip; non healing; not pressure related Pressure Injury POA: Yes Measurement: see nursing flow sheets Wound bed: left buttock; clean; pink, moist; mild fibrinous material.  Left hip; non granular; pale Drainage (amount, consistency, odor) see nursing flow sheet Periwound: intact, evidence of healing of other buttock wounds; scarring Dressing procedure/placement/frequency: Cleanse left hip and left buttock wounds with Vashe Hart [place] # [phone]), pat dry. Cut to fit silver hydrofiber and pack into wound beds, cover with ABD pads, secure with tape. Change daily due to location and potential for soilage.  Consult RD for wound supplementation  Turn and reposition per hospital policy   Discussed POC with patient and bedside nurse.  Re consult if needed, will not follow at this time. Thanks  Haleigh Desmith [employer], RN,CWOCN, CNS, CWON-AP [phone])

## 2023-10-08 NOTE — Progress Notes (Signed)
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 Malignant neoplasm metastatic to bone [facility])  C79.51       HISTORY OF PRESENT ILLNESS::David [name] is a 73 y.o. male who presented with with left shoulder pain. Weight loss of 50 pounds in the last 6 months but was trying to lose weight by cutting back on eating and doing some walking. CT Abd/Pelvis 06/20/22 showed 8 cm solid mass in anterior midpole of left kidney, highly suspicious for renal cell carcinoma. He underwent Left robotic assisted laparoscopic radical nephrectomy by Dr. Ronne Binning on 08/13/2022.  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  Tumor extends into the renal vein and renal sinus fat (PT3a).  Ureteral, vascular and all margins negative for tumor.  CT abd 11/16/22 showed left adrenal enlargement and an enlarging PA lymph node. PET 12/24/22 showed enlarging hypermetabolic left adrenal metastasis, two small hypermetabolic soft tissue nodules in the left nephrectomy bed, highly suspicious for local recurrence, hypermetabolic lytic lesions involving the distal left clavicle and left temporal bone, consistent with osseous metastases.  He started Lenvatinib 14 mg daily started on 01/18/2023, Keytruda on 01/22/2023, dose decreased to 10 mg on 01/28/2023 due to elevated blood pressure, discontinued due to progression on PET scan on 05/06/2023.  More recently treated with Cabozantinib 40 mg daily started on 06/24/2023  He presented yesterday with back pain and CT Chest showed Severe Thoracic Spinal Metastatic Disease. Marked progression since an August PET-CT with four levels affected  (T7, T8, T11, T12) pathologic vertebral fractures, destruction of the posterior right 8th rib, and tumor extension into the spinal canal.         It also showed associated prevertebral lymph node metastases.  Multifocal liver metastases, new since 2023 and individually up to 2.3 cm.  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 Ronal Fear note   Pneumonia    Seizure [facility])   :   Past Surgical History:  Procedure Laterality Date   COLONOSCOPY  05/31/2012   Procedure: COLONOSCOPY;  Surgeon: Dalia Heading, MD;  Location: [facility] SUITE;  Service: Gastroenterology;  Laterality: N/A;   PORTACATH PLACEMENT Right 01/27/2023   Procedure: INSERTION PORT-A-CATH, RIJ;  Surgeon: Lewie Chamber, DO;  Location: [facility] ORS;  Service: General;  Laterality: Right;   ROBOT ASSISTED LAPAROSCOPIC NEPHRECTOMY Left 08/13/2022   Procedure: XI ROBOTIC ASSISTED LAPAROSCOPIC NEPHRECTOMY;  Surgeon: Malen Gauze, MD;  Location: [facility] ORS;  Service: Urology;  Laterality: Left;   TONSILLECTOMY    :  No current facility-administered medications for this encounter. No current outpatient medications on file.  Facility-Administered Medications Ordered in Other Encounters:    0.9 %  sodium chloride infusion, , Intravenous, PRN, Emokpae, Courage, MD   acetaminophen (TYLENOL) tablet 650 mg, 650 mg, Oral, Q6H PRN **OR** acetaminophen (TYLENOL) suppository 650 mg, 650 mg, Rectal, Q6H PRN, Shon Hale, MD  amLODipine (NORVASC) tablet 10 mg, 10 mg, Oral, Daily, Emokpae, Courage, MD, 10 mg at 10/07/23 1826   bisacodyl (DULCOLAX) suppository 10 mg, 10 mg, Rectal, Daily PRN, Mariea Clonts, Courage, MD   cabozantinib (CABOMETYX) tablet 40 mg, 40 mg, Oral, Daily, Emokpae, Courage, MD    Chlorhexidine Gluconate Cloth 2 % PADS 6 each, 6 each, Topical, Daily, Emokpae, Courage, MD, 6 each at 10/07/23 2219   feeding supplement (ENSURE ENLIVE / ENSURE PLUS) liquid 237 mL, 237 mL, Oral, BID BM, Emokpae, Courage, MD   heparin injection 5,000 Units, 5,000 Units, Subcutaneous, Q8H, Emokpae, Courage, MD, 5,000 Units at 10/08/23 0547   HYDROmorphone (DILAUDID) injection 0.5 mg, 0.5 mg, Intravenous, Q4H PRN, Emokpae, Courage, MD   labetalol (NORMODYNE) injection 10 mg, 10 mg, Intravenous, Q4H PRN, Emokpae, Courage, MD   methylPREDNISolone sodium succinate (SOLU-MEDROL) 40 mg/mL injection 40 mg, 40 mg, Intravenous, Q12H, Emokpae, Courage, MD, 40 mg at 10/08/23 0755   mirtazapine (REMERON) tablet 7.5 mg, 7.5 mg, Oral, QHS, Emokpae, Courage, MD, 7.5 mg at 10/07/23 2218   ondansetron (ZOFRAN) tablet 4 mg, 4 mg, Oral, Q6H PRN **OR** ondansetron (ZOFRAN) injection 4 mg, 4 mg, Intravenous, Q6H PRN, Emokpae, Courage, MD   oxyCODONE (Oxy IR/ROXICODONE) immediate release tablet 5 mg, 5 mg, Oral, Q4H PRN, Mariea Clonts, Courage, MD, 5 mg at 10/07/23 1827   senna-docusate (Senokot-S) tablet 2 tablet, 2 tablet, Oral, QHS, Emokpae, Courage, MD, 2 tablet at 10/07/23 2218   sodium chloride 0.9 % bolus 1,000 mL, 1,000 mL, Intravenous, Once, Emokpae, Courage, MD   sodium chloride flush (NS) 0.9 % injection 10-40 mL, 10-40 mL, Intracatheter, Q12H, Emokpae, Courage, MD, 10 mL at 10/07/23 2219   sodium chloride flush (NS) 0.9 % injection 10-40 mL, 10-40 mL, Intracatheter, PRN, Emokpae, Courage, MD   sodium chloride flush (NS) 0.9 % injection 3 mL, 3 mL, Intravenous, Q12H, Emokpae, Courage, MD, 3 mL at 10/07/23 2219   sodium chloride flush (NS) 0.9 % injection 3 mL, 3 mL, Intravenous, Q12H, Emokpae, Courage, MD, 3 mL at 10/07/23 2219   sodium chloride flush (NS) 0.9 % injection 3 mL, 3 mL, Intravenous, PRN, Emokpae, Courage, MD   tamsulosin (FLOMAX) capsule 0.4 mg, 0.4 mg, Oral, Daily, Emokpae, Courage, MD, 0.4 mg at  10/07/23 1827:  No Known Allergies:  No family history on file.:   Social History   Socioeconomic History   Marital status: Divorced    Spouse name: Not on file   Number of children: Not on file   Years of education: Not on file   Highest education level: Not on file  Occupational History   Not on file  Tobacco Use   Smoking status: Every Day    Current packs/day: 0.50    Average packs/day: 0.5 packs/day for 65.0 years (32.5 ttl pk-yrs)    Types: Cigarettes   Smokeless tobacco: Never  Vaping Use   Vaping status: Never Used  Substance and Sexual Activity   Alcohol use: No   Drug use: No   Sexual activity: Not Currently  Other Topics Concern   Not on file  Social History Narrative   Not on file   Social Drivers of Health   Financial Resource Strain: Low Risk  (03/24/2023)   Received from [facility]   Overall Financial Resource Strain (CARDIA)    Difficulty of Paying Living Expenses: Not hard at all  Recent Concern: Financial Resource Strain - High Risk (02/01/2023)   Overall Financial Resource Strain (CARDIA)    Difficulty of Paying  Living Expenses: Very hard  Food Insecurity: No Food Insecurity (10/07/2023)   Hunger Vital Sign    Worried About Running Out of Food in the Last Year: Never true    Ran Out of Food in the Last Year: Never true  Transportation Needs: No Transportation Needs (10/07/2023)   PRAPARE - Administrator, Civil Service (Medical): No    Lack of Transportation (Non-Medical): No  Physical Activity: Not on file  Stress: Patient Declined (03/07/2023)   Received from [facility], [facility] of Occupational Health - Occupational Stress Questionnaire    Feeling of Stress : Patient declined  Social Connections: Unknown (03/07/2023)   Received from [facility], [facility]   Social Network    Social Network: Not on file  Intimate Partner Violence: Not At Risk (10/07/2023)   Humiliation, Afraid, Rape, and Kick  questionnaire    Fear of Current or Ex-Partner: No    Emotionally Abused: No    Physically Abused: No    Sexually Abused: No  :  REVIEW OF SYSTEMS:  A 15 point review of systems is documented in the electronic medical record. This was obtained by the nursing staff. However, I reviewed this with the patient to discuss relevant findings and make appropriate changes.  Pertinent items are noted in HPI.      PHYSICAL EXAM:  Per admission H&P Vitals   Blood pressure (!) 151/89, pulse 87, temperature 98.1 F (36.7 C), temperature source Oral, resp. rate (!) 27, height 5\' 11"  (1.803 m), weight 89.8 kg, SpO2 97%.   Physical Examination: General appearance - alert,  in no distress  Mental status - alert, oriented to person, place, and time,  Eyes - sclera anicteric Neck - supple, no JVD elevation , Chest - clear  to auscultation bilaterally, symmetrical air movement,  Heart - S1 and S2 normal, regular, Rt sided [place] Abdomen - soft, nontender, nondistended, +BS Neurological - screening mental status exam normal, neck supple without rigidity, cranial nerves II through XII intact, DTR's normal and symmetric Extremities - no pedal edema noted, intact peripheral pulses  Skin - warm, dry MSK--Thoraxic Spine tenderness without crepitus or step deformity, overlying skin without erythema  On neuro exam, I'll add MS 5/5 in BLE and light touch sensation intact.  KPS = 60  100 - Normal; no complaints; no evidence of disease. 90   - Able to carry on normal activity; minor signs or symptoms of disease. 80   - Normal activity with effort; some signs or symptoms of disease. 7   - Cares for self; unable to carry on normal activity or to do active work. 60   - Requires occasional assistance, but is able to care for most of his personal needs. 50   - Requires considerable assistance and frequent medical care. 40   - Disabled; requires special care and assistance. 30   - Severely disabled; hospital  admission is indicated although death not imminent. 20   - Very sick; hospital admission necessary; active supportive treatment necessary. 10   - Moribund; fatal processes progressing rapidly. 0     - Dead  Karnofsky DA, Abelmann WH, Craver LS and Burchenal [facility] [phone]) The use of the nitrogen mustards in the palliative treatment of carcinoma: with particular reference to bronchogenic carcinoma Cancer 1 634-56  LABORATORY DATA:  Lab Results  Component Value Date   WBC 7.3 10/07/2023   HGB 11.8 (L) 10/07/2023   HCT 36.6 (L) 10/07/2023  MCV 103.4 (H) 10/07/2023   PLT 226 10/07/2023   Lab Results  Component Value Date   NA 140 10/07/2023   K 4.3 10/07/2023   CL 106 10/07/2023   CO2 15 (L) 10/07/2023   Lab Results  Component Value Date   ALT 29 10/07/2023   AST 35 10/07/2023   ALKPHOS 121 10/07/2023   BILITOT 1.3 (H) 10/07/2023     RADIOGRAPHY: CT Angio Chest PE W and/or Wo Contrast Result Date: 10/07/2023 CLINICAL DATA:  73 year old male with shortness of breath. Diarrhea. History of renal cell carcinoma status post left nephrectomy. * Tracking Code: BO * EXAM: CT ANGIOGRAPHY CHEST WITH CONTRAST TECHNIQUE: Multidetector CT imaging of the chest was performed using the standard protocol during bolus administration of intravenous contrast. Multiplanar CT image reconstructions and MIPs were obtained to evaluate the vascular anatomy. RADIATION DOSE REDUCTION: This exam was performed according to the departmental dose-optimization program which includes automated exposure control, adjustment of the mA and/or kV according to patient size and/or use of iterative reconstruction technique. CONTRAST:  75mL OMNIPAQUE IOHEXOL 350 MG/ML SOLN COMPARISON:  Portable chest noon today. PET-CT 05/06/2023 FINDINGS: Cardiovascular: Good contrast bolus timing in the pulmonary arterial tree. No pulmonary artery filling defect identified. Calcified aortic atherosclerosis. Heart size remains normal. No pericardial  effusion. Right chest Port-A-Cath in place. Mediastinum/Nodes: Lower posterior mediastinal, prevertebral malignant appearing lymphadenopathy (17 mm short axis series 4, image 86) in association with severe spinal metastases, see details below. Other mediastinal nodal stations remain within normal limits. Lungs/Pleura: Small, trace layering pleural effusions greater on the right. Upper Abdomen: Multiple hypodense liver masses are new compared to a CT abdomen on 06/20/2022, ranging from 10 mm up to 23 mm diameter. Visible spleen, pancreas, right adrenal gland, right kidney, and bowel appear within normal limits. Musculoskeletal: Severe spinal metastatic disease which has substantially progressed since the August PET-CT at that time only small T12 vertebral and left 10th rib lesions. But large lytic, destructive, expansile osseous lesions now occupy the T7 vertebra, costovertebral junction, T8 vertebra (with mild pathologic fracture), 8th rib costochondral junction, and destroyed a the posterior right 8th rib. Bulky tumor there encompasses up to 11 cm long axis (series 4, image 60). And there is evidence of tumor invasion into the thoracic spinal canal at both levels. T8 spinal stenosis is possible (series 4, image 59). Additional left eccentric T12 destructive and expansile metastasis with mild T12 pathologic fracture and spinal canal involvement (series 4, image 95 likely with spinal cord mass effect). Destruction of the left T12 transverse process. Early extension into the left T11 vertebra there. Involvement of the left T12 costovertebral junction. Chronic enlargement and heterogeneity of the distal left clavicle is stable. Review of the MIP images confirms the above findings. IMPRESSION: 1. Severe Thoracic Spinal Metastatic Disease. Marked progression since an August PET-CT with four levels affected (T7, T8, T11, T12) pathologic vertebral fractures, destruction of the posterior right 8th rib, and tumor extension  into the spinal canal. Associated prevertebral lymph node metastases. 2. Multifocal liver metastases, new since 2023 and individually up to 2.3 cm. 3. Trace layering pleural effusions. No pulmonary embolus identified. 4.  Aortic Atherosclerosis (ICD10-I70.0). Salient findings discussed by telephone with PA CHRISTOPHER GROCE on 10/07/2023 at 13:19 . Electronically Signed   By: Odessa Fleming M.D.   On: 10/07/2023 13:23   DG Chest Port 1 View Result Date: 10/07/2023 CLINICAL DATA:  Shortness of breath. EXAM: PORTABLE CHEST 1 VIEW COMPARISON:  Chest x-ray dated September 30, 2023. FINDINGS: Unchanged right chest wall port catheter. The heart size and mediastinal contours are within normal limits. Normal pulmonary vascularity. No focal consolidation, pleural effusion, or pneumothorax. No acute osseous abnormality. IMPRESSION: 1. No active disease. Electronically Signed   By: Obie Dredge M.D.   On: 10/07/2023 12:26   DG Chest Port 1 View Result Date: 09/30/2023 CLINICAL DATA:  Shortness of breath EXAM: PORTABLE CHEST 1 VIEW COMPARISON:  01/25/2023 FINDINGS: Right Port-A-Cath remains in place, unchanged. Heart and mediastinal contours are within normal limits. No focal opacities or effusions. No acute bony abnormality. Old healed left clavicle and rib fractures. IMPRESSION: No active disease. Electronically Signed   By: Charlett Nose M.D.   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  He may benefit from urgent radiotherapy to preserve spinal cord function and reduce pain.  PLAN:Today, I talked to the patient and family about the findings and work-up thus far.  We discussed the natural history of spinal metastases and general treatment, highlighting the role of radiotherapy in the management.  We discussed the available radiation techniques, and focused on the details of logistics and delivery.  We reviewed the anticipated acute and late  sequelae associated with radiation in this setting.  The patient was encouraged to ask questions that I answered to the best of my ability.  I filled out a patient counseling form during our discussion including treatment diagrams.  We retained a copy for our records.  The patient would like to proceed with radiation and will be scheduled for CT simulation.   I personally spent 45 minutes in this encounter including chart review, reviewing radiological studies, meeting face-to-face with the patient, entering orders and completing documentation.    ------------------------------------------------   Margaretmary Dys, MD [facility]  Radiation Oncology Direct Dial: [phone]  Fax: [phone] [email or web address]  Skype  LinkedIn

## 2023-10-08 NOTE — Progress Notes (Signed)
Initial Nutrition Assessment  DOCUMENTATION CODES:   Not applicable  INTERVENTION:  - Regular diet.  - Boost Plus po BID, each supplement provides 360 kcal and 14 grams of protein - Encourage intake at all meals and of supplements. - Once complete with radiation therapy, recommend Multivitamin with minerals daily, 500mg  vitamin C and 220mg  zinc x14 days to support wound healing.  - Monitor weight trends. Ordered new weight.  NUTRITION DIAGNOSIS:   Unintentional weight loss related to cancer and cancer related treatments (renal cell cancer on chemotherapy) as evidenced by percent weight loss (20.5% in 8 months).  GOAL:   Patient will meet greater than or equal to 90% of their needs  MONITOR:   PO intake, Supplement acceptance, Weight trends, Skin  REASON FOR ASSESSMENT:   Consult Assessment of nutrition requirement/status  ASSESSMENT:   73 y.o. 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   Patient using the bathroom at time of visit. Patient's sister and brother-in-law outside room and reported being able to answer some questions about patient's nutrition history.   UBW reported to be 240# and they note he has been losing weight since his cancer diagnosis (Nov 2023).  Per EMR, patient weighed at 248# in April 2024 and dropped to 197# by December. This is a 51# or 20.5% weight loss in 8 months, which is significant for the time frame.  Suspect current weight was likely copied over from December. Will order a new weight.  Sister reports patient usually eats 3 meals a day but over the past several months his appetite has been poor and he has been eating less. Drinks Boost Plus once a day at home.   They note his current appetite seems to be a little better. They inquired if he  was able to eat and informed them he is on a regular diet so does not have any restrictions. Discussed ordering Boost Plus for patient to have during admissions and they feel the patient would enjoy that.   Of note, scans showing marked progression of metastatic disease. Palliative care now consulted.   Medications reviewed and include: Remeron, Senokot  Labs reviewed:  No BMP today   NUTRITION - FOCUSED PHYSICAL EXAM:  Unable to complete, will attempt at next visit  Diet Order:   Diet Order             Diet regular Room service appropriate? Yes; Fluid consistency: Thin  Diet effective now                   EDUCATION NEEDS:  Education needs have been addressed  Skin:  Skin Assessment: Skin Integrity Issues: Skin Integrity Issues:: Stage III Stage III: Left Buttocks - per CWOCN  Last BM:  1/22  Height:  Ht Readings from Last 1 Encounters:  10/07/23 5\' 11"  (1.803 m)   Weight:  Wt Readings from Last 1 Encounters:  10/07/23 89.8 kg    BMI:  Body mass index is 27.61 kg/m.  Estimated Nutritional Needs:  Kcal:  2000-2200 kcals Protein:  100-115 grams Fluid:  >/= 2L    Shelle Iron RD, LDN Contact via Secure Chat.

## 2023-10-08 NOTE — Therapy (Signed)
Pt no show, reviewing chart pt went to ER yesterday and transferred to [facility].  Becky Sax, LPTA/CLT; CBIS [phone] Juel Burrow, PTA 10/08/2023, 9:08 AM

## 2023-10-08 NOTE — Progress Notes (Signed)
Progress Note   Patient: David [name] QIO:[id number] DOB: 1950/11/26 DOA: 10/07/2023     1 DOS: the patient was seen and examined on 10/08/2023    Brief hospital course: David [name]  is a 73 y.o. male smoker with past medical history relevant for history of hep C previously treated with antivirals, HTN, and  Metastatic left kidney clear-cell RCC, s/p Left robotic assisted laparoscopic radical nephrectomy by Dr. Ronne Binning on 08/13/2022 with Pathology: at that time showing 7 cm clear-cell RCC with focal sarcomatoid and rhabdoid features, nuclear grade 4.  Tumor extended into the renal vein and renal sinus fat. PET scan (12/24/2022): Enlarging hypermetabolic left adrenal metastasis.  2 small hypermetabolic soft tissue nodules in the left nephrectomy bed.  Hypermetabolic lytic lesions involving distal left clavicle and left temporal bone.  Indeterminate small hypermetabolic parotid nodules bilaterally.  [facility]red [name] after acceptance by radiation oncologist for further management.    Assessment and Plan:  Metastatic left kidney clear-cell RCC, s/p Left robotic assisted laparoscopic radical nephrectomy by Dr. Ronne Binning on 08/13/2022  ---Treated with Lenvatinib 14 mg daily started on 01/18/2023, Keytruda on 01/22/2023, dose decreased to 10 mg on 01/28/2023 due to elevated blood pressure, discontinued due to progression on PET scan on 05/06/2023, - Cabozantinib 40 mg daily started on 06/24/2023 --Patient sees oncologist Dr. Ellin Saba in [facility] -CTA Chest on 10/07/2023 shows--Severe Thoracic Spinal Metastatic Disease with marked progression since an August PET-CT with four levels affected (T7, T8, T11, T12) pathologic vertebral fractures, destruction of the posterior right 8th rib, and tumor extension into the spinal canal. Associated prevertebral lymph node metastases. 2. Multifocal liver metastases, new since 2023 and individually up to 2.3 cm. 3. Trace layering  pleural effusions. No pulmonary embolus identified. --Patient with significant back/thoracic pain--Solu-Medrol as ordered, as needed opiates as ordered Radiation oncologist on board and case discussed   Acute diarrhea Follow-up on stool studies to rule out C. difficile   Lactic Acidosis with anion gap metabolic acidosis--initial lactic acid> 9.0, repeat after IVF 3.6--suspect due to dehydration in the setting of ongoing diarrhea,  -Bicarb is down to 15, anion gap is 19--in the setting of ongoing diarrhea -UA is not suggestive of UTI -WBCs 7.3,  --No fever  Or chills , no vomiting -COVID flu and RSV negative -CTA chest without evidence of acute infection No evidence of sepsis and so antibiotics not indicated at this time Continue IV fluid   Acute kidney injury--due to ongoing diarrhea and poor oral intake Monitor renal function Continue hydration Avoid nephrotoxic drugs  Tobacco abuse Continue nicotine patch  Essential hypertension  Continue to hold lisinopril due to AKI -Continue amlodipine    Chronic anemia--in the setting of underlying malignancy and chemotherapy --Hemoglobin 11.8 which is close to baseline -Continue to monitor CBC    Subjective:  Patient seen and examined at bedside this morning Denies nausea vomiting abdominal pain He is worried that he did not get her breakfast Patient has a diet order We will continue to monitor with radiation oncology input  Physical Exam: Physical Examination: General appearance - alert,  in no distress  Mental status - alert, oriented to person, place, and time,  Eyes - sclera anicteric Neck - supple, no JVD elevation , Chest - clear  to auscultation bilaterally, symmetrical air movement,  Heart - S1 and S2 normal, regular, Rt sided [place]ath Abdomen - soft, nontender, nondistended, +BS Neurological - screening mental status exam normal, neck supple without rigidity, cranial nerves II through  XII intact, DTR's normal and  symmetric Extremities - no pedal edema noted, intact peripheral pulses  Skin - warm, dry MSK--Thoraxic Spine tenderness without crepitus or step deformity, overlying skin without erythema    Vitals:   10/07/23 1743 10/07/23 2026 10/08/23 0136 10/08/23 0509  BP: (!) 157/91 (!) 153/92 (!) 148/90 139/85  Pulse: 98 92 88 87  Resp: 18 17 17 17   Temp: 97.8 F (36.6 C) 97.8 F (36.6 C) 97.6 F (36.4 C) 97.6 F (36.4 C)  TempSrc:      SpO2: 100% 100% 100% 99%  Weight:      Height:        Data Reviewed: Imaging report as noted above    Latest Ref Rng & Units 10/07/2023   10:26 AM 10/07/2023    9:38 AM 09/30/2023   10:26 AM  CBC  WBC 4.0 - 10.5 K/uL 7.3  7.5  5.3   Hemoglobin 13.0 - 17.0 g/dL 29.5  62.1  30.8   Hematocrit 39.0 - 52.0 % 36.6  39.5  36.7   Platelets 150 - 400 K/uL 226  220  175        Latest Ref Rng & Units 10/07/2023   10:26 AM 10/07/2023    9:38 AM 09/30/2023   10:26 AM  BMP  Glucose 70 - 99 mg/dL 657  846  99   BUN 8 - 23 mg/dL 25  25  25    Creatinine 0.61 - 1.24 mg/dL 9.62  9.52  8.41   Sodium 135 - 145 mmol/L 140  140  138   Potassium 3.5 - 5.1 mmol/L 4.3  4.3  4.0   Chloride 98 - 111 mmol/L 106  107  107   CO2 22 - 32 mmol/L 15  13  17    Calcium 8.9 - 10.3 mg/dL 9.5  9.5  9.0     Family Communication: No family present at bedside   Time spent: 56 minutes  Author: Loyce Dys, MD 10/08/2023 6:02 PM  For on call review [email or web address].

## 2023-10-08 NOTE — Plan of Care (Signed)
  Problem: Pain Managment: Goal: General experience of comfort will improve and/or be controlled Outcome: Progressing   Problem: Skin Integrity: Goal: Risk for impaired skin integrity will decrease Outcome: Progressing

## 2023-10-08 NOTE — Progress Notes (Signed)
  Radiation Oncology         [phone] ________________________________  Name: David [name] MRN: [id number]  Date: 10/08/2023  DOB: 05-04-1951  INPATIENT  SIMULATION AND TREATMENT PLANNING NOTE    ICD-10-CM   1. Metastatic cancer to bone [facility])  C79.51       DIAGNOSIS:  73 y.o. patient with T7-T8 and T12 spinal metastasis from left renal cancer  NARRATIVE:  The patient was brought to the CT Simulation planning suite.  Identity was confirmed.  All relevant records and images related to the planned course of therapy were reviewed.  The patient freely provided informed written consent to proceed with treatment after reviewing the details related to the planned course of therapy. The consent form was witnessed and verified by the simulation staff.  Then, the patient was set-up in a stable reproducible  supine position for radiation therapy.  CT images were obtained.  Surface markings were placed.  The CT images were loaded into the planning software.  Then the target and avoidance structures were contoured including kidneys.  Treatment planning then occurred.  The radiation prescription was entered and confirmed.  Then, I designed and supervised the construction of a total of 3 medically necessary complex treatment devices with VacLoc positioner and 2 MLCs to shield kidneys.  I have requested : 3D Simulation  I have requested a DVH of the following structures: Left Kidney, Right Kidney and target.  PLAN:  The patient will receive 30 Gy in 10 fractions to the two involved spinal sites  ________________________________  Artist Pais. Kathrynn Running, M.D.

## 2023-10-08 NOTE — TOC Initial Note (Signed)
Transition of Care [facility]) - Initial/Assessment Note    Patient Details  Name: David [name] MRN: [id number] Date of Birth: [birth year]  Transition of Care [facility]) CM/SW Contact:    Larrie Kass, LCSW Phone Number: 10/08/2023, 2:01 PM  Clinical Narrative:  CSW met with the pt's sister, Dewain Penning, to discuss her questions about the HCPOA/POA. CSW explained that the pt would need to consent to this, and the chaplain would discuss the process. The pt's sister mentioned that the pt is on a waiting list for home aide services in [facility] and inquired about any home care the pt could receive through his insurance. CSW informed the pt's sister about what is covered under the pt's insurance and also provided information about private duty care, which is private pay. The pt's sister asked about rehab possibilities, as the pt lives alone. CSW explained that physical therapy would need to recommend this. CSW also informed the pt's sister of the processes, noting that the pt would need insurance approval if rehab is recommended. The pt's sister reported that she would like to discuss things with the pt before a consult with the chaplain is scheduled.TOC to follow.        Expected Discharge Plan:  (TBD) Barriers to Discharge: Continued Medical Work up   Patient Goals and CMS Choice            Expected Discharge Plan and Services       Living arrangements for the past 2 months: Single Family Home                                      Prior Living Arrangements/Services Living arrangements for the past 2 months: Single Family Home Lives with:: Self          Need for Family Participation in Patient Care: Yes (Comment) Care giver support system in place?: Yes (comment)      Activities of Daily Living   ADL Screening (condition at time of admission) Independently performs ADLs?: Yes (appropriate for developmental age) Is the patient deaf or have difficulty  hearing?: No Does the patient have difficulty seeing, even when wearing glasses/contacts?: No Does the patient have difficulty concentrating, remembering, or making decisions?: No  Permission Sought/Granted                  Emotional Assessment Appearance:: Appears stated age            Admission diagnosis:  Shortness of breath [R06.02] Metastasis to liver [facility]) [C78.7] Metastasis to spinal cord (HCC) [C79.49] Metastatic cancer (HCC) [C79.9] Elevated lactic acid level [R79.89] Pathological fracture of thoracic vertebra, initial encounter [M84.48XA] Diarrhea, unspecified type [R19.7] Patient Active Problem List   Diagnosis Date Noted   Metastatic cancer to bone (HCC) 10/07/2023   Diarrhea in adult patient 10/07/2023   Clear cell renal cell carcinoma, left (HCC) 01/12/2023   Abdominal adhesions 08/13/2022   AKI (acute kidney injury) (HCC) 07/23/2022   Renal mass 07/23/2022   Lung nodule 07/23/2022   Urinary retention 07/23/2022   BPH (benign prostatic hyperplasia) 07/23/2022   Hepatitis C virus infection cured after antiviral drug therapy 07/13/2019   Elevated LFTs 07/13/2019   Seizure disorder (HCC) 01/14/2012   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  [address] [place] [address] Phone: [phone] Fax: [phone]  [place] [facility] [address] [place] [address] Phone: [phone] Fax: [phone]     Social Drivers of Health (SDOH) Social History: SDOH Screenings   Food Insecurity: No Food Insecurity (10/07/2023)  Housing: Low Risk  (10/07/2023)  Transportation Needs: No Transportation Needs (10/07/2023)  Utilities: Not At Risk (10/07/2023)  Depression (PHQ2-9): Low Risk  (01/12/2023)  Financial Resource Strain: Low Risk  (03/24/2023)   Received from [facility]   Recent Concern: Financial Resource Strain - High Risk (02/01/2023)  Social Connections: Unknown (03/07/2023)   Received from [facility], [facility]  Stress: Patient Declined (03/07/2023)   Received from [facility], [facility]  Tobacco Use: High Risk (09/30/2023)   SDOH Interventions:     Readmission Risk Interventions     No data to display

## 2023-10-08 NOTE — Consult Note (Signed)
Consultation Note Date: 10/08/2023   Patient Name: David [name]  DOB: [birth year]  MRN: [id number]  Age / Sex: 73 y.o., male  PCP: Assunta Found, MD Referring Physician: Loyce Dys, MD  Reason for Consultation: Establishing goals of care  HPI/Patient Profile: 73 y.o. male  admitted on 10/07/2023   Clinical Assessment and Goals of Care: 73 year old gentleman originally from [place], [place].  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  Seen and evaluated by radiation oncology after having been transferred from [facility].  Plan is for the patient to receive 30 GY in 10 fractions to 2 spinal sites T7-T8 as well as T12. Palliative consult for ongoing goals of care discussions has been requested. Chart reviewed, patient seen and examined, discussed with next of kin Sister Gelene Mink who arrived from [facility]. Patient complains of abdominal discomfort and generalized back discomfort. Palliative medicine is specialized medical care for people living with serious illness. It focuses on providing relief from the symptoms and stress of a serious illness. The goal is to improve quality of life for both the patient and the family. Goals of care: Broad aims of medical therapy in relation to the patient's values and preferences. Our aim is to provide medical care aimed at enabling patients to achieve the goals that matter most to them, given the circumstances of their particular medical situation and their constraints.  Patient met with Dr. Kathrynn Running from radiation oncology earlier this morning and is aware of the plan for initiation of radiation treatments from the afternoon on 10-08-2023. Brief life review performed, goals wishes and values attempted to be explored, patient's current cancer history also  reviewed. Patient lives by himself in [place], [place].  He does not have any local next of kin other than his sister Dewain Penning and brother-in-law.  Patient has serious illness diagnosis of metastatic left kidney clear-cell renal cell cancer.  On 08-13-2022 he underwent left robotic assisted laparoscopic radical nephrectomy.  Subsequently, he has been under the care of Dr. Ellin Saba in [place] at [facility].  He has been on immunotherapy.  He has had PET scans for monitoring.  More recently, he was found to have pathologic vertebral fractures and multifocal liver metastases. He has been transferred to [facility] for consideration for radiation treatments for metastases to bone.   NEXT OF KIN Sister Willette Brace who has arrived from [place], [place].  Patient does not have spouse, patient does not have children.  SUMMARY OF RECOMMENDATIONS    Full Code Radiation to begin today Monitor hospital course.  Consider SNF rehab with palliative on discharge Continue current pain and non pain medication regimen  Code Status/Advance Care Planning: Full code   Symptom Management:     Palliative Prophylaxis:  Frequent Pain Assessment  Additional Recommendations (Limitations, Scope, Preferences): Full Scope Treatment  Psycho-social/Spiritual:  Desire for further Chaplaincy support:yes Additional Recommendations: Caregiving  Support/Resources  Prognosis:  Unable to determine  Discharge  Planning: Skilled Nursing Facility for rehab with Palliative care service follow-up      Primary Diagnoses: Present on Admission:  Metastatic cancer to bone (HCC)  Clear cell renal cell carcinoma, left (HCC)  AKI (acute kidney injury) (HCC)  Diarrhea in adult patient   I have reviewed the medical record, interviewed the patient and family, and examined the patient. The following aspects are pertinent.  Past Medical History:  Diagnosis Date   Cancer (HCC)    Chronic  kidney disease    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 Ronal Fear note   Pneumonia    Seizure [facility])    Social History   Socioeconomic History   Marital status: Divorced    Spouse name: Not on file   Number of children: Not on file   Years of education: Not on file   Highest education level: Not on file  Occupational History   Not on file  Tobacco Use   Smoking status: Every Day    Current packs/day: 0.50    Average packs/day: 0.5 packs/day for 65.0 years (32.5 ttl pk-yrs)    Types: Cigarettes   Smokeless tobacco: Never  Vaping Use   Vaping status: Never Used  Substance and Sexual Activity   Alcohol use: No   Drug use: No   Sexual activity: Not Currently  Other Topics Concern   Not on file  Social History Narrative   Not on file   Social Drivers of Health   Financial Resource Strain: Low Risk  (03/24/2023)   Received from [facility]   Overall Financial Resource Strain (CARDIA)    Difficulty of Paying Living Expenses: Not hard at all  Recent Concern: Financial Resource Strain - High Risk (02/01/2023)   Overall Financial Resource Strain (CARDIA)    Difficulty of Paying Living Expenses: Very hard  Food Insecurity: No Food Insecurity (10/07/2023)   Hunger Vital Sign    Worried About Running Out of Food in the Last Year: Never true    Ran Out of Food in the Last Year: Never true  Transportation Needs: No Transportation Needs (10/07/2023)   PRAPARE - Administrator, Civil Service (Medical): No    Lack of Transportation (Non-Medical): No  Physical Activity: Not on file  Stress: Patient Declined (03/07/2023)   Received from [facility] - Occupational Stress Questionnaire    Feeling of Stress : Patient declined  Social Connections: Unknown (03/07/2023)   Received from [facility]   Social Network    Social  Network: Not on file   No family history on file. Scheduled Meds:  amLODipine  10 mg Oral Daily   Chlorhexidine Gluconate Cloth  6 each Topical Daily   heparin  5,000 Units Subcutaneous Q8H   lactose free nutrition  237 mL Oral BID BM   methylPREDNISolone (SOLU-MEDROL) injection  40 mg Intravenous Q12H   mirtazapine  7.5 mg Oral QHS   senna-docusate  2 tablet Oral QHS   sodium chloride flush  10-40 mL Intracatheter Q12H   sodium chloride flush  3 mL Intravenous Q12H   sodium chloride flush  3 mL Intravenous Q12H   tamsulosin  0.4 mg Oral Daily   Continuous Infusions:  sodium chloride     sodium chloride     PRN Meds:.sodium chloride, acetaminophen **OR** acetaminophen, bisacodyl, HYDROmorphone (DILAUDID) injection, labetalol, ondansetron **  OR** ondansetron (ZOFRAN) IV, oxyCODONE, sodium chloride flush, sodium chloride flush Medications Prior to Admission:  Prior to Admission medications   Medication Sig Start Date End Date Taking? Authorizing Provider  amLODipine (NORVASC) 10 MG tablet Take 1 tablet (10 mg total) by mouth daily. 07/12/23  Yes Doreatha Massed, MD  cabozantinib (CABOMETYX) 40 MG tablet Take 1 tablet (40 mg total) by mouth daily. Take on an empty stomach, 1 hour before or 2 hours after meals. 08/11/23  Yes Doreatha Massed, MD  FLUZONE HIGH-DOSE 0.5 ML injection  06/30/23  Yes [provider]  [facility] syringe  06/30/23  Yes [provider]  tamsulosin (FLOMAX) 0.4 MG CAPS capsule Take 0.4 mg by mouth daily.    Yes [provider]  Clobetasol Prop Emollient Base (CLOBETASOL PROPIONATE E) 0.05 % emollient cream Apply 1 Application topically 2 (two) times daily. 05/04/23   Doreatha Massed, MD  levETIRAcetam (KEPPRA) 500 MG tablet Take by mouth. Patient not taking: Reported on 10/07/2023 03/12/23   [provider]  lidocaine (XYLOCAINE) 5 % ointment Apply 1 Application topically 2 (two) times daily as needed for moderate pain.  05/04/23   Doreatha Massed, MD  lidocaine-prilocaine (EMLA) cream Apply to affected area once Patient not taking: Reported on 10/07/2023 01/22/23   Doreatha Massed, MD  lisinopril (ZESTRIL) 10 MG tablet Take by mouth. 03/13/23 09/06/23  [provider]  megestrol (MEGACE) 400 MG/10ML suspension Take 10 mLs (400 mg total) by mouth 2 (two) times daily. Patient not taking: Reported on 10/07/2023 09/06/23   Doreatha Massed, MD  prochlorperazine (COMPAZINE) 10 MG tablet Take 1 tablet (10 mg total) by mouth every 6 (six) hours as needed for nausea or vomiting. Patient not taking: Reported on 10/07/2023 01/22/23   Doreatha Massed, MD  triamcinolone cream (KENALOG) 0.1 % SMARTSIG:1 Application Topical 2-3 Times Daily Patient not taking: Reported on 10/07/2023 05/11/23   [provider]   No Known Allergies Review of Systems Patient complains of abdominal pain and general back pain Physical Exam Elderly appearing gentleman resting in bed Mild distress secondary to pain Regular work of breathing Does not have peripheral line Has evidence of cancer cachexia and muscle wasting Awake alert oriented  Vital Signs: BP 139/85 (BP Location: Left Arm)   Pulse 87   Temp 97.6 F (36.4 C)   Resp 17   Ht 5\' 11"  (1.803 m)   Wt 89.8 kg   SpO2 99%   BMI 27.61 kg/m  Pain Scale: 0-10   Pain Score: 3    SpO2: SpO2: 99 % O2 Device:SpO2: 99 % O2 Flow Rate: .O2 Flow Rate (L/min): 2 L/min  IO: Intake/output summary:  Intake/Output Summary (Last 24 hours) at 10/08/2023 1216 Last data filed at 10/07/2023 2100 Gross per 24 hour  Intake 120 ml  Output 700 ml  Net -580 ml    LBM: Last BM Date : 10/06/23 Baseline Weight: Weight: 89.8 kg Most recent weight: Weight: 89.8 kg     Palliative Assessment/Data:   Palliative performance scale 50%  Time In:  12 Time Out:  1300 Time Total:  60  Greater than 50%  of this time was spent counseling and coordinating care related to  the above assessment and plan.  Signed by: Rosalin Hawking, MD   Please contact Palliative Medicine Team phone at [phone] for questions and concerns.  For individual provider: See Loretha Stapler

## 2023-10-09 ENCOUNTER — Encounter (HOSPITAL_COMMUNITY): Payer: Self-pay | Admitting: Family Medicine

## 2023-10-09 ENCOUNTER — Other Ambulatory Visit: Payer: Self-pay

## 2023-10-09 DIAGNOSIS — C7951 Secondary malignant neoplasm of bone: Principal | ICD-10-CM

## 2023-10-09 LAB — CBC
HCT: 27.3 % — ABNORMAL LOW (ref 39.0–52.0)
Hemoglobin: 8.9 g/dL — ABNORMAL LOW (ref 13.0–17.0)
MCH: 33.1 pg (ref 26.0–34.0)
MCHC: 32.6 g/dL (ref 30.0–36.0)
MCV: 101.5 fL — ABNORMAL HIGH (ref 80.0–100.0)
Platelets: UNDETERMINED 10*3/uL (ref 150–400)
RBC: 2.69 MIL/uL — ABNORMAL LOW (ref 4.22–5.81)
RDW: 27.6 % — ABNORMAL HIGH (ref 11.5–15.5)
WBC: 4.6 10*3/uL (ref 4.0–10.5)
nRBC: 1.1 % — ABNORMAL HIGH (ref 0.0–0.2)

## 2023-10-09 LAB — BASIC METABOLIC PANEL
Anion gap: 10 (ref 5–15)
BUN: 23 mg/dL (ref 8–23)
CO2: 22 mmol/L (ref 22–32)
Calcium: 8.2 mg/dL — ABNORMAL LOW (ref 8.9–10.3)
Chloride: 108 mmol/L (ref 98–111)
Creatinine, Ser: 0.8 mg/dL (ref 0.61–1.24)
GFR, Estimated: >60 mL/min (ref >60)
Glucose, Bld: 106 mg/dL — ABNORMAL HIGH (ref 70–99)
Potassium: 4.1 mmol/L (ref 3.5–5.1)
Sodium: 140 mmol/L (ref 135–145)

## 2023-10-09 LAB — GLUCOSE, CAPILLARY
Glucose-Capillary: 90 mg/dL (ref 70–99)
Glucose-Capillary: 93 mg/dL (ref 70–99)
Glucose-Capillary: 83 mg/dL (ref 70–99)
Glucose-Capillary: 134 mg/dL — ABNORMAL HIGH (ref 70–99)

## 2023-10-09 MED ORDER — SODIUM CHLORIDE 0.9 % IV SOLN
INTRAVENOUS | Status: DC
  Administered 2023-10-09: 100 mL/h via INTRAVENOUS

## 2023-10-09 NOTE — Plan of Care (Signed)

## 2023-10-09 NOTE — Progress Notes (Signed)
Daily Progress Note   Patient Name: David [name]       Date: 10/09/2023 DOB: 06-07-1951  Age: 73 y.o. MRN#: [id number] Attending Physician: Lanae Boast, MD Primary Care Physician: Assunta Found, MD Admit Date: 10/07/2023  Reason for Consultation/Follow-up: Establishing goals of care  Subjective:  Awake alert, no distress, he talks about his experience with radiation on 10-08-23. Admits to some generalized abdominal and back pain, but states that he is someone who doesn't want too much opioids.  Patient states he rested well, appetite is picking up, he is looking forward to the football games tomorrow.   Length of Stay: 2  Current Medications: Scheduled Meds:   amLODipine  10 mg Oral Daily   Chlorhexidine Gluconate Cloth  6 each Topical Daily   heparin  5,000 Units Subcutaneous Q8H   lactose free nutrition  237 mL Oral BID BM   methylPREDNISolone (SOLU-MEDROL) injection  40 mg Intravenous Q12H   mirtazapine  7.5 mg Oral QHS   senna-docusate  2 tablet Oral QHS   sodium chloride flush  10-40 mL Intracatheter Q12H   sodium chloride flush  3 mL Intravenous Q12H   sodium chloride flush  3 mL Intravenous Q12H   tamsulosin  0.4 mg Oral Daily    Continuous Infusions:  sodium chloride 100 mL/hr at 10/09/23 0600   sodium chloride      PRN Meds: acetaminophen **OR** acetaminophen, bisacodyl, dextromethorphan-guaiFENesin, HYDROmorphone (DILAUDID) injection, labetalol, ondansetron **OR** ondansetron (ZOFRAN) IV, oxyCODONE, sodium chloride flush, sodium chloride flush  Physical Exam         Awake alert Appears chronically ill No distress Mild generalized abdominal tenderness No edema Oriented  Vital Signs: BP (!) 148/82 (BP Location: Left Arm)   Pulse 87   Temp 97.6 F (36.4 C)    Resp 14   Ht 5\' 11"  (1.803 m)   Wt 89.8 kg   SpO2 99%   BMI 27.61 kg/m  SpO2: SpO2: 99 % O2 Device: O2 Device: Room Air O2 Flow Rate: O2 Flow Rate (L/min): 2 L/min  Intake/output summary:  Intake/Output Summary (Last 24 hours) at 10/09/2023 1216 Last data filed at 10/09/2023 0900 Gross per 24 hour  Intake 537.93 ml  Output 1150 ml  Net -612.07 ml   LBM: Last BM Date : 10/08/23 Baseline Weight: Weight: 89.8 kg  Most recent weight: Weight: 89.8 kg       Palliative Assessment/Data:      Patient Active Problem List   Diagnosis Date Noted   Metastatic cancer to bone (HCC) 10/07/2023   Diarrhea in adult patient 10/07/2023   Clear cell renal cell carcinoma, left (HCC) 01/12/2023   Abdominal adhesions 08/13/2022   AKI (acute kidney injury) (HCC) 07/23/2022   Renal mass 07/23/2022   Lung nodule 07/23/2022   Urinary retention 07/23/2022   BPH (benign prostatic hyperplasia) 07/23/2022   Hepatitis C virus infection cured after antiviral drug therapy 07/13/2019   Elevated LFTs 07/13/2019   Seizure disorder (HCC) 01/14/2012   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  Seen and evaluated by radiation oncology after having been transferred from [facility].  Plan is for the patient to receive 30 GY in 10 fractions to 2 spinal sites T7-T8 as well as T12. Palliative consult for ongoing goals of care discussions has been requested  Recommendations/Plan:   Continue current pain and non pain symptom management regimen and monitor.   Code Status:    Code Status Orders  (From admission, onward)           Start     Ordered   10/07/23 1559  Full code  Continuous       Question:  By:  Answer:  Consent: discussion documented in  EHR   10/07/23 1559           Code Status History     Date Active Date Inactive Code Status Order ID Comments User Context   08/13/2022 1509 08/16/2022 2240 Full Code [id number]  Malen Gauze, MD Inpatient   07/23/2022 2055 07/24/2022 1729 Full Code [id number]  Elgergawy, Leana Roe, MD Inpatient       Prognosis:  Unable to determine  Discharge Planning: Skilled Nursing Facility for rehab with Palliative care service follow-up  Care plan was discussed with  patient.   Thank you for allowing the Palliative Medicine Team to assist in the care of this patient.  Mod MDM     Greater than 50%  of this time was spent counseling and coordinating care related to the above assessment and plan.  Rosalin Hawking, MD  Please contact Palliative Medicine Team phone at [phone] for questions and concerns.

## 2023-10-09 NOTE — Progress Notes (Signed)
PROGRESS NOTE David [name]  ZOX:[id number] DOB: [birth year] DOA: 10/07/2023 PCP: Assunta Found, MD  Brief Narrative/Hospital Course:  73 y.o. male smoker with past medical history relevant for history of hep C previously treated with antivirals, HTN, and  Metastatic left kidney clear-cell RCC, s/p Left robotic assisted laparoscopic radical nephrectomy by Dr. Ronne Binning on 08/13/2022 with Pathology: at that time showing 7 cm clear-cell RCC with focal sarcomatoid and rhabdoid features, nuclear grade 4.  Tumor extended into the renal vein and renal sinus fat. PET scan (12/24/2022): Enlarging hypermetabolic left adrenal metastasis.  2 small hypermetabolic soft tissue nodules in the left nephrectomy bed.  Hypermetabolic lytic lesions involving distal left clavicle and left temporal bone.  Indeterminate small hypermetabolic parotid nodules bilaterally. 1/23>Transferred from [name] for radiation oncology treatment Palliative care following.      Subjective: Patient seen and examined Complains of ongoing loose stool which is not new-repeat ongoing since new chemo Overnight BP stable afebrile Labs reviewed stable bicarb electrolytes, CBC with chronic anemia hemoglobin 8.9 g Remains on Solu-Medrol 40 mg every 12   Assessment and Plan: Principal Problem:   Metastatic cancer to bone [facility]) Active Problems:   Clear cell renal cell carcinoma, left (HCC)   Diarrhea in adult patient   Seizure disorder (HCC)   AKI (acute kidney injury) (HCC)   Metastatic left kidney RCC Status post left robotic assisted laparoscopic radical nephrectomy 07/27/2022: Patient is followed by oncology at any pain and has received different regimen including lenvatinib Keytruda car bozantinib-see previous note  CTA Chest on 1/23/202>>severe Thoracic Spinal Metastatic Disease with marked progression since an August PET-CT with four levels affected (T7, T8, T11, T12) pathologic vertebral fractures, destruction of the posterior  right 8th rib, and tumor extension into the spinal canal. Associated prevertebral lymph node metastases.Multifocal liver metastases, new since 2023 and individually up to 2.3 cm.Trace layering pleural effusions. No pulmonary embolus identified. [facility] for XRT s/p 1st XRT 1/24 continue Solu-Medrol, pain management bowel regimen Palliative care is following plan for SNF, currently full code  Dehydration/lactic acidosis: Initially anion gap high with lactic acid more than 90 received aggressive IV fluid hydration and recheck lactic acid better.  Bicarb stable this morning.  No leukocytosis no evidence of UTI no fever chills CTA chest without evidence of acute infection and no evidence of sepsis at this time.  Continue IV fluids  Acute diarrhea: History of COVID-negative C. difficile antigen and toxin.  Continue symptomatic management  AKI: Due to diarrhea, resolved,Continue IV fluids  Hypertension stable BP on amlodipine 10 mg. Continue Flomax Continue Remeron at bedtime  Unintentional weight loss Nutrition Problem: Unintentional weight loss Etiology: cancer and cancer related treatments (renal cell cancer on chemotherapy) Signs/Symptoms: percent weight loss (20.5% in 8 months) Percent weight loss: 20.5 % (in 8 months) Interventions: Boost Plus, Refer to RD note for recommendations    DVT prophylaxis: heparin injection 5,000 Units Start: 10/07/23 2200 SCDs Start: 10/07/23 1559 Place TED hose Start: 10/07/23 1559 Code Status:   Code Status: Full Code Family Communication: plan of care discussed with patient at bedside. Patient status is: Remains hospitalized because of severity of illness Level of care: Med-Surg   Dispo: The patient is from: home            Anticipated disposition: SNF  Objective: Vitals last 24 hrs: Vitals:   10/08/23 0509 10/08/23 1939 10/09/23 0557 10/09/23 1239  BP: 139/85 125/79 (!) 148/82 (!) 148/86  Pulse: 87 87 87 95  Resp: 17 17 14     Temp: 97.6 F (36.4 C) 97.9 F (36.6 C) 97.6 F (36.4 C) (!) 97.4 F (36.3 C)  TempSrc:    Oral  SpO2: 99% 100% 99% 99%  Weight:      Height:       Weight change:   Physical Examination: General exam: alert awake,at baseline, older than stated age HEENT:Oral mucosa moist, Ear/Nose WNL grossly Respiratory system: Bilaterally clear BS,no use of accessory muscle Cardiovascular system: S1 & S2 +, No JVD. Gastrointestinal system: Abdomen soft,NT,ND, BS+ Nervous System: Alert, awake, moving all extremities,and following commands. Extremities: LE edema neg,distal peripheral pulses palpable and warm.  Skin: No rashes,no icterus. MSK: Normal muscle bulk,tone, power   Medications reviewed:  Scheduled Meds:  amLODipine  10 mg Oral Daily   Chlorhexidine Gluconate Cloth  6 each Topical Daily   heparin  5,000 Units Subcutaneous Q8H   lactose free nutrition  237 mL Oral BID BM   methylPREDNISolone (SOLU-MEDROL) injection  40 mg Intravenous Q12H   mirtazapine  7.5 mg Oral QHS   senna-docusate  2 tablet Oral QHS   sodium chloride flush  10-40 mL Intracatheter Q12H   sodium chloride flush  3 mL Intravenous Q12H   sodium chloride flush  3 mL Intravenous Q12H   tamsulosin  0.4 mg Oral Daily   Continuous Infusions:  sodium chloride 100 mL/hr at 10/09/23 1406   sodium chloride        Diet Order             Diet regular Room service appropriate? Yes; Fluid consistency: Thin  Diet effective now                   Intake/Output Summary (Last 24 hours) at 10/09/2023 1446 Last data filed at 10/09/2023 0900 Gross per 24 hour  Intake 537.93 ml  Output 1150 ml  Net -612.07 ml   Net IO Since Admission: -1,192.07 mL [10/09/23 1446]  Wt Readings from Last 3 Encounters:  10/07/23 89.8 kg  09/30/23 89.8 kg  09/06/23 89.8 kg     Unresulted Labs (From admission, onward)     Start     Ordered   10/09/23 0500  CBC  Daily,   R     Question:  Specimen collection method  Answer:  IV  Team=IV Team collect   10/08/23 1818   10/09/23 0500  Basic metabolic panel  Daily,   R     Question:  Specimen collection method  Answer:  IV Team=IV Team collect   10/08/23 1818           Data Reviewed: I have personally reviewed following labs and imaging studies CBC: Recent Labs  Lab 10/07/23 0938 10/07/23 1026 10/09/23 0453  WBC 7.5 7.3 4.6  NEUTROABS 3.0 2.9  --   HGB 11.9* 11.8* 8.9*  HCT 39.5 36.6* 27.3*  MCV 107.0* 103.4* 101.5*  PLT 220 226 PLATELET CLUMPS NOTED ON SMEAR, UNABLE TO ESTIMATE   Basic Metabolic Panel:  Recent Labs  Lab 10/07/23 0938 10/07/23 1026 10/09/23 0453  NA 140 140 140  K 4.3 4.3 4.1  CL 107 106 108  CO2 13* 15* 22  GLUCOSE 144* 144* 106*  BUN 25* 25* 23  CREATININE 1.34* 1.40* 0.80  CALCIUM 9.5 9.5 8.2*  MG 2.2  --   --    GFR: Estimated Creatinine Clearance: 90.2 mL/min (by C-G formula based on SCr of 0.8 mg/dL). Liver Function Tests:  Recent Labs  Lab 10/07/23 0938 10/07/23 1026  AST 35 35  ALT 30 29  ALKPHOS 119 121  BILITOT 1.1 1.3*  PROT 7.8 7.8  ALBUMIN 4.0 3.9  Sepsis Labs: Recent Labs  Lab 10/07/23 1026 10/07/23 1211 10/07/23 1819  LATICACIDVEN >9.0* 3.6* 2.2*  2.2*   Recent Results (from the past 240 hours)  Culture, blood (Routine x 2)     Status: None (Preliminary result)   Collection Time: 10/07/23 10:26 AM   Specimen: BLOOD  Result Value Ref Range Status   Specimen Description BLOOD LEFT ASSIST CONTROL  Final   Special Requests   Final    BOTTLES DRAWN AEROBIC AND ANAEROBIC Blood Culture results may not be optimal due to an inadequate volume of blood received in culture bottles   Culture   Final    NO GROWTH 2 DAYS Performed at [facility], [address], [place], [place] [address]    Report Status PENDING  Incomplete  Culture, blood (Routine x 2)     Status: None (Preliminary result)   Collection Time: 10/07/23 10:31 AM   Specimen: BLOOD  Result Value Ref Range Status   Specimen Description  BLOOD LEFT ARM  Final   Special Requests   Final    BOTTLES DRAWN AEROBIC ONLY Blood Culture results may not be optimal due to an inadequate volume of blood received in culture bottles   Culture   Final    NO GROWTH 2 DAYS Performed at [facility], [address] [place], [place] [address]    Report Status PENDING  Incomplete  Resp panel by RT-PCR (RSV, Flu A&B, Covid) Anterior Nasal Swab     Status: None   Collection Time: 10/07/23 12:06 PM   Specimen: Anterior Nasal Swab  Result Value Ref Range Status   SARS Coronavirus 2 by RT PCR NEGATIVE NEGATIVE Final    Comment: (NOTE) SARS-CoV-2 target nucleic acids are NOT DETECTED.  The SARS-CoV-2 RNA is generally detectable in upper respiratory specimens during the acute phase of infection. The lowest concentration of SARS-CoV-2 viral copies this assay can detect is 138 copies/mL. A negative result does not preclude SARS-Cov-2 infection and should not be used as the sole basis for treatment or other patient management decisions. A negative result may occur with  improper specimen collection/handling, submission of specimen other than nasopharyngeal swab, presence of viral mutation(s) within the areas targeted by this assay, and inadequate number of viral copies(<138 copies/mL). A negative result must be combined with clinical observations, patient history, and epidemiological information. The expected result is Negative.  Fact Sheet for Patients:  BloggerCourse.com  Fact Sheet for Healthcare Providers:  SeriousBroker.it  This test is no t yet approved or cleared by the [place] FDA and  has been authorized for detection and/or diagnosis of SARS-CoV-2 by FDA under an Emergency Use Authorization (EUA). This EUA will remain  in effect (meaning this test can be used) for the duration of the COVID-19 declaration under Section 564(b)(1) of the Act, 21 U.S.C.section 360bbb-3(b)(1),  unless the authorization is terminated  or revoked sooner.       Influenza A by PCR NEGATIVE NEGATIVE Final   Influenza B by PCR NEGATIVE NEGATIVE Final    Comment: (NOTE) The Xpert Xpress SARS-CoV-2/FLU/RSV plus assay is intended as an aid in the diagnosis of influenza from Nasopharyngeal swab specimens and should not be used as a sole basis for treatment. Nasal washings and aspirates are unacceptable for Xpert Xpress SARS-CoV-2/FLU/RSV testing.  Fact Sheet for Patients:  BloggerCourse.com  Fact Sheet for Healthcare Providers: SeriousBroker.it  This test is not yet approved or cleared by the [place] FDA and has been authorized for detection and/or diagnosis of SARS-CoV-2 by FDA under an Emergency Use Authorization (EUA). This EUA will remain in effect (meaning this test can be used) for the duration of the COVID-19 declaration under Section 564(b)(1) of the Act, 21 U.S.C. section 360bbb-3(b)(1), unless the authorization is terminated or revoked.     Resp Syncytial Virus by PCR NEGATIVE NEGATIVE Final    Comment: (NOTE) Fact Sheet for Patients: BloggerCourse.com  Fact Sheet for Healthcare Providers: SeriousBroker.it  This test is not yet approved or cleared by the [place] FDA and has been authorized for detection and/or diagnosis of SARS-CoV-2 by FDA under an Emergency Use Authorization (EUA). This EUA will remain in effect (meaning this test can be used) for the duration of the COVID-19 declaration under Section 564(b)(1) of the Act, 21 U.S.C. section 360bbb-3(b)(1), unless the authorization is terminated or revoked.  Performed at [facility], [address], [place], [place] [address]   C Difficile Quick Screen w PCR reflex     Status: None   Collection Time: 10/08/23  1:55 PM   Specimen: STOOL  Result Value Ref Range Status   C Diff antigen NEGATIVE  NEGATIVE Final   C Diff toxin NEGATIVE NEGATIVE Final   C Diff interpretation No C. difficile detected.  Final    Comment: NEGATIVE Performed at [facility], [address] [place], [place] [address]     Antimicrobials/Microbiology: Anti-infectives (From admission, onward)    None         Component Value Date/Time   SDES BLOOD LEFT ARM 10/07/2023 1031   SPECREQUEST  10/07/2023 1031    BOTTLES DRAWN AEROBIC ONLY Blood Culture results may not be optimal due to an inadequate volume of blood received in culture bottles   CULT  10/07/2023 1031    NO GROWTH 2 DAYS Performed at [facility], [address], [place], [place] [address]    REPTSTATUS PENDING 10/07/2023 1031     Radiology Studies: No results found.   LOS: 2 days   Total time spent in review of labs and imaging, patient evaluation, formulation of plan, documentation and communication with family: 35 minutes  Lanae Boast, MD  [facility]  10/09/2023, 2:46 PM

## 2023-10-09 NOTE — Hospital Course (Addendum)
73 y.o. male smoker with past medical history relevant for history of hep C previously treated with antivirals, HTN, and  Metastatic left kidney clear-cell RCC, s/p Left robotic assisted laparoscopic radical nephrectomy by Dr. Ronne Binning on 08/13/2022 with Pathology: at that time showing 7 cm clear-cell RCC with focal sarcomatoid and rhabdoid features, nuclear grade 4.  Tumor extended into the renal vein and renal sinus fat. PET scan (12/24/2022): Enlarging hypermetabolic left adrenal metastasis.  2 small hypermetabolic soft tissue nodules in the left nephrectomy bed.  Hypermetabolic lytic lesions involving distal left clavicle and left temporal bone.  Indeterminate small hypermetabolic parotid nodules bilaterally. 1/23>Transferred from [name] for radiation oncology treatment Palliative care following.

## 2023-10-09 NOTE — Plan of Care (Signed)

## 2023-10-10 DIAGNOSIS — C7951 Secondary malignant neoplasm of bone: Secondary | ICD-10-CM | POA: Diagnosis not present

## 2023-10-10 LAB — BASIC METABOLIC PANEL
Anion gap: 8 (ref 5–15)
BUN: 23 mg/dL (ref 8–23)
CO2: 20 mmol/L — ABNORMAL LOW (ref 22–32)
Calcium: 7.5 mg/dL — ABNORMAL LOW (ref 8.9–10.3)
Chloride: 110 mmol/L (ref 98–111)
Creatinine, Ser: 0.67 mg/dL (ref 0.61–1.24)
GFR, Estimated: >60 mL/min (ref >60)
Glucose, Bld: 135 mg/dL — ABNORMAL HIGH (ref 70–99)
Potassium: 4 mmol/L (ref 3.5–5.1)
Sodium: 138 mmol/L (ref 135–145)

## 2023-10-10 LAB — CBC
HCT: 25.1 % — ABNORMAL LOW (ref 39.0–52.0)
Hemoglobin: 8.2 g/dL — ABNORMAL LOW (ref 13.0–17.0)
MCH: 33.6 pg (ref 26.0–34.0)
MCHC: 32.7 g/dL (ref 30.0–36.0)
MCV: 102.9 fL — ABNORMAL HIGH (ref 80.0–100.0)
Platelets: ADEQUATE 10*3/uL (ref 150–400)
RBC: 2.44 MIL/uL — ABNORMAL LOW (ref 4.22–5.81)
RDW: 27.9 % — ABNORMAL HIGH (ref 11.5–15.5)
WBC: 3.6 10*3/uL — ABNORMAL LOW (ref 4.0–10.5)
nRBC: 1.9 % — ABNORMAL HIGH (ref 0.0–0.2)

## 2023-10-10 LAB — GLUCOSE, CAPILLARY
Glucose-Capillary: 116 mg/dL — ABNORMAL HIGH (ref 70–99)
Glucose-Capillary: 245 mg/dL — ABNORMAL HIGH (ref 70–99)

## 2023-10-10 MED ORDER — SODIUM CHLORIDE 0.9 % IV SOLN: INTRAVENOUS | Status: AC

## 2023-10-10 NOTE — Plan of Care (Signed)

## 2023-10-10 NOTE — Progress Notes (Signed)
Daily Progress Note   Patient Name: David [name]       Date: 10/10/2023 DOB: April 14, 1951  Age: 73 y.o. MRN#: [id number] Attending Physician: Lanae Boast, MD Primary Care Physician: Assunta Found, MD Admit Date: 10/07/2023  Reason for Consultation/Follow-up: Establishing goals of care  Subjective:  Awake alert, no distress, friend at bedside.   Length of Stay: 3  Current Medications: Scheduled Meds:   amLODipine  10 mg Oral Daily   Chlorhexidine Gluconate Cloth  6 each Topical Daily   heparin  5,000 Units Subcutaneous Q8H   lactose free nutrition  237 mL Oral BID BM   methylPREDNISolone (SOLU-MEDROL) injection  40 mg Intravenous Q12H   mirtazapine  7.5 mg Oral QHS   senna-docusate  2 tablet Oral QHS   sodium chloride flush  10-40 mL Intracatheter Q12H   sodium chloride flush  3 mL Intravenous Q12H   sodium chloride flush  3 mL Intravenous Q12H   tamsulosin  0.4 mg Oral Daily    Continuous Infusions:  sodium chloride 50 mL/hr at 10/10/23 1056   sodium chloride      PRN Meds: acetaminophen **OR** acetaminophen, bisacodyl, dextromethorphan-guaiFENesin, HYDROmorphone (DILAUDID) injection, labetalol, ondansetron **OR** ondansetron (ZOFRAN) IV, oxyCODONE, sodium chloride flush, sodium chloride flush  Physical Exam         Awake alert Appears chronically ill No distress Mild generalized abdominal tenderness No edema Oriented  Vital Signs: BP (!) 144/78 (BP Location: Right Arm)   Pulse 86   Temp (!) 97.5 F (36.4 C) (Oral)   Resp 16   Ht 5\' 11"  (1.803 m)   Wt 89.8 kg   SpO2 95%   BMI 27.61 kg/m  SpO2: SpO2: 95 % O2 Device: O2 Device: Room Air O2 Flow Rate: O2 Flow Rate (L/min): 2 L/min  Intake/output summary:  Intake/Output Summary (Last 24 hours) at 10/10/2023  1134 Last data filed at 10/10/2023 0600 Gross per 24 hour  Intake 337 ml  Output 1150 ml  Net -813 ml   LBM: Last BM Date : 10/09/23 Baseline Weight: Weight: 89.8 kg Most recent weight: Weight: 89.8 kg       Palliative Assessment/Data:      Patient Active Problem List   Diagnosis Date Noted   Metastatic cancer to bone (HCC) 10/07/2023   Diarrhea in adult patient 10/07/2023  Clear cell renal cell carcinoma, left (HCC) 01/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  Seen and evaluated by radiation oncology after having been transferred from [facility].  Plan is for the patient to receive 30 GY in 10 fractions to 2 spinal sites T7-T8 as well as T12. Palliative consult for ongoing goals of care discussions has been requested  Recommendations/Plan:   Continue current pain and non pain symptom management regimen and monitor.   Code Status:    Code Status Orders  (From admission, onward)           Start     Ordered   10/07/23 1559  Full code  Continuous       Question:  By:  Answer:  Consent: discussion documented in EHR   10/07/23 1559           Code Status History     Date Active Date Inactive Code Status Order ID Comments User Context   08/13/2022 1509 08/16/2022 2240 Full Code [id number]  Malen Gauze, MD Inpatient   07/23/2022 2055 07/24/2022 1729  Full Code [id number]  Elgergawy, Leana Roe, MD Inpatient       Prognosis:  Unable to determine  Discharge Planning: Skilled Nursing Facility for rehab with Palliative care service follow-up  Care plan was discussed with  patient.   Thank you for allowing the Palliative Medicine Team to assist in the care of this patient.  low MDM     Greater than 50%  of this time was spent counseling and coordinating care related to the above assessment and plan.  Rosalin Hawking, MD  Please contact Palliative Medicine Team phone at [phone] for questions and concerns.

## 2023-10-10 NOTE — Progress Notes (Signed)
PROGRESS NOTE COUNCIL MUNGUIA  EAV:[id number] DOB: May 10, 1951 DOA: 10/07/2023 PCP: Assunta Found, MD  Brief Narrative/Hospital Course:  73 y.o. male smoker with past medical history relevant for history of hep C previously treated with antivirals, HTN, and  Metastatic left kidney clear-cell RCC, s/p Left robotic assisted laparoscopic radical nephrectomy by Dr. Ronne Binning on 08/13/2022 with Pathology: at that time showing 7 cm clear-cell RCC with focal sarcomatoid and rhabdoid features, nuclear grade 4.  Tumor extended into the renal vein and renal sinus fat. PET scan (12/24/2022): Enlarging hypermetabolic left adrenal metastasis.  2 small hypermetabolic soft tissue nodules in the left nephrectomy bed.  Hypermetabolic lytic lesions involving distal left clavicle and left temporal bone.  Indeterminate small hypermetabolic parotid nodules bilaterally. 1/23>Transferred from [name] for radiation oncology treatment Palliative care following.   Subjective: Reports he feeling better today and diarrhea has slowed down Pain is controlled Overnight afebrile BP stable labs shows chronic microcytic anemia stable renal function and electrolytes Fortunately had BM x 1 while he   Assessment and Plan: Principal Problem:   Metastatic cancer to bone [facility]) Active Problems:   Clear cell renal cell carcinoma, left (HCC)   Diarrhea in adult patient   Seizure disorder (HCC)   AKI (acute kidney injury) (HCC)   Metastatic left kidney RCC Status post left robotic assisted laparoscopic radical nephrectomy 07/27/2022: Patient is followed by oncology at any pain and has received different regimen including lenvatinib Keytruda car bozantinib-see previous note  CTA Chest 1/23>severe Thoracic Spinal Metastatic Disease with marked progression since an August PET-CT with four levels affected (T7, T8, T11, T12) pathologic vertebral fractures, destruction of the posterior right 8th rib, and tumor extension into the spinal  canal. Associated prevertebral lymph node metastases.Multifocal liver metastases, new since 2023 and individually up to 2.3 cm.Trace layering pleural effusions. No pulmonary embolus identified. Transferred from [facility] for XRT s/p 1st XRT 1/24 continue Solu-Medrol, pain management Palliative care is following plan for SNF, currently full code  Dehydration/lactic acidosis: Initially anion gap high with lactic acid more than 90 received aggressive IV fluid hydration and recheck lactic acid better.  Bicarb stable this morning.  No leukocytosis no evidence of UTI no fever chills CTA chest without evidence of acute infection and no evidence of sepsis at this time.  Continue IV fluids  Acute diarrhea: History of COVID-negative C. difficile antigen and toxin.  Overall stable continue symptomatic management  AKI: Due to diarrhea, resolved.  Discontinue IV fluids after current bag expires encourage oral intake  Hypertension: BP stabl on amlodipine 10 mg.  Continue Flomax Continue Remeron at bedtime  Unintentional weight loss Nutrition Problem: Unintentional weight loss Etiology: cancer and cancer related treatments (renal cell cancer on chemotherapy) Signs/Symptoms: percent weight loss (20.5% in 8 months) Percent weight loss: 20.5 % (in 8 months) Interventions: Boost Plus, Refer to RD note for recommendations    DVT prophylaxis: heparin injection 5,000 Units Start: 10/07/23 2200 SCDs Start: 10/07/23 1559 Place TED hose Start: 10/07/23 1559 Code Status:   Code Status: Full Code Family Communication: plan of care discussed with patient at bedside. Patient status is: Remains hospitalized because of severity of illness Level of care: Med-Surg   Dispo: The patient is from: home            Anticipated disposition: SNF once XRT done 1-2 days  Objective: Vitals last 24 hrs: Vitals:   10/09/23 0557 10/09/23 1239 10/09/23 1934 10/10/23 0613  BP: (!) 148/82 (!) 148/86 127/75 (!) 144/78  Pulse: 87 95 87 86  Resp: 14  14 16   Temp: 97.6 F (36.4 C) (!) 97.4 F (36.3 C) 97.6 F (36.4 C) (!) 97.5 F (36.4 C)  TempSrc:  Oral Oral Oral  SpO2: 99% 99% 99% 95%  Weight:      Height:       Weight change:   Physical Examination: General exam: alert awake, appears ill and frail  HEENT:Oral mucosa moist, Ear/Nose WNL grossly Respiratory system: Bilaterally clear BS,no use of accessory muscle, port on left chest+ dressing Cardiovascular system: S1 & S2 +, No JVD. Gastrointestinal system: Abdomen soft,NT,ND, BS+ Nervous System: Alert, awake, moving all extremities,and following commands. Extremities: LE edema neg,distal peripheral pulses palpable and warm.  Skin: No rashes,no icterus. MSK: Normal muscle bulk,tone, power   Medications reviewed:  Scheduled Meds:  amLODipine  10 mg Oral Daily   Chlorhexidine Gluconate Cloth  6 each Topical Daily   heparin  5,000 Units Subcutaneous Q8H   lactose free nutrition  237 mL Oral BID BM   methylPREDNISolone (SOLU-MEDROL) injection  40 mg Intravenous Q12H   mirtazapine  7.5 mg Oral QHS   senna-docusate  2 tablet Oral QHS   sodium chloride flush  10-40 mL Intracatheter Q12H   sodium chloride flush  3 mL Intravenous Q12H   sodium chloride flush  3 mL Intravenous Q12H   tamsulosin  0.4 mg Oral Daily   Continuous Infusions:  sodium chloride     sodium chloride        Diet Order             Diet regular Room service appropriate? Yes; Fluid consistency: Thin  Diet effective now                   Intake/Output Summary (Last 24 hours) at 10/10/2023 0947 Last data filed at 10/10/2023 0600 Gross per 24 hour  Intake 337 ml  Output 1150 ml  Net -813 ml   Net IO Since Admission: -2,005.07 mL [10/10/23 0947]  Wt Readings from Last 3 Encounters:  10/07/23 89.8 kg  09/30/23 89.8 kg  09/06/23 89.8 kg     Unresulted Labs (From admission, onward)    None      Data Reviewed: I have personally reviewed following labs and  imaging studies CBC: Recent Labs  Lab 10/07/23 0938 10/07/23 1026 10/09/23 0453 10/10/23 0336  WBC 7.5 7.3 4.6 3.6*  NEUTROABS 3.0 2.9  --   --   HGB 11.9* 11.8* 8.9* 8.2*  HCT 39.5 36.6* 27.3* 25.1*  MCV 107.0* 103.4* 101.5* 102.9*  PLT 220 226 PLATELET CLUMPS NOTED ON SMEAR, UNABLE TO ESTIMATE PLATELET CLUMPS NOTED ON SMEAR, COUNT APPEARS ADEQUATE   Basic Metabolic Panel:  Recent Labs  Lab 10/07/23 0938 10/07/23 1026 10/09/23 0453 10/10/23 0336  NA 140 140 140 138  K 4.3 4.3 4.1 4.0  CL 107 106 108 110  CO2 13* 15* 22 20*  GLUCOSE 144* 144* 106* 135*  BUN 25* 25* 23 23  CREATININE 1.34* 1.40* 0.80 0.67  CALCIUM 9.5 9.5 8.2* 7.5*  MG 2.2  --   --   --    GFR: Estimated Creatinine Clearance: 90.2 mL/min (by C-G formula based on SCr of 0.67 mg/dL). Liver Function Tests:  Recent Labs  Lab 10/07/23 0938 10/07/23 1026  AST 35 35  ALT 30 29  ALKPHOS 119 121  BILITOT 1.1 1.3*  PROT 7.8 7.8  ALBUMIN 4.0 3.9  Sepsis Labs: Recent Labs  Lab 10/07/23  1026 10/07/23 1211 10/07/23 1819  LATICACIDVEN >9.0* 3.6* 2.2*  2.2*   Recent Results (from the past 240 hours)  Culture, blood (Routine x 2)     Status: None (Preliminary result)   Collection Time: 10/07/23 10:26 AM   Specimen: BLOOD  Result Value Ref Range Status   Specimen Description BLOOD LEFT ASSIST CONTROL  Final   Special Requests   Final    BOTTLES DRAWN AEROBIC AND ANAEROBIC Blood Culture results may not be optimal due to an inadequate volume of blood received in culture bottles   Culture   Final    NO GROWTH 3 DAYS Performed at [facility], [address] [place], [place] [address]    Report Status PENDING  Incomplete  Culture, blood (Routine x 2)     Status: None (Preliminary result)   Collection Time: 10/07/23 10:31 AM   Specimen: BLOOD  Result Value Ref Range Status   Specimen Description BLOOD LEFT ARM  Final   Special Requests   Final    BOTTLES DRAWN AEROBIC ONLY Blood Culture results may not  be optimal due to an inadequate volume of blood received in culture bottles   Culture   Final    NO GROWTH 3 DAYS Performed at [facility], [address] [place], [place] [address]    Report Status PENDING  Incomplete  Resp panel by RT-PCR (RSV, Flu A&B, Covid) Anterior Nasal Swab     Status: None   Collection Time: 10/07/23 12:06 PM   Specimen: Anterior Nasal Swab  Result Value Ref Range Status   SARS Coronavirus 2 by RT PCR NEGATIVE NEGATIVE Final    Comment: (NOTE) SARS-CoV-2 target nucleic acids are NOT DETECTED.  The SARS-CoV-2 RNA is generally detectable in upper respiratory specimens during the acute phase of infection. The lowest concentration of SARS-CoV-2 viral copies this assay can detect is 138 copies/mL. A negative result does not preclude SARS-Cov-2 infection and should not be used as the sole basis for treatment or other patient management decisions. A negative result may occur with  improper specimen collection/handling, submission of specimen other than nasopharyngeal swab, presence of viral mutation(s) within the areas targeted by this assay, and inadequate number of viral copies(<138 copies/mL). A negative result must be combined with clinical observations, patient history, and epidemiological information. The expected result is Negative.  Fact Sheet for Patients:  BloggerCourse.com  Fact Sheet for Healthcare Providers:  SeriousBroker.it  This test is no t yet approved or cleared by the [place] FDA and  has been authorized for detection and/or diagnosis of SARS-CoV-2 by FDA under an Emergency Use Authorization (EUA). This EUA will remain  in effect (meaning this test can be used) for the duration of the COVID-19 declaration under Section 564(b)(1) of the Act, 21 U.S.C.section 360bbb-3(b)(1), unless the authorization is terminated  or revoked sooner.       Influenza A by PCR NEGATIVE NEGATIVE Final    Influenza B by PCR NEGATIVE NEGATIVE Final    Comment: (NOTE) The Xpert Xpress SARS-CoV-2/FLU/RSV plus assay is intended as an aid in the diagnosis of influenza from Nasopharyngeal swab specimens and should not be used as a sole basis for treatment. Nasal washings and aspirates are unacceptable for Xpert Xpress SARS-CoV-2/FLU/RSV testing.  Fact Sheet for Patients: BloggerCourse.com  Fact Sheet for Healthcare Providers: SeriousBroker.it  This test is not yet approved or cleared by the [place] FDA and has been authorized for detection and/or diagnosis of SARS-CoV-2 by FDA under an Emergency  Use Authorization (EUA). This EUA will remain in effect (meaning this test can be used) for the duration of the COVID-19 declaration under Section 564(b)(1) of the Act, 21 U.S.C. section 360bbb-3(b)(1), unless the authorization is terminated or revoked.     Resp Syncytial Virus by PCR NEGATIVE NEGATIVE Final    Comment: (NOTE) Fact Sheet for Patients: BloggerCourse.com  Fact Sheet for Healthcare Providers: SeriousBroker.it  This test is not yet approved or cleared by the [place] FDA and has been authorized for detection and/or diagnosis of SARS-CoV-2 by FDA under an Emergency Use Authorization (EUA). This EUA will remain in effect (meaning this test can be used) for the duration of the COVID-19 declaration under Section 564(b)(1) of the Act, 21 U.S.C. section 360bbb-3(b)(1), unless the authorization is terminated or revoked.  Performed at [facility], [address], [place], [place] [address]   C Difficile Quick Screen w PCR reflex     Status: None   Collection Time: 10/08/23  1:55 PM   Specimen: STOOL  Result Value Ref Range Status   C Diff antigen NEGATIVE NEGATIVE Final   C Diff toxin NEGATIVE NEGATIVE Final   C Diff interpretation No C. difficile detected.  Final     Comment: NEGATIVE Performed at [facility], [address] [place], [place] [address]     Antimicrobials/Microbiology: Anti-infectives (From admission, onward)    None         Component Value Date/Time   SDES BLOOD LEFT ARM 10/07/2023 1031   SPECREQUEST  10/07/2023 1031    BOTTLES DRAWN AEROBIC ONLY Blood Culture results may not be optimal due to an inadequate volume of blood received in culture bottles   CULT  10/07/2023 1031    NO GROWTH 3 DAYS Performed at [facility], [address], [place], [place] [address]    REPTSTATUS PENDING 10/07/2023 1031     Radiology Studies: No results found.   LOS: 3 days   Total time spent in review of labs and imaging, patient evaluation, formulation of plan, documentation and communication with family: 35 minutes  Lanae Boast, MD  [facility]  10/10/2023, 9:47 AM

## 2023-10-11 ENCOUNTER — Ambulatory Visit (HOSPITAL_COMMUNITY): Payer: Medicare PPO | Admitting: Physical Therapy

## 2023-10-11 ENCOUNTER — Encounter (HOSPITAL_COMMUNITY): Payer: Self-pay | Admitting: Physical Therapy

## 2023-10-11 ENCOUNTER — Other Ambulatory Visit: Payer: Self-pay

## 2023-10-11 ENCOUNTER — Ambulatory Visit
Admit: 2023-10-11 | Discharge: 2023-10-11 | Disposition: A | Discharge status: Home / Self Care | Payer: Medicare PPO | Attending: Radiation Oncology | Admitting: Radiation Oncology

## 2023-10-11 DIAGNOSIS — C7951 Secondary malignant neoplasm of bone: Secondary | ICD-10-CM | POA: Diagnosis not present

## 2023-10-11 LAB — RAD ONC ARIA SESSION SUMMARY
Course Elapsed Days: 3
Plan Fractions Treated to Date: 2
Plan Fractions Treated to Date: 2
Plan Prescribed Dose Per Fraction: 3 Gy
Plan Prescribed Dose Per Fraction: 3 Gy
Plan Total Fractions Prescribed: 10
Plan Total Fractions Prescribed: 10
Plan Total Prescribed Dose: 30 Gy
Plan Total Prescribed Dose: 30 Gy
Reference Point Dosage Given to Date: 6 Gy
Reference Point Dosage Given to Date: 6 Gy
Reference Point Session Dosage Given: 3 Gy
Reference Point Session Dosage Given: 3 Gy
Session Number: 2

## 2023-10-11 LAB — GLUCOSE, CAPILLARY
Glucose-Capillary: 115 mg/dL — ABNORMAL HIGH (ref 70–99)
Glucose-Capillary: 131 mg/dL — ABNORMAL HIGH (ref 70–99)
Glucose-Capillary: 133 mg/dL — ABNORMAL HIGH (ref 70–99)

## 2023-10-11 NOTE — Plan of Care (Signed)
  Problem: Education: Goal: Knowledge of General Education information will improve Description: Including pain rating scale, medication(s)/side effects and non-pharmacologic comfort measures 10/11/2023 0836 by Lennox Pippins, RN Outcome: Progressing 10/10/2023 1931 by Lennox Pippins, RN Outcome: Progressing   Problem: Health Behavior/Discharge Planning: Goal: Ability to manage health-related needs will improve 10/11/2023 0836 by Lennox Pippins, RN Outcome: Progressing 10/10/2023 1931 by Lennox Pippins, RN Outcome: Progressing   Problem: Clinical Measurements: Goal: Ability to maintain clinical measurements within normal limits will improve 10/11/2023 0836 by Lennox Pippins, RN Outcome: Progressing 10/10/2023 1931 by Lennox Pippins, RN Outcome: Progressing Goal: Will remain free from infection 10/11/2023 0836 by Lennox Pippins, RN Outcome: Progressing 10/10/2023 1931 by Lennox Pippins, RN Outcome: Progressing Goal: Diagnostic test results will improve 10/11/2023 0836 by Lennox Pippins, RN Outcome: Progressing 10/10/2023 1931 by Lennox Pippins, RN Outcome: Progressing Goal: Respiratory complications will improve 10/11/2023 0836 by Lennox Pippins, RN Outcome: Progressing 10/10/2023 1931 by Lennox Pippins, RN Outcome: Progressing Goal: Cardiovascular complication will be avoided 10/11/2023 0836 by Lennox Pippins, RN Outcome: Progressing 10/10/2023 1931 by Lennox Pippins, RN Outcome: Progressing   Problem: Activity: Goal: Risk for activity intolerance will decrease 10/11/2023 0836 by Lennox Pippins, RN Outcome: Progressing 10/10/2023 1931 by Lennox Pippins, RN Outcome: Progressing   Problem: Nutrition: Goal: Adequate nutrition will be maintained 10/11/2023 0836 by Lennox Pippins, RN Outcome: Progressing 10/10/2023 1931 by Lennox Pippins, RN Outcome: Progressing   Problem: Coping: Goal: Level of anxiety will decrease 10/11/2023 0836 by Lennox Pippins, RN Outcome: Progressing 10/10/2023 1931 by Lennox Pippins, RN Outcome: Progressing   Problem: Elimination: Goal: Will not experience complications related to bowel motility 10/11/2023 0836 by Lennox Pippins, RN Outcome: Progressing 10/10/2023 1931 by Lennox Pippins, RN Outcome: Progressing Goal: Will not experience complications related to urinary retention 10/11/2023 0836 by Lennox Pippins, RN Outcome: Progressing 10/10/2023 1931 by Lennox Pippins, RN Outcome: Progressing   Problem: Pain Managment: Goal: General experience of comfort will improve and/or be controlled 10/11/2023 0836 by Lennox Pippins, RN Outcome: Progressing 10/10/2023 1931 by Lennox Pippins, RN Outcome: Progressing   Problem: Safety: Goal: Ability to remain free from injury will improve 10/11/2023 0836 by Lennox Pippins, RN Outcome: Progressing 10/10/2023 1931 by Lennox Pippins, RN Outcome: Progressing   Problem: Skin Integrity: Goal: Risk for impaired skin integrity will decrease 10/11/2023 0836 by Lennox Pippins, RN Outcome: Progressing 10/10/2023 1931 by Lennox Pippins, RN Outcome: Progressing

## 2023-10-11 NOTE — Therapy (Signed)
PHYSICAL THERAPY DISCHARGE SUMMARY  Visits from Start of Care: 18  Current functional level related to goals / functional outcomes: PT has active cancer, limited mobility seen for pressure ulcers.  Health declined and he is in the hospital.  Initally wounds were improving then they started progressing again.    Remaining deficits: Pressure wounds    Education / Equipment: Attempt to keep pressure off of wound s   Patient agrees to discharge. Patient goals were not met. Patient is being discharged due to not returning since the last visit.as pt has been admitted into the hospital.  Virgina Organ, PT CLT [phone]

## 2023-10-11 NOTE — TOC Progression Note (Signed)
Transition of Care [facility]) - Progression Note    Patient Details  Name: David [name] MRN: [id number] Date of Birth: 09/13/1951  Transition of Care [facility]) CM/SW Contact  Otelia Santee, LCSW Phone Number: 10/11/2023, 1:32 PM  Clinical Narrative:    Met with pt and confirmed plan for SNF at discharge. Pt denies being to SNF in the past and hopes to stay in [facility]. SNF work up complete and awaiting bed offers. CSW discussed recommendation for palliative care services. Pt agreeable to this. Referral made to [facility] for outpatient palliative care services.  Spoke with pt's sister to inform of plan. Pt's sister agreeable to plan but, had multiple questions regarding pt's diagnosis and treatment. Message sent to MD to inform of sister's request to speak to him.     Expected Discharge Plan:  (TBD) Barriers to Discharge: Continued Medical Work up  Expected Discharge Plan and Services       Living arrangements for the past 2 months: Single Family Home                                       Social Determinants of Health (SDOH) Interventions SDOH Screenings   Food Insecurity: No Food Insecurity (10/07/2023)  Housing: Low Risk  (10/07/2023)  Transportation Needs: No Transportation Needs (10/07/2023)  Utilities: Not At Risk (10/07/2023)  Depression (PHQ2-9): Low Risk  (01/12/2023)  Financial Resource Strain: Low Risk  (03/24/2023)   Received from [facility]  Recent Concern: Financial Resource Strain - High Risk (02/01/2023)  Social Connections: Unknown (03/07/2023)   Received from [facility]  Stress: Patient Declined (03/07/2023)   Received from [facility]  Tobacco Use: High Risk (10/09/2023)    Readmission Risk Interventions     No data to display

## 2023-10-11 NOTE — Evaluation (Signed)
Occupational Therapy Evaluation Patient Details Name: David [name] MRN: [id number] DOB: Aug 16, 1951 Today's Date: 10/11/2023   History of Present Illness Pt is 73 yo male admitted on 10/07/23 with metastatic cancer to bone.  Pt transferred to [facility] from [facility] for radiation oncology treatment.  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   Clinical Impression   Pt presents with decline in function and safety with ADLs and ADL mobility with impaired strength, balance and endurance; pt is limited by pain at multiple sites. PTA pt lived at alone and was Ind with ADLs/selfcare, cooking, uses a cane for mobility, bathes at sink and does not drive. Pt currently requires max A + to sit EOB but unable to tolerate EOB sitting for ADLs at this time; pt limited by pain and unable to balance trunk/UB seated EOB, leaning heavily to R side due to L side pain and e had received oxycodone about 1 hr prior to session. Pt would benefit from acute OT services to address impairment to maximize level of function and safety      If plan is discharge home, recommend the following: A lot of help with bathing/dressing/bathroom;Two people to help with walking and/or transfers;Assistance with cooking/housework;Assist for transportation;Help with stairs or ramp for entrance    Functional Status Assessment  Patient has had a recent decline in their functional status and demonstrates the ability to make significant improvements in function in a reasonable and predictable amount of time.  Equipment Recommendations  Other (comment) (TBD at next venue of care)    Recommendations for Other Services       Precautions / Restrictions Precautions Precautions: Fall Precaution Comments: Pain - premedicate Restrictions Weight Bearing Restrictions Per Provider Order: No      Mobility Bed Mobility Overal bed mobility: Needs Assistance Bed Mobility: Supine to Sit,  Sit to Supine     Supine to sit: Max assist, +2 for physical assistance Sit to supine: Max assist, +2 for physical assistance   General bed mobility comments: Started log roll technique but pt painful on L side (possibly try to R next visit); Required max x 2 for supine/sit    Transfers                   General transfer comment: Limited by pain unable to progress past EOB      Balance Overall balance assessment: Needs assistance Sitting-balance support: Bilateral upper extremity supported Sitting balance-Leahy Scale: Poor Sitting balance - Comments: Bil UE support and mod A; when UE lifted would lean/fall to that side; limited by pain                                   ADL either performed or assessed with clinical judgement   ADL Overall ADL's : Needs assistance/impaired Eating/Feeding: Set up;Sitting;Bed level   Grooming: Wash/dry hands;Wash/dry face;Set up;Supervision/safety;Bed level   Upper Body Bathing: Maximal assistance   Lower Body Bathing: Total assistance   Upper Body Dressing : Maximal assistance   Lower Body Dressing: Total assistance     Toilet Transfer Details (indicate cue type and reason): unable to tolerate at this time due to pain Toileting- Clothing Manipulation and Hygiene: Total assistance;Bed level         General ADL Comments: pt unable to tolerate EOB sitting for ADLs at this time; pt limited by pain and  unable to balance trunk/UB seated EOB, leaning heavily to R side due to L side pain.     Vision Ability to See in Adequate Light: 0 Adequate Patient Visual Report: No change from baseline       Perception         Praxis         Pertinent Vitals/Pain Pain Assessment Pain Assessment: Faces Pain Location: L ribs, back, L shoulder Pain Descriptors / Indicators: Sharp, Grimacing Pain Intervention(s): Premedicated before session, Limited activity within patient's tolerance, Monitored during session, Repositioned      Extremity/Trunk Assessment Upper Extremity Assessment Upper Extremity Assessment: Defer to OT evaluation   Lower Extremity Assessment Lower Extremity Assessment: Defer to PT evaluation RLE Deficits / Details: Limited by pain ; ROM WFL; MMT 3/5 knee ext, 1/5 hip , 3/5 ankle LLE Deficits / Details: Limited by pain ; ROM WFL; MMT 3/5 knee ext, 1/5 hip, 3/5 ankle   Cervical / Trunk Assessment Cervical / Trunk Assessment: Kyphotic;Other exceptions Cervical / Trunk Exceptions: severe back pain, weak trunk   Communication     Cognition Arousal: Alert Behavior During Therapy: WFL for tasks assessed/performed Overall Cognitive Status: Within Functional Limits for tasks assessed                                 General Comments: Willing to try therapy but then limited by severe pain in sitting; eases at rest     General Comments  VSS on RA but pt with increased RR with activity due to pain    Exercises     Shoulder Instructions      Home Living Family/patient expects to be discharged to:: Private residence Living Arrangements: Alone Available Help at Discharge: Friend(s);Available PRN/intermittently Type of Home: House Home Access: Stairs to enter [employer] of Steps: 4 in front, 7 in back Entrance Stairs-Rails: Right;Left Home Layout: One level     Bathroom Shower/Tub: Chief Strategy Officer: Handicapped height     Home Equipment: Agricultural consultant (2 wheels);Cane - single point;BSC/3in1          Prior Functioning/Environment               Mobility Comments: reports that he mostly uses his cane ADLs Comments: reports Ind with ADLs/selfcare, sink bathes        OT Problem List: Decreased strength;Impaired balance (sitting and/or standing);Pain;Decreased activity tolerance;Decreased coordination;Decreased knowledge of use of DME or AE      OT Treatment/Interventions: Self-care/ADL training;DME and/or AE  instruction;Therapeutic activities;Balance training;Therapeutic exercise;Patient/family education;Energy conservation    OT Goals(Current goals can be found in the care plan section) Acute Rehab OT Goals Patient Stated Goal: less pain OT Goal Formulation: With patient Time For Goal Achievement: 10/25/23 Potential to Achieve Goals: Good ADL Goals Pt Will Perform Grooming: with min assist;with contact guard assist;sitting Pt Will Perform Upper Body Bathing: with mod assist;with min assist;sitting Pt Will Perform Upper Body Dressing: with mod assist;with min assist;sitting Pt Will Transfer to Toilet: with max assist;with mod assist;stand pivot transfer;bedside commode Additional ADL Goal #1: Pt will complete bed mobility mod A to sit EOB in prep for  ADL tasks  OT Frequency: Min 1X/week    Co-evaluation PT/OT/SLP Co-Evaluation/Treatment: Yes Reason for Co-Treatment: Complexity of the patient's impairments (multi-system involvement);For patient/therapist safety          AM-PAC OT "6 Clicks" Daily Activity     Outcome Measure Help  from another person eating meals?: None Help from another person taking care of personal grooming?: A Little Help from another person toileting, which includes using toliet, bedpan, or urinal?: Total Help from another person bathing (including washing, rinsing, drying)?: Total Help from another person to put on and taking off regular upper body clothing?: A Lot Help from another person to put on and taking off regular lower body clothing?: Total 6 Click Score: 8   End of Session    Activity Tolerance: No increased pain;Patient limited by fatigue Patient left: in bed;with call bell/phone within reach  OT Visit Diagnosis: Muscle weakness (generalized) (M62.81);Pain Pain - Right/Left: Left Pain - part of body:  (back, L side UB)                Time: 5188-4166 OT Time Calculation (min): 16 min Charges:  OT General Charges $OT Visit: 1 Visit OT  Evaluation $OT Eval Moderate Complexity: 1 Mod    Galen Manila 10/11/2023, 12:15 PM

## 2023-10-11 NOTE — Progress Notes (Signed)
Daily Progress Note   Patient Name: David [name]       Date: 10/11/2023 DOB: 09-01-1951  Age: 73 y.o. MRN#: [id number] Attending Physician: Lanae Boast, MD Primary Care Physician: Assunta Found, MD Admit Date: 10/07/2023  Reason for Consultation/Follow-up: Establishing goals of care  Subjective:  Awake alert, just got back from radiation, "it was painful"  Discussed with patient about using IV and PO PRN opioids for pain. Used only one dose of 5 mg PO Oxy IR in the past 24 hours.   Length of Stay: 4  Current Medications: Scheduled Meds:   amLODipine  10 mg Oral Daily   Chlorhexidine Gluconate Cloth  6 each Topical Daily   heparin  5,000 Units Subcutaneous Q8H   lactose free nutrition  237 mL Oral BID BM   methylPREDNISolone (SOLU-MEDROL) injection  40 mg Intravenous Q12H   mirtazapine  7.5 mg Oral QHS   senna-docusate  2 tablet Oral QHS   sodium chloride flush  10-40 mL Intracatheter Q12H   sodium chloride flush  3 mL Intravenous Q12H   sodium chloride flush  3 mL Intravenous Q12H   tamsulosin  0.4 mg Oral Daily    Continuous Infusions:  sodium chloride      PRN Meds: acetaminophen **OR** acetaminophen, bisacodyl, dextromethorphan-guaiFENesin, HYDROmorphone (DILAUDID) injection, labetalol, ondansetron **OR** ondansetron (ZOFRAN) IV, oxyCODONE, sodium chloride flush, sodium chloride flush  Physical Exam         Awake alert Appears chronically ill No distress Mild generalized abdominal tenderness No edema Oriented  Vital Signs: BP 130/61 (BP Location: Left Arm)   Pulse 75   Temp 97.6 F (36.4 C)   Resp 17   Ht 5\' 11"  (1.803 m)   Wt 89.8 kg   SpO2 100%   BMI 27.61 kg/m  SpO2: SpO2: 100 % O2 Device: O2 Device: Room Air O2 Flow Rate: O2 Flow Rate (L/min): 2  L/min  Intake/output summary:  Intake/Output Summary (Last 24 hours) at 10/11/2023 1059 Last data filed at 10/11/2023 0315 Gross per 24 hour  Intake 480 ml  Output 1775 ml  Net -1295 ml   LBM: Last BM Date : 10/10/23 Baseline Weight: Weight: 89.8 kg Most recent weight: Weight: 89.8 kg       Palliative Assessment/Data:      Patient Active Problem List  Diagnosis Date Noted   Metastatic cancer to bone (HCC) 10/07/2023   Diarrhea in adult patient 10/07/2023   Clear cell renal cell carcinoma, 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  Seen and evaluated by radiation oncology after having been transferred from [facility].  Plan is for the patient to receive 30 GY in 10 fractions to 2 spinal sites T7-T8 as well as T12. Palliative consult for ongoing goals of care discussions has been requested  Recommendations/Plan:   Continue current pain and non pain symptom management regimen and monitor. Encouraged patient to ask for PRN opioids for pain.  Full Code Full Scope care Only relative and next of kin is sister [name] from [place], [place]. She spoke with PMT, TOC and is trying to secure HCPOA paperwork designating her as patient's medical decision maker.   Code Status:    Code Status Orders  (From admission, onward)           Start     Ordered    10/07/23 1559  Full code  Continuous       Question:  By:  Answer:  Consent: discussion documented in EHR   10/07/23 1559           Code Status History     Date Active Date Inactive Code Status Order ID Comments User Context   08/13/2022 1509 08/16/2022 2240 Full Code [id number]  Malen Gauze, MD Inpatient   07/23/2022 2055 07/24/2022 1729 Full Code [id number]  Elgergawy, Leana Roe, MD Inpatient       Prognosis:  Unable to determine  Discharge Planning: Skilled Nursing Facility for rehab with Palliative care service follow-up  Care plan was discussed with  patient.   Thank you for allowing the Palliative Medicine Team to assist in the care of this patient.  low MDM     Greater than 50%  of this time was spent counseling and coordinating care related to the above assessment and plan.  Rosalin Hawking, MD  Please contact Palliative Medicine Team phone at [phone] for questions and concerns.

## 2023-10-11 NOTE — [place] FL2 (Signed)
[place] MEDICAID FL2 LEVEL OF CARE FORM     IDENTIFICATION  Patient Name: David [name] Birthdate: 09-01-1951 Sex: male Admission Date (Current Location): 10/07/2023  [facility] and [place] Number:  Producer, television/film/video and Address:  [facility],  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   Abdominal adhesions 08/13/2022   AKI (acute kidney injury) (HCC) 07/23/2022   Renal mass 07/23/2022   Lung nodule 07/23/2022   Urinary retention 07/23/2022   BPH (benign prostatic hyperplasia) 07/23/2022   Hepatitis C virus infection cured after antiviral drug therapy 07/13/2019   Elevated LFTs 07/13/2019   Seizure disorder (HCC) 01/14/2012   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    Orientation RESPIRATION BLADDER Height & Weight     Self, Time, Situation, Place  Normal Incontinent Weight: 197 lb 15.6 oz (89.8 kg) Height:  5\' 11"  (180.3 cm)  BEHAVIORAL SYMPTOMS/MOOD NEUROLOGICAL BOWEL NUTRITION STATUS      Continent Diet (Regular)  AMBULATORY STATUS COMMUNICATION OF NEEDS Skin   Extensive Assist Verbally Surgical wounds                       Personal Care Assistance Level of Assistance  Bathing, Feeding, Dressing Bathing Assistance: Maximum assistance Feeding assistance: Limited assistance Dressing Assistance: Maximum assistance     Functional Limitations Info  Sight, Hearing, Speech Sight Info: Adequate Hearing Info: Adequate Speech Info:  Adequate    SPECIAL CARE FACTORS FREQUENCY  PT (By licensed PT), OT (By licensed OT)     PT Frequency: 5x/wk OT Frequency: 5x/wk            Contractures Contractures Info: Not present    Additional Factors Info  Code Status, Allergies Code Status Info: FULL Allergies Info: No Known Allergies           Current Medications (10/11/2023):  This is the current hospital active medication list Current Facility-Administered Medications  Medication Dose Route Frequency Provider Last Rate Last Admin   acetaminophen (TYLENOL) tablet 650 mg  650 mg Oral Q6H PRN Emokpae, Courage, MD       Or   acetaminophen (TYLENOL) suppository 650 mg  650 mg Rectal Q6H PRN Emokpae, Courage, MD       amLODipine (NORVASC) tablet 10 mg  10 mg Oral Daily Emokpae, Courage, MD   10 mg at 10/11/23 0956   bisacodyl (DULCOLAX) suppository 10 mg  10 mg Rectal Daily PRN Shon Hale, MD       Chlorhexidine Gluconate Cloth 2 % PADS 6 each  6 each Topical Daily Emokpae, Courage, MD   6 each at 10/10/23 1817   dextromethorphan-guaiFENesin (MUCINEX DM) 30-600 MG per 12 hr tablet 1 tablet  1 tablet Oral BID PRN Anthoney Harada, NP   1 tablet at 10/08/23 2157   heparin injection 5,000 Units  5,000 Units Subcutaneous Q8H Shon Hale, MD   5,000 Units at 10/11/23 0533   HYDROmorphone (DILAUDID) injection  0.5 mg  0.5 mg Intravenous Q4H PRN Emokpae, Courage, MD       labetalol (NORMODYNE) injection 10 mg  10 mg Intravenous Q4H PRN Emokpae, Courage, MD       lactose free nutrition (BOOST PLUS) liquid 237 mL  237 mL Oral BID BM Loyce Dys, MD   237 mL at 10/10/23 1822   methylPREDNISolone sodium succinate (SOLU-MEDROL) 40 mg/mL injection 40 mg  40 mg Intravenous Q12H Emokpae, Courage, MD   40 mg at 10/11/23 0752   mirtazapine (REMERON) tablet 7.5 mg  7.5 mg Oral QHS Emokpae, Courage, MD   7.5 mg at 10/10/23 2201   ondansetron (ZOFRAN) tablet 4 mg  4 mg Oral Q6H PRN Emokpae, Courage, MD       Or   ondansetron  (ZOFRAN) injection 4 mg  4 mg Intravenous Q6H PRN Emokpae, Courage, MD       oxyCODONE (Oxy IR/ROXICODONE) immediate release tablet 5 mg  5 mg Oral Q4H PRN Mariea Clonts, Courage, MD   5 mg at 10/11/23 1000   senna-docusate (Senokot-S) tablet 2 tablet  2 tablet Oral QHS Emokpae, Courage, MD   2 tablet at 10/10/23 2201   sodium chloride 0.9 % bolus 1,000 mL  1,000 mL Intravenous Once Emokpae, Courage, MD       sodium chloride flush (NS) 0.9 % injection 10-40 mL  10-40 mL Intracatheter Q12H Emokpae, Courage, MD   10 mL at 10/11/23 0957   sodium chloride flush (NS) 0.9 % injection 10-40 mL  10-40 mL Intracatheter PRN Emokpae, Courage, MD       sodium chloride flush (NS) 0.9 % injection 3 mL  3 mL Intravenous Q12H Emokpae, Courage, MD   3 mL at 10/11/23 0958   sodium chloride flush (NS) 0.9 % injection 3 mL  3 mL Intravenous Q12H Emokpae, Courage, MD   3 mL at 10/11/23 0957   sodium chloride flush (NS) 0.9 % injection 3 mL  3 mL Intravenous PRN Emokpae, Courage, MD       tamsulosin (FLOMAX) capsule 0.4 mg  0.4 mg Oral Daily Emokpae, Courage, MD   0.4 mg at 10/11/23 1610     Discharge Medications: Please see discharge summary for a list of discharge medications.  Relevant Imaging Results:  Relevant Lab Results:   Additional Information SSN: [id number]  Otelia Santee, LCSW

## 2023-10-11 NOTE — Progress Notes (Signed)
PROGRESS NOTE David [name]  David [name] DOB: [birth year] DOA: 10/07/2023 PCP: Assunta Found, MD  Brief Narrative/Hospital Course:  73 y.o. male smoker with past medical history relevant for history of hep C previously treated with antivirals, HTN, and  Metastatic left kidney clear-cell RCC, s/p Left robotic assisted laparoscopic radical nephrectomy by Dr. Ronne Binning on 08/13/2022 with Pathology: at that time showing 7 cm clear-cell RCC with focal sarcomatoid and rhabdoid features, nuclear grade 4.  Tumor extended into the renal vein and renal sinus fat. PET scan (12/24/2022): Enlarging hypermetabolic left adrenal metastasis.  2 small hypermetabolic soft tissue nodules in the left nephrectomy bed.  Hypermetabolic lytic lesions involving distal left clavicle and left temporal bone.  Indeterminate small hypermetabolic parotid nodules bilaterally. 1/23>Transferred from [name] for radiation oncology treatment Palliative care following.   Subjective: Patient seen and examined this morning Reports diarrhea is improving has not been up and moving On the way to radiation therapy today Overnight afebrile, BP remains stable  Complains of pain on his left side worse with moving  Assessment and Plan: Principal Problem:   Metastatic cancer to bone [facility]) Active Problems:   Clear cell renal cell carcinoma, left (HCC)   Diarrhea in adult patient   Seizure disorder (HCC)   AKI (acute kidney injury) (HCC)   Metastatic left kidney RCC Status post left robotic assisted laparoscopic radical nephrectomy 07/27/2022: Patient is followed by oncology at any pain and has received different regimen including lenvatinib Keytruda car bozantinib-see previous note  CTA Chest 1/23>severe Thoracic Spinal Metastatic Disease with marked progression since an August PET-CT with four levels affected (T7, T8, T11, T12) pathologic vertebral fractures, destruction of the posterior right 8th rib, and tumor extension into the  spinal canal. Associated prevertebral lymph node metastases.Multifocal liver metastases, new since 2023 and individually up to 2.3 cm.Trace layering pleural effusions. No pulmonary embolus identified. Transferred from [facility] to [facility] for XRT s/p 1st XRT 1/24 and going for XRT again Continue IV steroid pain management , palliative care following  Continue PT OT and will plan for skilled nursing facility   Dehydration/lactic acidosis: In the setting of dehydration and metastatic cancer. No leukocytosis no evidence of UTI no fever chills CTA chest without evidence of acute infection and no evidence of sepsis at this time.   Encourage oral intake   Acute diarrhea: Has been having diarrhea in the setting of his chemotherapy History of COVID-negative C. difficile antigen and toxin.  Improving on steroid   AKI: Due to diarrhea, resolved.  Discontinue IV fluids after current bag expires encourage oral intake  Hypertension: BP stabl on amlodipine 10 mg.  Continue Flomax Continue Remeron at bedtime  Unintentional weight loss Nutrition Problem: Unintentional weight loss Etiology: cancer and cancer related treatments (renal cell cancer on chemotherapy) Signs/Symptoms: percent weight loss (20.5% in 8 months) Percent weight loss: 20.5 % (in 8 months) Interventions: Boost Plus, Refer to RD note for recommendations    DVT prophylaxis: heparin injection 5,000 Units Start: 10/07/23 2200 SCDs Start: 10/07/23 1559 Place TED hose Start: 10/07/23 1559 Code Status:   Code Status: Full Code Family Communication: plan of care discussed with patient at bedside. Patient status is: Remains hospitalized because of severity of illness Level of care: Med-Surg   Dispo: The patient is from: home            Anticipated disposition: likely SNF once okay with radiation oncology next 1-2 days  Objective: Vitals last 24 hrs: Vitals:   10/10/23 8119 10/10/23 1238  10/10/23 1925 10/11/23 0446  BP: (!) 144/78 (!)  147/80 108/63 130/61  Pulse: 86 92 (!) 101 75  Resp: 16 18 20 17   Temp: (!) 97.5 F (36.4 C) (!) 97.5 F (36.4 C) 97.6 F (36.4 C) 97.6 F (36.4 C)  TempSrc: Oral Oral    SpO2: 95% 100% 99% 100%  Weight:      Height:       Weight change:   Physical Examination: General exam: alert awake, ill-appearing  HEENT:Oral mucosa moist, Ear/Nose WNL grossly Respiratory system: Bilaterally clear BS,no use of accessory muscle Cardiovascular system: S1 & S2 +, No JVD. Gastrointestinal system: Abdomen soft,NT,ND, BS+ Nervous System: Alert, awake, moving all extremities,and following commands. Extremities: LE edema neg,distal peripheral pulses palpable and warm.  Skin: No rashes,no icterus. MSK: Normal muscle bulk,tone, power   Medications reviewed:  Scheduled Meds:  amLODipine  10 mg Oral Daily   Chlorhexidine Gluconate Cloth  6 each Topical Daily   heparin  5,000 Units Subcutaneous Q8H   lactose free nutrition  237 mL Oral BID BM   methylPREDNISolone (SOLU-MEDROL) injection  40 mg Intravenous Q12H   mirtazapine  7.5 mg Oral QHS   senna-docusate  2 tablet Oral QHS   sodium chloride flush  10-40 mL Intracatheter Q12H   sodium chloride flush  3 mL Intravenous Q12H   sodium chloride flush  3 mL Intravenous Q12H   tamsulosin  0.4 mg Oral Daily   Continuous Infusions:  sodium chloride        Diet Order             Diet regular Room service appropriate? Yes; Fluid consistency: Thin  Diet effective now                   Intake/Output Summary (Last 24 hours) at 10/11/2023 0849 Last data filed at 10/11/2023 0315 Gross per 24 hour  Intake 720 ml  Output 1775 ml  Net -1055 ml   Net IO Since Admission: -3,060.07 mL [10/11/23 0849]  Wt Readings from Last 3 Encounters:  10/07/23 89.8 kg  09/30/23 89.8 kg  09/06/23 89.8 kg     Unresulted Labs (From admission, onward)    None      Data Reviewed: I have personally reviewed following labs and imaging studies CBC: Recent  Labs  Lab 10/07/23 0938 10/07/23 1026 10/09/23 0453 10/10/23 0336  WBC 7.5 7.3 4.6 3.6*  NEUTROABS 3.0 2.9  --   --   HGB 11.9* 11.8* 8.9* 8.2*  HCT 39.5 36.6* 27.3* 25.1*  MCV 107.0* 103.4* 101.5* 102.9*  PLT 220 226 PLATELET CLUMPS NOTED ON SMEAR, UNABLE TO ESTIMATE PLATELET CLUMPS NOTED ON SMEAR, COUNT APPEARS ADEQUATE   Basic Metabolic Panel:  Recent Labs  Lab 10/07/23 0938 10/07/23 1026 10/08/23 1130 10/09/23 0453 10/10/23 0336  NA 140 140 140 140 138  K 4.3 4.3 3.7 4.1 4.0  CL 107 106 109 108 110  CO2 13* 15* 20* 22 20*  GLUCOSE 144* 144* 144* 106* 135*  BUN 25* 25* 20 23 23   CREATININE 1.34* 1.40* 0.90 0.80 0.67  CALCIUM 9.5 9.5 8.1* 8.2* 7.5*  MG 2.2  --   --   --   --    GFR: Estimated Creatinine Clearance: 90.2 mL/min (by C-G formula based on SCr of 0.67 mg/dL). Liver Function Tests:  Recent Labs  Lab 10/07/23 0938 10/07/23 1026  AST 35 35  ALT 30 29  ALKPHOS 119 121  BILITOT 1.1 1.3*  PROT  7.8 7.8  ALBUMIN 4.0 3.9  Sepsis Labs: Recent Labs  Lab 10/07/23 1026 10/07/23 1211 10/07/23 1819  LATICACIDVEN >9.0* 3.6* 2.2*  2.2*   Recent Results (from the past 240 hours)  Culture, blood (Routine x 2)     Status: None (Preliminary result)   Collection Time: 10/07/23 10:26 AM   Specimen: BLOOD  Result Value Ref Range Status   Specimen Description BLOOD LEFT ASSIST CONTROL  Final   Special Requests   Final    BOTTLES DRAWN AEROBIC AND ANAEROBIC Blood Culture results may not be optimal due to an inadequate volume of blood received in culture bottles   Culture   Final    NO GROWTH 4 DAYS Performed at [facility], [address], [place], [place] [address]    Report Status PENDING  Incomplete  Culture, blood (Routine x 2)     Status: None (Preliminary result)   Collection Time: 10/07/23 10:31 AM   Specimen: BLOOD  Result Value Ref Range Status   Specimen Description BLOOD LEFT ARM  Final   Special Requests   Final    BOTTLES DRAWN AEROBIC ONLY Blood  Culture results may not be optimal due to an inadequate volume of blood received in culture bottles   Culture   Final    NO GROWTH 4 DAYS Performed at [facility], [address] [place], [place] [address]    Report Status PENDING  Incomplete  Resp panel by RT-PCR (RSV, Flu A&B, Covid) Anterior Nasal Swab     Status: None   Collection Time: 10/07/23 12:06 PM   Specimen: Anterior Nasal Swab  Result Value Ref Range Status   SARS Coronavirus 2 by RT PCR NEGATIVE NEGATIVE Final    Comment: (NOTE) SARS-CoV-2 target nucleic acids are NOT DETECTED.  The SARS-CoV-2 RNA is generally detectable in upper respiratory specimens during the acute phase of infection. The lowest concentration of SARS-CoV-2 viral copies this assay can detect is 138 copies/mL. A negative result does not preclude SARS-Cov-2 infection and should not be used as the sole basis for treatment or other patient management decisions. A negative result may occur with  improper specimen collection/handling, submission of specimen other than nasopharyngeal swab, presence of viral mutation(s) within the areas targeted by this assay, and inadequate number of viral copies(<138 copies/mL). A negative result must be combined with clinical observations, patient history, and epidemiological information. The expected result is Negative.  Fact Sheet for Patients:  BloggerCourse.com  Fact Sheet for Healthcare Providers:  SeriousBroker.it  This test is no t yet approved or cleared by the [place] FDA and  has been authorized for detection and/or diagnosis of SARS-CoV-2 by FDA under an Emergency Use Authorization (EUA). This EUA will remain  in effect (meaning this test can be used) for the duration of the COVID-19 declaration under Section 564(b)(1) of the Act, 21 U.S.C.section 360bbb-3(b)(1), unless the authorization is terminated  or revoked sooner.       Influenza A by PCR  NEGATIVE NEGATIVE Final   Influenza B by PCR NEGATIVE NEGATIVE Final    Comment: (NOTE) The Xpert Xpress SARS-CoV-2/FLU/RSV plus assay is intended as an aid in the diagnosis of influenza from Nasopharyngeal swab specimens and should not be used as a sole basis for treatment. Nasal washings and aspirates are unacceptable for Xpert Xpress SARS-CoV-2/FLU/RSV testing.  Fact Sheet for Patients: BloggerCourse.com  Fact Sheet for Healthcare Providers: SeriousBroker.it  This test is not yet approved or cleared by the [place] and  has been authorized for detection and/or diagnosis of SARS-CoV-2 by FDA under an Emergency Use Authorization (EUA). This EUA will remain in effect (meaning this test can be used) for the duration of the COVID-19 declaration under Section 564(b)(1) of the Act, 21 U.S.C. section 360bbb-3(b)(1), unless the authorization is terminated or revoked.     Resp Syncytial Virus by PCR NEGATIVE NEGATIVE Final    Comment: (NOTE) Fact Sheet for Patients: BloggerCourse.com  Fact Sheet for Healthcare Providers: SeriousBroker.it  This test is not yet approved or cleared by the [place] FDA and has been authorized for detection and/or diagnosis of SARS-CoV-2 by FDA under an Emergency Use Authorization (EUA). This EUA will remain in effect (meaning this test can be used) for the duration of the COVID-19 declaration under Section 564(b)(1) of the Act, 21 U.S.C. section 360bbb-3(b)(1), unless the authorization is terminated or revoked.  Performed at [facility], [address] [place], [place] [address]   C Difficile Quick Screen w PCR reflex     Status: None   Collection Time: 10/08/23  1:55 PM   Specimen: STOOL  Result Value Ref Range Status   C Diff antigen NEGATIVE NEGATIVE Final   C Diff toxin NEGATIVE NEGATIVE Final   C Diff interpretation No C. difficile  detected.  Final    Comment: NEGATIVE Performed at [facility], [address] [place], [place] [address]     Antimicrobials/Microbiology: Anti-infectives (From admission, onward)    None         Component Value Date/Time   SDES BLOOD LEFT ARM 10/07/2023 1031   SPECREQUEST  10/07/2023 1031    BOTTLES DRAWN AEROBIC ONLY Blood Culture results may not be optimal due to an inadequate volume of blood received in culture bottles   CULT  10/07/2023 1031    NO GROWTH 4 DAYS Performed at [facility], [address] [place], [place] [address]    REPTSTATUS PENDING 10/07/2023 1031     Radiology Studies: No results found.   LOS: 4 days   Total time spent in review of labs and imaging, patient evaluation, formulation of plan, documentation and communication with family: 35 minutes  Lanae Boast, MD  [facility]  10/11/2023, 8:49 AM

## 2023-10-11 NOTE — Evaluation (Signed)
Physical Therapy Evaluation Patient Details Name: David [name] MRN: [id number] DOB: [birth year] Today's Date: 10/11/2023  History of Present Illness  Pt is 73 yo male admitted on 10/07/23 with metastatic cancer to bone.  Pt transferred to [facility] from [facility] for radiation oncology treatment.  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  Clinical Impression  Pt admitted with above diagnosis. AT baseline, pt resides alone, is independent, and ambulatory with cane.  Today, pt presenting with significant weakness and limited by pain.  He required max x 2 to transfer to EOB, unable to maintain balance, and in severe pain having to return to supine. He had received oxycodone about 1 hr prior to session.    Pt currently with functional limitations due to the deficits listed below (see PT Problem List). Pt will benefit from acute skilled PT to increase their independence and safety with mobility to allow discharge.  Pt well below baseline and unable to manage at home alone.  Patient will benefit from continued inpatient follow up therapy, <3 hours/day at d/c.          If plan is discharge home, recommend the following: Two people to help with walking and/or transfers;Two people to help with bathing/dressing/bathroom   Can travel by private vehicle   No    Equipment Recommendations Other (comment) (defer post acute)  Recommendations for Other Services       Functional Status Assessment Patient has had a recent decline in their functional status and demonstrates the ability to make significant improvements in function in a reasonable and predictable amount of time.     Precautions / Restrictions Precautions Precautions: Fall Precaution Comments: Pain - premedicate      Mobility  Bed Mobility Overal bed mobility: Needs Assistance Bed Mobility: Supine to Sit, Sit to Supine     Supine to sit: Max assist, +2 for physical  assistance Sit to supine: Max assist, +2 for physical assistance   General bed mobility comments: Started log roll technique but pt painful on L side (possibly try to R next visit); Required max x 2 for supine/sit    Transfers                   General transfer comment: Limited by pain unable to progress past EOB    Ambulation/Gait                  Stairs            Wheelchair Mobility     Tilt Bed    Modified Rankin (Stroke Patients Only)       Balance Overall balance assessment: Needs assistance Sitting-balance support: Bilateral upper extremity supported Sitting balance-Leahy Scale: Poor Sitting balance - Comments: Bil UE support and mod A; when UE lifted would lean/fall to that side; limited by pain                                     Pertinent Vitals/Pain Pain Assessment Pain Assessment: Faces Faces Pain Scale: Hurts worst Pain Location: L ribs, back, L shoulder Pain Descriptors / Indicators: Sharp, Grimacing Pain Intervention(s): Limited activity within patient's tolerance, Monitored during session, Repositioned    Home Living Family/patient expects to be discharged to:: Private residence Living Arrangements: Alone Available Help at Discharge: Family;Friend(s);Available PRN/intermittently Type of Home: House Home Access: Stairs to enter  Entrance Stairs-Rails: Right;Left Entrance Stairs-Number of Steps: 4 in front, 7 in back   Home Layout: One level Home Equipment: Agricultural consultant (2 wheels);Cane - single point;BSC/3in1      Prior Function               Mobility Comments: reports that he mostly uses his cane ADLs Comments: reports Ind with ADLs/selfcare, sink bathes     Extremity/Trunk Assessment   Upper Extremity Assessment Upper Extremity Assessment: Defer to OT evaluation    Lower Extremity Assessment Lower Extremity Assessment: LLE deficits/detail;RLE deficits/detail RLE Deficits / Details: Limited by  pain ; ROM WFL; MMT 3/5 knee ext, 1/5 hip , 3/5 ankle LLE Deficits / Details: Limited by pain ; ROM WFL; MMT 3/5 knee ext, 1/5 hip, 3/5 ankle    Cervical / Trunk Assessment Cervical / Trunk Assessment: Kyphotic;Other exceptions Cervical / Trunk Exceptions: severe back pain, weak trunk  Communication      Cognition Arousal: Alert Behavior During Therapy: WFL for tasks assessed/performed Overall Cognitive Status: Within Functional Limits for tasks assessed                                 General Comments: Willing to try therapy but then limited by severe pain in sitting; eases at rest        General Comments General comments (skin integrity, edema, etc.): VSS on RA but pt with increased RR with activity due to pain    Exercises     Assessment/Plan    PT Assessment Patient needs continued PT services  PT Problem List Decreased strength;Pain;Decreased range of motion;Decreased activity tolerance;Decreased balance;Decreased mobility;Decreased knowledge of use of DME       PT Treatment Interventions DME instruction;Therapeutic exercise;Gait training;Functional mobility training;Therapeutic activities;Patient/family education;Neuromuscular re-education;Balance training;Wheelchair mobility training    PT Goals (Current goals can be found in the Care Plan section)  Acute Rehab PT Goals Patient Stated Goal: decrease pain PT Goal Formulation: With patient Time For Goal Achievement: 10/25/23 Potential to Achieve Goals: Fair    Frequency Min 1X/week     Co-evaluation PT/OT/SLP Co-Evaluation/Treatment: Yes Reason for Co-Treatment: Complexity of the patient's impairments (multi-system involvement);For patient/therapist safety           AM-PAC PT "6 Clicks" Mobility  Outcome Measure Help needed turning from your back to your side while in a flat bed without using bedrails?: Total Help needed moving from lying on your back to sitting on the side of a flat bed  without using bedrails?: Total Help needed moving to and from a bed to a chair (including a wheelchair)?: Total Help needed standing up from a chair using your arms (e.g., wheelchair or bedside chair)?: Total Help needed to walk in hospital room?: Total Help needed climbing 3-5 steps with a railing? : Total 6 Click Score: 6    End of Session   Activity Tolerance: Patient limited by pain Patient left: in bed;with call bell/phone within reach;with bed alarm set;with SCD's reapplied Nurse Communication: Mobility status PT Visit Diagnosis: Other abnormalities of gait and mobility (R26.89);Muscle weakness (generalized) (M62.81)    Time: 1610-9604 PT Time Calculation (min) (ACUTE ONLY): 15 min   Charges:   PT Evaluation $PT Eval Low Complexity: 1 Low   PT General Charges $$ ACUTE PT VISIT: 1 Visit         David [name], PT Acute Rehab [facility] [phone]   David [name] 10/11/2023, 11:25 AM

## 2023-10-12 ENCOUNTER — Other Ambulatory Visit: Payer: Self-pay

## 2023-10-12 ENCOUNTER — Ambulatory Visit
Admit: 2023-10-12 | Discharge: 2023-10-12 | Disposition: A | Discharge status: Home / Self Care | Payer: Medicare PPO | Attending: Radiation Oncology | Admitting: Radiation Oncology

## 2023-10-12 DIAGNOSIS — C7951 Secondary malignant neoplasm of bone: Secondary | ICD-10-CM | POA: Diagnosis not present

## 2023-10-12 LAB — CULTURE, BLOOD (ROUTINE X 2)
Culture: NO GROWTH
Culture: NO GROWTH

## 2023-10-12 LAB — RAD ONC ARIA SESSION SUMMARY
Course Elapsed Days: 4
Plan Fractions Treated to Date: 3
Plan Fractions Treated to Date: 3
Plan Prescribed Dose Per Fraction: 3 Gy
Plan Prescribed Dose Per Fraction: 3 Gy
Plan Total Fractions Prescribed: 10
Plan Total Fractions Prescribed: 10
Plan Total Prescribed Dose: 30 Gy
Plan Total Prescribed Dose: 30 Gy
Reference Point Dosage Given to Date: 9 Gy
Reference Point Dosage Given to Date: 9 Gy
Reference Point Session Dosage Given: 3 Gy
Reference Point Session Dosage Given: 3 Gy
Session Number: 3

## 2023-10-12 LAB — GLUCOSE, CAPILLARY
Glucose-Capillary: 119 mg/dL — ABNORMAL HIGH (ref 70–99)
Glucose-Capillary: 123 mg/dL — ABNORMAL HIGH (ref 70–99)
Glucose-Capillary: 140 mg/dL — ABNORMAL HIGH (ref 70–99)

## 2023-10-12 NOTE — TOC CM/SW Note (Signed)
CMS list of facilities and star ratings provided to pt to review for facility preference.       [facility] [address] [place], [place] [address] [phone] Overall rating ??  Below average  [facility] at [facility] [address] [place], [place] [address] [phone] Overall rating ? Below average  [facility] [address] [place], [place] [address] [phone] Overall rating? Below average  [facility] [address] [place], [place] [address] [phone] Overall rating ? Much below average  [facility] [address] [place], [place] [address] [phone] Overall rating ???? Average  [facility] [address] [place], [place] [address] [phone] Overall rating ? Much below average   [facility] [address] [place], [place] [address] [phone] Overall rating ?? Much below average  [facility] [address] [place], [place] [address] [phone] Overall rating ??? Average  [facility] [address] [place], [place] [address] [phone] Overall rating ? Much below average  [facility] [address] [place], [place] [address] [phone] Overall rating ?? Much below average  [facility] [address] [place], [place] [address] [phone] Overall rating ??? Above average  [facility] [address] [place], [place] [address] [phone] Overall rating ????? Much above average  [facility] [address] [place], [place] [address] [phone] Overall rating ???? Above average  [facility] [address] [place], [place]  [address] [phone] Overall rating ????? Much above average  [facility] [address] [place], [place] [address] [phone] Overall rating ????  [facility] [address] [place], [place] [address] [phone] Overall rating ???? Much above average  [place] [facility] [address] [place], [place] [address] [phone] Overall rating ????? Much above average  [facility] [address] [place], [place] [address] [phone] Overall rating ? Much below average  [facility] [address] [place], [place] [address] [phone] Overall rating ??? Average  [facility] [address] [place], [place] [address] [phone] Overall rating ????? Much above average  [facility] [address] [place], [place] [address] [phone] Overall rating ??? Average  [facility] [address] [place], [place] [address] [phone] Overall rating ?? Below average  [facility] [address] [place], [place] [address] [phone] Overall rating ??? Average  [facility] [address] [place], [place] [address] [phone] Overall rating ????? Much above average  [facility] [address] [place], [place] [address] [phone] Overall rating ? Much below average  [facility]	[place] [facility] [place], [place] [address] [phone] Overall rating ????? Much above average  [facility] [address] [place], [place] [address] [phone] Overall rating ??? Much below average  [facility] [address] [place], [place] [address] [phone] Overall rating ??? Average  [facility]  [facility] [address] [place], [place] [address] [phone] Overall rating ??  Below average  [facility] [address] [place], [place] [address] [phone] Overall rating ? Much below average  [facility] [address] [place], [place] [address] [phone] Overall rating ??? Average  [facility] [address] [place], [place] [address] [phone] Overall rating ? Much below average  [facility] [address] [place], [place] [address] [phone] Overall rating ??? Average  [facility] [address] [place], [place] [address] [phone] Overall rating ? Much below average  [facility] [address] [place], [place] [address] [phone] Overall rating ????? Much above average  [facility] [address] [place], [place] [address] [phone] Overall rating ? Much below average  [facility] [place] [address] [place], [place] [address] [phone] Overall rating ???? Above average  [facility] [address] [place], [place] [address] [phone] Overall rating ????? Above average  [facility] [address] [place], [place] [address] [phone] Overall rating ???? Above average  [facility] [address] [place], [place] [address] [phone] Overall rating ????? Much above average  [facility] [address] [place], [place] [address] [phone] Overall rating ? Much below average  [facility] [address] [place], [place] [address] [phone] Overall rating ????? Much above average  [facility] [address] [place], [place] [address] [phone] Overall rating ? Much below  average  [facility] [address] [place], [place] [address] [phone] Overall rating ????? Much above average  [facility] [address] [place], [place] [address] [phone] Overall rating ????? Much above average  [facility] [address] [place], [place] [address] [phone] Overall rating ??? Average  [facility] [address] [place], [place] [address] [phone] Overall rating ??? Average  [facility] [address] [place], [place] [address] [phone] Overall rating ???? Above average  [facility] [address] [place], [place] [address] [phone] Overall rating ?? Below average  [facility] [address] [place], [place] [address] [phone] Overall rating ???? Above average  [facility] [address] [place], [place] [address] [phone] Overall rating ?? Below average  [facility] [address] [place], [place] [address] [phone] Overall rating ??

## 2023-10-12 NOTE — Progress Notes (Signed)
Daily Progress Note   Patient Name: David [name]       Date: 10/12/2023 DOB: 1951/01/30  Age: 73 y.o. MRN#: [id number] Attending Physician: David Boast, MD Primary Care Physician: David Found, MD Admit Date: 10/07/2023  Reason for Consultation/Follow-up: Establishing goals of care  Subjective:  Awake alert,resting in bed, denies pain at present.   Discussed with patient about using IV and PO PRN opioids for pain. Used only one dose of 5 mg PO Oxy IR in the past 24 hours.   Length of Stay: 5  Current Medications: Scheduled Meds:  . amLODipine  10 mg Oral Daily  . Chlorhexidine Gluconate Cloth  6 each Topical Daily  . heparin  5,000 Units Subcutaneous Q8H  . lactose free nutrition  237 mL Oral BID BM  . methylPREDNISolone (SOLU-MEDROL) injection  40 mg Intravenous Q12H  . mirtazapine  7.5 mg Oral QHS  . senna-docusate  2 tablet Oral QHS  . sodium chloride flush  10-40 mL Intracatheter Q12H  . sodium chloride flush  3 mL Intravenous Q12H  . sodium chloride flush  3 mL Intravenous Q12H  . tamsulosin  0.4 mg Oral Daily    Continuous Infusions: . sodium chloride      PRN Meds: acetaminophen **OR** acetaminophen, bisacodyl, dextromethorphan-guaiFENesin, HYDROmorphone (DILAUDID) injection, labetalol, ondansetron **OR** ondansetron (ZOFRAN) IV, oxyCODONE, sodium chloride flush, sodium chloride flush  Physical Exam         Awake alert Appears chronically ill No distress no abdominal tenderness No edema Oriented  Vital Signs: BP 138/72 (BP Location: Left Arm)   Pulse 68   Temp (!) 97.3 F (36.3 C) (Oral)   Resp 15   Ht 5\' 11"  (1.803 m)   Wt 89.8 kg   SpO2 100%   BMI 27.61 kg/m  SpO2: SpO2: 100 % O2 Device: O2 Device: Room Air O2 Flow Rate: O2 Flow Rate (L/min): 2  L/min  Intake/output summary:  Intake/Output Summary (Last 24 hours) at 10/12/2023 8295 Last data filed at 10/11/2023 2000 Gross per 24 hour  Intake --  Output 900 ml  Net -900 ml   LBM: Last BM Date : 10/11/23 Baseline Weight: Weight: 89.8 kg Most recent weight: Weight: 89.8 kg       Palliative Assessment/Data:      Patient Active Problem List  Diagnosis Date Noted  . Metastatic cancer to bone (HCC) 10/07/2023  . Diarrhea in adult patient 10/07/2023  . Clear cell renal cell carcinoma, left (HCC) 01/12/2023  . Abdominal adhesions 08/13/2022  . AKI (acute kidney injury) (HCC) 07/23/2022  . Renal mass 07/23/2022  . Lung nodule 07/23/2022  . Urinary retention 07/23/2022  . BPH (benign prostatic hyperplasia) 07/23/2022  . Hepatitis C virus infection cured after antiviral drug therapy 07/13/2019  . Elevated LFTs 07/13/2019  . Seizure disorder (HCC) 01/14/2012  . 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    Palliative Care Assessment & Plan   Patient Profile:    Assessment:  73 year old gentleman originally from [place], [place].  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  Seen and evaluated by radiation oncology after having [facility].  Plan is for the patient to receive 30 GY in 10 fractions to 2 spinal sites T7-T8 as well as T12. Palliative consult for ongoing goals of care discussions has been requested  Recommendations/Plan:   Continue current pain and non pain symptom management regimen and monitor. Encouraged patient to ask for PRN opioids for pain.  Full Code Full Scope care Only relative and next of kin is sister David [name] from [place], [place]. I called her and updated her about the patient's current condition, TOC is also in touch with her regarding SNF rehab with palliative. Patient [facility] [place] [facility] palliative.    Code  Status:    Code Status Orders  (From admission, onward)           Start     Ordered   10/07/23 1559  Full code  Continuous       Question:  By:  Answer:  Consent: discussion documented in EHR   10/07/23 1559           Code Status History     Date Active Date Inactive Code Status Order ID Comments User Context   08/13/2022 1509 08/16/2022 2240 Full Code [id number]  David Gauze, MD Inpatient   07/23/2022 2055 07/24/2022 1729 Full Code [id number]  David [name], Leana Roe, MD Inpatient       Prognosis:  Unable to determine  Discharge Planning: Skilled Nursing Facility for rehab with Palliative care service follow-up  Care plan was discussed with  patient and also with sister David [name] on phone.   Thank you for allowing the Palliative Medicine Team to assist in the care of this patient.  Mod MDM     Greater than 50%  of this time was spent counseling and coordinating care related to the above assessment and plan.  David Hawking, MD  Please contact Palliative Medicine Team phone at [phone] for questions and concerns.

## 2023-10-12 NOTE — TOC Progression Note (Signed)
Transition of Care [facility]) - Progression Note    Patient Details  Name: David [name] MRN: [id number] Date of Birth: 02/07/1951  [facility] CM/SW Contact  Otelia Santee, LCSW Phone Number: 10/12/2023, 2:30 PM  Clinical Narrative:    Reviewed bed offer with pt in person and sister via t/c. Pt has accepted bed offer for [facility]. Insurance auth requested and approved from 1/28 to 1/30: Ins auth ID: [id number]. Pt able to transfer to facility once bed ready at facility.    Expected Discharge Plan:  (TBD) Barriers to Discharge: Continued Medical Work up  Expected Discharge Plan and Services       Living arrangements for the past 2 months: Single Family Home                                       Social Determinants of Health (SDOH) Interventions SDOH Screenings   Food Insecurity: No Food Insecurity (10/07/2023)  Housing: Low Risk  (10/07/2023)  Transportation Needs: No Transportation Needs (10/07/2023)  Utilities: Not At Risk (10/07/2023)  Depression (PHQ2-9): Low Risk  (01/12/2023)  Financial Resource Strain: Low Risk  (03/24/2023)   [facility]  Recent Concern: Financial Resource Strain - High Risk (02/01/2023)  Social Connections: Unknown (03/07/2023)   [facility]  Stress: Patient Declined (03/07/2023)   [facility]  Tobacco Use: High Risk (10/09/2023)    Readmission Risk Interventions     No data to display

## 2023-10-12 NOTE — Progress Notes (Signed)
PROGRESS NOTE David [name]  ZOX:[id number] DOB: May 11, 1951 DOA: 10/07/2023 PCP: Assunta Found, MD  Brief Narrative/Hospital Course:  73 y.o. male smoker with past medical history relevant for history of hep C previously treated with antivirals, HTN, and  Metastatic left kidney clear-cell RCC, s/p Left robotic assisted laparoscopic radical nephrectomy by Dr. Ronne Binning on 08/13/2022 with Pathology: at that time showing 7 cm clear-cell RCC with focal sarcomatoid and rhabdoid features, nuclear grade 4.  Tumor extended into the renal vein and renal sinus fat. PET scan (12/24/2022): Enlarging hypermetabolic left adrenal metastasis.  2 small hypermetabolic soft tissue nodules in the left nephrectomy bed.  Hypermetabolic lytic lesions involving distal left clavicle and left temporal bone.  Indeterminate small hypermetabolic parotid nodules bilaterally. 1/23>Transferred from [facility] for radiation oncology treatment and started on froday with plan for 10 rx last being on next Thursday. Palliative care following. At this time plan for SNF once bed available at [facility].   Subjective: Resting comfortably no bowel movement yesterday diarrhea has resolved  Complains of pain with movement awaiting for radiation therapy this morning   Assessment and Plan: Principal Problem:   Metastatic cancer to bone [facility]) Active Problems:   Clear cell renal cell carcinoma, left (HCC)   Diarrhea in adult patient   Seizure disorder (HCC)   AKI (acute kidney injury) (HCC)   Metastatic left kidney RCC Status post left robotic assisted laparoscopic radical nephrectomy 07/27/2022: Patient is followed by oncology at any pain and has received different regimen including lenvatinib Keytruda car bozantinib-see previous note  CTA Chest 1/23>severe Thoracic Spinal Metastatic Disease with marked progression since an August PET-CT with four levels affected (T7, T8, T11, T12) pathologic vertebral fractures, destruction of the  posterior right 8th rib, and tumor extension into the spinal canal. Associated prevertebral lymph node metastases.Multifocal liver metastases, new since 2023 and individually up to 2.3 cm.Trace layering pleural effusions. No pulmonary embolus identified. Transferred from [facility] to [facility] for XRT s/p 1st XRT 1/24 > plan is to continue 10 days of radiation therapy which he can follow-up as outpatient.  Continue pain control steroid palliative care following, awaiting for skilled nursing facility placement at this time.    Dehydration/lactic acidosis: Resolved.  In the setting of dehydration and metastatic cancer. No leukocytosis no evidence of UTI no fever chills CTA chest without evidence of acute infection and no evidence of sepsis at this time.    Acute diarrhea: Has been having diarrhea in the setting of his chemotherapy History of COVID-negative C. difficile antigen and toxin.  Diarrhea improved with steroid  AKI: Due to diarrhea, resolved.  Discontinued IV fluids  Hypertension: BP stable, cont amlodipine 10 mg.  Continue Flomax Continue Remeron at bedtime  Unintentional weight loss Nutrition Problem: Unintentional weight loss Etiology: cancer and cancer related treatments (renal cell cancer on chemotherapy) Signs/Symptoms: percent weight loss (20.5% in 8 months) Percent weight loss: 20.5 % (in 8 months) Interventions: Boost Plus, Refer to RD note for recommendations    DVT prophylaxis: heparin injection 5,000 Units Start: 10/07/23 2200 SCDs Start: 10/07/23 1559 Place TED hose Start: 10/07/23 1559 Code Status:   Code Status: Full Code Family Communication: plan of care discussed with patient at bedside. Patient status is: Remains hospitalized because of severity of illness Level of care: Med-Surg   Dispo: The patient is from: home            Anticipated disposition: Medically stable.  Awaiting home SNF  Objective: Vitals last 24 hrs: Vitals:  10/11/23 1435 10/11/23 1958 10/12/23  0449 10/12/23 1126  BP: (!) 140/82 (!) 116/58 138/72 126/69  Pulse: (!) 120 64 68   Resp: 20 18 15 18   Temp: (!) 97.3 F (36.3 C) 97.6 F (36.4 C) (!) 97.3 F (36.3 C) 98 F (36.7 C)  TempSrc:  Oral Oral Oral  SpO2: 100% 100% 100% 100%  Weight:      Height:       Weight change:   Physical Examination: General exam: alert awake, oriented at baseline, older than stated age HEENT:Oral mucosa moist, Ear/Nose WNL grossly Respiratory system: Bilaterally clear BS,no use of accessory muscle Cardiovascular system: S1 & S2 +, No JVD. Gastrointestinal system: Abdomen soft,NT,ND, BS+ Nervous System: Alert, awake, moving all extremities,and following commands. Extremities: LE edema neg,distal peripheral pulses palpable and warm.  Skin: No rashes,no icterus. MSK: Normal muscle bulk,tone, power   Medications reviewed:  Scheduled Meds:  amLODipine  10 mg Oral Daily   Chlorhexidine Gluconate Cloth  6 each Topical Daily   heparin  5,000 Units Subcutaneous Q8H   lactose free nutrition  237 mL Oral BID BM   methylPREDNISolone (SOLU-MEDROL) injection  40 mg Intravenous Q12H   mirtazapine  7.5 mg Oral QHS   senna-docusate  2 tablet Oral QHS   sodium chloride flush  10-40 mL Intracatheter Q12H   sodium chloride flush  3 mL Intravenous Q12H   sodium chloride flush  3 mL Intravenous Q12H   tamsulosin  0.4 mg Oral Daily   Continuous Infusions:  sodium chloride        Diet Order             Diet regular Room service appropriate? Yes; Fluid consistency: Thin  Diet effective now                   Intake/Output Summary (Last 24 hours) at 10/12/2023 1307 Last data filed at 10/11/2023 2000 Gross per 24 hour  Intake --  Output 500 ml  Net -500 ml   Net IO Since Admission: -3,960.07 mL [10/12/23 1307]  Wt Readings from Last 3 Encounters:  10/07/23 89.8 kg  09/30/23 89.8 kg  09/06/23 89.8 kg     Unresulted Labs (From admission, onward)    None      Data Reviewed: I have  personally reviewed following labs and imaging studies CBC: Recent Labs  Lab 10/07/23 0938 10/07/23 1026 10/09/23 0453 10/10/23 0336  WBC 7.5 7.3 4.6 3.6*  NEUTROABS 3.0 2.9  --   --   HGB 11.9* 11.8* 8.9* 8.2*  HCT 39.5 36.6* 27.3* 25.1*  MCV 107.0* 103.4* 101.5* 102.9*  PLT 220 226 PLATELET CLUMPS NOTED ON SMEAR, UNABLE TO ESTIMATE PLATELET CLUMPS NOTED ON SMEAR, COUNT APPEARS ADEQUATE   Basic Metabolic Panel:  Recent Labs  Lab 10/07/23 0938 10/07/23 1026 10/08/23 1130 10/09/23 0453 10/10/23 0336  NA 140 140 140 140 138  K 4.3 4.3 3.7 4.1 4.0  CL 107 106 109 108 110  CO2 13* 15* 20* 22 20*  GLUCOSE 144* 144* 144* 106* 135*  BUN 25* 25* 20 23 23   CREATININE 1.34* 1.40* 0.90 0.80 0.67  CALCIUM 9.5 9.5 8.1* 8.2* 7.5*  MG 2.2  --   --   --   --    GFR: Estimated Creatinine Clearance: 90.2 mL/min (by C-G formula based on SCr of 0.67 mg/dL). Liver Function Tests:  Recent Labs  Lab 10/07/23 0938 10/07/23 1026  AST 35 35  ALT 30 29  ALKPHOS  119 121  BILITOT 1.1 1.3*  PROT 7.8 7.8  ALBUMIN 4.0 3.9  Sepsis Labs: Recent Labs  Lab 10/07/23 1026 10/07/23 1211 10/07/23 1819  LATICACIDVEN >9.0* 3.6* 2.2*  2.2*   Recent Results (from the past 240 hours)  Culture, blood (Routine x 2)     Status: None   Collection Time: 10/07/23 10:26 AM   Specimen: BLOOD  Result Value Ref Range Status   Specimen Description BLOOD LEFT ASSIST CONTROL  Final   Special Requests   Final    BOTTLES DRAWN AEROBIC AND ANAEROBIC Blood Culture results may not be optimal due to an inadequate volume of blood received in culture bottles   Culture   Final    NO GROWTH 5 DAYS Performed at [facility], [address] [place], [place] [address]    Report Status 10/12/2023 FINAL  Final  Culture, blood (Routine x 2)     Status: None   Collection Time: 10/07/23 10:31 AM   Specimen: BLOOD  Result Value Ref Range Status   Specimen Description BLOOD LEFT ARM  Final   Special Requests   Final     BOTTLES DRAWN AEROBIC ONLY Blood Culture results may not be optimal due to an inadequate volume of blood received in culture bottles   Culture   Final    NO GROWTH 5 DAYS Performed at [facility], [address], [place], [place] [address]    Report Status 10/12/2023 FINAL  Final  Resp panel by RT-PCR (RSV, Flu A&B, Covid) Anterior Nasal Swab     Status: None   Collection Time: 10/07/23 12:06 PM   Specimen: Anterior Nasal Swab  Result Value Ref Range Status   SARS Coronavirus 2 by RT PCR NEGATIVE NEGATIVE Final    Comment: (NOTE) SARS-CoV-2 target nucleic acids are NOT DETECTED.  The SARS-CoV-2 RNA is generally detectable in upper respiratory specimens during the acute phase of infection. The lowest concentration of SARS-CoV-2 viral copies this assay can detect is 138 copies/mL. A negative result does not preclude SARS-Cov-2 infection and should not be used as the sole basis for treatment or other patient management decisions. A negative result may occur with  improper specimen collection/handling, submission of specimen other than nasopharyngeal swab, presence of viral mutation(s) within the areas targeted by this assay, and inadequate number of viral copies(<138 copies/mL). A negative result must be combined with clinical observations, patient history, and epidemiological information. The expected result is Negative.  Fact Sheet for Patients:  BloggerCourse.com  Fact Sheet for Healthcare Providers:  SeriousBroker.it  This test is no t yet approved or cleared by the [place] FDA and  has been authorized for detection and/or diagnosis of SARS-CoV-2 by FDA under an Emergency Use Authorization (EUA). This EUA will remain  in effect (meaning this test can be used) for the duration of the COVID-19 declaration under Section 564(b)(1) of the Act, 21 U.S.C.section 360bbb-3(b)(1), unless the authorization is terminated  or revoked  sooner.       Influenza A by PCR NEGATIVE NEGATIVE Final   Influenza B by PCR NEGATIVE NEGATIVE Final    Comment: (NOTE) The Xpert Xpress SARS-CoV-2/FLU/RSV plus assay is intended as an aid in the diagnosis of influenza from Nasopharyngeal swab specimens and should not be used as a sole basis for treatment. Nasal washings and aspirates are unacceptable for Xpert Xpress SARS-CoV-2/FLU/RSV testing.  Fact Sheet for Patients: BloggerCourse.com  Fact Sheet for Healthcare Providers: SeriousBroker.it  This test is not yet approved or cleared  by the [place] and has been authorized for detection and/or diagnosis of SARS-CoV-2 by FDA under an Emergency Use Authorization (EUA). This EUA will remain in effect (meaning this test can be used) for the duration of the COVID-19 declaration under Section 564(b)(1) of the Act, 21 U.S.C. section 360bbb-3(b)(1), unless the authorization is terminated or revoked.     Resp Syncytial Virus by PCR NEGATIVE NEGATIVE Final    Comment: (NOTE) Fact Sheet for Patients: BloggerCourse.com  Fact Sheet for Healthcare Providers: SeriousBroker.it  This test is not yet approved or cleared by the [place] FDA and has been authorized for detection and/or diagnosis of SARS-CoV-2 by FDA under an Emergency Use Authorization (EUA). This EUA will remain in effect (meaning this test can be used) for the duration of the COVID-19 declaration under Section 564(b)(1) of the Act, 21 U.S.C. section 360bbb-3(b)(1), unless the authorization is terminated or revoked.  Performed at [facility], [address], [place], [place] [address]   C Difficile Quick Screen w PCR reflex     Status: None   Collection Time: 10/08/23  1:55 PM   Specimen: STOOL  Result Value Ref Range Status   C Diff antigen NEGATIVE NEGATIVE Final   C Diff toxin NEGATIVE NEGATIVE Final    C Diff interpretation No C. difficile detected.  Final    Comment: NEGATIVE Performed at [facility], [address], [place], [place] [address]     Antimicrobials/Microbiology: Anti-infectives (From admission, onward)    None         Component Value Date/Time   SDES BLOOD LEFT ARM 10/07/2023 1031   SPECREQUEST  10/07/2023 1031    BOTTLES DRAWN AEROBIC ONLY Blood Culture results may not be optimal due to an inadequate volume of blood received in culture bottles   CULT  10/07/2023 1031    NO GROWTH 5 DAYS Performed at [facility], [address], [place], [place] [address]    REPTSTATUS 10/12/2023 FINAL 10/07/2023 1031     Radiology Studies: No results found.   LOS: 5 days   Total time spent in review of labs and imaging, patient evaluation, formulation of plan, documentation and communication with family: 35 minutes  Lanae Boast, MD  [facility]  10/12/2023, 1:07 PM

## 2023-10-13 ENCOUNTER — Ambulatory Visit
Admit: 2023-10-13 | Discharge: 2023-10-13 | Disposition: A | Discharge status: Home / Self Care | Payer: Medicare PPO | Attending: Radiation Oncology | Admitting: Radiation Oncology

## 2023-10-13 ENCOUNTER — Other Ambulatory Visit: Payer: Self-pay

## 2023-10-13 DIAGNOSIS — C7951 Secondary malignant neoplasm of bone: Secondary | ICD-10-CM | POA: Diagnosis not present

## 2023-10-13 LAB — RAD ONC ARIA SESSION SUMMARY

## 2023-10-13 LAB — GLUCOSE, CAPILLARY: Glucose-Capillary: 105 mg/dL — ABNORMAL HIGH (ref 70–99)

## 2023-10-13 NOTE — Progress Notes (Incomplete)
[facility] [address], [place] [address]    Clinic Day:  10/13/2023  Referring physician: Assunta Found, MD  Patient Care Team: Assunta Found, MD as PCP - General (Family Medicine) Doreatha Massed, MD as Medical Oncologist (Medical Oncology) Therese Sarah, RN as Oncology Nurse Navigator (Medical Oncology)   ASSESSMENT & PLAN:   Assessment: 1.  Metastatic left kidney clear-cell RCC: - Presentation with left shoulder pain.  Weight loss of 50 pounds in the last 6 months but was trying to lose weight by cutting back on eating and doing some walking. - Left robotic assisted laparoscopic radical nephrectomy by Dr. Ronne Binning on 08/13/2022 - Pathology: 7 cm clear-cell RCC with focal sarcomatoid and rhabdoid features, nuclear grade 4.  Tumor extends into the renal vein and renal sinus fat (PT3a).  Ureteral, vascular and all margins negative for tumor. - PET scan (12/24/2022): Enlarging hypermetabolic left adrenal metastasis.  2 small hypermetabolic soft tissue nodules in the left nephrectomy bed.  Hypermetabolic lytic lesions involving distal left clavicle and left temporal bone.  Indeterminate small hypermetabolic parotid nodules bilaterally. - IMDC: At least intermediate pending labs - Lenvatinib 14 mg daily started on 01/18/2023, Keytruda on 01/22/2023, dose decreased to 10 mg on 01/28/2023 due to elevated blood pressure, discontinued due to progression on PET scan on 05/06/2023 - Cabozantinib 40 mg daily started on 06/24/2023   2.  Social/family history: - Lives by himself and is independent of ADLs and IADLs.  He does not drive.  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  Current active smoker, half pack per day, started at age 56. - Mother might have had cancer, he is not sure.    Plan: 1.  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 - Cabozantinib was started on 06/24/2023. - He has occasional diarrhea.  No other significant side effects. - Reviewed labs today: Normal LFTs with albumin 3.4.  Electrolytes are normal.  CBC was grossly normal with normocytic anemia, hemoglobin 10.6. - Recommend continuing cabozantinib 40 mg daily.  RTC 4 weeks for follow-up.  Will plan on repeating PET scan prior to next visit.   2.  Hypertension: - Continue amlodipine 10 mg daily.  Lisinopril is on hold.  Blood pressure today is 116/74.   3.  Epidermal necrolysis: - He has an ulcer along the left lateral waistline and 2 shallow ulcers in the upper part of the gluteal cleft. - Continue follow-up with wound care.  4.  Weight loss: - He reports decrease in appetite.  He lost about 20 pounds since October.  He is eating 1-2 meals per day. - Recommend starting boost high-protein 1 can per day. - Will start him on Megace 400 mg twice daily.    No orders of the defined types were placed in this encounter.    Alben Deeds Teague,acting as a Neurosurgeon for Doreatha Massed, MD.,have documented all relevant documentation on the behalf of Doreatha Massed, MD,as directed by  Doreatha Massed, MD while in the presence of Doreatha Massed, MD.  ***   [employer] R Teague   1/29/20258:51 AM  CHIEF COMPLAINT:   Diagnosis: metastatic clear cell renal cell carcinoma    Cancer Staging  Clear cell renal cell carcinoma, left [facility]) Staging form: Kidney, AJCC 8th Edition - Clinical stage from 01/12/2023: Stage IV (cT3a, cNX, cM1) - Unsigned  Prior Therapy:  1. left nephrectomy 08/13/22  2. Lenvatinib and pembrolizumab, 01/18/23 - 06/21/23  Current Therapy:  cabozantinib    HISTORY OF PRESENT ILLNESS:   Oncology History  Clear cell renal cell carcinoma, left (HCC)  01/12/2023 Initial Diagnosis   Clear cell renal cell carcinoma, left (HCC)   01/22/2023 -  Chemotherapy   Patient is on Treatment  Plan : KIDNEY Pembrolizumab (200) q21d        INTERVAL HISTORY:   Savalas is a 73 y.o. male seen for follow-up of metastatic clear-cell renal cell carcinoma.  He was last seen by me on 09/06/23.  Since his last visit, he was admitted to the hospital from 10/07/23 to *** for metastatic renal cell carcinoma. CTA chest showed: severe thoracic spinal metastatic disease with marked progression with four levels affected (T7, T8, T11, T12) pathologic vertebral fractures, destruction of the posterior right 8th rib, and tumor extension into the spinal canal. Associated prevertebral lymph node metastases and multifocal liver metastases. [facility]RT, started on 1/24 with 10 days of XRT after. He has been referred to palliative care.   Today, he states that he is doing well overall. His appetite level is at ***%. His energy level is at ***%.   PAST MEDICAL HISTORY:   Past Medical History: Past Medical History:  Diagnosis Date   Cancer (HCC)    Chronic kidney disease    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 Ronal Fear note   Pneumonia    Seizure [facility])     Surgical History: Past Surgical History:  Procedure Laterality Date   COLONOSCOPY  05/31/2012   Procedure: COLONOSCOPY;  Surgeon: Dalia Heading, MD;  Location: [facility] ENDO SUITE;  Service: Gastroenterology;  Laterality: N/A;   PORTACATH PLACEMENT Right 01/27/2023   Procedure: INSERTION PORT-A-CATH, RIJ;  Surgeon: Lewie Chamber, DO;  Location: [facility] ORS;  Service: General;  Laterality: Right;   ROBOT ASSISTED LAPAROSCOPIC NEPHRECTOMY Left 08/13/2022   Procedure: XI ROBOTIC ASSISTED LAPAROSCOPIC NEPHRECTOMY;  Surgeon: Malen Gauze, MD;  Location: [facility] ORS;  Service: Urology;  Laterality: Left;   TONSILLECTOMY      Social History: Social History   Socioeconomic History   Marital status: Divorced    Spouse name: Not on file   Number  of children: Not on file   Years of education: Not on file   Highest education level: Not on file  Occupational History   Not on file  Tobacco Use   Smoking status: Every Day    Current packs/day: 0.50    Average packs/day: 0.5 packs/day for 65.0 years (32.5 ttl pk-yrs)    Types: Cigarettes   Smokeless tobacco: Never  Vaping Use   Vaping status: Never Used  Substance and Sexual Activity   Alcohol use: No   Drug use: No   Sexual activity: Not Currently  Other Topics Concern   Not on file  Social History Narrative   Not on file   Social Drivers of Health   Financial Resource Strain: Low Risk  (03/24/2023)   Received from [facility]   Overall Financial Resource Strain (CARDIA)    Difficulty of Paying Living Expenses: Not hard at all  Recent Concern: Financial Resource Strain - High Risk (02/01/2023)   Overall Financial Resource Strain (CARDIA)    Difficulty of Paying Living Expenses: Very hard  Food Insecurity: No Food  Insecurity (10/07/2023)   Hunger Vital Sign    Worried About Running Out of Food in the Last Year: Never true    Ran Out of Food in the Last Year: Never true  Transportation Needs: No Transportation Needs (10/07/2023)   PRAPARE - Administrator, Civil Service (Medical): No    Lack of Transportation (Non-Medical): No  Physical Activity: Not on file  Stress: Patient Declined (03/07/2023)   Received from [facility] of Occupational Health - Occupational Stress Questionnaire    Feeling of Stress : Patient declined  Social Connections: Unknown (03/07/2023)   Received from [facility]   Social Network    Social Network: Not on file  Intimate Partner Violence: Not At Risk (10/07/2023)   Humiliation, Afraid, Rape, and Kick questionnaire    Fear of Current or Ex-Partner: No    Emotionally Abused: No    Physically Abused: No    Sexually Abused: No    Family History: No family history on  file.  Current Medications: No current facility-administered medications for this visit. No current outpatient medications on file.  Facility-Administered Medications Ordered in Other Visits:    acetaminophen (TYLENOL) tablet 650 mg, 650 mg, Oral, Q6H PRN **OR** acetaminophen (TYLENOL) suppository 650 mg, 650 mg, Rectal, Q6H PRN, Emokpae, Courage, MD   amLODipine (NORVASC) tablet 10 mg, 10 mg, Oral, Daily, Emokpae, Courage, MD, 10 mg at 10/12/23 0810   bisacodyl (DULCOLAX) suppository 10 mg, 10 mg, Rectal, Daily PRN, Mariea Clonts, Courage, MD   Chlorhexidine Gluconate Cloth 2 % PADS 6 each, 6 each, Topical, Daily, Emokpae, Courage, MD, 6 each at 10/12/23 0811   dextromethorphan-guaiFENesin (MUCINEX DM) 30-600 MG per 12 hr tablet 1 tablet, 1 tablet, Oral, BID PRN, Anthoney Harada, NP, 1 tablet at 10/13/23 0645   heparin injection 5,000 Units, 5,000 Units, Subcutaneous, Q8H, Emokpae, Courage, MD, 5,000 Units at 10/13/23 0626   HYDROmorphone (DILAUDID) injection 0.5 mg, 0.5 mg, Intravenous, Q4H PRN, Emokpae, Courage, MD   labetalol (NORMODYNE) injection 10 mg, 10 mg, Intravenous, Q4H PRN, Emokpae, Courage, MD   lactose free nutrition (BOOST PLUS) liquid 237 mL, 237 mL, Oral, BID BM, Rosezetta Schlatter T, MD, 237 mL at 10/12/23 2956   methylPREDNISolone sodium succinate (SOLU-MEDROL) 40 mg/mL injection 40 mg, 40 mg, Intravenous, Q12H, Emokpae, Courage, MD, 40 mg at 10/12/23 2016   mirtazapine (REMERON) tablet 7.5 mg, 7.5 mg, Oral, QHS, Emokpae, Courage, MD, 7.5 mg at 10/12/23 2210   ondansetron (ZOFRAN) tablet 4 mg, 4 mg, Oral, Q6H PRN **OR** ondansetron (ZOFRAN) injection 4 mg, 4 mg, Intravenous, Q6H PRN, Emokpae, Courage, MD   oxyCODONE (Oxy IR/ROXICODONE) immediate release tablet 5 mg, 5 mg, Oral, Q4H PRN, Mariea Clonts, Courage, MD, 5 mg at 10/13/23 0645   senna-docusate (Senokot-S) tablet 2 tablet, 2 tablet, Oral, QHS, Emokpae, Courage, MD, 2 tablet at 10/12/23 2210   sodium chloride 0.9 % bolus 1,000 mL, 1,000  mL, Intravenous, Once, Emokpae, Courage, MD   sodium chloride flush (NS) 0.9 % injection 10-40 mL, 10-40 mL, Intracatheter, Q12H, Emokpae, Courage, MD, 10 mL at 10/12/23 2213   sodium chloride flush (NS) 0.9 % injection 10-40 mL, 10-40 mL, Intracatheter, PRN, Emokpae, Courage, MD   sodium chloride flush (NS) 0.9 % injection 3 mL, 3 mL, Intravenous, Q12H, Emokpae, Courage, MD, 3 mL at 10/12/23 2213   sodium chloride flush (NS) 0.9 % injection 3 mL, 3 mL, Intravenous, Q12H, Emokpae, Courage, MD, 3 mL at 10/12/23 2213  sodium chloride flush (NS) 0.9 % injection 3 mL, 3 mL, Intravenous, PRN, Emokpae, Courage, MD   tamsulosin (FLOMAX) capsule 0.4 mg, 0.4 mg, Oral, Daily, Emokpae, Courage, MD, 0.4 mg at 10/12/23 0810   Allergies: No Known Allergies  REVIEW OF SYSTEMS:   Review of Systems  Constitutional:  Negative for chills, fatigue and fever.  HENT:   Negative for lump/mass, mouth sores, nosebleeds, sore throat and trouble swallowing.   Eyes:  Negative for eye problems.  Respiratory:  Negative for cough and shortness of breath.   Cardiovascular:  Negative for chest pain, leg swelling and palpitations.  Gastrointestinal:  Negative for abdominal pain, constipation, diarrhea, nausea and vomiting.  Genitourinary:  Negative for bladder incontinence, difficulty urinating, dysuria, frequency, hematuria and nocturia.   Musculoskeletal:  Negative for arthralgias, back pain, flank pain, myalgias and neck pain.  Skin:  Negative for itching and rash.  Neurological:  Negative for dizziness, headaches and numbness.  Hematological:  Does not bruise/bleed easily.  Psychiatric/Behavioral:  Negative for depression, sleep disturbance and suicidal ideas. The patient is not nervous/anxious.   All other systems reviewed and are negative.    VITALS:   There were no vitals taken for this visit.  Wt Readings from Last 3 Encounters:  10/07/23 197 lb 15.6 oz (89.8 kg)  09/30/23 197 lb 15.6 oz (89.8 kg)   09/06/23 198 lb (89.8 kg)    There is no height or weight on file to calculate BMI.  Performance status (ECOG): 1 - Symptomatic but completely ambulatory  PHYSICAL EXAM:   Physical Exam Vitals and nursing note reviewed. Exam conducted with a chaperone present.  Constitutional:      Appearance: Normal appearance.  Cardiovascular:     Rate and Rhythm: Normal rate and regular rhythm.     Pulses: Normal pulses.     Heart sounds: Normal heart sounds.  Pulmonary:     Effort: Pulmonary effort is normal.     Breath sounds: Normal breath sounds.  Abdominal:     Palpations: Abdomen is soft. There is no hepatomegaly, splenomegaly or mass.     Tenderness: There is no abdominal tenderness.  Musculoskeletal:     Right lower leg: No edema.     Left lower leg: No edema.  Lymphadenopathy:     Cervical: No cervical adenopathy.     Right cervical: No superficial, deep or posterior cervical adenopathy.    Left cervical: No superficial, deep or posterior cervical adenopathy.     Upper Body:     Right upper body: No supraclavicular or axillary adenopathy.     Left upper body: No supraclavicular or axillary adenopathy.  Neurological:     General: No focal deficit present.     Mental Status: He is alert and oriented to person, place, and time.  Psychiatric:        Mood and Affect: Mood normal.        Behavior: Behavior normal.     LABS:      Latest Ref Rng & Units 10/10/2023    3:36 AM 10/09/2023    4:53 AM 10/07/2023   10:26 AM  CBC  WBC 4.0 - 10.5 K/uL 3.6  4.6  7.3   Hemoglobin 13.0 - 17.0 g/dL 8.2  8.9  54.0   Hematocrit 39.0 - 52.0 % 25.1  27.3  36.6   Platelets 150 - 400 K/uL PLATELET CLUMPS NOTED ON SMEAR, COUNT APPEARS ADEQUATE  PLATELET CLUMPS NOTED ON SMEAR, UNABLE TO ESTIMATE  226  Latest Ref Rng & Units 10/10/2023    3:36 AM 10/09/2023    4:53 AM 10/08/2023   11:30 AM  CMP  Glucose 70 - 99 mg/dL 161  096  045   BUN 8 - 23 mg/dL 23  23  20    Creatinine 0.61 - 1.24  mg/dL 4.09  8.11  9.14   Sodium 135 - 145 mmol/L 138  140  140   Potassium 3.5 - 5.1 mmol/L 4.0  4.1  3.7   Chloride 98 - 111 mmol/L 110  108  109   CO2 22 - 32 mmol/L 20  22  20    Calcium 8.9 - 10.3 mg/dL 7.5  8.2  8.1      No results found for: "CEA1", "CEA" / No results found for: "CEA1", "CEA" No results found for: "PSA1" No results found for: "NWG956" No results found for: "CAN125"  No results found for: "TOTALPROTELP", "ALBUMINELP", "A1GS", "A2GS", "BETS", "BETA2SER", "GAMS", "MSPIKE", "SPEI" Lab Results  Component Value Date   TIBC 221 (L) 06/21/2023   TIBC 250 01/20/2023   TIBC 271 04/03/2009   FERRITIN 759 (H) 06/21/2023   FERRITIN 655 (H) 01/20/2023   FERRITIN 870 (H) 04/03/2009   IRONPCTSAT 18 06/21/2023   IRONPCTSAT 26 01/20/2023   IRONPCTSAT 72 (H) 04/03/2009   Lab Results  Component Value Date   LDH 248 (H) 01/20/2023     STUDIES:   CT Angio Chest PE W and/or Wo Contrast Result Date: 10/07/2023 CLINICAL DATA:  73 year old male with shortness of breath. Diarrhea. History of renal cell carcinoma status post left nephrectomy. * Tracking Code: BO * EXAM: CT ANGIOGRAPHY CHEST WITH CONTRAST TECHNIQUE: Multidetector CT imaging of the chest was performed using the standard protocol during bolus administration of intravenous contrast. Multiplanar CT image reconstructions and MIPs were obtained to evaluate the vascular anatomy. RADIATION DOSE REDUCTION: This exam was performed according to the departmental dose-optimization program which includes automated exposure control, adjustment of the mA and/or kV according to patient size and/or use of iterative reconstruction technique. CONTRAST:  75mL OMNIPAQUE IOHEXOL 350 MG/ML SOLN COMPARISON:  Portable chest noon today. PET-CT 05/06/2023 FINDINGS: Cardiovascular: Good contrast bolus timing in the pulmonary arterial tree. No pulmonary artery filling defect identified. Calcified aortic atherosclerosis. Heart size remains normal. No  pericardial effusion. Right chest Port-A-Cath in place. Mediastinum/Nodes: Lower posterior mediastinal, prevertebral malignant appearing lymphadenopathy (17 mm short axis series 4, image 86) in association with severe spinal metastases, see details below. Other mediastinal nodal stations remain within normal limits. Lungs/Pleura: Small, trace layering pleural effusions greater on the right. Upper Abdomen: Multiple hypodense liver masses are new compared to a CT abdomen on 06/20/2022, ranging from 10 mm up to 23 mm diameter. Visible spleen, pancreas, right adrenal gland, right kidney, and bowel appear within normal limits. Musculoskeletal: Severe spinal metastatic disease which has substantially progressed since the August PET-CT at that time only small T12 vertebral and left 10th rib lesions. But large lytic, destructive, expansile osseous lesions now occupy the T7 vertebra, costovertebral junction, T8 vertebra (with mild pathologic fracture), 8th rib costochondral junction, and destroyed a the posterior right 8th rib. Bulky tumor there encompasses up to 11 cm long axis (series 4, image 60). And there is evidence of tumor invasion into the thoracic spinal canal at both levels. T8 spinal stenosis is possible (series 4, image 59). Additional left eccentric T12 destructive and expansile metastasis with mild T12 pathologic fracture and spinal canal involvement (series 4, image 95 likely with spinal  cord mass effect). Destruction of the left T12 transverse process. Early extension into the left T11 vertebra there. Involvement of the left T12 costovertebral junction. Chronic enlargement and heterogeneity of the distal left clavicle is stable. Review of the MIP images confirms the above findings. IMPRESSION: 1. Severe Thoracic Spinal Metastatic Disease. Marked progression since an August PET-CT with four levels affected (T7, T8, T11, T12) pathologic vertebral fractures, destruction of the posterior right 8th rib, and tumor  extension into the spinal canal. Associated prevertebral lymph node metastases. 2. Multifocal liver metastases, new since 2023 and individually up to 2.3 cm. 3. Trace layering pleural effusions. No pulmonary embolus identified. 4.  Aortic Atherosclerosis (ICD10-I70.0). Salient findings discussed by telephone with PA CHRISTOPHER GROCE on 10/07/2023 at 13:19 . Electronically Signed   By: Odessa Fleming M.D.   On: 10/07/2023 13:23   DG Chest Port 1 View Result Date: 10/07/2023 CLINICAL DATA:  Shortness of breath. EXAM: PORTABLE CHEST 1 VIEW COMPARISON:  Chest x-ray dated September 30, 2023. FINDINGS: Unchanged right chest wall port catheter. The heart size and mediastinal contours are within normal limits. Normal pulmonary vascularity. No focal consolidation, pleural effusion, or pneumothorax. No acute osseous abnormality. IMPRESSION: 1. No active disease. Electronically Signed   By: Obie Dredge M.D.   On: 10/07/2023 12:26   DG Chest Port 1 View Result Date: 09/30/2023 CLINICAL DATA:  Shortness of breath EXAM: PORTABLE CHEST 1 VIEW COMPARISON:  01/25/2023 FINDINGS: Right Port-A-Cath remains in place, unchanged. Heart and mediastinal contours are within normal limits. No focal opacities or effusions. No acute bony abnormality. Old healed left clavicle and rib fractures. IMPRESSION: No active disease. Electronically Signed   By: Charlett Nose M.D.   On: 09/30/2023 11:28

## 2023-10-13 NOTE — Progress Notes (Signed)
   10/13/23 1648  Spiritual Encounters  Type of Visit Initial  Care provided to: Patient  Referral source Nurse (RN/NT/LPN)  Reason for visit Advance directives  OnCall Visit No   Provided HCPOA ed. Patient wants sister present. She will be at hospital on tomorrow around 1 pm.

## 2023-10-13 NOTE — TOC Progression Note (Signed)
Transition of Care [facility]) - Progression Note    Patient Details  Name: David [name] MRN: [id number] Date of Birth: 21-Sep-1950  [facility]) CM/SW Contact  Otelia Santee, LCSW Phone Number: 10/13/2023, 11:43 AM  Clinical Narrative:    Pt's insurance approved for SNF. Spoke with Kia at [facility] who originally confirmed pt able to transfer to their facility today however, as pt is getting daily XRT until 2/6 they are unable to accommodate getting pt to these appointments. Pt has no other SNF offers for placement. Awaiting response from [facility] to determine if they can accept closer to the end of pt's radiation.    Expected Discharge Plan:  (TBD) Barriers to Discharge: Continued Medical Work up  Expected Discharge Plan and Services       Living arrangements for the past 2 months: Single Family Home                                       Social Determinants of Health (SDOH) Interventions SDOH Screenings   Food Insecurity: No Food Insecurity (10/07/2023)  Housing: Low Risk  (10/07/2023)  Transportation Needs: No Transportation Needs (10/07/2023)  Utilities: Not At Risk (10/07/2023)  Depression (PHQ2-9): Low Risk  (01/12/2023)  Financial Resource Strain: Low Risk  (03/24/2023)   Received from [facility]  Recent Concern: Financial Resource Strain - High Risk (02/01/2023)  Social Connections: Unknown (03/07/2023)   Received from [facility]  Stress: Patient Declined (03/07/2023)   Received from [facility]  Tobacco Use: High Risk (10/09/2023)    Readmission Risk Interventions     No data to display

## 2023-10-13 NOTE — Progress Notes (Signed)
[facility] PROGRESS NOTE  David [name] (DOB: 06/11/1951) NGE:[id number] PCP: Assunta Found, MD  Brief Narrative: 73 y.o. male smoker with past medical history relevant for history of hep C previously treated with antivirals, HTN, and  Metastatic left kidney clear-cell RCC, s/p Left robotic assisted laparoscopic radical nephrectomy by Dr. Ronne Binning on 08/13/2022 with Pathology: at that time showing 7 cm clear-cell RCC with focal sarcomatoid and rhabdoid features, nuclear grade 4.  Tumor extended into the renal vein and renal sinus fat. PET scan (12/24/2022): Enlarging hypermetabolic left adrenal metastasis.  2 small hypermetabolic soft tissue nodules in the left nephrectomy bed.  Hypermetabolic lytic lesions involving distal left clavicle and left temporal bone.  Indeterminate small hypermetabolic parotid nodules bilaterally. 1/23>Transferred from [facility] for radiation oncology treatment and started on froday with plan for 10 rx last being on next Thursday. Palliative care following. At this time plan for SNF once bed available at [facility].  Subjective: No new complaints.   Objective: BP (!) 141/85 (BP Location: Left Arm)   Pulse 97   Temp 98 F (36.7 C) (Oral)   Resp 16   Ht 5\' 11"  (1.803 m)   Wt 89.8 kg   SpO2 100%   BMI 27.61 kg/m   Gen: No distress Pulm: Clear, nonlabored  CV: RRR, no MRG or edema GI: Soft, NT, ND, +BS  Neuro: Alert and oriented. No new focal deficits. Ext: Warm, no deformities. Skin: No new rashes, lesions or ulcers on visualized skin   Assessment & Plan: Metastatic left kidney RCC: s/p robotic assisted laparoscopic radical nephrectomy 07/27/2022. Has undergone therapy per Dr. Ellin Saba in [place]. - Continue follow up with oncology  Thoracic metastatic disease with pathologic fractures: Seen on CTA chest 1/23 with marked progression since an August PET with four levels affected (T7, T8, T11, T12) pathologic vertebral fractures, destruction  of the posterior right 8th rib, and tumor extension into the spinal canal. Associated prevertebral lymph node metastases. Multifocal liver metastases, new since 2023 and individually up to 2.3 cm.  - Was [facility] from [facility] for XRT, initiated 1/24, planning 10 fractions (final Tx 2/6).  - Continue steroid and pain control, palliative care consulted.    Dehydration/lactic acidosis: Resolved.     Acute diarrhea: Has been having diarrhea in the setting of his chemotherapy. Negative infectious work up. Improved.     AKI: Due to diarrhea, resolved.     Hypertension: Stable.  - Continue norvasc 10mg    BPH:  - Continue tamsulosin  Insomnia:  - Continue remeron qHS    [name], MD [facility] [email or web address] 10/13/2023, 5:12 PM

## 2023-10-14 ENCOUNTER — Encounter: Payer: Self-pay | Admitting: Hematology

## 2023-10-14 ENCOUNTER — Ambulatory Visit
Admit: 2023-10-14 | Discharge: 2023-10-14 | Disposition: A | Discharge status: Home / Self Care | Payer: Medicare PPO | Attending: Radiation Oncology | Admitting: Radiation Oncology

## 2023-10-14 ENCOUNTER — Inpatient Hospital Stay: Payer: Medicare PPO | Admitting: Hematology

## 2023-10-14 ENCOUNTER — Other Ambulatory Visit: Payer: Self-pay

## 2023-10-14 ENCOUNTER — Ambulatory Visit: Payer: Medicare PPO

## 2023-10-14 ENCOUNTER — Ambulatory Visit (HOSPITAL_COMMUNITY): Payer: Medicare PPO

## 2023-10-14 DIAGNOSIS — E440 Moderate protein-calorie malnutrition: Secondary | ICD-10-CM | POA: Insufficient documentation

## 2023-10-14 DIAGNOSIS — C7951 Secondary malignant neoplasm of bone: Secondary | ICD-10-CM | POA: Diagnosis not present

## 2023-10-14 LAB — CBC
HCT: 27.6 % — ABNORMAL LOW (ref 39.0–52.0)
Hemoglobin: 8.8 g/dL — ABNORMAL LOW (ref 13.0–17.0)
MCH: 33.3 pg (ref 26.0–34.0)
MCHC: 31.9 g/dL (ref 30.0–36.0)
MCV: 104.5 fL — ABNORMAL HIGH (ref 80.0–100.0)
Platelets: 187 10*3/uL (ref 150–400)
RBC: 2.64 MIL/uL — ABNORMAL LOW (ref 4.22–5.81)
RDW: 28.1 % — ABNORMAL HIGH (ref 11.5–15.5)
WBC: 4.8 10*3/uL (ref 4.0–10.5)
nRBC: 2.5 % — ABNORMAL HIGH (ref 0.0–0.2)

## 2023-10-14 LAB — RAD ONC ARIA SESSION SUMMARY

## 2023-10-14 LAB — COMPREHENSIVE METABOLIC PANEL
ALT: 51 U/L — ABNORMAL HIGH (ref 0–44)
AST: 35 U/L (ref 15–41)
Albumin: 2.8 g/dL — ABNORMAL LOW (ref 3.5–5.0)
Alkaline Phosphatase: 71 U/L (ref 38–126)
Anion gap: 7 (ref 5–15)
BUN: 37 mg/dL — ABNORMAL HIGH (ref 8–23)
CO2: 21 mmol/L — ABNORMAL LOW (ref 22–32)
Calcium: 7.9 mg/dL — ABNORMAL LOW (ref 8.9–10.3)
Chloride: 113 mmol/L — ABNORMAL HIGH (ref 98–111)
Creatinine, Ser: 0.83 mg/dL (ref 0.61–1.24)
GFR, Estimated: >60 mL/min (ref >60)
Glucose, Bld: 120 mg/dL — ABNORMAL HIGH (ref 70–99)
Potassium: 4.3 mmol/L (ref 3.5–5.1)
Sodium: 141 mmol/L (ref 135–145)
Total Bilirubin: 1.2 mg/dL (ref 0.0–1.2)
Total Protein: 5.3 g/dL — ABNORMAL LOW (ref 6.5–8.1)

## 2023-10-14 MED ORDER — ENOXAPARIN SODIUM 40 MG/0.4ML IJ SOSY
40.0000 mg | PREFILLED_SYRINGE | INTRAMUSCULAR | Status: DC
  Administered 2023-10-14 – 2023-10-24 (×11): 40 mg via SUBCUTANEOUS
  Filled 2023-10-14 (×11): qty 0.4

## 2023-10-14 MED ORDER — POLYETHYLENE GLYCOL 3350 17 G PO PACK
17.0000 g | PACK | Freq: Every day | ORAL | Status: DC
  Administered 2023-10-15 – 2023-10-18 (×4): 17 g via ORAL
  Filled 2023-10-14 (×8): qty 1

## 2023-10-14 MED ORDER — DEXAMETHASONE 4 MG PO TABS
6.0000 mg | ORAL_TABLET | Freq: Every day | ORAL | Status: DC
Start: 2023-10-15 — End: 2023-10-25
  Administered 2023-10-15 – 2023-10-25 (×11): 6 mg via ORAL
  Filled 2023-10-14 (×6): qty 2
  Filled 2023-10-14: qty 1
  Filled 2023-10-14: qty 2
  Filled 2023-10-14 (×3): qty 1

## 2023-10-14 NOTE — Plan of Care (Signed)

## 2023-10-14 NOTE — Progress Notes (Signed)
[facility] PROGRESS NOTE  KEIGEN CADDELL (DOB: 09/19/1950) ION:[id number] PCP: Assunta Found, MD  Brief Narrative: 73 y.o. male smoker with past medical history relevant for history of hep C previously treated with antivirals, HTN, and  Metastatic left kidney clear-cell RCC, s/p Left robotic assisted laparoscopic radical nephrectomy by Dr. Ronne Binning on 08/13/2022 with Pathology: at that time showing 7 cm clear-cell RCC with focal sarcomatoid and rhabdoid features, nuclear grade 4.  Tumor extended into the renal vein and renal sinus fat. PET scan (12/24/2022): Enlarging hypermetabolic left adrenal metastasis.  2 small hypermetabolic soft tissue nodules in the left nephrectomy bed.  Hypermetabolic lytic lesions involving distal left clavicle and left temporal bone.  Indeterminate small hypermetabolic parotid nodules bilaterally. 1/23>Transferred from [facility] for radiation oncology treatment and started on froday with plan for 10 rx last being on next Thursday. Palliative care following. At this time plan for SNF once bed available at [facility].  Subjective: Some constipation, BSC false alarms. Tolerating radiation just fine, looking for chaplain to complete some paperwork.   Objective: BP (!) 141/65   Pulse 61   Temp (!) 97.3 F (36.3 C) (Oral)   Resp 16   Ht 5\' 11"  (1.803 m)   Wt 89.2 kg   SpO2 100%   BMI 27.43 kg/m   Gen: No distress Pulm: Clear, nonlabored  CV: RRR, no MRG GI: Soft, NT, ND, +BS  Neuro: Alert and oriented. No new focal deficits. Ext: Warm, no deformities. Skin: No new rashes, lesions or ulcers on visualized skin   Assessment & Plan: Metastatic left kidney RCC: s/p robotic assisted laparoscopic radical nephrectomy 72/13/2023. Has undergone therapy per Dr. Ellin Saba in [place]. - Continue follow up with oncology  Thoracic metastatic disease with pathologic fractures: Seen on CTA chest 1/23 with marked progression since an August PET with four levels  affected (T7, T8, T11, T12) pathologic vertebral fractures, destruction of the posterior right 8th rib, and tumor extension into the spinal canal. Associated prevertebral lymph node metastases. Multifocal liver metastases, new since 2023 and individually up to 2.3 cm.  - Was transferred from [facility] for [facility]RT, initiated 1/24, planning 10 fractions (final Tx 2/6).  - Continue steroid, will decrease intensity, and pain control, palliative care consulted.    Dehydration/lactic acidosis: Resolved.     Acute diarrhea: Has been having diarrhea in the setting of his chemotherapy. Negative infectious work up. Improved.     AKI: Due to diarrhea, resolved.  Confirmed to be resolved on 72/30.  Anemia of chronic disease: Hgb stable in high 8's, no bleeding. Monitor intermittently.   Constipation:  - Regular bowel regimen in place, abd exam benign    Hypertension: Stable.  - Continue norvasc 10mg    BPH:  - Continue tamsulosin  Insomnia:  - Continue remeron qHS    [name], MD [facility] [email or web address] 10/14/2023, 4:26 PM

## 2023-10-14 NOTE — Progress Notes (Signed)
Physical Therapy Treatment Patient Details Name: David [name] MRN: [id number] DOB: 1951-02-16 Today's Date: 10/14/2023   History of Present Illness Pt is 73 yo male admitted on 10/07/23 with metastatic cancer to bone.  Pt transferred to [facility] from [facility] for radiation oncology treatment.  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    PT Comments  Pt comes to sit EOB with assist of 2 for LE management and trunk control. Pt reports having a head cold which impedes respiratory abilities-O2 sats monitored with drop to 73% in sitting EOB. Pt assisted to supine and repositioned with recovery, O2 returns to 96%. Pt positioned with call bell and HOB elevated, family present and lunch on the way.    If plan is discharge home, recommend the following: Two people to help with walking and/or transfers;Two people to help with bathing/dressing/bathroom   Can travel by private vehicle     No  Equipment Recommendations  Other (comment) (defer post acute)    Recommendations for Other Services       Precautions / Restrictions Precautions Precautions: Fall Precaution Comments: Pain - premedicate Restrictions Weight Bearing Restrictions Per Provider Order: No     Mobility  Bed Mobility Overal bed mobility: Needs Assistance Bed Mobility: Sit to Supine, Supine to Sit     Supine to sit: Max assist, +2 for physical assistance Sit to supine: Max assist, +2 for physical assistance   General bed mobility comments: Pt does not tolerate position changes, O2 sats drop to 73% with sitting EOB, requires CGA for trunk control    Transfers                   General transfer comment: unable to progress past EOB due to SOB and pain in L trunk    Ambulation/Gait                   Stairs             Wheelchair Mobility     Tilt Bed    Modified Rankin (Stroke Patients Only)       Balance Overall balance  assessment: Needs assistance Sitting-balance support: Bilateral upper extremity supported Sitting balance-Leahy Scale: Poor Sitting balance - Comments: Bil UE support and CGA for trunk control in sitting Postural control: Left lateral lean, Right lateral lean, Posterior lean                                  Cognition Arousal: Alert Behavior During Therapy: WFL for tasks assessed/performed Overall Cognitive Status: Within Functional Limits for tasks assessed                                 General Comments: Pt denies pain at beginning of session, pain increases with movement in L anterolateral trunk (radiation spot)        Exercises      General Comments        Pertinent Vitals/Pain Pain Assessment Pain Assessment: Faces Faces Pain Scale: Hurts a little bit Pain Location: L anterolateral trunk Pain Intervention(s): Limited activity within patient's tolerance, Monitored during session    Home Living                          Prior Function  PT Goals (current goals can now be found in the care plan section) Acute Rehab PT Goals Patient Stated Goal: decrease pain PT Goal Formulation: With patient Time For Goal Achievement: 10/25/23 Potential to Achieve Goals: Fair Progress towards PT goals: Progressing toward goals    Frequency    Min 1X/week      PT Plan      Co-evaluation              AM-PAC PT "6 Clicks" Mobility   Outcome Measure  Help needed turning from your back to your side while in a flat bed without using bedrails?: Total Help needed moving from lying on your back to sitting on the side of a flat bed without using bedrails?: Total Help needed moving to and from a bed to a chair (including a wheelchair)?: Total Help needed standing up from a chair using your arms (e.g., wheelchair or bedside chair)?: Total Help needed to walk in hospital room?: Total Help needed climbing 3-5 steps with a  railing? : Total 6 Click Score: 6    End of Session   Activity Tolerance: Patient limited by pain;Patient limited by fatigue;Treatment limited secondary to medical complications (Comment) Patient left: in bed;with call bell/phone within reach;with bed alarm set;with SCD's reapplied;with family/visitor present Nurse Communication: Mobility status;Other (comment) (O2 sats) PT Visit Diagnosis: Other abnormalities of gait and mobility (R26.89);Muscle weakness (generalized) (M62.81)     Time: 2130-8657 PT Time Calculation (min) (ACUTE ONLY): 23 min  Charges:    $Therapeutic Activity: 23-37 mins PT General Charges $$ ACUTE PT VISIT: 1 Visit                     David [name], PT Acute Rehabilitation Services Office: [phone] 10/14/2023    David [name] 10/14/2023, 2:43 PM

## 2023-10-14 NOTE — Progress Notes (Signed)
Chaplain met with David [name] who had been expecting to see chaplain today at a certain time for a follow up on HCPOA paperwork.  He shared that he and his sister had some questions about the living will portion of the advance directives.  Chaplain will return at 12:00 tomorrow to answer questions and assist with getting document notarized.   [address] Bcc Pager, [phone]

## 2023-10-14 NOTE — Progress Notes (Signed)
Nutrition Follow-up  DOCUMENTATION CODES:   Non-severe (moderate) malnutrition in context of chronic illness  INTERVENTION:  - Regular diet.  - Boost Plus po BID, each supplement provides 360 kcal and 14 grams of protein - Encourage intake at all meals and of supplements. - Once complete with radiation therapy, recommend Multivitamin with minerals daily, 500mg  vitamin C and 220mg  zinc x14 days to support wound healing.  - Monitor weight trends.   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 *new  GOAL:   Patient will meet greater than or equal to 90% of their needs *progressing  MONITOR:   PO intake, Supplement acceptance, Weight trends  REASON FOR ASSESSMENT:   Consult Assessment of nutrition requirement/status  ASSESSMENT:   73 y.o. 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  Patient reports a UBW of "close to 300#" and weight loss over the past ~1 year due to cancer and treatment.  Per EMR, patient weighed at 248# in April 2024 and dropped to 197# by December. This is a 51# or 20.5% weight loss in 8 months, which is significant for the time frame. Patient confirms this to be accurate. Does feel it has been more stable recently and weight taken today at 196#. Patient notes some of this weight may have been intentional as he started eating healthier. However, the majority was unintentional and severe.   He reports typically eating 3 meals a day at home. However, notes that it would often be frustrating to eat because he would often have diarrhea quickly after  eating. He was also drinking Boost Plus 1-2 times per day and enjoys it.   Current appetite is fair and patient reports he has been eating fairly well since admission. He is documented to be consuming 25-100% of meals. Drinking Boost 0-2x/day according to the [facility]. However, he reports there are some days he has not received any Boost. Question if supply issue has prevented patient from getting it twice daily. He is okay with getting Ensure as well. Encouraged patient to continue to eat well and consume supplements to help prevent further weight loss.   Medications reviewed and include: Remeron, Senokot  Labs reviewed:  No BMP since 1/26    10/14/2023    1123  Subcutaneous Fat   Orbital Region Moderate depletion  Upper Arm Region Moderate depletion  Thoracic and Lumbar Region Mild depletion  Buccal Region Mild depletion  Muscle   Temple Region Moderate depletion  Clavicle Bone Region Mild depletion  Clavicle and Acromion Bone Region Moderate depletion  Scapular Bone Region Unable to assess  Dorsal Hand Mild depletion  Patellar Region No depletion  Anterior Thigh Region No depletion  Posterior Calf Region No depletion  Edema RD   Edema (RD Assessment) None  Micronutrient Assessment   Hair Reviewed  Eyes Reviewed  Mouth Reviewed  Skin Reviewed  Nails Reviewed      Diet Order:   Diet Order             Diet regular Room service appropriate? Yes; Fluid consistency: Thin  Diet effective now  EDUCATION NEEDS:  Education needs have been addressed  Skin:  Skin Assessment: Skin Integrity Issues: Skin Integrity Issues:: Stage III Stage III: Left Buttocks - per CWOCN  Last BM:  1/30 - type 6  Height:  Ht Readings from Last 1 Encounters:  10/07/23 5\' 11"  (1.803 m)   Weight:  Wt Readings from Last 1 Encounters:  10/14/23 89.2 kg    BMI:  Body mass index is 27.43 kg/m.  Estimated Nutritional Needs:  Kcal:  2000-2200 kcals Protein:   100-115 grams Fluid:  >/= 2L   Shelle Iron RD, LDN Contact via Secure Chat.

## 2023-10-15 ENCOUNTER — Other Ambulatory Visit: Payer: Self-pay

## 2023-10-15 ENCOUNTER — Ambulatory Visit
Admit: 2023-10-15 | Discharge: 2023-10-15 | Disposition: A | Discharge status: Home / Self Care | Payer: Medicare PPO | Attending: Radiation Oncology | Admitting: Radiation Oncology

## 2023-10-15 DIAGNOSIS — C7951 Secondary malignant neoplasm of bone: Secondary | ICD-10-CM | POA: Diagnosis not present

## 2023-10-15 LAB — RAD ONC ARIA SESSION SUMMARY

## 2023-10-15 MED ORDER — SALINE SPRAY 0.65 % NA SOLN
1.0000 | NASAL | Status: DC | PRN
  Filled 2023-10-15: qty 44

## 2023-10-15 NOTE — Progress Notes (Signed)
Occupational Therapy Treatment Patient Details Name: David [name] MRN: [id number] DOB: November 18, 1950 Today's Date: 10/15/2023   History of present illness Pt is 73 yo male admitted on 10/07/23 with metastatic cancer to bone/spine/liver.  Pt transferred to [facility] from [facility] for [facility] oncology treatment.  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   OT comments  Pt making slow progress with all adls secondary so significant pain in L rib area. Pt coming to EOB for adls with max assist but unable to tolerate sitting EOB for more than 2 minutes due to extreme pain.  Pt unable to activity move B ankles,knees, or hips today beyond 1/5 strength when coming to EOB. Pt was 3/5 strength on eval in LEs in ankles and knees.  Do not feel standing will be an option for this pt for transfers.  Will continue to see with focus on adls within his pain tolerance.      If plan is discharge home, recommend the following:  A lot of help with bathing/dressing/bathroom;Two people to help with walking and/or transfers;Assistance with cooking/housework;Assist for transportation;Help with stairs or ramp for entrance   Equipment Recommendations       Recommendations for Other Services      Precautions / Restrictions Precautions Precautions: Fall Precaution Comments: Pain - premedicate Restrictions Weight Bearing Restrictions Per Provider Order: No       Mobility Bed Mobility Overal bed mobility: Needs Assistance Bed Mobility: Supine to Sit, Sit to Supine     Supine to sit: Max assist, HOB elevated Sit to supine: Max assist, +2 for physical assistance   General bed mobility comments: Pt does not tolerate position changes, O2 sats drop to 80% with sitting EOB, requires CGA for trunk control    Transfers                   General transfer comment: unable to progress past EOB due to SOB and pain in L trunk     Balance Overall  balance assessment: Needs assistance Sitting-balance support: Bilateral upper extremity supported Sitting balance-Leahy Scale: Poor Sitting balance - Comments: Pt is in so much pain sitting that he becomes SOB and cannot tolerate sitting for more than appx 2 minutes. pt can prop with BUES and hold self for short spurts of time but otherwise needs assist to maintain balance on EOB. Postural control: Left lateral lean, Right lateral lean, Posterior lean   Standing balance-Leahy Scale: Zero Standing balance comment: Pt unable to stand. When sitting on EOB, pt was unable to activity move ankles or move knees more than 1/5 strength.  Feel LEs are getting weaker.                           ADL either performed or assessed with clinical judgement   ADL Overall ADL's : Needs assistance/impaired Eating/Feeding: Set up;Sitting;Bed level   Grooming: Wash/dry hands;Wash/dry face;Oral care;Applying deodorant;Set up;Sitting;Bed level Grooming Details (indicate cue type and reason): sat bed all the way up for grooming tasks after sitting EOB. Pt unable to tolerate grooming on EOB due to pain.                   Toilet Transfer Details (indicate cue type and reason): unable to tolerate at this time due to pain Toileting- Clothing Manipulation and Hygiene: Total assistance;Bed level       Functional mobility during ADLs:  Maximal assistance;+2 for physical assistance General ADL Comments: Pt with L side pain due to bone ca. pt transferred to EOB with max a x1 and HOB all the way up.  Pt unable to tolerate sitting more than appx 2 minutes due to pain and pt returned to supine.    Extremity/Trunk Assessment Upper Extremity Assessment Upper Extremity Assessment: Overall WFL for tasks assessed   Lower Extremity Assessment Lower Extremity Assessment: Defer to PT evaluation        Vision   Vision Assessment?: No apparent visual deficits   Perception Perception Perception: Within  Functional Limits   Praxis Praxis Praxis: WFL    Cognition Arousal: Alert Behavior During Therapy: WFL for tasks assessed/performed Overall Cognitive Status: Within Functional Limits for tasks assessed                                 General Comments: Pain is not as much of an issue at rest but once moving, pt in great pain, becomes SOB and wasnt able to get good breathe in. Nurse ordered a saline nose spray as pt states he is clogged up and can't get a good breath in.        Exercises      Shoulder Instructions       General Comments Pt very limited due to pain and weakness.  Feel LEs are getting weaker as pt unable to move them as well in the bed possibly due to thorasic spine issues.    Pertinent Vitals/ Pain       Pain Assessment Pain Assessment: 0-10 Pain Score: 10-Worst pain ever Pain Location: L anterolateral trunk Pain Descriptors / Indicators: Sharp, Grimacing Pain Intervention(s): Limited activity within patient's tolerance, Monitored during session, Premedicated before session, Repositioned, Relaxation  Home Living                                          Prior Functioning/Environment              Frequency  Min 1X/week        Progress Toward Goals  OT Goals(current goals can now be found in the care plan section)  Progress towards OT goals: Progressing toward goals  Acute Rehab OT Goals Patient Stated Goal: to have less pain OT Goal Formulation: With patient Time For Goal Achievement: 10/25/23 Potential to Achieve Goals: Good ADL Goals Pt Will Perform Grooming: with min assist;with contact guard assist;sitting Pt Will Perform Upper Body Bathing: with mod assist;with min assist;sitting Pt Will Perform Upper Body Dressing: with mod assist;with min assist;sitting Pt Will Transfer to Toilet: with max assist;with mod assist;stand pivot transfer;bedside commode Additional ADL Goal #1: Pt will complete bed mobility  mod A to sit EOB in prep for  ADL tasks  Plan      Co-evaluation                 AM-PAC OT "6 Clicks" Daily Activity     Outcome Measure   Help from another person eating meals?: A Little Help from another person taking care of personal grooming?: A Little Help from another person toileting, which includes using toliet, bedpan, or urinal?: Total Help from another person bathing (including washing, rinsing, drying)?: Total Help from another person to put on and taking off regular upper body clothing?: Total Help from  another person to put on and taking off regular lower body clothing?: Total 6 Click Score: 10    End of Session    OT Visit Diagnosis: Muscle weakness (generalized) (M62.81);Pain Pain - Right/Left: Left Pain - part of body:  (rib pain)   Activity Tolerance Patient limited by pain   Patient Left in bed;with call bell/phone within reach   Nurse Communication Mobility status;Other (comment) (need for possible nasal spray and LEs weaker than on admission.)        Time: 1029-1055 OT Time Calculation (min): 26 min  Charges: OT General Charges $OT Visit: 1 Visit OT Treatments $Self Care/Home Management : 23-37 mins   Hope Budds 10/15/2023, 11:09 AM

## 2023-10-15 NOTE — Plan of Care (Signed)

## 2023-10-15 NOTE — Progress Notes (Signed)
[facility] PROGRESS NOTE  David [name] (DOB: Feb 25, 1951) VOZ:[id number] PCP: Assunta Found, MD  Brief Narrative: 73 y.o. male smoker with past medical history relevant for history of hep C previously treated with antivirals, HTN, and  Metastatic left kidney clear-cell RCC, s/p Left robotic assisted laparoscopic radical nephrectomy by Dr. Ronne Binning on 08/13/2022 with Pathology: at that time showing 7 cm clear-cell RCC with focal sarcomatoid and rhabdoid features, nuclear grade 4.  Tumor extended into the renal vein and renal sinus fat. PET scan (12/24/2022): Enlarging hypermetabolic left adrenal metastasis.  2 small hypermetabolic soft tissue nodules in the left nephrectomy bed.  Hypermetabolic lytic lesions involving distal left clavicle and left temporal bone.  Indeterminate small hypermetabolic parotid nodules bilaterally. 1/23>Transferred from [place] for radiation oncology treatment and started on froday with plan for 10 rx last being on next Thursday. Palliative care following. At this time plan for SNF once bed available at [facility].  Subjective: No new complaints, "I'm fine."   Objective: BP 116/69 (BP Location: Left Arm)   Pulse 100   Temp 97.6 F (36.4 C)   Resp 15   Ht 5\' 11"  (1.803 m)   Wt 89.2 kg   SpO2 100%   BMI 27.43 kg/m   Gen: No distress, chronically ill-appearing male Pulm: Clear, nonlabored  CV: RRR, no MRG or pitting edema GI: Soft, NT, ND, +BS  Neuro: Alert and oriented. No new focal deficits. Ext: Warm, no deformities. Skin: No new rashes, lesions or ulcers on visualized skin   Assessment & Plan: Metastatic left kidney RCC: s/p robotic assisted laparoscopic radical nephrectomy 07/27/2022. Has undergone therapy per Dr. Ellin Saba in [place] . - Continue follow up with oncology  Thoracic metastatic disease with pathologic fractures: Seen on CTA chest 1/23 with marked progression since an August PET with four levels affected (T7, T8, T11, T12)  pathologic vertebral fractures, destruction of the posterior right 8th rib, and tumor extension into the spinal canal. Associated prevertebral lymph node metastases. Multifocal liver metastases, new since 2023 and individually up to 2.3 cm.  - Was transferred from [facility] for [facility]RT, initiated 1/24, planning 10 fractions (final Tx 2/6).  - Continue steroid, decreased intensity, and pain control, palliative care consulted.    Dehydration/lactic acidosis: Resolved.     Acute diarrhea: Has been having diarrhea in the setting of his chemotherapy. Negative infectious work up. Improved.     AKI: Due to diarrhea, resolved.  Confirmed to be resolved on 1/30.  Anemia of chronic disease: Hgb stable in high 8's, no bleeding. Monitor intermittently.   Constipation:  - Regular bowel regimen in place, abd exam benign    Hypertension: Stable.  - Continue norvasc 10mg    BPH:  - Continue tamsulosin  Insomnia:  - Continue remeron qHS    [name], MD [facility] [email or web address] 10/15/2023, 11:04 AM

## 2023-10-15 NOTE — Progress Notes (Signed)
Chaplain met with David [name] and his sister and answered all questions about advance directive paperwork.  Document was signed and notarized.  A copy was placed in the chart and scanned and sent to [facility].

## 2023-10-15 NOTE — Progress Notes (Signed)
PT Cancellation Note  Patient Details Name: David [name] MRN: [id number] DOB: 09-18-1950   Cancelled Treatment:    Reason Eval/Treat Not Completed: Pain limiting ability to participate Pt fatigued from OT earlier and reporting too much pain to participate at this time.   Janan Halter [place] 10/15/2023, 3:19 PM Paulino Door, DPT Physical Therapist Acute Rehabilitation Services Office: [phone]

## 2023-10-15 NOTE — Plan of Care (Signed)

## 2023-10-16 DIAGNOSIS — C7951 Secondary malignant neoplasm of bone: Secondary | ICD-10-CM | POA: Diagnosis not present

## 2023-10-16 NOTE — Plan of Care (Signed)

## 2023-10-16 NOTE — Progress Notes (Signed)
Dressing change complete on L hip per order.  Pt tolerated well and denied pain.

## 2023-10-16 NOTE — Progress Notes (Signed)
[facility] PROGRESS NOTE  KEEFE ZAWISTOWSKI (DOB: Jul 11, 1951) UJW:[id number] PCP: Assunta Found, MD  Brief Narrative: 73 y.o. male smoker with past medical history relevant for history of hep C previously treated with antivirals, HTN, and  Metastatic left kidney clear-cell RCC, s/p Left robotic assisted laparoscopic radical nephrectomy by Dr. Ronne Binning on 08/13/2022 with Pathology: at that time showing 7 cm clear-cell RCC with focal sarcomatoid and rhabdoid features, nuclear grade 4.  Tumor extended into the renal vein and renal sinus fat. PET scan (12/24/2022): Enlarging hypermetabolic left adrenal metastasis.  2 small hypermetabolic soft tissue nodules in the left nephrectomy bed.  Hypermetabolic lytic lesions involving distal left clavicle and left temporal bone.  Indeterminate small hypermetabolic parotid nodules bilaterally. 1/23>Transferred from [facility] for radiation oncology treatment and started on froday with plan for 10 rx last being on next Thursday. Palliative care following. At this time plan for SNF once bed available at [facility].  Subjective: No complaints, pain controlled, had BM this morning, some soreness on left abdomen.  Objective: BP 111/74 (BP Location: Left Arm)   Pulse (!) 101   Temp 98.5 F (36.9 C) (Oral)   Resp 18   Ht 5\' 11"  (1.803 m)   Wt 89.2 kg   SpO2 92%   BMI 27.43 kg/m   No distress Clear, nonlabored RRR, no MRG Hypoactive BS, NT, ND  Assessment & Plan: Metastatic left kidney RCC: s/p robotic assisted laparoscopic radical nephrectomy 07/27/2022. Has undergone therapy per Dr. Ellin Saba in [place]. - Continue follow up with oncology  Thoracic metastatic disease with pathologic fractures: Seen on CTA chest 1/23 with marked progression since an August PET with four levels affected (T7, T8, T11, T12) pathologic vertebral fractures, destruction of the posterior right 8th rib, and tumor extension into the spinal canal. Associated prevertebral  lymph node metastases. Multifocal liver metastases, new since 2023 and individually up to 2.3 cm.  - Was [facility]RT, initiated 1/24, planning 10 fractions (final Tx 2/6). Next Tx 2/3 on schedule. - Continue steroid, decreased intensity, and pain control, palliative care consulted.  Stable.   Dehydration/lactic acidosis: Resolved.     Acute diarrhea: Has been having diarrhea in the setting of his chemotherapy. Negative infectious work up. Improved.     AKI: Due to diarrhea, resolved.  Confirmed to be resolved on 1/30.  Anemia of chronic disease: Hgb stable in high 8's, no bleeding. Monitor intermittently.   Constipation:  - Regular bowel regimen in place, abd exam benign, will continue at this dose. Had BM 2/1.   Hypertension: Stable.  - Continue norvasc 10mg    BPH:  - Continue tamsulosin  Insomnia:  - Continue remeron qHS    [name], MD [facility] [email or web address] 10/16/2023, 11:00 AM

## 2023-10-17 DIAGNOSIS — Z515 Encounter for palliative care: Secondary | ICD-10-CM

## 2023-10-17 DIAGNOSIS — M8448XA Pathological fracture, other site, initial encounter for fracture: Secondary | ICD-10-CM

## 2023-10-17 DIAGNOSIS — G893 Neoplasm related pain (acute) (chronic): Secondary | ICD-10-CM

## 2023-10-17 DIAGNOSIS — C7949 Secondary malignant neoplasm of other parts of nervous system: Secondary | ICD-10-CM

## 2023-10-17 DIAGNOSIS — C7951 Secondary malignant neoplasm of bone: Secondary | ICD-10-CM | POA: Diagnosis not present

## 2023-10-17 DIAGNOSIS — C787 Secondary malignant neoplasm of liver and intrahepatic bile duct: Secondary | ICD-10-CM

## 2023-10-17 NOTE — Progress Notes (Addendum)
  Daily Progress Note   Patient Name: David [name]       Date: 10/17/2023 DOB: 12/14/1950  Age: 73 y.o. MRN#: [id number] Attending Physician: [name], MD Primary Care Physician: Assunta Found, MD Admit Date: 10/07/2023 Length of Stay: 10 days  Reason for Consultation/Follow-up: Establishing goals of care  Subjective:   Subjective:  Reviewed EMR.  In past 24 hours patient has not required any as needed medications. Patient seen comfortable in bed today visiting with family. Plan is for patient to continue 10 fractions total of radiation with next therapy on 2/3 and last one being 2/6.Will likely be attending rehab after completion of radiation therapy.  Objective:   Vital Signs:  BP 103/72 (BP Location: Left Arm)   Pulse 93   Temp 97.8 F (36.6 C) (Oral)   Resp 18   Ht 5\' 11"  (1.803 m)   Wt 89.2 kg   SpO2 100%   BMI 27.43 kg/m   I personally reviewed recent imaging.   Assessment & Plan:   Assessment: Patient is 73 year old male with past medical history of tobacco use, hep C previously treated with antivirals, hypertension, and metastatic left kidney clear-cell RCC status post robotic assisted laparoscopic radical nephrectomy on 08/13/2022 who was transferred from [facility] on 10/07/2023 for patient to receive radiation collagen treatment.  Palliative medicine team consulted for complex medical decision making.  Recommendations/Plan: # Complex medical decision making/goals of care:  - Patient continued discussions with oncology regarding cancer directed therapies and plans to follow-up with them in the outpatient setting.  Patient to complete 10 fractions of radiation with last 1 being 10/21/2023.  -  Code Status: Full Code  # Symptom management: Patient is receiving these palliative interventions for symptom management with an intent to improve quality of life.   -Pain, in the setting of metastatic clear-cell RCC Pain well-controlled at this time.  Patient not  requiring any as needed opioids.  Continue as needed oxycodone 5 mg every 4 hours.  # Psychosocial Support:  -chaplain involved   # Discharge Planning: Skilled Nursing Facility for rehab with Palliative care service follow-up  -Patient already referred to inquire [facility] compassionate care for outpatient palliative medicine services  Thank you for allowing the palliative care team to participate in the care [name].  Palliative medicine team will continue to follow along peripherally. Please reach out if acute palliative medicine needs arise.  Alvester Morin, DO Palliative Care Provider PMT # [phone]  If patient remains symptomatic despite maximum doses, please call PMT at [phone] between 0700 and 1900. Outside of these hours, please call attending, as PMT does not have night coverage.

## 2023-10-17 NOTE — Progress Notes (Addendum)
[facility] PROGRESS NOTE  David [name] (DOB: Feb 15, 1951) ZOX:[id number] PCP: Assunta Found, MD  Brief Narrative: 73 y.o. male smoker with past medical history relevant for history of hep C previously treated with antivirals, HTN, and  Metastatic left kidney clear-cell RCC, s/p Left robotic assisted laparoscopic radical nephrectomy by Dr. Ronne Binning on 08/13/2022 with Pathology: at that time showing 7 cm clear-cell RCC with focal sarcomatoid and rhabdoid features, nuclear grade 4.  Tumor extended into the renal vein and renal sinus fat. PET scan (12/24/2022): Enlarging hypermetabolic left adrenal metastasis.  2 small hypermetabolic soft tissue nodules in the left nephrectomy bed.  Hypermetabolic lytic lesions involving distal left clavicle and left temporal bone.  Indeterminate small hypermetabolic parotid nodules bilaterally. 1/23>Transferred from [facility] for radiation oncology treatment and started on froday with plan for 10 rx last being on next Thursday. Palliative care following. At this time plan for SNF once bed available at [facility].  Subjective: Feels fine, no complaints.  Objective: BP (!) 140/91   Pulse (!) 104   Temp (!) 97.3 F (36.3 C) (Oral)   Resp 18   Ht 5\' 11"  (1.803 m)   Wt 89.2 kg   SpO2 99%   BMI 27.43 kg/m   No distress Regular borderline tachycardia, no MRG Clear, nonlabored R chest port site accessed, c/d/i  Assessment & Plan: Metastatic left kidney RCC: s/p robotic assisted laparoscopic radical nephrectomy 07/27/2022. Has undergone therapy per Dr. Ellin Saba in [place]. - Continue follow up with oncology  Thoracic metastatic disease with pathologic fractures: Seen on CTA chest 1/23 with marked progression since an August PET with four levels affected (T7, T8, T11, T12) pathologic vertebral fractures, destruction of the posterior right 8th rib, and tumor extension into the spinal canal. Associated prevertebral lymph node metastases. Multifocal  liver metastases, new since 2023 and individually up to 2.3 cm.  - Was [facility]RT, initiated 1/24, planning 10 fractions (final Tx 2/6). Next Tx 2/3 on schedule. - Continue steroid, decreased intensity. No ongoing need for prn oxycodone, will continue. Appreciate palliative care following and will follow at SNF after DC.  Dehydration/lactic acidosis: Resolved.     Acute diarrhea: Has been having diarrhea in the setting of his chemotherapy. Negative infectious work up. Improved.     AKI: Due to diarrhea, resolved.  Confirmed to be resolved on 1/30.  Anemia of chronic disease: Hgb stable in high 8's, no bleeding. Monitor intermittently.   Constipation:  - Regular bowel regimen in place, abd exam benign, will continue at this dose. Had BM 2/1.   Hypertension: Stable.  - Continue norvasc 10mg    BPH:  - Continue tamsulosin  Insomnia:  - Continue remeron qHS    [name], MD [facility] [email or web address] 10/17/2023, 1:16 PM

## 2023-10-17 NOTE — Plan of Care (Signed)

## 2023-10-18 ENCOUNTER — Ambulatory Visit (HOSPITAL_COMMUNITY): Payer: Medicare PPO

## 2023-10-18 ENCOUNTER — Other Ambulatory Visit: Payer: Self-pay

## 2023-10-18 ENCOUNTER — Ambulatory Visit
Admission: RE | Admit: 2023-10-18 | Discharge: 2023-10-18 | Disposition: A | Discharge status: Home / Self Care | Payer: Medicare PPO | Source: Ambulatory Visit | Attending: Radiation Oncology | Admitting: Radiation Oncology

## 2023-10-18 DIAGNOSIS — C7951 Secondary malignant neoplasm of bone: Secondary | ICD-10-CM | POA: Diagnosis not present

## 2023-10-18 LAB — RAD ONC ARIA SESSION SUMMARY
Course Elapsed Days: 10
Plan Fractions Treated to Date: 7
Plan Fractions Treated to Date: 7
Plan Prescribed Dose Per Fraction: 3 Gy
Plan Prescribed Dose Per Fraction: 3 Gy
Plan Total Fractions Prescribed: 10
Plan Total Fractions Prescribed: 10
Plan Total Prescribed Dose: 30 Gy
Plan Total Prescribed Dose: 30 Gy
Reference Point Dosage Given to Date: 21 Gy
Reference Point Dosage Given to Date: 21 Gy
Reference Point Session Dosage Given: 3 Gy
Reference Point Session Dosage Given: 3 Gy
Session Number: 7

## 2023-10-18 NOTE — Plan of Care (Signed)
  Problem: Education: Goal: Knowledge of General Education information will improve Description: Including pain rating scale, medication(s)/side effects and non-pharmacologic comfort measures Outcome: Progressing   Problem: Clinical Measurements: Goal: Respiratory complications will improve Outcome: Progressing Goal: Cardiovascular complication will be avoided Outcome: Progressing   Problem: Nutrition: Goal: Adequate nutrition will be maintained Outcome: Progressing   Problem: Coping: Goal: Level of anxiety will decrease Outcome: Progressing   Problem: Elimination: Goal: Will not experience complications related to bowel motility Outcome: Progressing Goal: Will not experience complications related to urinary retention Outcome: Progressing   Problem: Pain Managment: Goal: General experience of comfort will improve and/or be controlled Outcome: Progressing

## 2023-10-18 NOTE — TOC Progression Note (Signed)
Transition of Care [facility]) - Progression Note    Patient Details  Name: David [name] MRN: [id number] Date of Birth: [birth year]  [facility] CM/SW Contact  Otelia Santee, LCSW Phone Number: 10/18/2023, 4:02 PM  Clinical Narrative:    Spoke with [facility] who informed CSW that they are unable to accept until pt completes radiation tx as they will be unable to transport him to appointments.    Expected Discharge Plan:  (TBD) Barriers to Discharge: Continued Medical Work up  Expected Discharge Plan and Services       Living arrangements for the past 2 months: Single Family Home                                       Social Determinants of Health (SDOH) Interventions SDOH Screenings   Food Insecurity: No Food Insecurity (10/07/2023)  Housing: Low Risk  (10/07/2023)  Transportation Needs: No Transportation Needs (10/07/2023)  Utilities: Not At Risk (10/07/2023)  Depression (PHQ2-9): Low Risk  (01/12/2023)  Financial Resource Strain: Low Risk  (03/24/2023)   Received from [facility]  Recent Concern: Financial Resource Strain - High Risk (02/01/2023)  Social Connections: Unknown (03/07/2023)   Received [facility]  Stress: Patient Declined (03/07/2023)   Received from [facility]  Tobacco Use: High Risk (10/09/2023)    Readmission Risk Interventions     No data to display

## 2023-10-18 NOTE — Progress Notes (Signed)
[facility] PROGRESS NOTE  ZAYIN VALADEZ (DOB: 09-03-1951) ZOX:[id number] PCP: Assunta Found, MD  Brief Narrative: 73 y.o. male smoker with past medical history relevant for history of hep C previously treated with antivirals, HTN, and  Metastatic left kidney clear-cell RCC, s/p Left robotic assisted laparoscopic radical nephrectomy by Dr. Ronne Binning on 08/13/2022 with Pathology: at that time showing 7 cm clear-cell RCC with focal sarcomatoid and rhabdoid features, nuclear grade 4.  Tumor extended into the renal vein and renal sinus fat. PET scan (12/24/2022): Enlarging hypermetabolic left adrenal metastasis.  2 small hypermetabolic soft tissue nodules in the left nephrectomy bed.  Hypermetabolic lytic lesions involving distal left clavicle and left temporal bone.  Indeterminate small hypermetabolic parotid nodules bilaterally. 1/23>Transferred from [facility] for radiation oncology treatment and started on froday with plan for 10 rx last being on next Thursday. Palliative care following. At this time plan for SNF once bed available at [facility].  Subjective: No complaints, seen as he is on his way down for radiation therapy  Objective: BP 118/74 (BP Location: Left Arm)   Pulse 88   Temp 97.6 F (36.4 C)   Resp 18   Ht 5\' 11"  (1.803 m)   Wt 89.2 kg   SpO2 99%   BMI 27.43 kg/m   No distress  Assessment & Plan: Metastatic left kidney RCC: s/p robotic assisted laparoscopic radical nephrectomy 07/27/2022. Has undergone therapy per Dr. Ellin Saba in [place]. - Continue follow up with oncology  Thoracic metastatic disease with pathologic fractures: Seen on CTA chest 1/23 with marked progression since an August PET with four levels affected (T7, T8, T11, T12) pathologic vertebral fractures, destruction of the posterior right 8th rib, and tumor extension into the spinal canal. Associated prevertebral lymph node metastases. Multifocal liver metastases, new since 2023 and individually  up to 2.3 cm.  - Was [facility]RT, initiated 1/24, planning 10 fractions (final Tx 2/6). Next Tx 2/3 on schedule. - Continue steroid, decreased intensity. No ongoing need for prn oxycodone, will continue. Appreciate palliative care following and will follow at SNF after DC.  Dehydration/lactic acidosis: Resolved.     Acute diarrhea: Has been having diarrhea in the setting of his chemotherapy. Negative infectious work up. Improved.     AKI: Due to diarrhea, resolved.  Confirmed to be resolved on 1/30.  Anemia of chronic disease: Hgb stable in high 8's, no bleeding. Monitor intermittently.   Constipation:  - Regular bowel regimen in place, abd exam benign, will continue at this dose. Had BM 2/1.   Hypertension: Stable.  - Continue norvasc 10mg    BPH:  - Continue tamsulosin  Insomnia:  - Continue remeron qHS    [name], MD [facility] [email or web address] 10/18/2023, 10:24 AM

## 2023-10-18 NOTE — Plan of Care (Signed)

## 2023-10-18 NOTE — Progress Notes (Signed)
Physical Therapy Treatment Patient Details Name: David [name] MRN: [id number] DOB: 1951-04-03 Today's Date: 10/18/2023   History of Present Illness Pt is 73 yo male admitted on 10/07/23 with metastatic cancer to bone/spine/liver.  Pt transferred [facility] for [facility] oncology treatment.  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    PT Comments  The patient demonstrates no active movement from both hips and knees and  feet with a ? Trace   in the right toes. MD notified of the noted lack of movement in both LE's which is a change since PT eval 1/27 where he had 3/5 knee ext and dorsiflex, bilaterally. The not did report that patient had not balance  while sitting on bed edge . TReatment today  performed to patient tolerance. Patient's bed placed in the bed/chair position to  allow patient to sit upright without  having to move him to the bed edge as  doing so is quite painful and patient relates  that he is very SOB when sitting bed edge in previous  visits.   Patient able to pull self forward with rails but not balance without UE support. Patient reports back pain in doing so. Patient returned to semi sitting bed position.  Patient endorses that he has LT to both legs.   Continue PT as tolerated.   If plan is discharge home, recommend the following: Two people to help with walking and/or transfers;Two people to help with bathing/dressing/bathroom   Can travel by private vehicle     No  Equipment Recommendations  None recommended by PT    Recommendations for Other Services       Precautions / Restrictions Precautions Precautions: Fall Precaution Comments: Pain - premedicate, paralysis of legs Restrictions Weight Bearing Restrictions Per Provider Order: No     Mobility  Bed Mobility Overal bed mobility: Needs Assistance             General bed mobility comments: placed bed in upright chair position and  tilted forward. Patient able to use both rails to lean forward,  and hold self upright, noted  trunk flexes lower thoracic. Patient has to have UE suport, not able  to let go and balance. Patient performed x 2 and helf self x ~ 2-4 minues each time    Transfers                   General transfer comment: will need mechanical lift    Ambulation/Gait                   Stairs             Wheelchair Mobility     Tilt Bed    Modified Rankin (Stroke Patients Only)       Balance   Sitting-balance support: Bilateral upper extremity supported   Sitting balance - Comments: in bed chair position.                                    Cognition Arousal: Alert Behavior During Therapy: Flat affect Overall Cognitive Status: Within Functional Limits for tasks assessed                                 General Comments: reports gets SOB when  he sits up and appears anxious about that.        Exercises      General Comments        Pertinent Vitals/Pain Pain Assessment Faces Pain Scale: Hurts even more Pain Location: L anterolateral trunk Pain Descriptors / Indicators: Sharp, Grimacing Pain Intervention(s): Monitored during session, Premedicated before session, Limited activity within patient's tolerance    Home Living                          Prior Function            PT Goals (current goals can now be found in the care plan section) Progress towards PT goals: Progressing toward goals    Frequency    Min 1X/week      PT Plan      Co-evaluation              AM-PAC PT "6 Clicks" Mobility   Outcome Measure  Help needed turning from your back to your side while in a flat bed without using bedrails?: Total Help needed moving from lying on your back to sitting on the side of a flat bed without using bedrails?: Total Help needed moving to and from a bed to a chair (including a wheelchair)?:  Total Help needed standing up from a chair using your arms (e.g., wheelchair or bedside chair)?: Total Help needed to walk in hospital room?: Total Help needed climbing 3-5 steps with a railing? : Total 6 Click Score: 6    End of Session   Activity Tolerance: Patient limited by pain;Patient limited by fatigue Patient left: in bed;with call bell/phone within reach;with bed alarm set Nurse Communication: Mobility status;Other (comment);Need for lift equipment PT Visit Diagnosis: Other symptoms and signs involving the nervous system (R29.898);Muscle weakness (generalized) (M62.81)     Time: 5621-3086 PT Time Calculation (min) (ACUTE ONLY): 22 min  Charges:    $Therapeutic Activity: 8-22 mins PT General Charges $$ ACUTE PT VISIT: 1 Visit                     Blanchard Kelch PT Acute Rehabilitation Services Office [phone] Weekend [phone]    Rada Hay 10/18/2023, 4:08 PM

## 2023-10-19 ENCOUNTER — Other Ambulatory Visit: Payer: Self-pay

## 2023-10-19 ENCOUNTER — Ambulatory Visit
Admit: 2023-10-19 | Discharge: 2023-10-19 | Disposition: A | Discharge status: Home / Self Care | Payer: Medicare PPO | Attending: Radiation Oncology | Admitting: Radiation Oncology

## 2023-10-19 DIAGNOSIS — C7951 Secondary malignant neoplasm of bone: Secondary | ICD-10-CM | POA: Diagnosis not present

## 2023-10-19 LAB — RAD ONC ARIA SESSION SUMMARY
Course Elapsed Days: 11
Plan Fractions Treated to Date: 8
Plan Fractions Treated to Date: 8
Plan Prescribed Dose Per Fraction: 3 Gy
Plan Prescribed Dose Per Fraction: 3 Gy
Plan Total Fractions Prescribed: 10
Plan Total Fractions Prescribed: 10
Plan Total Prescribed Dose: 30 Gy
Plan Total Prescribed Dose: 30 Gy
Reference Point Dosage Given to Date: 24 Gy
Reference Point Dosage Given to Date: 24 Gy
Reference Point Session Dosage Given: 3 Gy
Reference Point Session Dosage Given: 3 Gy
Session Number: 8

## 2023-10-19 MED ORDER — ORAL CARE MOUTH RINSE: 15.0000 mL | OROMUCOSAL | Status: DC | PRN

## 2023-10-19 NOTE — Plan of Care (Signed)
  Problem: Education: Goal: Knowledge of General Education information will improve Description: Including pain rating scale, medication(s)/side effects and non-pharmacologic comfort measures Outcome: Progressing   Problem: Clinical Measurements: Goal: Respiratory complications will improve Outcome: Progressing Goal: Cardiovascular complication will be avoided Outcome: Progressing   Problem: Nutrition: Goal: Adequate nutrition will be maintained Outcome: Progressing   Problem: Coping: Goal: Level of anxiety will decrease Outcome: Progressing   Problem: Elimination: Goal: Will not experience complications related to bowel motility Outcome: Progressing Goal: Will not experience complications related to urinary retention Outcome: Progressing   Problem: Pain Managment: Goal: General experience of comfort will improve and/or be controlled Outcome: Progressing

## 2023-10-19 NOTE — Progress Notes (Signed)
 [facility] PROGRESS NOTE  David [name] (DOB: [birth year]) FMW:[id number] PCP: Marvine Rush, MD  Brief Narrative: 73 y.o. male smoker with past medical history relevant for history of hep C previously treated with antivirals, HTN, and  Metastatic left kidney clear-cell RCC, s/p Left robotic assisted laparoscopic radical nephrectomy by Dr. Sherrilee on 08/13/2022 with Pathology: at that time showing 7 cm clear-cell RCC with focal sarcomatoid and rhabdoid features, nuclear grade 4.  Tumor extended into the renal vein and renal sinus fat. PET scan (12/24/2022): Enlarging hypermetabolic left adrenal metastasis.  2 small hypermetabolic soft tissue nodules in the left nephrectomy bed.  Hypermetabolic lytic lesions involving distal left clavicle and left temporal bone.  Indeterminate small hypermetabolic parotid nodules bilaterally. 1/23>Transferred from [facility] for radiation oncology treatment and started on froday with plan for 10 rx last being on next Thursday. Palliative care following. At this time plan for SNF once bed available at [facility].  Subjective: He has no complaints. Says he's noticed no change in weakness of his legs.   Objective: BP 117/73 (BP Location: Left Arm)   Pulse 91   Temp (!) 97.5 F (36.4 C)   Resp 16   Ht 5' 11 (1.803 m)   Wt 89.2 kg   SpO2 97%   BMI 27.43 kg/m   No distress Clear, nonlabored RRR, no MRG or edema LE's with intact sensation, minimal movement, none antigravity.   Assessment & Plan: Metastatic left kidney RCC: s/p robotic assisted laparoscopic radical nephrectomy 07/27/2022. Has undergone therapy per Dr. Katragadda in [place]. - Continue follow up with oncology  Thoracic metastatic disease with pathologic fractures: Seen on CTA chest 1/23 with marked progression since an August PET with four levels affected (T7, T8, T11, T12) pathologic vertebral fractures, destruction of the posterior right 8th rib, and tumor extension into the  spinal canal. Associated prevertebral lymph node metastases. Multifocal liver metastases, new since 2023 and individually up to 2.3 cm.  - Was [facility], initiated 1/24, planning 10 fractions (final Tx 2/6). Next Tx 2/4 on schedule. - Continue steroid, decreased intensity. Continue prn oxycodone . Appreciate palliative care following and will follow at SNF after DC.  Dehydration/lactic acidosis: Resolved.     Acute diarrhea: Has been having diarrhea in the setting of his chemotherapy. Negative infectious work up. Improved.     AKI: Due to diarrhea, resolved.  Confirmed to be resolved on 1/30.  Anemia of chronic disease: Hgb stable in high 8's, no bleeding. Monitor intermittently.   Constipation:  - Regular bowel regimen in place, abd exam benign, will continue at this dose. Had BM 2/1.   Hypertension: Stable.  - Continue norvasc  10mg    BPH:  - Continue tamsulosin   Insomnia:  - Continue remeron  qHS    Bernardino KATHEE Come, MD [facility] [email or web address] 10/19/2023, 11:20 AM

## 2023-10-19 NOTE — Plan of Care (Signed)

## 2023-10-20 ENCOUNTER — Other Ambulatory Visit: Payer: Self-pay

## 2023-10-20 ENCOUNTER — Ambulatory Visit
Admit: 2023-10-20 | Discharge: 2023-10-20 | Disposition: A | Discharge status: Home / Self Care | Payer: Medicare PPO | Attending: Radiation Oncology | Admitting: Radiation Oncology

## 2023-10-20 DIAGNOSIS — C7951 Secondary malignant neoplasm of bone: Secondary | ICD-10-CM | POA: Diagnosis not present

## 2023-10-20 LAB — BASIC METABOLIC PANEL
Anion gap: 8 (ref 5–15)
BUN: 44 mg/dL — ABNORMAL HIGH (ref 8–23)
CO2: 21 mmol/L — ABNORMAL LOW (ref 22–32)
Calcium: 8.3 mg/dL — ABNORMAL LOW (ref 8.9–10.3)
Chloride: 106 mmol/L (ref 98–111)
Creatinine, Ser: 0.66 mg/dL (ref 0.61–1.24)
GFR, Estimated: >60 mL/min (ref >60)
Glucose, Bld: 147 mg/dL — ABNORMAL HIGH (ref 70–99)
Potassium: 4.6 mmol/L (ref 3.5–5.1)
Sodium: 135 mmol/L (ref 135–145)

## 2023-10-20 LAB — CBC
HCT: 23.9 % — ABNORMAL LOW (ref 39.0–52.0)
Hemoglobin: 7.5 g/dL — ABNORMAL LOW (ref 13.0–17.0)
MCH: 35.5 pg — ABNORMAL HIGH (ref 26.0–34.0)
MCHC: 31.4 g/dL (ref 30.0–36.0)
MCV: 113.3 fL — ABNORMAL HIGH (ref 80.0–100.0)
Platelets: 109 10*3/uL — ABNORMAL LOW (ref 150–400)
RBC: 2.11 MIL/uL — ABNORMAL LOW (ref 4.22–5.81)
RDW: 27.9 % — ABNORMAL HIGH (ref 11.5–15.5)
WBC: 4.1 10*3/uL (ref 4.0–10.5)
nRBC: 3.4 % — ABNORMAL HIGH (ref 0.0–0.2)

## 2023-10-20 LAB — RAD ONC ARIA SESSION SUMMARY

## 2023-10-20 NOTE — Plan of Care (Signed)

## 2023-10-20 NOTE — Progress Notes (Signed)
 PT Cancellation Note  Patient Details Name: David [name] MRN: [id number] DOB: 1951/06/10   Cancelled Treatment:    Reason Eval/Treat Not Completed: Fatigue/lethargy limiting ability to participate (pt reports he just returned from radiation and that he's fatigued right now. Will follow.)   [name] PT 10/20/2023  Acute Rehabilitation Services  Office [phone]

## 2023-10-20 NOTE — Progress Notes (Signed)
  Daily Progress Note   Patient Name: David [name]       Date: 10/20/2023 DOB: 04-21-1951  Age: 73 y.o. MRN#: [id number] Attending Physician: Bryn Bernardino NOVAK, MD Primary Care Physician: Marvine Rush, MD Admit Date: 10/07/2023 Length of Stay: 13 days  Reason for Consultation/Follow-up: Establishing goals of care  Subjective:   Subjective:  Reviewed EMR.  In past 24 hours patient has required 2 doses of IV Dilaudid  0.5 mg each as well as one dose of PO oxy IR 5 mg once Patient seen comfortable in bed today   Plan is for patient to continue 10 fractions total of radiation with last one being 2/6.Will likely be attending rehab after completion of radiation therapy.  Objective:   Vital Signs:  BP (!) 140/84 (BP Location: Left Arm)   Pulse 91   Temp (!) 97.5 F (36.4 C) (Oral)   Resp 18   Ht 5' 11 (1.803 m)   Wt 89.2 kg   SpO2 100%   BMI 27.43 kg/m   I personally reviewed recent imaging.   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 on 10/07/2023 for patient to receive radiation collagen treatment.  Palliative medicine team consulted for complex medical decision making.  Recommendations/Plan: # Complex medical decision making/goals of care:  - Patient continued discussions with oncology regarding cancer directed therapies and plans to follow-up with them in the outpatient setting.  Patient to complete 10 fractions of radiation with last 1 being 10/21/2023.  -  Code Status: Full Code  # Symptom management: Patient is receiving these palliative interventions for symptom management with an intent to improve quality of life.   -Pain, in the setting of metastatic clear-cell RCC Pain well-controlled at this time.   Continue as needed oxycodone  5 mg every 4 hours.  # Psychosocial Support:  -chaplain involved   # Discharge Planning: Skilled Nursing Facility for rehab with Palliative care service follow-up  -Patient already referred to inquire [facility] compassionate care for outpatient palliative medicine services, likely for SNF rehab at [facility] soon.   Thank you for allowing the palliative care team to participate in the care [name].  Palliative medicine team will continue to follow along peripherally. Please reach out if acute palliative medicine needs arise.  Low MDM Lonia Serve MD Palliative Care Provider PMT # [phone]  If patient remains symptomatic despite maximum doses, please call PMT at [phone] between 0700 and 1900. Outside of these hours, please call attending, as PMT does not have night coverage.

## 2023-10-20 NOTE — Progress Notes (Signed)
 OT Cancellation Note  Patient Details Name: RAMARI BRAY MRN: [id number] DOB: 06-Apr-1951   Cancelled Treatment:    Reason Eval/Treat Not Completed: Patient declined, no reason specified Patient reporting going to radiation soon. OT to continue to follow and check back as schedule will allow.  Geofm LEYLAND, MS Acute Rehabilitation Department Office# [phone]  10/20/2023, 10:45 AM

## 2023-10-20 NOTE — Progress Notes (Signed)
 [facility] PROGRESS NOTE  David [name] (DOB: 11-29-1950) FMW:[id number] PCP: Marvine Rush, MD  Brief Narrative: 73 y.o. male smoker with past medical history relevant for history of hep C previously treated with antivirals, HTN, and  Metastatic left kidney clear-cell RCC, s/p Left robotic assisted laparoscopic radical nephrectomy by Dr. Sherrilee on 08/13/2022 with Pathology: at that time showing 7 cm clear-cell RCC with focal sarcomatoid and rhabdoid features, nuclear grade 4.  Tumor extended into the renal vein and renal sinus fat. PET scan (12/24/2022): Enlarging hypermetabolic left adrenal metastasis.  2 small hypermetabolic soft tissue nodules in the left nephrectomy bed.  Hypermetabolic lytic lesions involving distal left clavicle and left temporal bone.  Indeterminate small hypermetabolic parotid nodules bilaterally. 1/23>Transferred from [facility] for radiation oncology treatment and started on froday with plan for 10 rx last being on next Thursday. Palliative care following. At this time plan for SNF once bed available at [facility].  Subjective: Ate all of breakfast, french toast. Having stable discomfort on left chest related to radiation. Feels fine. Specific denies any changes to weakness.   Objective: BP (!) 140/84 (BP Location: Left Arm)   Pulse 91   Temp (!) 97.5 F (36.4 C) (Oral)   Resp 18   Ht 5' 11 (1.803 m)   Wt 89.2 kg   SpO2 100%   BMI 27.43 kg/m   No distress Nonlabored No pitting edema, JVD. RRR.  Intact sensation and stable motor deficits in LE's.   Assessment & Plan: Metastatic left kidney RCC: s/p robotic assisted laparoscopic radical nephrectomy 07/27/2022. Has undergone therapy per Dr. Katragadda in [place]. - Continue follow up with oncology  Thoracic metastatic disease with pathologic fractures: Seen on CTA chest 1/23 with marked progression since an August PET with four levels affected (T7, T8, T11, T12) pathologic vertebral fractures,  destruction of the posterior right 8th rib, and tumor extension into the spinal canal. Associated prevertebral lymph node metastases. Multifocal liver metastases, new since 2023 and individually up to 2.3 cm.  - Was [facility]RT, initiated 1/24, planning 10 fractions (final Tx 2/6). Next Tx 2/5 on schedule. - Continue steroid. Continue prn oxycodone . Appreciate palliative care following and will follow at SNF after DC.  Dehydration/lactic acidosis: Resolved.     Acute diarrhea: Has been having diarrhea in the setting of his chemotherapy. Negative infectious work up. Resolved.   AKI: Due to diarrhea, resolved.  Confirmed to be resolved on 1/30.  Anemia of chronic disease: Hgb stable in high 8's, no bleeding. Monitor intermittently.   Constipation:  - Regular bowel regimen in place, abd exam benign, and symptoms appear to be stabilizing, will continue at this dose.    Hypertension: Stable.  - Continue norvasc  10mg    BPH:  - Continue tamsulosin   Insomnia:  - Continue remeron  qHS    Bernardino KATHEE Come, MD [facility] [email or web address] 10/20/2023, 9:12 AM

## 2023-10-20 NOTE — Plan of Care (Signed)

## 2023-10-21 ENCOUNTER — Ambulatory Visit (HOSPITAL_COMMUNITY): Payer: Medicare PPO | Admitting: Physical Therapy

## 2023-10-21 ENCOUNTER — Other Ambulatory Visit (HOSPITAL_COMMUNITY): Payer: Self-pay

## 2023-10-21 ENCOUNTER — Ambulatory Visit: Payer: Medicare PPO

## 2023-10-21 ENCOUNTER — Ambulatory Visit
Admit: 2023-10-21 | Discharge: 2023-10-21 | Disposition: A | Discharge status: Home / Self Care | Payer: Medicare PPO | Attending: Radiation Oncology | Admitting: Radiation Oncology

## 2023-10-21 ENCOUNTER — Telehealth: Payer: Self-pay

## 2023-10-21 ENCOUNTER — Inpatient Hospital Stay (HOSPITAL_COMMUNITY): Payer: Medicare PPO

## 2023-10-21 DIAGNOSIS — C7951 Secondary malignant neoplasm of bone: Secondary | ICD-10-CM | POA: Diagnosis not present

## 2023-10-21 LAB — BLOOD GAS, ARTERIAL
Acid-base deficit: 0.2 mmol/L (ref 0.0–2.0)
Bicarbonate: 21.8 mmol/L (ref 20.0–28.0)
Drawn by: 20012
FIO2: 28 %
O2 Content: 2 L/min
O2 Saturation: 100 %
Patient temperature: 36.4
pCO2 arterial: 27 mm[Hg] — ABNORMAL LOW (ref 32–48)
pH, Arterial: 7.51 — ABNORMAL HIGH (ref 7.35–7.45)
pO2, Arterial: 86 mm[Hg] (ref 83–108)

## 2023-10-21 LAB — BASIC METABOLIC PANEL
Anion gap: 9 (ref 5–15)
BUN: 39 mg/dL — ABNORMAL HIGH (ref 8–23)
CO2: 22 mmol/L (ref 22–32)
Calcium: 8.4 mg/dL — ABNORMAL LOW (ref 8.9–10.3)
Chloride: 105 mmol/L (ref 98–111)
Creatinine, Ser: 0.77 mg/dL (ref 0.61–1.24)
GFR, Estimated: >60 mL/min (ref >60)
Glucose, Bld: 186 mg/dL — ABNORMAL HIGH (ref 70–99)
Potassium: 5.3 mmol/L — ABNORMAL HIGH (ref 3.5–5.1)
Sodium: 136 mmol/L (ref 135–145)

## 2023-10-21 LAB — CBC
HCT: 22.1 % — ABNORMAL LOW (ref 39.0–52.0)
Hemoglobin: 7 g/dL — ABNORMAL LOW (ref 13.0–17.0)
MCH: 35.5 pg — ABNORMAL HIGH (ref 26.0–34.0)
MCHC: 31.7 g/dL (ref 30.0–36.0)
MCV: 112.2 fL — ABNORMAL HIGH (ref 80.0–100.0)
Platelets: 101 10*3/uL — ABNORMAL LOW (ref 150–400)
RBC: 1.97 MIL/uL — ABNORMAL LOW (ref 4.22–5.81)
RDW: 27.4 % — ABNORMAL HIGH (ref 11.5–15.5)
WBC: 3 10*3/uL — ABNORMAL LOW (ref 4.0–10.5)
nRBC: 2.7 % — ABNORMAL HIGH (ref 0.0–0.2)

## 2023-10-21 LAB — BRAIN NATRIURETIC PEPTIDE: B Natriuretic Peptide: 136 pg/mL — ABNORMAL HIGH (ref 0.0–100.0)

## 2023-10-21 MED ORDER — ALBUTEROL SULFATE (2.5 MG/3ML) 0.083% IN NEBU
INHALATION_SOLUTION | RESPIRATORY_TRACT | Status: AC
  Filled 2023-10-21: qty 3

## 2023-10-21 MED ORDER — CHLORHEXIDINE GLUCONATE CLOTH 2 % EX PADS
6.0000 | MEDICATED_PAD | Freq: Every day | CUTANEOUS | Status: DC
  Administered 2023-10-23 (×2): 6 via TOPICAL

## 2023-10-21 MED ORDER — ALBUTEROL SULFATE (2.5 MG/3ML) 0.083% IN NEBU
2.5000 mg | INHALATION_SOLUTION | Freq: Once | RESPIRATORY_TRACT | Status: AC | PRN
  Administered 2023-10-21: 2.5 mg via RESPIRATORY_TRACT

## 2023-10-21 NOTE — Progress Notes (Signed)
  Daily Progress Note   Patient Name: David [name]       Date: 10/21/2023 DOB: 14-Jul-1951  Age: 73 y.o. MRN#: [id number] Attending Physician: Christobal Guadalajara, MD Primary Care Physician: Marvine Rush, MD Admit Date: 10/07/2023 Length of Stay: 14 days  Reason for Consultation/Follow-up: Establishing goals of care  Subjective:   Subjective:  Awake alert, sister at bedside, appears mildly dyspneic, but denies acute dyspnea.    Objective:   Vital Signs:  BP 114/71   Pulse (!) 108   Temp (!) 97.2 F (36.2 C)   Resp 16   Ht 5' 11 (1.803 m)   Wt 89.2 kg   SpO2 100%   BMI 27.43 kg/m   I personally reviewed recent imaging.  CT chest ordered, pending CXR from today noted.   Assessment & Plan:   Assessment: Patient is 73 year old male with past medical history of tobacco use, hep C previously treated with antivirals, hypertension, and metastatic left kidney clear-cell RCC status post robotic assisted laparoscopic radical nephrectomy on 08/13/2022 who was transferred from [facility] on 10/07/2023 for patient to receive radiation collagen treatment.  Palliative medicine team consulted for complex medical decision making.  Recommendations/Plan: # Complex medical decision making/goals of care:  - Patient continued discussions with oncology regarding cancer directed therapies and plans to follow-up with them in the outpatient setting.  Patient canceled today's last dose of radiation due to dyspnea.   -  Code Status: Limited: Do not attempt resuscitation (DNR) -DNR-LIMITED -Do Not Intubate/DNI  Goals of care discussions undertaken with patient and his sister David [name] who is currently at the bedside. Inter disciplinary discussions have also taken place between hospital medicine, oncology and rad-onc colleagues. Overall, patient is now DNR DNI, however, his goals are not for hospice/comfort-care-only at this time. Patient remains hopeful that he will be able to complete radiation and continue  on with the original plan of SNF rehab with palliative, with close outpatient oncology follow up.  # Symptom management: Patient is receiving these palliative interventions for symptom management with an intent to improve quality of life.   -Pain and dyspnea in the setting of metastatic clear-cell RCC   Continue as needed oxycodone  5 mg every 4 hours PRN and monitor.   # Psychosocial Support:  -chaplain involved   # Discharge Planning: Skilled Nursing Facility for rehab with Palliative care service follow-up  -Patient already referred to inquire [facility] compassionate care for outpatient palliative medicine services, likely for SNF rehab at [facility] when medically stable.   Thank you for allowing the palliative care team to participate in the care David [name].  Palliative medicine team will continue to follow along peripherally. Please reach out if acute palliative medicine needs arise.  high MDM Lonia Serve MD Palliative Care Provider PMT # [phone]  If patient remains symptomatic despite maximum doses, please call PMT at [phone] between 0700 and 1900. Outside of these hours, please call attending, as PMT does not have night coverage.

## 2023-10-21 NOTE — Telephone Encounter (Signed)
 Called to check on pt. after he canceled his last Rad TX apt today.   Patient identity verified x2. Patient states He is busy organizing his living situation at this time, but does want his last TX. He will call to reschedule it asap.  This concludes the interaction.  Rosaline Minerva, LPN

## 2023-10-21 NOTE — Progress Notes (Addendum)
 Occupational Therapy Visit Patient Details Name: David [name] MRN: [id number] DOB: 05/04/1951 Today's Date: 10/21/2023   History of present illness Pt is 73 yo male admitted on 10/07/23 with metastatic cancer to bone/spine/liver.  Pt transferred to [facility] from [facility] for [facility] oncology treatment.  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   OT comments  Visit Only. Pt seen and expressed frustration and anger re: asking for breathing help for DAYS and no help coming.  Pt on Room and air and satting at 82-83% per portable pulse-ox. Nursing team notified and to room.  Pt satting 100% on dynamap pulse-ox, but remaining tachyphemic and appearing distressed.  Pt initially willing to try hoyer to recliner to see if change in position assisted with lung expansion, however Nursing held this due to pt's medical level.  Placed pt on 4L by nursing with pt calming breathing and HR lowered to 109.    Pt placed on       If plan is discharge home, recommend the following:      Equipment Recommendations  Hoyer lift    Recommendations for Other Services      Precautions / Restrictions         Mobility Bed Mobility    Deferred                 Transfers                         Balance                                           ADL either performed or assessed with clinical judgement   ADL                                              Extremity/Trunk Assessment              Vision       Perception     Praxis      Cognition                                                Exercises      Shoulder Instructions       General Comments      Pertinent Vitals/ Pain          Home Living                                          Prior Functioning/Environment              Frequency           Progress Toward  Goals  OT Goals(current goals can now be found in the care plan section)  Progress towards OT goals: Not progressing toward goals - comment     Plan      Co-evaluation  AM-PAC OT 6 Clicks Daily Activity     Outcome Measure   Help from another person eating meals?: A Little Help from another person taking care of personal grooming?: A Little Help from another person toileting, which includes using toliet, bedpan, or urinal?: Total Help from another person bathing (including washing, rinsing, drying)?: Total Help from another person to put on and taking off regular upper body clothing?: Total Help from another person to put on and taking off regular lower body clothing?: Total 6 Click Score: 10    End of Session        Activity Tolerance     Patient Left     Nurse Communication          Time: 8966-8954 OT Time Calculation (min): 12 min  Charges: OT General Charges $OT Visit: 1 Visit (Non-billed. Assisted pt and nursing.)  David, David [name] Acute Rehab Services Office: [phone] 10/21/2023   David [name] 10/21/2023, 10:49 AM

## 2023-10-21 NOTE — Progress Notes (Signed)
 PT Cancellation Note  Patient Details Name: David [name] MRN: [id number] DOB: 1951/05/17   Cancelled Treatment:    Reason Eval/Treat Not Completed: Medical issues which prohibited therapy (Pt resting in bed, stated he's had trouble breathing this morning, during conversation SpO2 dropped to 85% on 2L O2 and HR briefly elevated to 170.  Nurse notified.)   [name] PT 10/21/2023  Acute Rehabilitation Services  Office [phone]

## 2023-10-21 NOTE — Progress Notes (Signed)
 PROGRESS NOTE David [name]  FMW:[id number] DOB: 30-Oct-1950 DOA: 10/07/2023 PCP: Marvine Rush, MD  Brief Narrative/Hospital Course:  73 y.o. male smoker with past medical history relevant for history of hep C previously treated with antivirals, HTN, and  Metastatic left kidney clear-cell RCC, s/p Left robotic assisted laparoscopic radical nephrectomy by Dr. Sherrilee on 08/13/2022 with Pathology: at that time showing 7 cm clear-cell RCC with focal sarcomatoid and rhabdoid features, nuclear grade 4.  Tumor extended into the renal vein and renal sinus fat. PET scan (12/24/2022): Enlarging hypermetabolic left adrenal metastasis.  2 small hypermetabolic soft tissue nodules in the left nephrectomy bed.  Hypermetabolic lytic lesions involving distal left clavicle and left temporal bone.  Indeterminate small hypermetabolic parotid nodules bilaterally. 1/23>Transferred from [facility] for radiation oncology treatment and started on froday with plan for 10 rx last being on next Thursday. Palliative care following. At this time plan for SNF once bed available at [facility]   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 Has undergone therapy per Dr. Katragadda in [place]. Continue follow up with oncology   Thoracic metastatic disease with pathologic fractures:  Seen on CTA chest 1/23 with marked  progression since an August PET with four levels affected (T7, T8, T11, T12) pathologic vertebral fractures, destruction of the posterior right 8th rib, and tumor extension into the spinal canal. Associated prevertebral lymph node metastases. Multifocal liver metastases, new since 2023 and individually up to 2.3 cm.  -[facility]RT, initiated 1/24, planning 10 fractions (final Tx 2/6). Next Tx 2/6 on schedule. Continue steroid. Continue prn oxycodone . Appreciate palliative care following and will follow at SNF after DC.   Shortness of breath/chest pain Acute hypoxic respiratory failure: Patient reports shortness of breath for several days this morning needing oxygen.  Chest x-ray obtained shows large right paraspinal mass, otherwise unremarkable.  No PE on the CT angio chest 1/23 but with severe spinal metastasis. Obtain CT angio chest given high risk for VTE and high suspicion-of note he has been on prophylactic Lovenox .  Dehydration/lactic acidosis:. Resolved.     Acute diarrhea: Has been having diarrhea in the setting of his chemotherapy. Negative infectious work up. Resolved.   AKI: Due to diarrhea.  Resolved.     Anemia of chronic disease AND malignancy: Hgb stable in high 8's, no bleeding. Monitor intermittently.  Recent Labs  Lab 10/20/23 1140  HGB 7.5*  HCT 23.9*    Constipation:   Regular bowel regimen in place, abd exam benign, and symptoms appear to be stabilizing, will continue at this dose.    Hypertension:  BP stable continue home amlodipine  10.    BPH:  Continue tamsulosin    Insomnia:  Continue remeron  qHS  Moderate malnutrition: Augment diet as below.   Nutrition Problem: Moderate Malnutrition Etiology: chronic illness (metastatic left kidney clear cell renal cell cancer  with liver and spine mets) Signs/Symptoms: moderate fat depletion, moderate muscle depletion, percent weight loss (20.5% loss in 8 months) Percent weight loss: 20.5 %  (in 8 months) Interventions: Refer to RD note for recommendations, Boost Plus Goals of care: Overall prognosis does not appear bright, patient wishes to be full code at this time palliative care has seen the patient  Goals of care: Patient with hypoxia, he reports he has been dealing with cancer for a while and feels tired.His sister arrived at the bedside  and all agree for DNR DNI-they want to look into hospice.I have alerted palliative care and also patient's radiation oncologist and medical oncologist.  DVT prophylaxis: enoxaparin  (LOVENOX ) injection 40 mg Start: 10/14/23 2200 SCDs Start: 10/07/23 1559 Place TED hose Start: 10/07/23 1559 Code Status:   Code Status: Full Code Family Communication: plan of care discussed with patient at bedside. Patient status is: Remains hospitalized because of severity of illness Level of care: Med-Surg   Dispo: The patient is from: home            Anticipated disposition: TBD Objective: Vitals last 24 hrs: Vitals:   10/20/23 0345 10/20/23 1950 10/21/23 0519 10/21/23 1050  BP: (!) 140/84 117/73 114/71   Pulse: 91 (!) 104 (!) 102   Resp: 18 17 16    Temp: (!) 97.5 F (36.4 C) 97.7 F (36.5 C) (!) 97.5 F (36.4 C)   TempSrc: Oral Oral Axillary   SpO2: 100% 100% 100% 100%  Weight:      Height:       Weight change:   Physical Examination: General exam: alert awake,at baseline, older than stated age HEENT:Oral mucosa moist, Ear/Nose WNL grossly Respiratory system: Bilaterally clear BS,no use of accessory muscle Cardiovascular system: S1 & S2 +, No JVD. Gastrointestinal system: Abdomen soft,NT,ND, BS+ Nervous System: Alert, awake, moving all extremities,and following commands. Extremities: LE edema neg,distal peripheral pulses palpable and warm.  Skin: No rashes,no icterus. MSK: Normal muscle bulk,tone, power   Medications reviewed:  Scheduled Meds:  albuterol        amLODipine   10 mg Oral Daily   Chlorhexidine  Gluconate Cloth  6 each  Topical Daily   dexamethasone   6 mg Oral Daily   enoxaparin  (LOVENOX ) injection  40 mg Subcutaneous Q24H   lactose free nutrition  237 mL Oral BID BM   mirtazapine   7.5 mg Oral QHS   polyethylene glycol  17 g Oral Daily   senna-docusate  2 tablet Oral QHS   sodium chloride  flush  10-40 mL Intracatheter Q12H   sodium chloride  flush  3 mL Intravenous Q12H   sodium chloride  flush  3 mL Intravenous Q12H   tamsulosin   0.4 mg Oral Daily   Continuous Infusions:  sodium chloride         Diet Order             Diet regular Room service appropriate? Yes; Fluid consistency: Thin  Diet effective now                   Intake/Output Summary (Last 24 hours) at 10/21/2023 1053 Last data filed at 10/21/2023 0800 Gross per 24 hour  Intake 240 ml  Output 2075 ml  Net -1835 ml   Net IO Since Admission: -12,740.07 mL [10/21/23 1053]  Wt Readings from Last 3 Encounters:  10/14/23 89.2 kg  09/30/23 89.8 kg  09/06/23 89.8 kg     Unresulted Labs (From admission, onward)     Start     Ordered  10/21/23 1054  Blood gas, arterial  Once,   R        10/21/23 1053   10/21/23 1054  Basic metabolic panel  ONCE - STAT,   STAT       Question:  Specimen collection method  Answer:  IV Team=IV Team collect   10/21/23 1053   10/21/23 1054  CBC  ONCE - STAT,   STAT       Question:  Specimen collection method  Answer:  IV Team=IV Team collect   10/21/23 1053   10/21/23 1054  Brain natriuretic peptide  ONCE - URGENT,   URGENT       Question:  Specimen collection method  Answer:  IV Team=IV Team collect   10/21/23 1053           Data Reviewed: I have personally reviewed following labs and imaging studies CBC: Recent Labs  Lab 10/20/23 1140  WBC 4.1  HGB 7.5*  HCT 23.9*  MCV 113.3*  PLT 109*  Basic Metabolic Panel:  Recent Labs  Lab 10/20/23 0856  NA 135  K 4.6  CL 106  CO2 21*  GLUCOSE 147*  BUN 44*  CREATININE 0.66  CALCIUM 8.3*  GFR: Estimated Creatinine Clearance: 90.2 mL/min  (by C-G formula based on SCr of 0.66 mg/dL). No results found for this or any previous visit (from the past 240 hours).  Antimicrobials/Microbiology: Anti-infectives (From admission, onward)    None         Component Value Date/Time   SDES BLOOD LEFT ARM 10/07/2023 1031   SPECREQUEST  10/07/2023 1031    BOTTLES DRAWN AEROBIC ONLY Blood Culture results may not be optimal due to an inadequate volume of blood received in culture bottles   CULT  10/07/2023 1031    NO GROWTH 5 DAYS Performed at [facility], [address], [place], [place] [address]    REPTSTATUS 10/12/2023 FINAL 10/07/2023 1031     Radiology Studies: No results found.   LOS: 14 days   Total time spent in review of labs and imaging, patient evaluation, formulation of plan, documentation and communication with family: 50 minutes  Mennie LAMY, MD  [facility]  10/21/2023, 10:53 AM

## 2023-10-21 NOTE — Progress Notes (Signed)
 Nutrition Follow-up  DOCUMENTATION CODES:   Non-severe (moderate) malnutrition in context of chronic illness  INTERVENTION:  - Regular diet.  - Boost Plus po BID, each supplement provides 360 kcal and 14 grams of protein - Encourage intake at all meals and of supplements. - Once complete with radiation therapy, recommend Multivitamin with minerals daily, 500mg  vitamin C and 220mg  zinc x14 days to support wound healing.  - Monitor weight trends.   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 *ongoing  GOAL:   Patient will meet greater than or equal to 90% of their needs *progressing  MONITOR:   PO intake, Supplement acceptance, Weight trends  REASON FOR ASSESSMENT:   Consult Assessment of nutrition requirement/status  ASSESSMENT:   73 y.o. 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  Patient reports eating so-so and appetite being so-so. When inquiring further patient reports he is likely only consuming in total about 1.5 meals per day. Reports his food is often cold as his tray comes and he will be working with therapy or another provider and unable to eat it while its hot.  Despite these issues, patient documented to be consuming 25-100% of meals with average of 78%. Also reports he has been getting Boost twice daily and drinking it but notes he really only likes it ice cold and it is often given to him room temperature.    He appeared very frustrated with these issues. He has been getting automatic trays which may be some of the issue with timing but he  reports not wanting to change this.  Discussed that if his food is ever cold or he gets something he doesn't like, he can call down and place an order for another tray. Also reminded patient he can get ice with his Boost to help with making it cold. Added that patient prefers it very cold to order itself as well and informed patient of this. Patient endorsed understanding. Sister at bedside reports he will be moving to the 4th floor and is hopeful this will help with some of his frustrations with care during admission.    Medications reviewed and include: Remeron , Miralax , Senokot  Labs reviewed:  -   Diet Order:   Diet Order             Diet regular Room service appropriate? Yes; Fluid consistency: Thin  Diet effective now                   EDUCATION NEEDS:  Education needs have been addressed  Skin:  Skin Assessment: Skin Integrity Issues: Skin Integrity Issues:: Stage III Stage III: Left Buttocks - per CWOCN  Last BM:  2/6 - type 6  Height:  Ht Readings from Last 1 Encounters:  10/07/23 5' 11 (1.803 m)   Weight:  Wt Readings from Last 1 Encounters:  10/14/23 89.2 kg    BMI:  Body mass index is 27.43 kg/m.  Estimated Nutritional Needs:  Kcal:  2000-2200 kcals Protein:  100-115 grams Fluid:  >/= 2L    Trude Ned RD, LDN Contact via Secure Chat.

## 2023-10-22 ENCOUNTER — Other Ambulatory Visit: Payer: Self-pay

## 2023-10-22 ENCOUNTER — Inpatient Hospital Stay (HOSPITAL_COMMUNITY): Payer: Medicare PPO

## 2023-10-22 ENCOUNTER — Ambulatory Visit
Admit: 2023-10-22 | Discharge: 2023-10-22 | Disposition: A | Discharge status: Home / Self Care | Payer: Medicare PPO | Attending: Radiation Oncology | Admitting: Radiation Oncology

## 2023-10-22 ENCOUNTER — Ambulatory Visit
Admit: 2023-10-22 | Discharge: 2023-10-22 | Disposition: A | Discharge status: Home / Self Care | Payer: Medicare PPO | Attending: Radiation Oncology

## 2023-10-22 DIAGNOSIS — C7951 Secondary malignant neoplasm of bone: Secondary | ICD-10-CM | POA: Diagnosis not present

## 2023-10-22 DIAGNOSIS — R0602 Shortness of breath: Secondary | ICD-10-CM

## 2023-10-22 LAB — RAD ONC ARIA SESSION SUMMARY
Course Elapsed Days: 14
Plan Fractions Treated to Date: 10
Plan Fractions Treated to Date: 10
Plan Prescribed Dose Per Fraction: 3 Gy
Plan Prescribed Dose Per Fraction: 3 Gy
Plan Total Fractions Prescribed: 10
Plan Total Fractions Prescribed: 10
Plan Total Prescribed Dose: 30 Gy
Plan Total Prescribed Dose: 30 Gy
Reference Point Dosage Given to Date: 30 Gy
Reference Point Dosage Given to Date: 30 Gy
Reference Point Session Dosage Given: 3 Gy
Reference Point Session Dosage Given: 3 Gy
Session Number: 10

## 2023-10-22 LAB — CBC
HCT: 22.9 % — ABNORMAL LOW (ref 39.0–52.0)
Hemoglobin: 7.1 g/dL — ABNORMAL LOW (ref 13.0–17.0)
MCH: 34.6 pg — ABNORMAL HIGH (ref 26.0–34.0)
MCHC: 31 g/dL (ref 30.0–36.0)
MCV: 111.7 fL — ABNORMAL HIGH (ref 80.0–100.0)
Platelets: 96 10*3/uL — ABNORMAL LOW (ref 150–400)
RBC: 2.05 MIL/uL — ABNORMAL LOW (ref 4.22–5.81)
RDW: 27.5 % — ABNORMAL HIGH (ref 11.5–15.5)
WBC: 3 10*3/uL — ABNORMAL LOW (ref 4.0–10.5)
nRBC: 3.4 % — ABNORMAL HIGH (ref 0.0–0.2)

## 2023-10-22 LAB — BASIC METABOLIC PANEL
Anion gap: 5 (ref 5–15)
BUN: 30 mg/dL — ABNORMAL HIGH (ref 8–23)
CO2: 19 mmol/L — ABNORMAL LOW (ref 22–32)
Calcium: 6.7 mg/dL — ABNORMAL LOW (ref 8.9–10.3)
Chloride: 115 mmol/L — ABNORMAL HIGH (ref 98–111)
Creatinine, Ser: 0.54 mg/dL — ABNORMAL LOW (ref 0.61–1.24)
GFR, Estimated: >60 mL/min (ref >60)
Glucose, Bld: 85 mg/dL (ref 70–99)
Potassium: 3.9 mmol/L (ref 3.5–5.1)
Sodium: 139 mmol/L (ref 135–145)

## 2023-10-22 MED ORDER — IOHEXOL 350 MG/ML SOLN
75.0000 mL | Freq: Once | INTRAVENOUS | Status: AC | PRN
  Administered 2023-10-22: 75 mL via INTRAVENOUS

## 2023-10-22 NOTE — Progress Notes (Signed)
 OT Sign Off Note  Patient Details Name: David [name] MRN: [id number] DOB: 01/25/1951   Cancelled Treatment:    Reason Eval/Treat Not Completed: Medical issues which prohibited therapy: Social work reached out to PT and OT notifying that pt will transition to residential hospice care. OT to sign off.   [name] 10/22/2023, 12:37 PM

## 2023-10-22 NOTE — Progress Notes (Signed)
 PROGRESS NOTE David [name]  FMW:[id number] DOB: 03/15/1951 DOA: 10/07/2023 PCP: Marvine Rush, MD  Brief Narrative/Hospital Course:  73 y.o. male smoker with past medical history relevant for history of hep C previously treated with antivirals, HTN, and  Metastatic left kidney clear-cell RCC, s/p Left robotic assisted laparoscopic radical nephrectomy by Dr. Sherrilee on 08/13/2022 with Pathology: at that time showing 7 cm clear-cell RCC with focal sarcomatoid and rhabdoid features, nuclear grade 4.  Tumor extended into the renal vein and renal sinus fat. PET scan (12/24/2022): Enlarging hypermetabolic left adrenal metastasis.  2 small hypermetabolic soft tissue nodules in the left nephrectomy bed.  Hypermetabolic lytic lesions involving distal left clavicle and left temporal bone.  Indeterminate small hypermetabolic parotid nodules bilaterally. 1/23>Transferred from [facility] for radiation oncology treatment and started on froday with plan for 10 rx last being on next Thursday. Palliative care following. At this time plan for SNF once bed available at [facility]. Patient noted to be more short of breath hypoxic needing supplemental oxygen on 2/6 discussed extensively with the patient and his sister at the bedside he agreed for DNR, recommending hospice based open discussed with Dr. Joannie patient and family would like to proceed with SNF with rehab and outpatient palliative care follow-up.   Subjective: Patient seen and examined Ill-appearing, older than is, in mild distress but reports he feels about the same like yesterday No chest pain Overnight afebrile BP stable on 2 L nasal cannula  Last night Labs showed mild hyperkalemia and drifting hemoglobin repeat labs pending  CT angio pending   Assessment and Plan: Principal Problem:   Metastatic cancer to bone [facility]) Active Problems:   Clear cell renal cell carcinoma, left (HCC)   Diarrhea in adult patient   Seizure disorder (HCC)    AKI (acute kidney injury) (HCC)   Malnutrition of moderate degree   Palliative care encounter   Metastasis to liver [facility])   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 Has undergone therapy per Dr. Katragadda in [place]. Dscussed with Dr. Katragadda on 10/21/23  he has advised hospice   Thoracic metastatic disease with pathologic fractures:  Seen on CTA chest 1/23 with marked progression since an August PET with four levels affected (T7, T8, T11, T12) pathologic vertebral fractures, destruction of the posterior right 8th rib, and tumor extension into the spinal canal. Associated prevertebral lymph node metastases. Multifocal liver metastases, new since 2023 and individually up to 2.3 cm.  -Was [facility] from [facility] for XRT, initiated 1/24, planning 10 fractions (final Tx 2/6). Next Tx 2/6 on schedule. Continue steroid. Continue prn oxycodone . Appreciate palliative care following and will follow at SNF after DC.   Shortness of breath/chest pain Acute hypoxic respiratory failure: Patient reports shortness of breath for several days and has been needing oxygen since 2/6 evening, question radiation pneumonitis, chest x-ray shows right hilar mass which is being treated by radiation oncology.  CTA chest pending to rule out PE and other etiology.   Dehydration/lactic acidosis:. Resolved.     Acute diarrhea: Has been having diarrhea in the setting of his chemotherapy. Negative infectious work up. Resolved.   AKI: Due to diarrhea.  Resolved.     Anemia of chronic disease AND malignancy: Hgb stable in high 8's, no bleeding. Monitor intermittently.  Transfuse if less than 7 g Recent Labs  Lab 10/20/23 1140 10/21/23 1850  HGB 7.5* 7.0*  HCT 23.9* 22.1*   Thrombocytopenia Leukopenia : Patient platelet count hemoglobin as well as WBC count  downtrending, on Lovenox , monitor closely, repeat CBC pending today  Recent Labs  Lab 10/20/23 1140 10/21/23 1850  HGB 7.5* 7.0*  HCT 23.9* 22.1*  WBC 4.1 3.0*  PLT 109* 101*    Constipation:  Regular bowel regimen in place, abd exam benign, and symptoms appear to be stabilizing, will continue at this dose.    Hypertension:  BP stable continue home amlodipine  10.    BPH:  Continue tamsulosin    Insomnia:  Continue remeron  qHS  Moderate malnutrition: Augment diet as below.   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) Signs/Symptoms: moderate fat depletion, moderate muscle depletion, percent weight loss (20.5% loss in 8 months) Percent weight loss: 20.5 % (in 8 months) Interventions: Refer to RD note for recommendations, Boost Plus Goals of care: Overall prognosis does not appear bright, patient wishes to be full code at this time palliative care has seen the patient  Goals of care: Patient with hypoxia, he reports he has been dealing with cancer for a while and feels tired.His sister arrived at the bedside  and all agreed for DNR DNI on 10/21/23 evening and at this time they want to proceed with rehabilitation with outpatient palliative care follow-up.  They are aware about recommendation for hospice.  Palliative care continues to follow along. Addendum: updatedBY  palliative care at this time given patient's overall condition conditions, looking into inpatient hospice REFERRAL  DVT prophylaxis: enoxaparin  (LOVENOX ) injection 40 mg Start: 10/14/23 2200 SCDs Start: 10/07/23 1559 Place TED hose Start: 10/07/23 1559 Code Status:   Code Status: Limited: Do not attempt resuscitation (DNR) -DNR-LIMITED -Do Not Intubate/DNI  Family Communication: plan of care discussed with patient at bedside. Patient status is: Remains hospitalized because of severity of illness Level of care:  Progressive   Dispo: The patient is from: home            Anticipated disposition: TBD Objective: Vitals last 24 hrs: Vitals:   10/21/23 1841 10/21/23 2230 10/22/23 0147 10/22/23 0456  BP: 114/73 125/71 120/69 121/68  Pulse: (!) 46 98 96 95  Resp: 16 18 20 18   Temp: 98 F (36.7 C) 97.7 F (36.5 C) 98.4 F (36.9 C) 97.7 F (36.5 C)  TempSrc: Oral Oral Oral Oral  SpO2: 97% 100% 100% 100%  Weight:      Height:       Weight change:   Physical Examination: General exam: alert awake, oriented at baseline, older than stated age HEENT:Oral mucosa moist, Ear/Nose WNL grossly Respiratory system: Bilaterally crackles and conducted sounds present,no use of accessory muscle Cardiovascular system: S1 & S2 +, No JVD. Gastrointestinal system: Abdomen soft,NT,ND, BS+ Nervous System: Alert, awake, moving all extremities,and following commands. Extremities: LE edema +, distal peripheral pulses palpable and warm.  Skin: No rashes,no icterus. MSK: Normal muscle bulk,tone, power   Medications reviewed:  Scheduled Meds:  amLODipine   10 mg Oral Daily   Chlorhexidine  Gluconate Cloth  6 each Topical Q2200   dexamethasone   6 mg Oral Daily   enoxaparin  (LOVENOX ) injection  40 mg Subcutaneous Q24H   lactose free nutrition  237 mL Oral BID BM   mirtazapine   7.5 mg Oral QHS   polyethylene glycol  17 g Oral Daily   senna-docusate  2 tablet Oral QHS   sodium chloride  flush  10-40  mL Intracatheter Q12H   sodium chloride  flush  3 mL Intravenous Q12H   sodium chloride  flush  3 mL Intravenous Q12H   tamsulosin   0.4 mg Oral Daily  Continuous Infusions:  sodium chloride       Diet Order             Diet regular Room service appropriate? Yes; Fluid consistency: Thin  Diet effective now                   Intake/Output Summary (Last 24 hours) at 10/22/2023 0808 Last data filed at 10/22/2023 0500 Gross per 24 hour  Intake 240 ml  Output 400 ml  Net -160 ml   Net IO Since Admission: -12,900.07 mL  [10/22/23 0808]  Wt Readings from Last 3 Encounters:  10/14/23 89.2 kg  09/30/23 89.8 kg  09/06/23 89.8 kg    Unresulted Labs (From admission, onward)     Start     Ordered   10/22/23 0807  CBC  ONCE - STAT,   STAT       Question:  Specimen collection method  Answer:  IV Team=IV Team collect   10/22/23 0807   10/22/23 0807  Basic metabolic panel  Once,   R       Question:  Specimen collection method  Answer:  IV Team=IV Team collect   10/22/23 0807   10/22/23 0807  Type and screen [facility]  ONCE - STAT,   STAT       Comments: [facility]    10/22/23 0807          Data Reviewed: I have personally reviewed following labs and imaging studies CBC: Recent Labs  Lab 10/20/23 1140 10/21/23 1850  WBC 4.1 3.0*  HGB 7.5* 7.0*  HCT 23.9* 22.1*  MCV 113.3* 112.2*  PLT 109* 101*  Basic Metabolic Panel:  Recent Labs  Lab 10/20/23 0856 10/21/23 1850  NA 135 136  K 4.6 5.3*  CL 106 105  CO2 21* 22  GLUCOSE 147* 186*  BUN 44* 39*  CREATININE 0.66 0.77  CALCIUM 8.3* 8.4*  GFR: Estimated Creatinine Clearance: 90.2 mL/min (by C-G formula based on SCr of 0.77 mg/dL). No results found for this or any previous visit (from the past 240 hours).  Antimicrobials/Microbiology: Anti-infectives (From admission, onward)    None         Component Value Date/Time   SDES BLOOD LEFT ARM 10/07/2023 1031   SPECREQUEST  10/07/2023 1031    BOTTLES DRAWN AEROBIC ONLY Blood Culture results may not be optimal due to an inadequate volume of blood received in culture bottles   CULT  10/07/2023 1031    NO GROWTH 5 DAYS Performed at [facility], [address] [place], [place] [address]    REPTSTATUS 10/12/2023 FINAL 10/07/2023 1031  Radiology Studies: [facility] Chest Port 1 View Result Date: 10/21/2023 CLINICAL DATA:  Short of breath EXAM: PORTABLE CHEST 1 VIEW COMPARISON:  CT 10/07/2023 FINDINGS: Port in the anterior chest wall with tip in distal SVC. Normal  cardiac silhouette. Large paraspinal mass superimposes on the RIGHT hila measuring 5 cm. No effusion, infiltrate, or pneumothorax. IMPRESSION: 1. No acute cardiopulmonary process. 2. Large RIGHT paraspinal mass. Electronically Signed   By: Jackquline Boxer M.D.   On: 10/21/2023 10:53   LOS: 15 days  Total time spent in review of labs and imaging, patient evaluation, formulation of plan, documentation and communication with family: 50 minutes  Mennie LAMY, MD  [facility]  10/22/2023, 8:08 AM

## 2023-10-22 NOTE — Progress Notes (Signed)
  Daily Progress Note   Patient Name: David [name]       Date: 10/22/2023 DOB: 1951/02/04  Age: 73 y.o. MRN#: [id number] Attending Physician: Christobal Guadalajara, MD Primary Care Physician: Marvine Rush, MD Admit Date: 10/07/2023 Length of Stay: 15 days  Reason for Consultation/Follow-up: Establishing goals of care  Subjective:   Subjective:  Awake alert, resting in bed, believes that his breathing is better, goals of care discussed.     Objective:   Vital Signs:  BP 121/68 (BP Location: Right Arm)   Pulse 95   Temp 97.7 F (36.5 C) (Oral)   Resp 18   Ht 5' 11 (1.803 m)   Wt 89.2 kg   SpO2 100%   BMI 27.43 kg/m   I personally reviewed recent imaging.   CXR from 2-6 noted.   Assessment & Plan:   Assessment: Patient is 73 year old male with past medical history of tobacco use, hep C previously treated with antivirals, hypertension, and metastatic left kidney clear-cell RCC status post robotic assisted laparoscopic radical nephrectomy on 08/13/2022 who was transferred from [facility] on 10/07/2023 for patient to receive radiation collagen treatment.  Palliative medicine team consulted for complex medical decision making.  Recommendations/Plan: # Complex medical decision making/goals of care:  - Patient continued discussions with oncology regarding cancer directed therapies and plans to follow-up with them in the outpatient setting.     -  Code Status: Limited: Do not attempt resuscitation (DNR) -DNR-LIMITED -Do Not Intubate/DNI  Goals of care discussions undertaken with patient this am. Patient states that he has a good understanding of the serious irreversible nature of his condition. They were trying some things with the radiation, but nothing is going to make it better. I think it is time for hospice.    # Symptom management: Patient is receiving these palliative interventions for symptom management with an intent to improve quality of life.   -Pain and dyspnea in the  setting of metastatic clear-cell RCC   Continue as needed oxycodone  5 mg every 4 hours PRN and monitor.   # Psychosocial Support:  -chaplain involved   # Discharge Planning:    Recommend residential hospice evaluation, based on my conversations with the patient who is alert and decisional this morning.   Thank you for allowing the palliative care team to participate in the care David [name].  Palliative medicine team will continue to follow along peripherally. Please reach out if acute palliative medicine needs arise.  mod MDM Lonia Serve MD Palliative Care Provider PMT # [phone]  If patient remains symptomatic despite maximum doses, please call PMT at [phone] between 0700 and 1900. Outside of these hours, please call attending, as PMT does not have night coverage.

## 2023-10-22 NOTE — Progress Notes (Signed)
 PT Cancellation Note  Patient Details Name: David [name] MRN: [id number] DOB: 1950/10/08   Cancelled Treatment:    Reason Eval/Treat Not Completed: Medical issues which prohibited therapy (plan is now to DC to residential hospice, PT signing off.)   [name] PT 10/22/2023  Acute Rehabilitation Services  Office [phone]

## 2023-10-22 NOTE — Plan of Care (Signed)
  Problem: Education: Goal: Knowledge of General Education information will improve Description: Including pain rating scale, medication(s)/side effects and non-pharmacologic comfort measures Outcome: Progressing   Problem: Health Behavior/Discharge Planning: Goal: Ability to manage health-related needs will improve Outcome: Progressing   Problem: Clinical Measurements: Goal: Ability to maintain clinical measurements within normal limits will improve Outcome: Progressing Goal: Will remain free from infection Outcome: Progressing Goal: Diagnostic test results will improve Outcome: Progressing Goal: Cardiovascular complication will be avoided Outcome: Progressing   Problem: Coping: Goal: Level of anxiety will decrease Outcome: Progressing   Problem: Elimination: Goal: Will not experience complications related to bowel motility Outcome: Progressing Goal: Will not experience complications related to urinary retention Outcome: Progressing   Problem: Pain Managment: Goal: General experience of comfort will improve and/or be controlled Outcome: Progressing   Problem: Safety: Goal: Ability to remain free from injury will improve Outcome: Progressing   Problem: Clinical Measurements: Goal: Respiratory complications will improve Outcome: Not Progressing   Problem: Activity: Goal: Risk for activity intolerance will decrease Outcome: Not Progressing   Problem: Nutrition: Goal: Adequate nutrition will be maintained Outcome: Not Progressing

## 2023-10-22 NOTE — TOC Progression Note (Signed)
 Transition of Care [facility]) - Progression Note   Patient Details  Name: David [name] MRN: [id number] Date of Birth: 1951/04/13  Transition of Care [facility]) CM/SW Contact  Duwaine GORMAN Aran, LCSW Phone Number: 10/22/2023, 11:54 AM  Clinical Narrative: Discharge plan has changed from SNF to residential hospice. CSW notified Kia in admissions at [facility] of the change in discharge plan. Family and patient agreeable to [facility] residential hospice (formerly [facility]). CSW made [facility] referral to Lucie [phone]) with [facility]. Per Lucie [facility] is currently full but an RN will meet with the patient this weekend to complete an assessment. CSW updated sister, patient, and treatment team.  Expected Discharge Plan: Hospice Medical Facility Barriers to Discharge: Hospice Bed not available  Expected Discharge Plan and Services In-house Referral: Clinical Social Work Post Acute Care Choice: Residential Hospice Bed Living arrangements for the past 2 months: Single Family Home             DME Arranged: N/A DME Agency: NA  Social Determinants of Health (SDOH) Interventions SDOH Screenings   Food Insecurity: No Food Insecurity (10/07/2023)  Housing: Low Risk  (10/07/2023)  Transportation Needs: No Transportation Needs (10/07/2023)  Utilities: Not At Risk (10/07/2023)  Depression (PHQ2-9): Low Risk  (01/12/2023)  Financial Resource Strain: Low Risk  (03/24/2023)   Received from [facility]  Recent Concern: Financial Resource Strain - High Risk (02/01/2023)  Social Connections: Unknown (03/07/2023)   Received from [facility]  Stress: Patient Declined (03/07/2023)   Received from [facility]  Tobacco Use: High Risk (10/09/2023)   Readmission Risk Interventions     No data to display

## 2023-10-23 DIAGNOSIS — C7951 Secondary malignant neoplasm of bone: Secondary | ICD-10-CM | POA: Diagnosis not present

## 2023-10-23 DIAGNOSIS — R0602 Shortness of breath: Secondary | ICD-10-CM

## 2023-10-23 LAB — TYPE AND SCREEN
ABO/RH(D): O POS
Antibody Screen: NEGATIVE

## 2023-10-23 NOTE — Progress Notes (Signed)
 PROGRESS NOTE David [name]  FMW:[id number] DOB: 05-25-1951 DOA: 10/07/2023 PCP: Marvine Rush, MD  Brief Narrative/Hospital Course:  73 y.o. male smoker with past medical history relevant for history of hep C previously treated with antivirals, HTN, and  Metastatic left kidney clear-cell RCC, s/p Left robotic assisted laparoscopic radical nephrectomy by Dr. Sherrilee on 08/13/2022 with Pathology: at that time showing 7 cm clear-cell RCC with focal sarcomatoid and rhabdoid features, nuclear grade 4.  Tumor extended into the renal vein and renal sinus fat. PET scan (12/24/2022): Enlarging hypermetabolic left adrenal metastasis.  2 small hypermetabolic soft tissue nodules in the left nephrectomy bed.  Hypermetabolic lytic lesions involving distal left clavicle and left temporal bone.  Indeterminate small hypermetabolic parotid nodules bilaterally. 1/23>Transferred from [facility] for radiation oncology treatment and started on froday with plan for 10 rx last being on next Thursday. Palliative care following. At this time plan for SNF once bed available at [facility]. Patient noted to be more short of breath hypoxic needing supplemental oxygen on 2/6 discussed extensively with the patient and his sister at the bedside he agreed for DNR. CT angio chest obtained 2/7> no PE, bibasilar atelectasis question compatible with pneumonia in the posterior left lower lobe difficult to exclude, stable metastatic disease in the left lobe of the liver, metastatic lymphadenopathy in the lower posterior mediastinum and upper abdomen due to peritoneal, multifocal metastatic disease of the skeleton including left clavicle bilateral ribs thoracic spine, inferior left scapula largest mass continues to be at T7 involving T7 vertebral body, right side posterior elements and right posterior seventh rib and this area of soft tissue tumor measures up to 2 cm. Initially patient and family wanted to pursue  SNF but after subsequent  discussion with Dr. Rogers they want to pursue hospice in [facility] and is being evaluated by [facility].   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 Has undergone therapy per Dr. Katragadda in [place]. Dscussed with Dr. Katragadda on 10/21/23  he has advised hospice   Thoracic metastatic disease with pathologic fractures:  Seen on CTA chest 1/23 with marked progression since an August PET with four levels affected (T7, T8, T11, T12) pathologic vertebral fractures, destruction of the posterior right 8th rib, and tumor extension into the spinal canal. Associated prevertebral lymph node metastases. Multifocal liver metastases, new since 2023 and individually up to 2.3 cm.  -Was [facility] from [facility] for XRT, initiated 1/24, planning 10 fractions (final Tx 2/6). Next Tx 2/6 on schedule. Continue steroid. Continue prn oxycodone . Appreciate palliative care following and will follow at SNF after DC.   Acute hypoxic respiratory failure Shortness of breath/chest pain: Patient reports shortness of breath for several days and has been needing oxygen since 2/6 evening.  CT angio chest finding as above with metastatic  disease as above, currently afebrile.  Continue supplemental oxygen, supportive care pain  management  Dehydration/lactic acidosis:. Resolved.     Acute diarrhea: Has been having diarrhea in the setting of his chemotherapy. Negative infectious work up. Resolved.   AKI: Due to diarrhea.  Resolved.     Anemia of chronic disease AND malignancy: Hgb stable in high 8's, no bleeding. Monitor intermittently.  Transfuse if less than 7 g Recent Labs  Lab 10/20/23 1140 10/21/23 1850 10/22/23 0807  HGB 7.5* 7.0* 7.1*  HCT 23.9* 22.1* 22.9*   Thrombocytopenia Leukopenia : Patient platelet count hemoglobin as well as WBC count downtrending, on Lovenox , monitor closely Recent Labs  Lab 10/20/23 1140 10/21/23 1850 10/22/23 0807  HGB 7.5* 7.0* 7.1*  HCT 23.9* 22.1* 22.9*  WBC 4.1 3.0* 3.0*  PLT 109* 101* 96*    Constipation:  Regular bowel regimen in place, abd exam benign, and symptoms appear to be stabilizing, will continue at this dose.    Hypertension:  BP stable continue home amlodipine  10.    BPH:  Continue tamsulosin    Insomnia:  Continue remeron  qHS  Moderate malnutrition: Augment diet as below.   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) Signs/Symptoms: moderate fat depletion, moderate muscle depletion, percent weight loss (20.5% loss in 8 months) Percent weight loss: 20.5 % (in 8 months) Interventions: Refer to RD note for recommendations, Boost Plus Goals of care: Overall prognosis does not appear bright, patient wishes to be full code at this time palliative care has seen the patient  Goals of care: Patient with hypoxia, he reports he has been dealing with cancer for a while and feels tired.His sister arrived at the bedside  and all agreed for DNR DNI on 10/21/23 evening.Initially patient and family wanted to pursue  SNF but after subsequent discussion with Dr. Rogers they want to pursue hospice in [facility] and is being evaluated by [facility].  DVT prophylaxis: enoxaparin  (LOVENOX ) injection 40 mg Start: 10/14/23 2200 SCDs Start: 10/07/23 1559 Place TED hose Start: 10/07/23 1559 Code Status:   Code Status: Limited: Do not attempt resuscitation (DNR) -DNR-LIMITED -Do Not Intubate/DNI  Family Communication: plan of care discussed with patient at bedside. Patient status is: Remains hospitalized because of severity of illness Level of care: Progressive   Dispo: The patient is from: home            Anticipated disposition: Awaiting hospice at [facility]  Objective: Vitals last 24 hrs: Vitals:   10/22/23 0456 10/22/23 1156 10/22/23 2029 10/23/23 0510  BP: 121/68 133/74 114/74 105/68  Pulse: 95 (!) 102 (!) 106 (!) 103  Resp: 18 20  16   Temp: 97.7 F (36.5 C) 98 F (36.7 C) (!) 97.4 F (36.3 C) 98.4 F (36.9 C)  TempSrc: Oral  Oral Oral  SpO2: 100% 100% 100% 100%  Weight:      Height:       Weight change:   Physical Examination: General exam: alert awake, oriented  HEENT:Oral mucosa moist, Ear/Nose WNL grossly Respiratory system: Bilaterally clear BS,no use of accessory muscle Cardiovascular system: S1 & S2 +, No JVD. Gastrointestinal system: Abdomen soft,NT,ND, BS+ Nervous System: Alert, awake, moving all extremities,and following commands. Extremities: LE edema neg,distal peripheral pulses palpable and warm.  Skin: No rashes,no icterus. MSK: Normal muscle bulk,tone, power   Medications reviewed:  Scheduled Meds:  amLODipine   10 mg Oral Daily   Chlorhexidine  Gluconate  Cloth  6 each Topical Q2200   dexamethasone   6 mg Oral Daily   enoxaparin  (LOVENOX ) injection  40 mg Subcutaneous Q24H   lactose free nutrition  237 mL Oral BID BM   mirtazapine   7.5 mg Oral QHS   polyethylene glycol  17 g Oral Daily   senna-docusate  2 tablet Oral QHS   sodium chloride  flush  10-40 mL Intracatheter Q12H   sodium chloride  flush  3 mL Intravenous Q12H   sodium chloride  flush  3 mL Intravenous Q12H   tamsulosin   0.4 mg  Oral Daily  Continuous Infusions:  sodium chloride       Diet Order             Diet regular Room service appropriate? Yes; Fluid consistency: Thin  Diet effective now                   Intake/Output Summary (Last 24 hours) at 10/23/2023 1100 Last data filed at 10/23/2023 0230 Gross per 24 hour  Intake --  Output 600 ml  Net -600 ml   Net IO Since Admission: -13,500.07 mL [10/23/23 1100]  Wt Readings from Last 3 Encounters:  10/14/23 89.2 kg  09/30/23 89.8 kg  09/06/23 89.8 kg    Unresulted Labs (From admission, onward)    None     Data Reviewed: I have personally reviewed following labs and imaging studies CBC: Recent Labs  Lab 10/20/23 1140 10/21/23 1850 10/22/23 0807  WBC 4.1 3.0* 3.0*  HGB 7.5* 7.0* 7.1*  HCT 23.9* 22.1* 22.9*  MCV 113.3* 112.2* 111.7*  PLT 109* 101* 96*  Basic Metabolic Panel:  Recent Labs  Lab 10/20/23 0856 10/21/23 1850 10/22/23 0807  NA 135 136 139  K 4.6 5.3* 3.9  CL 106 105 115*  CO2 21* 22 19*  GLUCOSE 147* 186* 85  BUN 44* 39* 30*  CREATININE 0.66 0.77 0.54*  CALCIUM 8.3* 8.4* 6.7*  GFR: Estimated Creatinine Clearance: 90.2 mL/min (A) (by C-G formula based on SCr of 0.54 mg/dL (L)). No results found for this or any previous visit (from the past 240 hours).  Antimicrobials/Microbiology: Anti-infectives (From admission, onward)    None         Component Value Date/Time   SDES BLOOD LEFT ARM 10/07/2023 1031   SPECREQUEST  10/07/2023 1031    BOTTLES DRAWN AEROBIC ONLY Blood Culture results may not be optimal due to an inadequate volume of blood received in culture bottles   CULT  10/07/2023 1031    NO GROWTH 5 DAYS Performed at [facility], [address] [place], [place] [address]    REPTSTATUS 10/12/2023 FINAL 10/07/2023 1031  Radiology Studies: CT Angio Chest Pulmonary Embolism (PE) W or WO Contrast Result Date: 10/22/2023 CLINICAL DATA:  Hypoxia and history metastatic renal carcinoma. EXAM: CT ANGIOGRAPHY CHEST  WITH CONTRAST TECHNIQUE: Multidetector CT imaging of the chest was performed using the standard protocol during bolus administration of intravenous contrast. Multiplanar CT image reconstructions and MIPs were obtained to evaluate the vascular anatomy. RADIATION DOSE REDUCTION: This exam was performed according to the departmental dose-optimization program which includes automated exposure control, adjustment of the mA and/or kV according to patient size and/or use of iterative reconstruction technique. CONTRAST:  75mL OMNIPAQUE  IOHEXOL  350 MG/ML SOLN COMPARISON:  10/07/2023 FINDINGS: Cardiovascular: The pulmonary arteries are adequately opacified. There is no evidence of pulmonary embolism. Central pulmonary arteries are of normal caliber. The thoracic aorta is normal in caliber. Stable mild aortic atherosclerosis. The heart size  is normal. No pericardial fluid identified. No significant visualized calcified coronary artery plaque. Mediastinum/Nodes: Stable lower posterior mediastinal lymphadenopathy posterior to the esophagus and to the right of the descending thoracic aorta. No significant mediastinal, hilar or axillary lymphadenopathy identified. Lungs/Pleura: Stable trace bilateral pleural fluid. Bibasilar atelectasis, left greater than right. Component of developing pneumonia in the posterior left lower lobe would be difficult to completely exclude. No pulmonary edema or pneumothorax. Upper Abdomen: Stable metastatic disease in the left lobe of the liver. Stable metastatic lymphadenopathy in the upper abdominal retroperitoneum Musculoskeletal: Stable multifocal metastatic disease of the skeleton including the left clavicle, bilateral ribs, thoracic spine and inferior left scapula. The largest mass continues to be at the T7 level involving the T7 vertebral body, a right-sided posterior elements and right posterior seventh rib. This area soft tissue tumor measures up to nearly 12 cm in diameter. Review of the MIP  images confirms the above findings. IMPRESSION: 1. No evidence of pulmonary embolism. 2. Stable trace bilateral pleural fluid. Bibasilar atelectasis, left greater than right. Component of developing pneumonia in the posterior left lower lobe would be difficult to completely exclude. 3. Stable metastatic disease in the left lobe of the liver. 4. Stable metastatic lymphadenopathy in the lower posterior mediastinum and upper abdominal retroperitoneum. 5. Stable multifocal metastatic disease of the skeleton including the left clavicle, bilateral ribs, thoracic spine and inferior left scapula. The largest mass continues to be at the T7 level involving the T7 vertebral body, a right-sided posterior elements and right posterior seventh rib. This area soft tissue tumor measures up to nearly 12 cm in diameter. 6. Stable mild aortic atherosclerosis. Electronically Signed   By: Marcey Moan M.D.   On: 10/22/2023 13:17   LOS: 16 days  Total time spent in review of labs and imaging, patient evaluation, formulation of plan, documentation and communication with family: 35 minutes  Mennie LAMY, MD  [facility]  10/23/2023, 11:00 AM

## 2023-10-23 NOTE — Progress Notes (Signed)
  Daily Progress Note   Patient Name: [name]       Date: 10/23/2023 DOB: 10/19/1950  Age: 73 y.o. MRN#: [id number] Attending Physician: Christobal Guadalajara, MD Primary Care Physician: Marvine Rush, MD Admit Date: 10/07/2023 Length of Stay: 16 days  Reason for Consultation/Follow-up: Establishing goals of care  Subjective:   Subjective:  Awake alert, resting in bed, continues with ongoing weakness, decreasing PO intake    Objective:   Vital Signs:  BP 105/68 (BP Location: Right Arm)   Pulse (!) 103   Temp 98.4 F (36.9 C) (Oral)   Resp 16   Ht 5' 11 (1.803 m)   Wt 89.2 kg   SpO2 100%   BMI 27.43 kg/m   I personally reviewed recent imaging.   CXR from 2-6 noted.   Assessment & Plan:   Assessment: Patient is 73 year old male with past medical history of tobacco use, hep C previously treated with antivirals, hypertension, and metastatic left kidney clear-cell RCC status post robotic assisted laparoscopic radical nephrectomy on 08/13/2022 who was transferred from [facility] on 10/07/2023 for patient to receive radiation collagen treatment.  Palliative medicine team consulted for complex medical decision making.  Recommendations/Plan: # Complex medical decision making/goals of care:  - Patient continued discussions with oncology regarding cancer directed therapies and plans to follow-up with them in the outpatient setting.     -  Code Status: Limited: Do not attempt resuscitation (DNR) -DNR-LIMITED -Do Not Intubate/DNI  10-22-23:Goals of care discussions undertaken with patient this am. Patient states that he has a good understanding of the serious irreversible nature of his condition. They were trying some things with the radiation, but nothing is going to make it better. I think it is time for hospice.    # Symptom management: Patient is receiving these palliative interventions for symptom management with an intent to improve quality of life.   -Pain and dyspnea in the  setting of metastatic clear-cell RCC   Continue as needed oxycodone  5 mg every 4 hours PRN and monitor.   # Psychosocial Support:  -chaplain involved   # Discharge Planning:    Awaiting residential hospice evaluation,TOC note reviewed.   Thank you for allowing the palliative care team to participate in the care David [name].  Palliative medicine team will continue to follow along peripherally. Please reach out if acute palliative medicine needs arise.  mod MDM Lonia Serve MD Palliative Care Provider PMT # [phone]  If patient remains symptomatic despite maximum doses, please call PMT at [phone] between 0700 and 1900. Outside of these hours, please call attending, as PMT does not have night coverage.

## 2023-10-23 NOTE — TOC Progression Note (Signed)
 Transition of Care [facility]) - Progression Note    Patient Details  Name: David [name] MRN: [id number] Date of Birth: [birth year]  Transition of [facility]) CM/SW Contact  Trentyn Boisclair, Nathanel, RN Phone Number: 10/23/2023, 9:59 AM  Clinical Narrative:Received call from [facility] residential hospice rep [phone] can review on Monday for acceptance.       Expected Discharge Plan: Hospice Medical Facility Barriers to Discharge: Continued Medical Work up  Expected Discharge Plan and Services In-house Referral: Clinical Social Work   Post Acute Care Choice: Residential Hospice Bed Living arrangements for the past 2 months: Single Family Home                 DME Arranged: N/A DME Agency: NA                   Social Determinants of Health (SDOH) Interventions SDOH Screenings   Food Insecurity: No Food Insecurity (10/07/2023)  Housing: Low Risk  (10/07/2023)  Transportation Needs: No Transportation Needs (10/07/2023)  Utilities: Not At Risk (10/07/2023)  Depression (PHQ2-9): Low Risk  (01/12/2023)  Financial Resource Strain: Low Risk  (03/24/2023)   Received from [facility]  Recent Concern: Financial Resource Strain - High Risk (02/01/2023)  Social Connections: Unknown (03/07/2023)   Received from [facility]  Stress: Patient Declined (03/07/2023)   Received from [facility]  Tobacco Use: High Risk (10/09/2023)    Readmission Risk Interventions    10/22/2023   12:06 PM  Readmission Risk Prevention Plan  Transportation Screening Complete  PCP or Specialist Appt within 3-5 Days Not Complete  Not Complete comments Patient will discharge to residential hospice.  HRI or Home Care Consult Complete  Social Work Consult for Recovery Care Planning/Counseling Complete  Palliative Care Screening Complete  Medication Review [employer]) Complete

## 2023-10-24 DIAGNOSIS — C7951 Secondary malignant neoplasm of bone: Secondary | ICD-10-CM | POA: Diagnosis not present

## 2023-10-24 MED ORDER — HYDROMORPHONE HCL 1 MG/ML IJ SOLN
0.5000 mg | Freq: Three times a day (TID) | INTRAMUSCULAR | Status: DC
  Administered 2023-10-24 – 2023-10-25 (×4): 0.5 mg via INTRAVENOUS
  Filled 2023-10-24 (×4): qty 0.5

## 2023-10-24 NOTE — Progress Notes (Signed)
  Daily Progress Note   Patient Name: [name]       Date: 10/24/2023 DOB: 27-Apr-1951  Age: 73 y.o. MRN#: [id number] Attending Physician: Christobal Guadalajara, MD Primary Care Physician: Marvine Rush, MD Admit Date: 10/07/2023 Length of Stay: 17 days  Reason for Consultation/Follow-up: Establishing goals of care  Subjective:   Subjective:  Appears visibly dyspneic, not able to speak in full sentences, coarse breath sounds evident. States that he just received one dose of PO PRN opioids.   continues with ongoing weakness, decreasing PO intake    Objective:   Vital Signs:  BP (!) 117/56 (BP Location: Right Arm)   Pulse 93   Temp 98 F (36.7 C) (Oral)   Resp 20   Ht 5' 11 (1.803 m)   Wt 89.2 kg   SpO2 100%   BMI 27.43 kg/m   I personally reviewed recent imaging.   CXR from 2-6 noted.   Assessment & Plan:   Assessment: Patient is 73 year old male with past medical history of tobacco use, hep C previously treated with antivirals, hypertension, and metastatic left kidney clear-cell RCC status post robotic assisted laparoscopic radical nephrectomy on 08/13/2022 who was transferred from [facility] on 10/07/2023 for patient to receive radiation collagen treatment.  Palliative medicine team consulted for complex medical decision making.  Recommendations/Plan: # Complex medical decision making/goals of care:  - Patient continued discussions with oncology regarding cancer directed therapies and plans to follow-up with them in the outpatient setting.     -  Code Status: Limited: Do not attempt resuscitation (DNR) -DNR-LIMITED -Do Not Intubate/DNI  10-22-23:Goals of care discussions undertaken with patient this am. Patient states that he has a good understanding of the serious irreversible nature of his condition. They were trying some things with the radiation, but nothing is going to make it better. I think it is time for hospice.    # Symptom management: Patient is receiving these  palliative interventions for symptom management with an intent to improve quality of life.   -Pain and dyspnea in the setting of metastatic clear-cell RCC   Continue as needed oxycodone  5 mg every 4 hours PRN and monitor.  Add low dose scheduled IV Dilaudid  for pain and dyspnea, patient's symptom burden is noted to be escalating.  # Psychosocial Support:  -chaplain involved   # Discharge Planning:    Awaiting residential hospice evaluation,TOC note reviewed.   Thank you for allowing the palliative care team to participate in the care David [name].  Palliative medicine team will continue to follow along peripherally. Please reach out if acute palliative medicine needs arise.  mod MDM Lonia Serve MD Palliative Care Provider PMT # [phone]  If patient remains symptomatic despite maximum doses, please call PMT at [phone] between 0700 and 1900. Outside of these hours, please call attending, as PMT does not have night coverage.

## 2023-10-24 NOTE — Progress Notes (Signed)
 PROGRESS NOTE David [name]  FMW:[id number] DOB: [birth year] DOA: 10/07/2023 PCP: Marvine Rush, MD  Brief Narrative/Hospital Course:  73 y.o. male smoker with past medical history relevant for history of hep C previously treated with antivirals, HTN, and  Metastatic left kidney clear-cell RCC, s/p Left robotic assisted laparoscopic radical nephrectomy by Dr. Sherrilee on 08/13/2022 with Pathology: at that time showing 7 cm clear-cell RCC with focal sarcomatoid and rhabdoid features, nuclear grade 4.  Tumor extended into the renal vein and renal sinus fat. PET scan (12/24/2022): Enlarging hypermetabolic left adrenal metastasis.  2 small hypermetabolic soft tissue nodules in the left nephrectomy bed.  Hypermetabolic lytic lesions involving distal left clavicle and left temporal bone.  Indeterminate small hypermetabolic parotid nodules bilaterally. 1/23>Transferred from [facility] for radiation oncology treatment and started on froday with plan for 10 rx last being on next Thursday. Palliative care following. At this time plan for SNF once bed available at [facility]. Patient noted to be more short of breath hypoxic needing supplemental oxygen on 2/6 discussed extensively with the patient and his sister at the bedside he agreed for DNR. CT angio chest obtained 2/7> no PE, bibasilar atelectasis question compatible with pneumonia in the posterior left lower lobe difficult to exclude, stable metastatic disease in the left lobe of the liver, metastatic lymphadenopathy in the lower posterior mediastinum and upper abdomen due to peritoneal, multifocal metastatic disease of the skeleton including left clavicle bilateral ribs thoracic spine, inferior left scapula largest mass continues to be at T7 involving T7 vertebral body, right side posterior elements and right posterior seventh rib and this area of soft tissue tumor measures up to 2 cm. Initially patient and family wanted to pursue  SNF but after subsequent  discussion with Dr. Rogers they want to pursue hospice in [facility] and is being evaluated by [facility] on 10/25/23.   Subjective: Seen this morning. 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 Has undergone therapy per Dr. Katragadda in [place]. Dscussed with Dr. Katragadda on 10/21/23  he has advised hospice   Thoracic metastatic disease with pathologic fractures:  Seen on CTA chest 1/23 with marked progression since an August PET with four levels affected (T7, T8, T11, T12) pathologic vertebral fractures, destruction of the posterior right 8th rib, and tumor extension into the spinal canal. Associated prevertebral lymph node metastases. Multifocal liver metastases, new since 2023 and individually up to 2.3 cm.  -[facility] from [facility] for [facility]RT, initiated 1/24, planning 10 fractions (final Tx 2/6). Next Tx 2/6 on schedule. Continue steroid. Continue prn oxycodone . Appreciate palliative care following, plan is for hospice    Acute hypoxic respiratory failure Shortness of breath/chest pain: Patient reports shortness of breath for several days and has been needing oxygen since 2/6 evening. CT angio chest finding as above with metastatic  disease as above, currently afebrile vital stable and doing well on 2 L nasal  cannula.  Dehydration/lactic acidosis:. Resolved.     Acute diarrhea: Has been having diarrhea in the setting of his chemotherapy. Negative infectious work up. Resolved.   AKI: Due to diarrhea.  Resolved.     Anemia of chronic disease AND malignancy: Hgb stable in high 8's, no bleeding. Monitor intermittently.  Transfuse if less than 7 g.  Planning for hospice at this time Recent Labs  Lab 10/20/23 1140 10/21/23 1850 10/22/23 0807  HGB 7.5* 7.0* 7.1*  HCT 23.9* 22.1* 22.9*   Thrombocytopenia Leukopenia : Patient platelet count hemoglobin as well as WBC count downtrending, on Lovenox , monitor closely Recent Labs  Lab 10/20/23 1140 10/21/23 1850 10/22/23 0807  HGB 7.5* 7.0* 7.1*  HCT 23.9* 22.1* 22.9*  WBC 4.1 3.0* 3.0*  PLT 109* 101* 96*    Constipation:  Regular bowel regimen in place, abd exam benign, and symptoms appear to be stabilizing, will continue at this dose.    Hypertension:  BP stable continue home amlodipine  10.    BPH:  Continue tamsulosin    Insomnia:  Continue remeron  qHS  Moderate malnutrition: Augment diet as below.   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) Signs/Symptoms: moderate fat depletion, moderate muscle depletion, percent weight loss (20.5% loss in 8 months) Percent weight loss: 20.5 % (in 8 months) Interventions: Refer to RD note for recommendations, Boost Plus Goals of care: Overall prognosis does not appear bright, patient wishes to be full code at this time palliative care has seen the patient  Goals of care: Patient with hypoxia, he reports he has been dealing with cancer for a while and feels tired.His sister arrived at the bedside  and all agreed for DNR DNI on 10/21/23 evening.Initially patient and family wanted to pursue  SNF but after subsequent discussion with Dr. Rogers they want to pursue hospice in [facility] and is being evaluated by [facility].  DVT prophylaxis: enoxaparin  (LOVENOX ) injection 40 mg Start: 10/14/23 2200 SCDs Start: 10/07/23 1559 Place TED hose Start: 10/07/23 1559 Code Status:   Code Status: Limited: Do not attempt resuscitation (DNR) -DNR-LIMITED -Do Not Intubate/DNI  Family Communication: plan of care discussed with patient at bedside. Patient status is: Remains hospitalized because of severity of illness Level of care: Progressive > discontinue telemetry  Dispo: The patient is from: home            Anticipated disposition: Awaiting hospice at [facility] evaluation tomorrow  Objective: Vitals last 24 hrs: Vitals:   10/23/23 1316 10/23/23 2107 10/24/23 0438 10/24/23 0834  BP: 121/63 102/72 111/66 (!) 117/56  Pulse: (!) 108 (!) 103 100 93  Resp: 18   20  Temp: 98 F (36.7 C) 98.1 F (36.7 C) (!) 97.4 F (36.3 C) 98 F (36.7 C)  TempSrc: Oral Oral Oral Oral  SpO2: 94% 100% 100%   Weight:      Height:       Weight change:   Physical Examination: General exam: alert awake, oriented at baseline, older than stated age HEENT:Oral mucosa moist, Ear/Nose WNL grossly Respiratory system: Bilaterally clear BS,no use of accessory muscle Cardiovascular system: S1 & S2 +, No JVD. Gastrointestinal system: Abdomen soft,NT,ND, BS+ Nervous System: Alert, awake, moving all extremities,and following commands. Extremities: LE edema neg,distal peripheral pulses palpable and warm.  Skin: No rashes,no icterus. MSK: Normal muscle bulk,tone, power  Medications reviewed:  Scheduled Meds:  amLODipine   10 mg Oral Daily   Chlorhexidine  Gluconate Cloth  6 each Topical Q2200   dexamethasone   6 mg Oral Daily   enoxaparin  (LOVENOX ) injection  40 mg Subcutaneous Q24H   lactose free nutrition  237 mL Oral BID BM   mirtazapine   7.5 mg Oral QHS   polyethylene glycol  17 g Oral Daily   senna-docusate  2 tablet Oral QHS   sodium chloride  flush  10-40 mL Intracatheter Q12H    sodium chloride  flush  3 mL Intravenous Q12H   sodium chloride  flush  3 mL Intravenous Q12H   tamsulosin   0.4 mg Oral Daily  Continuous Infusions:  sodium chloride       Diet Order             Diet regular Room service appropriate? Yes; Fluid consistency: Thin  Diet effective now                   Intake/Output Summary (Last 24 hours) at 10/24/2023 0838 Last data filed at 10/24/2023 0440 Gross per 24 hour  Intake 480 ml  Output 1525 ml  Net -1045 ml   Net IO Since Admission: -14,545.07 mL [10/24/23 0838]  Wt Readings from Last 3 Encounters:  10/14/23 89.2 kg  09/30/23 89.8 kg  09/06/23 89.8 kg    Unresulted Labs (From admission, onward)    None     Data Reviewed: I have personally reviewed following labs and imaging studies CBC: Recent Labs  Lab 10/20/23 1140 10/21/23 1850 10/22/23 0807  WBC 4.1 3.0* 3.0*  HGB 7.5* 7.0* 7.1*  HCT 23.9* 22.1* 22.9*  MCV 113.3* 112.2* 111.7*  PLT 109* 101* 96*  Basic Metabolic Panel:  Recent Labs  Lab 10/20/23 0856 10/21/23 1850 10/22/23 0807  NA 135 136 139  K 4.6 5.3* 3.9  CL 106 105 115*  CO2 21* 22 19*  GLUCOSE 147* 186* 85  BUN 44* 39* 30*  CREATININE 0.66 0.77 0.54*  CALCIUM 8.3* 8.4* 6.7*  GFR: Estimated Creatinine Clearance: 90.2 mL/min (A) (by C-G formula based on SCr of 0.54 mg/dL (L)). No results found for this or any previous visit (from the past 240 hours).  Antimicrobials/Microbiology: Anti-infectives (From admission, onward)    None         Component Value Date/Time   SDES BLOOD LEFT ARM 10/07/2023 1031   SPECREQUEST  10/07/2023 1031    BOTTLES DRAWN AEROBIC ONLY Blood Culture results may not be optimal due to an inadequate volume of blood received in culture bottles   CULT  10/07/2023 1031    NO GROWTH 5 DAYS Performed at [facility], [address] [place], [place] [address]    REPTSTATUS 10/12/2023 FINAL 10/07/2023 1031  Radiology Studies: CT Angio Chest Pulmonary Embolism (PE) W or WO  Contrast Result Date: 10/22/2023 CLINICAL DATA:  Hypoxia and history metastatic renal carcinoma. EXAM: CT ANGIOGRAPHY CHEST WITH CONTRAST TECHNIQUE: Multidetector CT imaging of the chest was performed using the standard protocol during bolus administration of intravenous contrast. Multiplanar CT image reconstructions and MIPs were obtained to evaluate the vascular anatomy. RADIATION DOSE REDUCTION: This exam was performed according to the departmental dose-optimization program which includes automated exposure control, adjustment of the mA and/or kV according to patient size and/or use of iterative reconstruction technique. CONTRAST:  75mL OMNIPAQUE  IOHEXOL  350 MG/ML SOLN COMPARISON:  10/07/2023 FINDINGS: Cardiovascular: The pulmonary arteries are adequately opacified. There is no evidence of pulmonary embolism. Central pulmonary arteries are  of normal caliber. The thoracic aorta is normal in caliber. Stable mild aortic atherosclerosis. The heart size is normal. No pericardial fluid identified. No significant visualized calcified coronary artery plaque. Mediastinum/Nodes: Stable lower posterior mediastinal lymphadenopathy posterior to the esophagus and to the right of the descending thoracic aorta. No significant mediastinal, hilar or axillary lymphadenopathy identified. Lungs/Pleura: Stable trace bilateral pleural fluid. Bibasilar atelectasis, left greater than right. Component of developing pneumonia in the posterior left lower lobe would be difficult to completely exclude. No pulmonary edema or pneumothorax. Upper Abdomen: Stable metastatic disease in the left lobe of the liver. Stable metastatic lymphadenopathy in the upper abdominal retroperitoneum Musculoskeletal: Stable multifocal metastatic disease of the skeleton including the left clavicle, bilateral ribs, thoracic spine and inferior left scapula. The largest mass continues to be at the T7 level involving the T7 vertebral body, a right-sided posterior  elements and right posterior seventh rib. This area soft tissue tumor measures up to nearly 12 cm in diameter. Review of the MIP images confirms the above findings. IMPRESSION: 1. No evidence of pulmonary embolism. 2. Stable trace bilateral pleural fluid. Bibasilar atelectasis, left greater than right. Component of developing pneumonia in the posterior left lower lobe would be difficult to completely exclude. 3. Stable metastatic disease in the left lobe of the liver. 4. Stable metastatic lymphadenopathy in the lower posterior mediastinum and upper abdominal retroperitoneum. 5. Stable multifocal metastatic disease of the skeleton including the left clavicle, bilateral ribs, thoracic spine and inferior left scapula. The largest mass continues to be at the T7 level involving the T7 vertebral body, a right-sided posterior elements and right posterior seventh rib. This area soft tissue tumor measures up to nearly 12 cm in diameter. 6. Stable mild aortic atherosclerosis. Electronically Signed   By: Marcey Moan M.D.   On: 10/22/2023 13:17   LOS: 17 days  Total time spent in review of labs and imaging, patient evaluation, formulation of plan, documentation and communication with family: 35 minutes  Mennie LAMY, MD  [facility]  10/24/2023, 8:38 AM

## 2023-10-25 DIAGNOSIS — C7951 Secondary malignant neoplasm of bone: Secondary | ICD-10-CM | POA: Diagnosis not present

## 2023-10-25 DIAGNOSIS — Z7189 Other specified counseling: Secondary | ICD-10-CM

## 2023-10-25 MED ORDER — DEXAMETHASONE 6 MG PO TABS: 6.0000 mg | ORAL_TABLET | Freq: Every day | ORAL | Status: DC

## 2023-10-25 MED ORDER — HEPARIN SOD (PORK) LOCK FLUSH 100 UNIT/ML IV SOLN
500.0000 [IU] | INTRAVENOUS | Status: AC | PRN
  Administered 2023-10-25: 500 [IU]
  Filled 2023-10-25: qty 5

## 2023-10-25 MED ORDER — OXYCODONE HCL 5 MG PO TABS
5.0000 mg | ORAL_TABLET | ORAL | 0 refills | Status: DC | PRN
Start: 2023-10-25 — End: 2023-11-29

## 2023-10-25 MED ORDER — ONDANSETRON HCL 4 MG PO TABS: 4.0000 mg | ORAL_TABLET | Freq: Four times a day (QID) | ORAL | 0 refills | Status: DC | PRN

## 2023-10-25 MED ORDER — MIRTAZAPINE 7.5 MG PO TABS: 7.5000 mg | ORAL_TABLET | Freq: Every day | ORAL | Status: DC

## 2023-10-25 NOTE — TOC Transition Note (Signed)
Transition of Care [facility]) - Discharge Note  Patient Details  Name: David [name] MRN: [id number] Date of Birth: 1951-04-21  Transition of Care [facility]) CM/SW Contact:  Ewing Schlein, LCSW Phone Number: 10/25/2023, 4:04 PM  Clinical Narrative: CSW notified by Leia Alf with [facility] that a bed is available and patient can be admitted once his sister signs consents. Discharge packet completed. PTAR scheduled. TOC signing off.  Final next level of care: Hospice Medical Facility Barriers to Discharge: Barriers Resolved  Patient Goals and CMS Choice Patient states their goals for this hospitalization and ongoing recovery are:: Go to [facility] residential hospice through [facility].gov Compare Post Acute Care list provided to:: Patient Represenative (must comment) Choice offered to / list presented to : Patient, Sibling  Discharge Placement  Patient chooses bed at: Other - please specify in the comment section below: [name]) Patient to be transferred to facility by: PTAR Patient and family notified of of transfer: 10/25/23  Discharge Plan and Services Additional resources added to the After Visit Summary for   In-house Referral: Clinical Social Work Post Acute Care Choice: Residential Hospice Bed          DME Arranged: N/A DME Agency: NA  Social Drivers of Health (SDOH) Interventions SDOH Screenings   Food Insecurity: No Food Insecurity (10/07/2023)  Housing: Low Risk  (10/07/2023)  Transportation Needs: No Transportation Needs (10/07/2023)  Utilities: Not At Risk (10/07/2023)  Depression (PHQ2-9): Low Risk  (01/12/2023)  Financial Resource Strain: Low Risk  (03/24/2023)   Received from [facility]  Recent Concern: Financial Resource Strain - High Risk (02/01/2023)  Social Connections: Unknown (03/07/2023)   Received from [facility]  Stress: Patient Declined (03/07/2023)   Received from [facility]  Tobacco Use: High Risk  (10/09/2023)   Readmission Risk Interventions    10/22/2023   12:06 PM  Readmission Risk Prevention Plan  Transportation Screening Complete  PCP or Specialist Appt within 3-5 Days Not Complete  Not Complete comments Patient will discharge to residential hospice.  HRI or Home Care Consult Complete  Social Work Consult for Recovery Care Planning/Counseling Complete  Palliative Care Screening Complete  Medication Review Oceanographer) Complete

## 2023-10-25 NOTE — Discharge Summary (Signed)
 Physician Discharge Summary  David [name] QIO:[id number] DOB: May 12, 1951 DOA: 10/07/2023  PCP: [name], MD  Admit date: 10/07/2023 Discharge date: 10/25/2023 Recommendations for Outpatient Follow-up:  Follow up with PCP in 1 weeks-call for appointment Please obtain BMP/CBC in one week  Discharge Dispo: hospice Discharge Condition: Stable Code Status:   Code Status: Limited: Do not attempt resuscitation (DNR) -DNR-LIMITED -Do Not Intubate/DNI  Diet recommendation:  Diet Order             Diet regular Room service appropriate? Yes; Fluid consistency: Thin  Diet effective now                    Brief/Interim Summary:  73 y.o. male smoker with past medical history relevant for history of hep C previously treated with antivirals, HTN, and  Metastatic left kidney clear-cell RCC, s/p Left robotic assisted laparoscopic radical nephrectomy by Dr. [name] on 08/13/2022 with Pathology: at that time showing 7 cm clear-cell RCC with focal sarcomatoid and rhabdoid features, nuclear grade 4.  Tumor extended into the renal vein and renal sinus fat. PET scan (12/24/2022): Enlarging hypermetabolic left adrenal metastasis.  2 small hypermetabolic soft tissue nodules in the left nephrectomy bed.  Hypermetabolic lytic lesions involving distal left clavicle and left temporal bone.  Indeterminate small hypermetabolic parotid nodules bilaterally. 1/23>Transferred from [facility] for radiation oncology treatment and started on froday with plan for 10 rx last being on next Thursday. Palliative care following. At this time plan for SNF once bed available at [facility]. Patient noted to be more short of breath hypoxic needing supplemental oxygen on 2/6 discussed extensively with the patient and his sister at the bedside he agreed for DNR. CT angio chest obtained 2/7> no PE, bibasilar atelectasis question compatible with pneumonia in the posterior left lower lobe difficult to exclude, stable metastatic  disease in the left lobe of the liver, metastatic lymphadenopathy in the lower posterior mediastinum and upper abdomen due to peritoneal, multifocal metastatic disease of the skeleton including left clavicle bilateral ribs thoracic spine, inferior left scapula largest mass continues to be at T7 involving T7 vertebral body, right side posterior elements and right posterior seventh rib and this area of soft tissue tumor measures up to 2 cm. Initially patient and family wanted to pursue  SNF but after subsequent discussion with Dr. Cheree Cords they want to pursue hospice in [facility] and is being evaluated by [name] on 10/25/23.    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 Has undergone therapy per Dr. Katragadda in [place]. Dscussed with Dr. Katragadda on 10/21/23  he has advised hospice   Thoracic metastatic disease with pathologic fractures:  Seen on CTA chest 1/23 with marked progression since an August PET with four levels affected (T7, T8, T11, T12) pathologic vertebral fractures, destruction of the posterior right 8th rib, and tumor extension into the spinal canal. Associated prevertebral lymph node metastases. Multifocal liver metastases, new since 2023 and individually up  to 2.3 cm.  Was [facility] from [facility] for [facility]RT, initiated 1/24, planning 10 fractions (final Tx 2/6).  Missed his last treatment but at this time he wants to proceed with hospice.  Continue  steroid pain management   Acute hypoxic respiratory failure Shortness of breath/chest pain: Patient reports shortness of breath for several days and has been needing oxygen since 2/6 evening. CT angio chest finding as above with metastatic disease as above, currently afebrile vital stable and doing well on 2 L nasal cannula.   Dehydration/lactic acidosis:. Resolved.     Acute diarrhea: Has been having diarrhea in the setting of his chemotherapy. Negative infectious work up. Resolved.   AKI: Due to diarrhea.  Resolved.     Anemia of chronic disease AND malignancy: Hgb stable in high 8's, no bleeding. Monitor intermittently.  Transfuse if less than 7 g.  Planning for hospice at this time Last Labs       Recent Labs  Lab 10/20/23 1140 10/21/23 1850 10/22/23 0807  HGB 7.5* 7.0* 7.1*  HCT 23.9* 22.1* 22.9*      Thrombocytopenia Leukopenia : Patient platelet count hemoglobin as well as WBC count downtrending, on Lovenox , monitor closely Last Labs       Recent Labs  Lab 10/20/23 1140 10/21/23 1850 10/22/23 0807  HGB 7.5* 7.0* 7.1*  HCT 23.9* 22.1* 22.9*  WBC 4.1 3.0* 3.0*  PLT 109* 101* 96*      Constipation:  Regular bowel regimen in place, abd exam benign, and symptoms appear to be stabilizing, will continue at this dose.    Hypertension:  BP stable continue home amlodipine  10.    BPH:  Continue tamsulosin    Insomnia:  Continue remeron  qHS   Moderate malnutrition: Augment diet as below.   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) Signs/Symptoms: moderate fat depletion, moderate muscle depletion, percent weight loss (20.5% loss in 8 months) Percent weight loss: 20.5 % (in 8 months) Interventions: Refer to RD note for recommendations, Boost Plus Goals of care: Overall prognosis does not appear bright, patient wishes to be full code at this time palliative  care has seen the patient   Goals of care: Patient with hypoxia, he reports he has been dealing with cancer for a while and feels tired.His sister arrived at the bedside  and all agreed for DNR DNI on 10/21/23 evening.Initially patient and family wanted to pursue  SNF but after subsequent discussion with Dr. Cheree Cords they want to pursue hospice in [facility]   Consults: Oncology, rad oc palliative are Subjective: Alert awake, anxiouis  Discharge Exam: Vitals:   10/25/23 0950 10/25/23 1346  BP: 115/70 105/62  Pulse:  (!) 103  Resp:  20  Temp:  98.3 F (36.8 C)  SpO2:  100%   General: Pt is alert, awake, not in acute distress Cardiovascular: RRR, S1/S2 +, no rubs, no gallops Respiratory: CTA bilaterally, no wheezing, no rhonchi Abdominal: Soft, NT, ND, bowel sounds + Extremities: no edema, no cyanosis  Discharge Instructions  Discharge Instructions     Discharge instructions   Complete by: As directed    Please call call MD or return to ER for similar or worsening recurring problem that brought you to hospital or if any fever,nausea/vomiting,abdominal pain, uncontrolled pain, chest pain,  shortness of breath or any other alarming symptoms.  Please follow-up your doctor as instructed in a week time and call the office  for appointment.  Please avoid alcohol, smoking, or any other illicit substance and maintain healthy habits including taking your regular medications as prescribed.  You were cared for by a hospitalist during your hospital stay. If you have any questions about your discharge medications or the care you received while you were in the hospital after you are discharged, you can call the unit and ask to speak with the hospitalist on call if the hospitalist that took care of you is not available.  Once you are discharged, your primary care physician will handle any further medical issues. Please note that NO REFILLS for any discharge medications will be authorized  once you are discharged, as it is imperative that you return to your primary care physician (or establish a relationship with a primary care physician if you do not have one) for your aftercare needs so that they can reassess your need for medications and monitor your lab values   Increase activity slowly   Complete by: As directed    No wound care   Complete by: As directed       Allergies as of 10/25/2023   No Known Allergies      Medication List     STOP taking these medications    Fluzone High-Dose 0.5 ML injection Generic drug: Influenza vac split quadrivalent PF   levETIRAcetam  500 MG tablet Commonly known as: KEPPRA    lidocaine -prilocaine  cream Commonly known as: EMLA    lisinopril 10 MG tablet Commonly known as: ZESTRIL   megestrol  400 MG/10ML suspension Commonly known as: MEGACE    Spikevax syringe Generic drug: COVID-19 mRNA vaccine   triamcinolone cream 0.1 % Commonly known as: KENALOG       TAKE these medications    amLODipine  10 MG tablet Commonly known as: NORVASC  Take 1 tablet (10 mg total) by mouth daily.   Cabometyx  40 MG tablet Generic drug: cabozantinib  Take 1 tablet (40 mg total) by mouth daily. Take on an empty stomach, 1 hour before or 2 hours after meals.   Clobetasol  Propionate E 0.05 % emollient cream Generic drug: Clobetasol  Prop Emollient Base Apply 1 Application topically 2 (two) times daily.   dexamethasone  6 MG tablet Commonly known as: DECADRON  Take 1 tablet (6 mg total) by mouth daily. Start taking on: October 26, 2023   lidocaine  5 % ointment Commonly known as: XYLOCAINE  Apply 1 Application topically 2 (two) times daily as needed for moderate pain.   mirtazapine  7.5 MG tablet Commonly known as: REMERON  Take 1 tablet (7.5 mg total) by mouth at bedtime.   ondansetron  4 MG tablet Commonly known as: ZOFRAN  Take 1 tablet (4 mg total) by mouth every 6 (six) hours as needed for nausea.   oxyCODONE  5 MG immediate release  tablet Commonly known as: Oxy IR/ROXICODONE  Take 1 tablet (5 mg total) by mouth every 4 (four) hours as needed for up to 4 doses for moderate pain (pain score 4-6).   prochlorperazine  10 MG tablet Commonly known as: COMPAZINE  Take 1 tablet (10 mg total) by mouth every 6 (six) hours as needed for nausea or vomiting.   tamsulosin  0.4 MG Caps capsule Commonly known as: FLOMAX  Take 0.4 mg by mouth daily.        Contact information for after-discharge care     Destination     [facility] Preferred SNF .   Service: Skilled Nursing Contact information: [address] [place]  [phone]  No Known Allergies  The results of significant diagnostics from this hospitalization (including imaging, microbiology, ancillary and laboratory) are listed below for reference.    Microbiology: No results found for this or any previous visit (from the past 240 hours).  Procedures/Studies: CT Angio Chest Pulmonary Embolism (PE) W or WO Contrast Result Date: 10/22/2023 CLINICAL DATA:  Hypoxia and history metastatic renal carcinoma. EXAM: CT ANGIOGRAPHY CHEST WITH CONTRAST TECHNIQUE: Multidetector CT imaging of the chest was performed using the standard protocol during bolus administration of intravenous contrast. Multiplanar CT image reconstructions and MIPs were obtained to evaluate the vascular anatomy. RADIATION DOSE REDUCTION: This exam was performed according to the departmental dose-optimization program which includes automated exposure control, adjustment of the mA and/or kV according to patient size and/or use of iterative reconstruction technique. CONTRAST:  75mL OMNIPAQUE  IOHEXOL  350 MG/ML SOLN COMPARISON:  10/07/2023 FINDINGS: Cardiovascular: The pulmonary arteries are adequately opacified. There is no evidence of pulmonary embolism. Central pulmonary arteries are of normal caliber. The thoracic aorta is normal in caliber. Stable  mild aortic atherosclerosis. The heart size is normal. No pericardial fluid identified. No significant visualized calcified coronary artery plaque. Mediastinum/Nodes: Stable lower posterior mediastinal lymphadenopathy posterior to the esophagus and to the right of the descending thoracic aorta. No significant mediastinal, hilar or axillary lymphadenopathy identified. Lungs/Pleura: Stable trace bilateral pleural fluid. Bibasilar atelectasis, left greater than right. Component of developing pneumonia in the posterior left lower lobe would be difficult to completely exclude. No pulmonary edema or pneumothorax. Upper Abdomen: Stable metastatic disease in the left lobe of the liver. Stable metastatic lymphadenopathy in the upper abdominal retroperitoneum Musculoskeletal: Stable multifocal metastatic disease of the skeleton including the left clavicle, bilateral ribs, thoracic spine and inferior left scapula. The largest mass continues to be at the T7 level involving the T7 vertebral body, a right-sided posterior elements and right posterior seventh rib. This area soft tissue tumor measures up to nearly 12 cm in diameter. Review of the MIP images confirms the above findings. IMPRESSION: 1. No evidence of pulmonary embolism. 2. Stable trace bilateral pleural fluid. Bibasilar atelectasis, left greater than right. Component of developing pneumonia in the posterior left lower lobe would be difficult to completely exclude. 3. Stable metastatic disease in the left lobe of the liver. 4. Stable metastatic lymphadenopathy in the lower posterior mediastinum and upper abdominal retroperitoneum. 5. Stable multifocal metastatic disease of the skeleton including the left clavicle, bilateral ribs, thoracic spine and inferior left scapula. The largest mass continues to be at the T7 level involving the T7 vertebral body, a right-sided posterior elements and right posterior seventh rib. This area soft tissue tumor measures up to nearly 12  cm in diameter. 6. Stable mild aortic atherosclerosis. Electronically Signed   By: Erica Hau M.D.   On: 10/22/2023 13:17   DG Chest Port 1 View Result Date: 10/21/2023 CLINICAL DATA:  Short of breath EXAM: PORTABLE CHEST 1 VIEW COMPARISON:  CT 10/07/2023 FINDINGS: Port in the anterior chest wall with tip in distal SVC. Normal cardiac silhouette. Large paraspinal mass superimposes on the RIGHT hila measuring 5 cm. No effusion, infiltrate, or pneumothorax. IMPRESSION: 1. No acute cardiopulmonary process. 2. Large RIGHT paraspinal mass. Electronically Signed   By: Deboraha Fallow M.D.   On: 10/21/2023 10:53   CT Angio Chest PE W and/or Wo Contrast Result Date: 10/07/2023 CLINICAL DATA:  73 year old male with shortness of breath. Diarrhea. History of renal cell carcinoma status post left nephrectomy. * Tracking Code: BO * EXAM: CT  ANGIOGRAPHY CHEST WITH CONTRAST TECHNIQUE: Multidetector CT imaging of the chest was performed using the standard protocol during bolus administration of intravenous contrast. Multiplanar CT image reconstructions and MIPs were obtained to evaluate the vascular anatomy. RADIATION DOSE REDUCTION: This exam was performed according to the departmental dose-optimization program which includes automated exposure control, adjustment of the mA and/or kV according to patient size and/or use of iterative reconstruction technique. CONTRAST:  75mL OMNIPAQUE  IOHEXOL  350 MG/ML SOLN COMPARISON:  Portable chest noon today. PET-CT 05/06/2023 FINDINGS: Cardiovascular: Good contrast bolus timing in the pulmonary arterial tree. No pulmonary artery filling defect identified. Calcified aortic atherosclerosis. Heart size remains normal. No pericardial effusion. Right chest Port-A-Cath in place. Mediastinum/Nodes: Lower posterior mediastinal, prevertebral malignant appearing lymphadenopathy (17 mm short axis series 4, image 86) in association with severe spinal metastases, see details below. Other  mediastinal nodal stations remain within normal limits. Lungs/Pleura: Small, trace layering pleural effusions greater on the right. Upper Abdomen: Multiple hypodense liver masses are new compared to a CT abdomen on 06/20/2022, ranging from 10 mm up to 23 mm diameter. Visible spleen, pancreas, right adrenal gland, right kidney, and bowel appear within normal limits. Musculoskeletal: Severe spinal metastatic disease which has substantially progressed since the August PET-CT at that time only small T12 vertebral and left 10th rib lesions. But large lytic, destructive, expansile osseous lesions now occupy the T7 vertebra, costovertebral junction, T8 vertebra (with mild pathologic fracture), 8th rib costochondral junction, and destroyed a the posterior right 8th rib. Bulky tumor there encompasses up to 11 cm long axis (series 4, image 60). And there is evidence of tumor invasion into the thoracic spinal canal at both levels. T8 spinal stenosis is possible (series 4, image 59). Additional left eccentric T12 destructive and expansile metastasis with mild T12 pathologic fracture and spinal canal involvement (series 4, image 95 likely with spinal cord mass effect). Destruction of the left T12 transverse process. Early extension into the left T11 vertebra there. Involvement of the left T12 costovertebral junction. Chronic enlargement and heterogeneity of the distal left clavicle is stable. Review of the MIP images confirms the above findings. IMPRESSION: 1. Severe Thoracic Spinal Metastatic Disease. Marked progression since an August PET-CT with four levels affected (T7, T8, T11, T12) pathologic vertebral fractures, destruction of the posterior right 8th rib, and tumor extension into the spinal canal. Associated prevertebral lymph node metastases. 2. Multifocal liver metastases, new since 2023 and individually up to 2.3 cm. 3. Trace layering pleural effusions. No pulmonary embolus identified. 4.  Aortic Atherosclerosis  (ICD10-I70.0). Salient findings discussed by telephone with PA CHRISTOPHER GROCE on 10/07/2023 at 13:19 . Electronically Signed   By: Marlise Simpers M.D.   On: 10/07/2023 13:23   DG Chest Port 1 View Result Date: 10/07/2023 CLINICAL DATA:  Shortness of breath. EXAM: PORTABLE CHEST 1 VIEW COMPARISON:  Chest x-ray dated September 30, 2023. FINDINGS: Unchanged right chest wall port catheter. The heart size and mediastinal contours are within normal limits. Normal pulmonary vascularity. No focal consolidation, pleural effusion, or pneumothorax. No acute osseous abnormality. IMPRESSION: 1. No active disease. Electronically Signed   By: Aleta Anda M.D.   On: 10/07/2023 12:26   DG Chest Port 1 View Result Date: 09/30/2023 CLINICAL DATA:  Shortness of breath EXAM: PORTABLE CHEST 1 VIEW COMPARISON:  01/25/2023 FINDINGS: Right Port-A-Cath remains in place, unchanged. Heart and mediastinal contours are within normal limits. No focal opacities or effusions. No acute bony abnormality. Old healed left clavicle and rib fractures. IMPRESSION: No active  disease. Electronically Signed   By: Janeece Mechanic M.D.   On: 09/30/2023 11:28    Labs: BNP (last 3 results) Recent Labs    10/21/23 1850  BNP 136.0*   Basic Metabolic Panel: Recent Labs  Lab 10/20/23 0856 10/21/23 1850 10/22/23 0807  NA 135 136 139  K 4.6 5.3* 3.9  CL 106 105 115*  CO2 21* 22 19*  GLUCOSE 147* 186* 85  BUN 44* 39* 30*  CREATININE 0.66 0.77 0.54*  CALCIUM 8.3* 8.4* 6.7*   Liver Function Tests: No results for input(s): "AST", "ALT", "ALKPHOS", "BILITOT", "PROT", "ALBUMIN " in the last 168 hours. No results for input(s): "LIPASE", "AMYLASE" in the last 168 hours. No results for input(s): "AMMONIA" in the last 168 hours. CBC: Recent Labs  Lab 10/20/23 1140 10/21/23 1850 10/22/23 0807  WBC 4.1 3.0* 3.0*  HGB 7.5* 7.0* 7.1*  HCT 23.9* 22.1* 22.9*  MCV 113.3* 112.2* 111.7*  PLT 109* 101* 96*  No results for input(s): "VITAMINB12",  "FOLATE", "FERRITIN", "TIBC", "IRON", "RETICCTPCT" in the last 72 hours. Urinalysis    Component Value Date/Time   COLORURINE YELLOW 10/07/2023 1415   APPEARANCEUR CLEAR 10/07/2023 1415   APPEARANCEUR Clear 11/20/2022 1053   LABSPEC 1.030 10/07/2023 1415   PHURINE 5.0 10/07/2023 1415   GLUCOSEU NEGATIVE 10/07/2023 1415   HGBUR NEGATIVE 10/07/2023 1415   BILIRUBINUR NEGATIVE 10/07/2023 1415   BILIRUBINUR Negative 11/20/2022 1053   KETONESUR NEGATIVE 10/07/2023 1415   PROTEINUR NEGATIVE 10/07/2023 1415   NITRITE NEGATIVE 10/07/2023 1415   LEUKOCYTESUR NEGATIVE 10/07/2023 1415   Sepsis Labs Recent Labs  Lab 10/20/23 1140 10/21/23 1850 10/22/23 0807  WBC 4.1 3.0* 3.0*   Microbiology No results found for this or any previous visit (from the past 240 hours).   Time coordinating discharge: 35 minutes  SIGNED: Lesa Rape, MD  [facility] 10/25/2023, 3:53 PM  If 7PM-7AM, please contact night-coverage [email or web address]

## 2023-10-25 NOTE — Radiation Completion Notes (Addendum)
  Radiation Oncology         [phone] ________________________________  Name: David [name] MRN: [id number]  Date: 10/22/2023  DOB: [birth year]  Referring Physician: BELVIE CLARA, M.D. Date of Service: 2023-10-25 Radiation Oncologist: Signe Nasuti, M.D.  [facility]     RADIATION ONCOLOGY END OF TREATMENT NOTE     Diagnosis: 73 yo man with painful spinal metastases at T7-T8 and T12 from left renal cance   Intent: Palliative     ==========DELIVERED PLANS==========  First Treatment Date: 2023-10-08 Last Treatment Date: 2023-10-22   Plan Name: [id number] Site: Thoracic Spine Technique: 3D Mode: Photon Dose Per Fraction: 3 Gy Prescribed Dose (Delivered / Prescribed): 30 Gy / 30 Gy Prescribed Fxs (Delivered / Prescribed): 10 / 10   Plan Name: Spine_T12 Site: Thoracic Spine Technique: 3D Mode: Photon Dose Per Fraction: 3 Gy Prescribed Dose (Delivered / Prescribed): 30 Gy / 30 Gy Prescribed Fxs (Delivered / Prescribed): 10 / 10     ==========ON TREATMENT VISIT DATES========== 2023-10-14, 2023-10-22   See weekly On Treatment Notes in Epic for details in the Media tab (listed as Progress notes on the On Treatment Visit Dates listed above).  He tolerated the radiation treatments relatively well with only modest fatigue.  The patient will receive a call in about one month from the radiation oncology department. He will continue follow up with his medical oncologist, Dr. Rogers, as well.  ------------------------------------------------   Donnice Barge, MD [facility]  Radiation Oncology Direct Dial: [phone]  Fax: [phone] [email or web address]  Skype  LinkedIn

## 2023-10-25 NOTE — Progress Notes (Deleted)
 PROGRESS NOTE ALVIN SARACCO  XBJ:[id number] DOB: 1951/06/10 DOA: 10/07/2023 PCP: [name], MD  Brief Narrative/Hospital Course:  73 y.o. male smoker with past medical history relevant for history of hep C previously treated with antivirals, HTN, and  Metastatic left kidney clear-cell RCC, s/p Left robotic assisted laparoscopic radical nephrectomy by Dr. [name] on 08/13/2022 with Pathology: at that time showing 7 cm clear-cell RCC with focal sarcomatoid and rhabdoid features, nuclear grade 4.  Tumor extended into the renal vein and renal sinus fat. PET scan (12/24/2022): Enlarging hypermetabolic left adrenal metastasis.  2 small hypermetabolic soft tissue nodules in the left nephrectomy bed.  Hypermetabolic lytic lesions involving distal left clavicle and left temporal bone.  Indeterminate small hypermetabolic parotid nodules bilaterally. 1/23>Transferred from [facility] for radiation oncology treatment and started on froday with plan for 10 rx last being on next Thursday. Palliative care following. At this time plan for SNF once bed available at [facility]. Patient noted to be more short of breath hypoxic needing supplemental oxygen on 2/6 discussed extensively with the patient and his sister at the bedside he agreed for DNR. CT angio chest obtained 2/7> no PE, bibasilar atelectasis question compatible with pneumonia in the posterior left lower lobe difficult to exclude, stable metastatic disease in the left lobe of the liver, metastatic lymphadenopathy in the lower posterior mediastinum and upper abdomen due to peritoneal, multifocal metastatic disease of the skeleton including left clavicle bilateral ribs thoracic spine, inferior left scapula largest mass continues to be at T7 involving T7 vertebral body, right side posterior elements and right posterior seventh rib and this area of soft tissue tumor measures up to 2 cm. Initially patient and family wanted to pursue  SNF but after subsequent  discussion with Dr. Cheree Cords they want to pursue hospice in [facility] and is being evaluated by [name] on 10/25/23.   Subjective: Patient seen and examined Is alert awake resting comfortably  Complains of ongoing pain issues but is alert #1 abdominal surgeries pain meds  Appears anxious Waiting for hospice evaluation today   Assessment and Plan: Principal Problem:   Metastatic cancer to bone [facility]) Active Problems:   Clear cell renal cell carcinoma, left (HCC)   Diarrhea in adult patient   Seizure disorder (HCC)   AKI (acute kidney injury) (HCC)   Malnutrition of moderate degree   Palliative care by specialist   Metastasis to liver [facility])   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 Has undergone therapy per Dr. Katragadda in [place]. Dscussed with Dr. Katragadda on 10/21/23  he has advised hospice   Thoracic metastatic disease with pathologic fractures:  Seen on CTA chest 1/23 with marked progression since an August PET with four levels affected (T7, T8, T11, T12) pathologic vertebral fractures, destruction of the posterior right 8th rib, and tumor extension into the spinal canal. Associated prevertebral lymph node metastases. Multifocal liver metastases, new since 2023 and individually up to 2.3 cm.  Was [facility] from [facility] for XRT, initiated 1/24, planning 10 fractions (final Tx 2/6).  Missed his last treatment but at this time he wants to proceed with hospice.  Continue steroid pain management   Acute hypoxic respiratory failure Shortness of breath/chest pain: Patient reports shortness of breath for several days and has been needing oxygen since 2/6 evening. CT angio chest finding as above  with metastatic disease as above, currently afebrile vital stable and doing well on 2 L nasal  cannula.  Dehydration/lactic acidosis:. Resolved.     Acute diarrhea: Has been having diarrhea in the setting of his chemotherapy. Negative infectious work up. Resolved.   AKI: Due to diarrhea.  Resolved.     Anemia of chronic disease AND malignancy: Hgb stable in high 8's, no bleeding. Monitor intermittently.  Transfuse if less than 7 g.  Planning for hospice at this time Recent Labs  Lab 10/20/23 1140 10/21/23 1850 10/22/23 0807  HGB 7.5* 7.0* 7.1*  HCT 23.9* 22.1* 22.9*   Thrombocytopenia Leukopenia : Patient platelet count hemoglobin as well as WBC count downtrending, on Lovenox , monitor closely Recent Labs  Lab 10/20/23 1140 10/21/23 1850 10/22/23 0807  HGB 7.5* 7.0* 7.1*  HCT 23.9* 22.1* 22.9*  WBC 4.1 3.0* 3.0*  PLT 109* 101* 96*    Constipation:  Regular bowel regimen in place, abd exam benign, and symptoms appear to be stabilizing, will continue at this dose.    Hypertension:  BP stable continue home amlodipine  10.    BPH:  Continue tamsulosin    Insomnia:  Continue remeron  qHS  Moderate malnutrition: Augment diet as below.   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) Signs/Symptoms: moderate fat depletion, moderate muscle depletion, percent weight loss (20.5% loss in 8 months) Percent weight loss: 20.5 % (in 8 months) Interventions: Refer to RD note for recommendations, Boost Plus Goals of care: Overall prognosis does not appear bright, patient wishes to be full code at this time palliative care has seen the patient  Goals of care: Patient with hypoxia, he reports he has been dealing with cancer for a while and feels tired.His sister arrived at the bedside  and all agreed for DNR DNI on 10/21/23 evening.Initially patient and family wanted to pursue  SNF but after subsequent discussion with Dr. Cheree Cords they want to pursue hospice in [facility] and is being evaluated by [name].  DVT prophylaxis: enoxaparin  (LOVENOX ) injection 40 mg Start: 10/14/23 2200 SCDs Start: 10/07/23 1559 Place TED hose Start: 10/07/23 1559 Code Status:   Code Status: Limited: Do not attempt resuscitation (DNR) -DNR-LIMITED -Do Not Intubate/DNI  Family Communication: plan of care discussed with patient at bedside. Patient status is: Remains hospitalized because of severity of illness Level of care: Progressive > discontinue telemetry  Dispo: The patient is from: home            Anticipated disposition: Awaiting hospice at [facility] evaluation  today  Objective: Vitals last 24 hrs: Vitals:   10/24/23 0834 10/24/23 1349 10/25/23 0440 10/25/23 0950  BP: (!) 117/56 103/66 (!) 105/56 115/70  Pulse: 93 (!) 107 98   Resp: 20 20 15    Temp: 98 F (36.7 C) 98.1 F (36.7 C) 98.5 F (36.9 C)   TempSrc: Oral Oral    SpO2:  100% 100%   Weight:      Height:       Weight change:   Physical Examination: General exam: alert awake, anxious mildly dyspneic  HEENT:Oral mucosa moist, Ear/Nose WNL grossly Respiratory system: Bilaterally clear BS,no use of accessory muscle, tender chest wall Cardiovascular system: S1 & S2 +, No JVD. Gastrointestinal system: Abdomen soft,NT,ND, BS+ Nervous System: Alert, awake, moving all extremities,and following commands. Extremities: LE edema neg,distal peripheral pulses palpable and warm.  Skin: No rashes,no icterus. MSK: Normal muscle bulk,tone, power  Medications reviewed:  Scheduled Meds:  amLODipine   10 mg Oral Daily   Chlorhexidine  Gluconate Cloth  6 each Topical Q2200   dexamethasone   6 mg Oral Daily   enoxaparin  (LOVENOX ) injection  40 mg Subcutaneous Q24H    HYDROmorphone  (DILAUDID ) injection  0.5 mg Intravenous Q8H   lactose free nutrition  237 mL Oral BID BM   mirtazapine   7.5 mg Oral QHS   polyethylene glycol  17 g Oral Daily   senna-docusate  2 tablet Oral QHS   sodium chloride  flush  10-40  mL Intracatheter Q12H   sodium chloride  flush  3 mL Intravenous Q12H   sodium chloride  flush  3 mL Intravenous Q12H   tamsulosin   0.4 mg Oral Daily  Continuous Infusions:  sodium chloride       Diet Order             Diet regular Room service appropriate? Yes; Fluid consistency: Thin  Diet effective now                   Intake/Output Summary (Last 24 hours) at 10/25/2023 1051 Last data filed at 10/25/2023 0442 Gross per 24 hour  Intake 360 ml  Output 1450 ml  Net -1090 ml   Net IO Since Admission: -15,635.07 mL [10/25/23 1051]  Wt Readings from Last 3 Encounters:  10/14/23 89.2 kg  09/30/23 89.8 kg  09/06/23 89.8 kg    Unresulted Labs (From admission, onward)    None     Data Reviewed: I have personally reviewed following labs and imaging studies CBC: Recent Labs  Lab 10/20/23 1140 10/21/23 1850 10/22/23 0807  WBC 4.1 3.0* 3.0*  HGB 7.5* 7.0* 7.1*  HCT 23.9* 22.1* 22.9*  MCV 113.3* 112.2* 111.7*  PLT 109* 101* 96*  Basic Metabolic Panel:  Recent Labs  Lab 10/20/23 0856 10/21/23 1850 10/22/23 0807  NA 135 136 139  K 4.6 5.3* 3.9  CL 106 105 115*  CO2 21* 22 19*  GLUCOSE 147* 186* 85  BUN 44* 39* 30*  CREATININE 0.66 0.77 0.54*  CALCIUM 8.3* 8.4* 6.7*  GFR: Estimated Creatinine Clearance: 90.2 mL/min (A) (by C-G formula based on SCr of 0.54 mg/dL (L)). No results found for this or any previous visit (from the past 240 hours).  Antimicrobials/Microbiology: Anti-infectives (From admission, onward)    None         Component Value Date/Time   SDES BLOOD LEFT ARM 10/07/2023 1031   SPECREQUEST  10/07/2023 1031    BOTTLES DRAWN AEROBIC ONLY Blood Culture results may not be optimal due to an inadequate volume of blood received in culture bottles   CULT  10/07/2023 1031    NO GROWTH 5 DAYS Performed at [facility], [address], [place], [place] [address]    REPTSTATUS 10/12/2023 FINAL 10/07/2023 1031  Radiology Studies: No results found.  LOS:  18 days  Total time spent in review of labs and imaging, patient evaluation, formulation of plan, documentation and communication with family: 35 minutes  Lesa Rape, MD  [facility]  10/25/2023, 10:51 AM

## 2023-10-25 NOTE — Plan of Care (Signed)

## 2023-10-25 NOTE — Progress Notes (Signed)
  Daily Progress Note   Patient Name: David [name]       Date: 10/25/2023 DOB: [birth year]  Age: 73 y.o. MRN#: [id number] Attending Physician: Lesa Rape, MD Primary Care Physician: [name], MD Admit Date: 10/07/2023 Length of Stay: 18 days  Reason for Consultation/Follow-up: Establishing goals of care  Subjective:   Subjective:  Appears chronically ill, resting in bed, less alert.    Objective:   Vital Signs:  BP 115/70   Pulse 98   Temp 98.5 F (36.9 C)   Resp 15   Ht 5\' 11"  (1.803 m)   Wt 89.2 kg   SpO2 100%   BMI 27.43 kg/m   I personally reviewed recent imaging.   CXR from 2-6 noted.   Assessment & Plan:   Assessment: Patient is 73 year old male with past medical history of tobacco use, hep C previously treated with antivirals, hypertension, and metastatic left kidney clear-cell RCC status post robotic assisted laparoscopic radical nephrectomy on 08/13/2022 who was transferred from [facility] on 10/07/2023 for patient to receive radiation collagen treatment.  Palliative medicine team consulted for complex medical decision making.  Recommendations/Plan: # Complex medical decision making/goals of care:  - Patient continued discussions with oncology regarding cancer directed therapies and plans to follow-up with them in the outpatient setting.     -  Code Status: Limited: Do not attempt resuscitation (DNR) -DNR-LIMITED -Do Not Intubate/DNI  10-22-23:Goals of care discussions undertaken with patient this am. Patient states that he has a good understanding of the serious irreversible nature of his condition. "They were trying some things with the radiation, but nothing is going to make it better. I think it is time for hospice."  10-25-23: awaiting residential hospice evaluation.    # Symptom management: Patient is receiving these palliative interventions for symptom management with an intent to improve quality of life.   -Pain and dyspnea in the setting of  metastatic clear-cell RCC   Continue as needed oxycodone  5 mg every 4 hours PRN and monitor.  Now on low dose scheduled IV Dilaudid  for pain and dyspnea, patient's symptom burden is noted to be escalating.  # Psychosocial Support:  -chaplain involved   # Discharge Planning:    Awaiting residential hospice evaluation,TOC note reviewed.   Thank you for allowing the palliative care team to participate in the care [facility]a Aurora.  Palliative medicine team will continue to follow along peripherally. Please reach out if acute palliative medicine needs arise.  low MDM Lujean Sake MD Palliative Care Provider PMT # [phone]  If patient remains symptomatic despite maximum doses, please call PMT at [phone] between 0700 and 1900. Outside of these hours, please call attending, as PMT does not have night coverage.

## 2023-10-25 NOTE — TOC Progression Note (Signed)
 Transition of Care [facility]) - Progression Note   Patient Details  Name: David [name] MRN: [id number] Date of Birth: 23-Sep-1950  Transition of Care [facility]) CM/SW Contact  Zenon Hilda, LCSW Phone Number: 10/25/2023, 11:38 AM  Clinical Narrative: CSW spoke with Isabell Manzanilla at [facility] regarding [facility] residential hospice. Per Isabell Manzanilla, the nurse completed her assessment with the patient this morning and the patient has been approved, but there is not an open bed at [facility] at this time. [facility] to notify CSW when a bed becomes available. TOC to follow.    Expected Discharge Plan: Hospice Medical Facility Barriers to Discharge: Continued Medical Work up  Expected Discharge Plan and Services In-house Referral: Clinical Social Work Post Acute Care Choice: Residential Hospice Bed Living arrangements for the past 2 months: Single Family Home             DME Arranged: N/A DME Agency: NA  Social Determinants of Health (SDOH) Interventions SDOH Screenings   Food Insecurity: No Food Insecurity (10/07/2023)  Housing: Low Risk  (10/07/2023)  Transportation Needs: No Transportation Needs (10/07/2023)  Utilities: Not At Risk (10/07/2023)  Depression (PHQ2-9): Low Risk  (01/12/2023)  Financial Resource Strain: Low Risk  (03/24/2023)   Received from [facility]  Recent Concern: Financial Resource Strain - High Risk (02/01/2023)  Social Connections: Unknown (03/07/2023)   Received from [facility]  Stress: Patient Declined (03/07/2023)   Received from [facility]  Tobacco Use: High Risk (10/09/2023)   Readmission Risk Interventions    10/22/2023   12:06 PM  Readmission Risk Prevention Plan  Transportation Screening Complete  PCP or Specialist Appt within 3-5 Days Not Complete  Not Complete comments Patient will discharge to residential hospice.  HRI or Home Care Consult Complete  Social Work Consult for Recovery Care Planning/Counseling Complete  Palliative  Care Screening Complete  Medication Review Ocean[employer]) Complete

## 2023-10-26 ENCOUNTER — Other Ambulatory Visit: Payer: Self-pay

## 2023-10-27 ENCOUNTER — Other Ambulatory Visit: Payer: Self-pay

## 2023-10-29 ENCOUNTER — Other Ambulatory Visit: Payer: Self-pay

## 2023-11-01 ENCOUNTER — Other Ambulatory Visit: Payer: Self-pay

## 2023-11-13 DEATH — deceased

## 2023-11-16 ENCOUNTER — Encounter: Payer: Self-pay | Admitting: Hematology

## 2023-11-23 ENCOUNTER — Ambulatory Visit
Admit: 2023-11-23 | Discharge: 2023-11-23 | Disposition: A | Discharge status: Home / Self Care | Payer: Medicare PPO | Attending: Internal Medicine | Admitting: Internal Medicine

## 2023-11-23 NOTE — Progress Notes (Signed)
  Radiation Oncology         [phone] ________________________________  Name: David [name] MRN: [id number]  Date of Service: 11/23/2023  DOB: [birth year]  Post Treatment Telephone Note  Diagnosis:  C64.2 Malignant neoplasm of left kidney, except renal pelvis (as documented in provider EOT note)  The patient was not available for call today. No voicemail.  The patient is scheduled for ongoing care with Dr. Ronne Binning in medical oncology. The patient was encouraged to call if he  develops concerns or questions regarding radiation.    Ruel Favors, LPN

## 2024-01-26 ENCOUNTER — Other Ambulatory Visit: Payer: Self-pay | Admitting: Pharmacy Technician

## 2024-01-26 ENCOUNTER — Other Ambulatory Visit: Payer: Self-pay

## 2024-01-26 NOTE — Progress Notes (Signed)
 Disenrolled patient deceased
